# Patient Record
Sex: Male | Born: 1945
Health system: Southern US, Community
[De-identification: ages and names within clinical notes are randomized; demographics above are authoritative.]

## PROBLEM LIST (undated history)

## (undated) DIAGNOSIS — N1831 Chronic kidney disease, stage 3a: Secondary | ICD-10-CM

## (undated) DIAGNOSIS — E119 Type 2 diabetes mellitus without complications: Secondary | ICD-10-CM

## (undated) DIAGNOSIS — T7840XA Allergy, unspecified, initial encounter: Secondary | ICD-10-CM

## (undated) DIAGNOSIS — L719 Rosacea, unspecified: Secondary | ICD-10-CM

## (undated) DIAGNOSIS — K635 Polyp of colon: Secondary | ICD-10-CM

## (undated) DIAGNOSIS — Z85828 Personal history of other malignant neoplasm of skin: Secondary | ICD-10-CM

## (undated) DIAGNOSIS — I509 Heart failure, unspecified: Secondary | ICD-10-CM

## (undated) DIAGNOSIS — E785 Hyperlipidemia, unspecified: Secondary | ICD-10-CM

## (undated) DIAGNOSIS — E669 Obesity, unspecified: Secondary | ICD-10-CM

## (undated) DIAGNOSIS — Z8589 Personal history of malignant neoplasm of other organs and systems: Secondary | ICD-10-CM

## (undated) DIAGNOSIS — R000 Tachycardia, unspecified: Secondary | ICD-10-CM

## (undated) DIAGNOSIS — I10 Essential (primary) hypertension: Secondary | ICD-10-CM

## (undated) DIAGNOSIS — K219 Gastro-esophageal reflux disease without esophagitis: Secondary | ICD-10-CM

## (undated) DIAGNOSIS — M1990 Unspecified osteoarthritis, unspecified site: Secondary | ICD-10-CM

## (undated) HISTORY — DX: Tachycardia, unspecified: R00.0

## (undated) HISTORY — PX: KNEE ARTHROSCOPY: SUR90

## (undated) HISTORY — DX: Type 2 diabetes mellitus without complications: E11.9

## (undated) HISTORY — DX: Personal history of malignant neoplasm of other organs and systems: Z85.89

## (undated) HISTORY — DX: Rosacea, unspecified: L71.9

## (undated) HISTORY — DX: Polyp of colon: K63.5

## (undated) HISTORY — DX: Essential (primary) hypertension: I10

## (undated) HISTORY — DX: Hyperlipidemia, unspecified: E78.5

## (undated) HISTORY — PX: TONSILLECTOMY: SUR1361

## (undated) HISTORY — PX: PILONIDAL CYST EXCISION: SHX744

## (undated) HISTORY — PX: TOTAL KNEE ARTHROPLASTY: SHX125

## (undated) HISTORY — DX: Personal history of other malignant neoplasm of skin: Z85.828

## (undated) HISTORY — PX: REPLACEMENT TOTAL KNEE: SUR1224

## (undated) HISTORY — DX: Allergy, unspecified, initial encounter: T78.40XA

## (undated) HISTORY — PX: OTHER SURGICAL HISTORY: SHX169

## (undated) HISTORY — DX: Gastro-esophageal reflux disease without esophagitis: K21.9

## (undated) HISTORY — PX: SHOULDER SURGERY: SHX246

---

## 1999-06-23 ENCOUNTER — Inpatient Hospital Stay (HOSPITAL_COMMUNITY): Admission: EM | Admit: 1999-06-23 | Discharge: 1999-06-25 | Payer: Self-pay | Admitting: *Deleted

## 2001-06-19 ENCOUNTER — Encounter: Payer: Self-pay | Admitting: Internal Medicine

## 2001-06-19 ENCOUNTER — Ambulatory Visit (HOSPITAL_COMMUNITY): Admission: RE | Admit: 2001-06-19 | Discharge: 2001-06-19 | Payer: Self-pay | Admitting: Internal Medicine

## 2001-07-05 ENCOUNTER — Ambulatory Visit (HOSPITAL_BASED_OUTPATIENT_CLINIC_OR_DEPARTMENT_OTHER): Admission: RE | Admit: 2001-07-05 | Discharge: 2001-07-05 | Payer: Self-pay | Admitting: Orthopedic Surgery

## 2003-03-15 ENCOUNTER — Encounter: Admission: RE | Admit: 2003-03-15 | Discharge: 2003-03-15 | Payer: Self-pay | Admitting: Orthopedic Surgery

## 2004-02-10 ENCOUNTER — Ambulatory Visit: Payer: Self-pay | Admitting: Internal Medicine

## 2004-10-18 ENCOUNTER — Ambulatory Visit: Payer: Self-pay | Admitting: Internal Medicine

## 2004-10-25 ENCOUNTER — Ambulatory Visit: Payer: Self-pay | Admitting: Internal Medicine

## 2005-02-10 ENCOUNTER — Ambulatory Visit: Payer: Self-pay | Admitting: Internal Medicine

## 2005-03-17 ENCOUNTER — Ambulatory Visit: Payer: Self-pay | Admitting: Internal Medicine

## 2006-01-02 ENCOUNTER — Ambulatory Visit: Payer: Self-pay | Admitting: Internal Medicine

## 2006-03-12 ENCOUNTER — Ambulatory Visit: Payer: Self-pay | Admitting: Internal Medicine

## 2006-09-18 ENCOUNTER — Telehealth: Payer: Self-pay | Admitting: Internal Medicine

## 2007-02-05 DIAGNOSIS — E8881 Metabolic syndrome: Secondary | ICD-10-CM

## 2007-02-05 DIAGNOSIS — M109 Gout, unspecified: Secondary | ICD-10-CM

## 2007-02-05 DIAGNOSIS — Z9089 Acquired absence of other organs: Secondary | ICD-10-CM | POA: Insufficient documentation

## 2007-02-05 DIAGNOSIS — Z981 Arthrodesis status: Secondary | ICD-10-CM

## 2007-02-05 DIAGNOSIS — S99929A Unspecified injury of unspecified foot, initial encounter: Secondary | ICD-10-CM

## 2007-02-05 DIAGNOSIS — R079 Chest pain, unspecified: Secondary | ICD-10-CM

## 2007-02-05 DIAGNOSIS — K219 Gastro-esophageal reflux disease without esophagitis: Secondary | ICD-10-CM

## 2007-02-05 DIAGNOSIS — S99919A Unspecified injury of unspecified ankle, initial encounter: Secondary | ICD-10-CM

## 2007-02-05 DIAGNOSIS — S8990XA Unspecified injury of unspecified lower leg, initial encounter: Secondary | ICD-10-CM | POA: Insufficient documentation

## 2007-02-05 DIAGNOSIS — E860 Dehydration: Secondary | ICD-10-CM | POA: Insufficient documentation

## 2007-02-13 ENCOUNTER — Telehealth (INDEPENDENT_AMBULATORY_CARE_PROVIDER_SITE_OTHER): Payer: Self-pay | Admitting: *Deleted

## 2007-03-01 ENCOUNTER — Ambulatory Visit: Payer: Self-pay | Admitting: Internal Medicine

## 2007-03-01 DIAGNOSIS — M7660 Achilles tendinitis, unspecified leg: Secondary | ICD-10-CM

## 2007-03-05 ENCOUNTER — Telehealth (INDEPENDENT_AMBULATORY_CARE_PROVIDER_SITE_OTHER): Payer: Self-pay | Admitting: *Deleted

## 2007-03-05 ENCOUNTER — Encounter (INDEPENDENT_AMBULATORY_CARE_PROVIDER_SITE_OTHER): Payer: Self-pay | Admitting: *Deleted

## 2007-07-12 ENCOUNTER — Telehealth: Payer: Self-pay | Admitting: Internal Medicine

## 2007-07-12 ENCOUNTER — Encounter: Payer: Self-pay | Admitting: Internal Medicine

## 2007-07-12 ENCOUNTER — Ambulatory Visit: Payer: Self-pay | Admitting: Internal Medicine

## 2007-07-26 ENCOUNTER — Encounter: Payer: Self-pay | Admitting: Internal Medicine

## 2007-07-26 ENCOUNTER — Ambulatory Visit: Payer: Self-pay | Admitting: Internal Medicine

## 2007-07-26 LAB — HM COLONOSCOPY: HM Colonoscopy: NORMAL

## 2007-07-29 ENCOUNTER — Encounter: Payer: Self-pay | Admitting: Internal Medicine

## 2007-08-08 ENCOUNTER — Encounter: Payer: Self-pay | Admitting: Internal Medicine

## 2007-09-02 ENCOUNTER — Telehealth (INDEPENDENT_AMBULATORY_CARE_PROVIDER_SITE_OTHER): Payer: Self-pay | Admitting: *Deleted

## 2007-10-02 ENCOUNTER — Ambulatory Visit: Payer: Self-pay | Admitting: Internal Medicine

## 2007-10-02 DIAGNOSIS — T300 Burn of unspecified body region, unspecified degree: Secondary | ICD-10-CM | POA: Insufficient documentation

## 2007-10-02 DIAGNOSIS — M545 Low back pain: Secondary | ICD-10-CM | POA: Insufficient documentation

## 2007-10-08 ENCOUNTER — Encounter: Payer: Self-pay | Admitting: Internal Medicine

## 2007-11-05 ENCOUNTER — Ambulatory Visit: Payer: Self-pay | Admitting: Internal Medicine

## 2007-11-05 DIAGNOSIS — Z85828 Personal history of other malignant neoplasm of skin: Secondary | ICD-10-CM

## 2007-11-05 DIAGNOSIS — E785 Hyperlipidemia, unspecified: Secondary | ICD-10-CM | POA: Insufficient documentation

## 2007-11-05 DIAGNOSIS — R740 Nonspecific elevation of levels of transaminase and lactic acid dehydrogenase [LDH]: Secondary | ICD-10-CM

## 2007-11-12 ENCOUNTER — Encounter (INDEPENDENT_AMBULATORY_CARE_PROVIDER_SITE_OTHER): Payer: Self-pay | Admitting: *Deleted

## 2007-11-13 ENCOUNTER — Encounter: Payer: Self-pay | Admitting: Internal Medicine

## 2007-12-13 ENCOUNTER — Telehealth (INDEPENDENT_AMBULATORY_CARE_PROVIDER_SITE_OTHER): Payer: Self-pay | Admitting: *Deleted

## 2007-12-16 ENCOUNTER — Telehealth: Payer: Self-pay | Admitting: Internal Medicine

## 2008-01-13 ENCOUNTER — Encounter: Payer: Self-pay | Admitting: Internal Medicine

## 2008-01-14 ENCOUNTER — Encounter: Payer: Self-pay | Admitting: Internal Medicine

## 2008-02-29 ENCOUNTER — Encounter: Payer: Self-pay | Admitting: Internal Medicine

## 2008-03-11 ENCOUNTER — Telehealth (INDEPENDENT_AMBULATORY_CARE_PROVIDER_SITE_OTHER): Payer: Self-pay | Admitting: *Deleted

## 2008-03-16 ENCOUNTER — Encounter: Payer: Self-pay | Admitting: Internal Medicine

## 2008-04-17 ENCOUNTER — Encounter: Payer: Self-pay | Admitting: Internal Medicine

## 2008-04-20 ENCOUNTER — Encounter (INDEPENDENT_AMBULATORY_CARE_PROVIDER_SITE_OTHER): Payer: Self-pay | Admitting: *Deleted

## 2008-04-20 ENCOUNTER — Telehealth: Payer: Self-pay | Admitting: Internal Medicine

## 2008-04-23 ENCOUNTER — Encounter: Admission: RE | Admit: 2008-04-23 | Discharge: 2008-04-23 | Payer: Self-pay | Admitting: Internal Medicine

## 2008-04-23 ENCOUNTER — Telehealth (INDEPENDENT_AMBULATORY_CARE_PROVIDER_SITE_OTHER): Payer: Self-pay | Admitting: *Deleted

## 2008-04-23 ENCOUNTER — Encounter (INDEPENDENT_AMBULATORY_CARE_PROVIDER_SITE_OTHER): Payer: Self-pay | Admitting: *Deleted

## 2008-05-01 ENCOUNTER — Ambulatory Visit: Payer: Self-pay | Admitting: Internal Medicine

## 2008-05-01 ENCOUNTER — Inpatient Hospital Stay (HOSPITAL_COMMUNITY): Admission: RE | Admit: 2008-05-01 | Discharge: 2008-05-06 | Payer: Self-pay | Admitting: Orthopedic Surgery

## 2008-05-03 ENCOUNTER — Encounter: Payer: Self-pay | Admitting: Internal Medicine

## 2008-05-03 ENCOUNTER — Encounter: Payer: Self-pay | Admitting: Endocrinology

## 2008-05-03 DIAGNOSIS — E871 Hypo-osmolality and hyponatremia: Secondary | ICD-10-CM

## 2008-05-03 DIAGNOSIS — N259 Disorder resulting from impaired renal tubular function, unspecified: Secondary | ICD-10-CM | POA: Insufficient documentation

## 2008-05-05 ENCOUNTER — Encounter (INDEPENDENT_AMBULATORY_CARE_PROVIDER_SITE_OTHER): Payer: Self-pay | Admitting: Orthopedic Surgery

## 2008-05-05 ENCOUNTER — Encounter: Payer: Self-pay | Admitting: Internal Medicine

## 2008-05-12 ENCOUNTER — Ambulatory Visit (HOSPITAL_COMMUNITY): Admission: RE | Admit: 2008-05-12 | Discharge: 2008-05-12 | Payer: Self-pay | Admitting: Orthopedic Surgery

## 2008-05-12 ENCOUNTER — Encounter (INDEPENDENT_AMBULATORY_CARE_PROVIDER_SITE_OTHER): Payer: Self-pay | Admitting: Orthopedic Surgery

## 2008-05-12 ENCOUNTER — Ambulatory Visit: Payer: Self-pay | Admitting: Surgery

## 2008-05-22 ENCOUNTER — Telehealth (INDEPENDENT_AMBULATORY_CARE_PROVIDER_SITE_OTHER): Payer: Self-pay | Admitting: *Deleted

## 2008-05-25 ENCOUNTER — Emergency Department (HOSPITAL_COMMUNITY): Admission: EM | Admit: 2008-05-25 | Discharge: 2008-05-25 | Payer: Self-pay | Admitting: Family Medicine

## 2008-05-25 ENCOUNTER — Ambulatory Visit: Payer: Self-pay | Admitting: Internal Medicine

## 2008-05-25 DIAGNOSIS — D649 Anemia, unspecified: Secondary | ICD-10-CM | POA: Insufficient documentation

## 2008-05-25 DIAGNOSIS — L509 Urticaria, unspecified: Secondary | ICD-10-CM

## 2008-05-26 ENCOUNTER — Emergency Department (HOSPITAL_COMMUNITY): Admission: EM | Admit: 2008-05-26 | Discharge: 2008-05-26 | Payer: Self-pay | Admitting: Emergency Medicine

## 2008-05-26 ENCOUNTER — Telehealth (INDEPENDENT_AMBULATORY_CARE_PROVIDER_SITE_OTHER): Payer: Self-pay | Admitting: *Deleted

## 2008-05-26 ENCOUNTER — Telehealth: Payer: Self-pay | Admitting: Family Medicine

## 2008-05-27 ENCOUNTER — Ambulatory Visit: Payer: Self-pay | Admitting: Internal Medicine

## 2008-05-28 ENCOUNTER — Telehealth (INDEPENDENT_AMBULATORY_CARE_PROVIDER_SITE_OTHER): Payer: Self-pay | Admitting: *Deleted

## 2008-06-06 ENCOUNTER — Telehealth: Payer: Self-pay | Admitting: Family Medicine

## 2008-06-08 ENCOUNTER — Ambulatory Visit: Payer: Self-pay | Admitting: Internal Medicine

## 2008-06-08 DIAGNOSIS — R000 Tachycardia, unspecified: Secondary | ICD-10-CM

## 2008-06-08 DIAGNOSIS — T783XXA Angioneurotic edema, initial encounter: Secondary | ICD-10-CM | POA: Insufficient documentation

## 2008-06-09 ENCOUNTER — Encounter: Payer: Self-pay | Admitting: Internal Medicine

## 2008-06-09 ENCOUNTER — Observation Stay (HOSPITAL_COMMUNITY): Admission: EM | Admit: 2008-06-09 | Discharge: 2008-06-10 | Payer: Self-pay | Admitting: *Deleted

## 2008-06-09 ENCOUNTER — Ambulatory Visit: Payer: Self-pay | Admitting: Internal Medicine

## 2008-06-10 ENCOUNTER — Encounter: Payer: Self-pay | Admitting: Cardiology

## 2008-06-11 ENCOUNTER — Ambulatory Visit: Payer: Self-pay | Admitting: Internal Medicine

## 2008-06-11 LAB — CONVERTED CEMR LAB
Rhuematoid fact SerPl-aCnc: <20 intl units/mL (ref 0.0–20.0)
Uric Acid, Serum: 6 mg/dL (ref 4.0–7.8)

## 2008-06-12 ENCOUNTER — Encounter (INDEPENDENT_AMBULATORY_CARE_PROVIDER_SITE_OTHER): Payer: Self-pay | Admitting: *Deleted

## 2008-06-14 LAB — CONVERTED CEMR LAB
Anti Nuclear Antibody(ANA): NEGATIVE
Compl, Total (CH50): >60 (ref 31–66)

## 2008-06-15 ENCOUNTER — Encounter (INDEPENDENT_AMBULATORY_CARE_PROVIDER_SITE_OTHER): Payer: Self-pay | Admitting: *Deleted

## 2008-06-24 ENCOUNTER — Ambulatory Visit: Payer: Self-pay | Admitting: Internal Medicine

## 2008-06-26 ENCOUNTER — Encounter (INDEPENDENT_AMBULATORY_CARE_PROVIDER_SITE_OTHER): Payer: Self-pay | Admitting: *Deleted

## 2008-06-26 LAB — CONVERTED CEMR LAB
BUN: 11 mg/dL (ref 6–23)
Basophils Absolute: 0 10*3/uL (ref 0.0–0.1)
Eosinophils Absolute: 0.3 10*3/uL (ref 0.0–0.7)
HCT: 37.8 % — ABNORMAL LOW (ref 39.0–52.0)
Lymphocytes Relative: 47 % — ABNORMAL HIGH (ref 12.0–46.0)
Lymphs Abs: 2.6 10*3/uL (ref 0.7–4.0)
Monocytes Relative: 11.7 % (ref 3.0–12.0)
Platelets: 229 10*3/uL (ref 150.0–400.0)
RDW: 13.8 % (ref 11.5–14.6)

## 2008-06-29 ENCOUNTER — Telehealth (INDEPENDENT_AMBULATORY_CARE_PROVIDER_SITE_OTHER): Payer: Self-pay | Admitting: *Deleted

## 2008-06-30 ENCOUNTER — Encounter: Payer: Self-pay | Admitting: Internal Medicine

## 2008-07-02 ENCOUNTER — Ambulatory Visit: Payer: Self-pay | Admitting: Internal Medicine

## 2008-07-02 DIAGNOSIS — I10 Essential (primary) hypertension: Secondary | ICD-10-CM | POA: Insufficient documentation

## 2008-07-03 ENCOUNTER — Telehealth: Payer: Self-pay | Admitting: Internal Medicine

## 2008-07-03 ENCOUNTER — Telehealth (INDEPENDENT_AMBULATORY_CARE_PROVIDER_SITE_OTHER): Payer: Self-pay | Admitting: *Deleted

## 2008-07-06 ENCOUNTER — Encounter: Payer: Self-pay | Admitting: Internal Medicine

## 2008-07-06 ENCOUNTER — Encounter: Payer: Self-pay | Admitting: Cardiology

## 2008-07-08 ENCOUNTER — Encounter: Payer: Self-pay | Admitting: Internal Medicine

## 2008-07-14 ENCOUNTER — Telehealth (INDEPENDENT_AMBULATORY_CARE_PROVIDER_SITE_OTHER): Payer: Self-pay | Admitting: Radiology

## 2008-07-15 ENCOUNTER — Ambulatory Visit: Payer: Self-pay

## 2008-07-16 ENCOUNTER — Encounter: Payer: Self-pay | Admitting: Internal Medicine

## 2008-07-17 ENCOUNTER — Ambulatory Visit: Payer: Self-pay | Admitting: Internal Medicine

## 2008-07-17 DIAGNOSIS — K7689 Other specified diseases of liver: Secondary | ICD-10-CM

## 2008-07-17 DIAGNOSIS — R1310 Dysphagia, unspecified: Secondary | ICD-10-CM

## 2008-07-17 DIAGNOSIS — K222 Esophageal obstruction: Secondary | ICD-10-CM | POA: Insufficient documentation

## 2008-07-20 ENCOUNTER — Ambulatory Visit: Payer: Self-pay | Admitting: Internal Medicine

## 2008-07-21 ENCOUNTER — Encounter: Payer: Self-pay | Admitting: Internal Medicine

## 2008-08-05 ENCOUNTER — Telehealth: Payer: Self-pay | Admitting: Internal Medicine

## 2008-08-10 ENCOUNTER — Ambulatory Visit: Payer: Self-pay | Admitting: Internal Medicine

## 2008-08-13 ENCOUNTER — Encounter (INDEPENDENT_AMBULATORY_CARE_PROVIDER_SITE_OTHER): Payer: Self-pay | Admitting: *Deleted

## 2008-08-13 LAB — CONVERTED CEMR LAB
Basophils Absolute: 0.1 10*3/uL (ref 0.0–0.1)
Eosinophils Absolute: 0.1 10*3/uL (ref 0.0–0.7)
Eosinophils Relative: 2.3 % (ref 0.0–5.0)
MCV: 90.8 fL (ref 78.0–100.0)
Monocytes Absolute: 0.8 10*3/uL (ref 0.1–1.0)
Neutrophils Relative %: 32.4 % — ABNORMAL LOW (ref 43.0–77.0)
Platelets: 212 10*3/uL (ref 150.0–400.0)
RDW: 15.4 % — ABNORMAL HIGH (ref 11.5–14.6)
WBC: 5.7 10*3/uL (ref 4.5–10.5)

## 2008-09-02 ENCOUNTER — Telehealth: Payer: Self-pay | Admitting: Internal Medicine

## 2008-09-03 ENCOUNTER — Encounter: Payer: Self-pay | Admitting: Internal Medicine

## 2008-09-10 ENCOUNTER — Encounter: Payer: Self-pay | Admitting: Internal Medicine

## 2008-11-30 ENCOUNTER — Telehealth: Payer: Self-pay | Admitting: Internal Medicine

## 2009-01-19 ENCOUNTER — Encounter: Payer: Self-pay | Admitting: Internal Medicine

## 2009-05-10 ENCOUNTER — Encounter: Payer: Self-pay | Admitting: Internal Medicine

## 2009-05-17 ENCOUNTER — Telehealth (INDEPENDENT_AMBULATORY_CARE_PROVIDER_SITE_OTHER): Payer: Self-pay | Admitting: *Deleted

## 2009-05-17 ENCOUNTER — Ambulatory Visit: Payer: Self-pay | Admitting: Internal Medicine

## 2009-07-15 ENCOUNTER — Encounter: Payer: Self-pay | Admitting: Internal Medicine

## 2009-07-20 ENCOUNTER — Encounter: Payer: Self-pay | Admitting: Internal Medicine

## 2009-08-05 ENCOUNTER — Ambulatory Visit: Payer: Self-pay | Admitting: Internal Medicine

## 2009-08-05 DIAGNOSIS — R404 Transient alteration of awareness: Secondary | ICD-10-CM

## 2009-08-09 ENCOUNTER — Telehealth (INDEPENDENT_AMBULATORY_CARE_PROVIDER_SITE_OTHER): Payer: Self-pay | Admitting: *Deleted

## 2009-08-09 LAB — CONVERTED CEMR LAB: Hgb A1c MFr Bld: 6.6 % — ABNORMAL HIGH (ref 4.6–6.5)

## 2009-12-04 ENCOUNTER — Emergency Department (HOSPITAL_BASED_OUTPATIENT_CLINIC_OR_DEPARTMENT_OTHER): Admission: EM | Admit: 2009-12-04 | Discharge: 2009-12-05 | Payer: Self-pay | Admitting: Emergency Medicine

## 2009-12-07 ENCOUNTER — Ambulatory Visit: Payer: Self-pay | Admitting: Internal Medicine

## 2009-12-07 ENCOUNTER — Encounter: Payer: Self-pay | Admitting: Internal Medicine

## 2009-12-08 ENCOUNTER — Encounter: Payer: Self-pay | Admitting: Internal Medicine

## 2009-12-09 ENCOUNTER — Telehealth: Payer: Self-pay | Admitting: Internal Medicine

## 2009-12-16 ENCOUNTER — Encounter: Payer: Self-pay | Admitting: Internal Medicine

## 2010-03-06 ENCOUNTER — Encounter: Payer: Self-pay | Admitting: Internal Medicine

## 2010-03-13 LAB — CONVERTED CEMR LAB
ALT: 97 units/L — ABNORMAL HIGH (ref 0–53)
Alkaline Phosphatase: 83 units/L (ref 39–117)
Bilirubin, Direct: 0.2 mg/dL (ref 0.0–0.3)
Total Protein: 7.4 g/dL (ref 6.0–8.3)

## 2010-03-15 NOTE — Progress Notes (Signed)
Summary: Lab results  Phone Note Call from Patient   Caller: Patient Summary of Call: Spoke with patient about recent labs: I reviewed your chart; HCTZ should be stopped altogether for 3 reasons: 1) HCTZ high on list to cause Urticaria, 2) HCTZ can raise uric acid & acute gout risk & 3) it may also  raise sugar. Check uric acid level with followup labs  & see me 2-3 days later. Monitor BOP ooff HCTZ; goal = AVERAGE < 135/85. Hopp (uric acid; 274.9,995.20,790.6)   Patient aware copy of labs to be mailed, patient said his rheumatologist was satisified with his recent Uric Acid level and did not think he needed to f/u on it, so he is going to go with with they said about that. Patient didnt feel it was necessary to recheck Uric Acid level at this time. patient ok'd all other instruction and request rx for metformin be sent to [facility] (done).Shonna Chock  August 09, 2009 12:23 PM

## 2010-03-15 NOTE — Assessment & Plan Note (Signed)
Summary: cpx/fasting//kn   Vital Signs:  Patient profile:   65 year old male Weight:      302.8 pounds BMI:     44.88 Temp:     98.8 degrees F oral Pulse rate:   84 / minute Resp:     15 per minute BP sitting:   130 / 80  (left arm) Cuff size:   large  Vitals Entered By: Shonna Chock CMA (December 07, 2009 9:53 AM) CC: CPX with fasting labs , Heartburn Comments Patient refused flu vaccine [facility] to [facility]   Primary Care Provider:  Marga Melnick  CC:  CPX with fasting labs  and Heartburn.  History of Present Illness: Here for Medicare AWV: 1.Risk factors based on Past M, S, F history:ERD; hyperuricemia/ gout; HTN; Metabolic Syndrome( chart updated) 2.Physical Activities: no exercise since shoulder surgery 3.Depression/mood: no issues 4.Hearing: whisper heard @ 6 ft 5.ADL's: no limitations 6.Fall Risk: staggers occasionally due to R TKR  7.Home Safety: smoke alarms in place; no significant safety issues 8.Height, weight, &visual acuity:wall chart read @ 6 ft with lenses 9.Counseling:POA & Living Will in place  10.Labs ordered based on risk factors:see Orders  11.Referral Coordination: none  requested 12. Care Plan: see Instructions 13.Cognitive Assessment: Oriented X 3; memory & recall  excellent  ; "WORLD" spelled backwards;mood & affect normal. ERD      This is a 65 year old man who  has  PMH of ERD .  The patient reports weight gain of 20#, but denies acid reflux, sour taste in mouth, epigastric pain, chest pain, and trouble swallowing.  The patient reports the following alarm features of dyspepsia: dysphagia( if I don't drink water with meals).  The patient denies the following alarm features: melena, hematemesis, and vomiting.  Symptoms are worse with spicy foods.  Prior evaluation has included EGD with dilation in 2009.  The patient has found the following treatments to be effective: a PPI.  Hypertension Follow-Up      The  patient also presents for Hypertension follow-up.  The patient reports urinary frequency ( with UTI treated @ UC 10/22), but denies lightheadedness, headaches, and fatigue.  Associated symptoms include dyspnea( due to deconditioning), leg edema, and pedal edema.  The patient denies the following associated symptoms: palpitations and syncope.  BP same range as today usually.  Preventive Screening-Counseling & Management  Alcohol-Tobacco     Alcohol drinks/day: 0     Smoking Status: quit > 6 months     Packs/Day: up to 3 ppd     Year Quit: 1975  Caffeine-Diet-Exercise     Caffeine use/day: 2 coffee, 1 tea, rare cola     Diet Comments: none now  Hep-HIV-STD-Contraception     Dental Visit-last 6 months no     Sun Exposure-Excessive: no  Safety-Violence-Falls     Seat Belt Use: yes     Firearms in the Home: firearms in the home     Firearm Counseling: not indicated; uses recommended firearm safety measures      Blood Transfusions:  no.        Travel History:  [place]  2009.    Allergies: 1)  ! Codeine 2)  ! Wellbutrin 3)  ! Zoloft 4)  ! Lexapro (Escitalopram Oxalate) 5)  ! * Metoprolol Succinate  Past History:  Past Medical History: GERD Gout 2003 in [place] ,seen in ER hospitalization for  chest pain,acute 2000, negative  cath hospitalization  for  dehydration 1964 Skin cancer,PMH  of, Basal Cell  & Squamous Cell Hyperlipidemia @ [facility] in  2009 Tachycardia March 2010  Uritcaria post TKR April  2010  Past Surgical History: Tonsillectomy Pilonidal cystectomy  Dysmetabolic syndrome( "pre Diabetes") Total knee replacement & arthroscopy L; 3 arthroscopies R knee Colon polypectomy 2003, 2009 Endo2003,2009 : esophagus  dilated Total knee replacement (04/22/2008)-right  Family History: Father: d 62 RHD Mother: lung cancer d 59 Siblings: bro: d in 28s, CAD;PGM: CAD; M aunt : lung cancer  Social History: Married Alcohol use-no Retired Actor x 5 Smoking  Status:  quit > 6 months Packs/Day:  up to 3 ppd Caffeine use/day:  2 coffee, 1 tea, rare cola Dental Care w/in 6 mos.:  no Sun Exposure-Excessive:  no Seat Belt Use:  yes Blood Transfusions:  no  Review of Systems       The patient complains of prolonged cough and angioedema.  The patient denies anorexia, fever, hemoptysis, suspicious skin lesions, unusual weight change, abnormal bleeding, and enlarged lymph nodes.         NP cough several weeks, worse  in am , resolves through the day. Hematuria with UTI 10/22. Doxepin as needed for intermittent angioedema,Rxed by Dermatologist.  Physical Exam  General:  in no acute distress; alert,appropriate and cooperative throughout examination Head:  Normocephalic and atraumatic without obvious abnormalities. No apparent alopecia or balding. Eyes:  No corneal or conjunctival inflammation noted. Perrla. Funduscopic exam benign, without hemorrhages, exudates or papilledema. Ears:  External ear exam shows no significant lesions or deformities.  Otoscopic examination reveals clear canals, tympanic membranes are intact bilaterally without bulging, retraction, inflammation or discharge. Hearing is grossly normal bilaterally. Nose:  External nasal examination shows no deformity or inflammation. Nasal mucosa are pink and moist without lesions or exudates. Mouth:  Oral mucosa and oropharynx without lesions or exudates.  Teeth in good repair. Oropharyngeal crowding (neg workup for Sleep Apnea) Neck:  No deformities, masses, or tenderness noted. Lungs:  Normal respiratory effort, chest expands symmetrically. Lungs are clear to auscultation, no crackles or wheezes. Heart:  Normal rate and regular rhythm. S1 and S2 normal without gallop, murmur, click, rub or other extra sounds. Abdomen:  Bowel sounds positive,abdomen soft and non-tender without masses, organomegaly or hernias noted. Dullness to percussion RUQ Genitalia:  @ UC 10/22 Prostate:  @ UC by PA Msk:   No deformity or scoliosis noted of thoracic or lumbar spine.   Pulses:  R and L carotid,radial,dorsalis pedis and posterior tibial pulses are full and equal bilaterally Extremities:  No clubbing, cyanosis. Crepitus L knee > R.Trace edma Neurologic:  alert & oriented X3 and DTRs symmetrical but 0+ @ knees Skin:  Mild Rosacea over chin Cervical Nodes:  No lymphadenopathy noted Axillary Nodes:  No palpable lymphadenopathy Psych:  memory intact for recent and remote, normally interactive, and good eye contact.     Impression & Recommendations:  Problem # 1:  PREVENTIVE HEALTH CARE (ICD-V70.0)  Orders: Welcome to [facility], Physical [phone])  Problem # 2:  GERD (ICD-530.81)  His updated medication list for this problem includes:    Omeprazole 20 Mg Cpdr (Omeprazole) .Marland Kitchen... Take 1 p.o. b.i.d.  Problem # 3:  COUGH (ICD-786.2)  secondary to #2  Orders: T-2 View CXR (71020TC)  Problem # 4:  UTI (ICD-599.0) C&S pending  Problem # 5:  DYSPHAGIA UNSPECIFIED (ICD-787.20) due to #2; risks discussed  Problem # 6:  ESSENTIAL HYPERTENSION, BENIGN (ICD-401.1)  controlled His updated medication list for  this problem includes:    Doxazosin Mesylate 4 Mg Tabs (Doxazosin mesylate) .Marland Kitchen... 1 by mouth qd    Metoprolol Tartrate 25 Mg Tabs (Metoprolol tartrate) .Marland Kitchen... 1/2 by mouth once daily  Orders: EKG w/ Interpretation (93000)  Problem # 7:  DYSMETABOLIC SYNDROME X (ICD-277.7)  Complete Medication List: 1)  Doxazosin Mesylate 4 Mg Tabs (Doxazosin mesylate) .Marland Kitchen.. 1 by mouth qd 2)  Viagra 100 Mg Tabs (Sildenafil citrate) .... 1/2 -1 once daily as needed 3)  Cyclobenzaprine Hcl 5 Mg Tabs (Cyclobenzaprine hcl) .... Take 1 tablet by mouth at bedtime as needed 4)  Metoprolol Tartrate 25 Mg Tabs (Metoprolol tartrate) .... 1/2 by mouth once daily 5)  Multivitamins Tabs (Multiple vitamin) .Marland Kitchen.. 1 by mouth once daily 6)  Calcium 600 1500 Mg Tabs (Calcium carbonate) .Marland Kitchen.. 1 by mouth once daily 7)  Fish  Oil 1000 Mg Caps (Omega-3 fatty acids) .Marland Kitchen.. 1 by mouth once daily 8)  Stool Softner  .... As needed 9)  Zyrtec Allergy 10 Mg Tabs (Cetirizine hcl) .Marland Kitchen.. 1 tablet once daily 10)  Allopurinol 300 Mg Tabs (Allopurinol) .Marland Kitchen.. 1 by mouth once daily 11)  Omeprazole 20 Mg Cpdr (Omeprazole) .... Take 1 p.o. b.i.d. 12)  Doxepin Hcl 10 Mg Caps (Doxepin hcl) .Marland Kitchen.. 1 by mouth once daily 13)  Mobic 7.5 Mg Tabs (Meloxicam) .Marland Kitchen.. 1 by mouth once daily 14)  Hydrocodone-? Dose  15)  Metformin Hcl 500 Mg Xr24h-tab (Metformin hcl) .Marland Kitchen.. 1 once daily with largest meal  Patient Instructions: 1)  The following labs are recommended:uric acid; 2)  BMP; 3)  Hepatic Panel ; 4)  Lipid Panel; 5)  TSH ; 6)  CBC w/ Diff; 7)  PSA; 8)  HbgA1C. 9)  Avoid foods high in acid (tomatoes, citrus juices, spicy foods). Avoid eating within two hours of lying down or before exercising. Do not over eat; try smaller more frequent meals. Elevate head of bed twelve inches when sleeping. Report dysphagia if recurrent.   Orders Added: 1)  Welcome to Medicare, Physical [G0402] 2)  Est. Patient Level III [14782] 3)  EKG w/ Interpretation [93000] 4)  T-2 View CXR [71020TC]

## 2010-03-15 NOTE — Assessment & Plan Note (Signed)
Summary: sleeping too much/cbs   Vital Signs:  Patient profile:   65 year old male Weight:      308 pounds Temp:     98.2 degrees F oral Pulse rate:   76 / minute Resp:     16 per minute BP sitting:   124 / 78  (left arm) Cuff size:   large  Vitals Entered By: Shonna Chock (August 05, 2009 2:02 PM) CC: Sleep concerns Comments REVIEWED MED LIST, PATIENT AGREED DOSE AND INSTRUCTION CORRECT    Primary Care Provider:  Marga Melnick  CC:  Sleep concerns.  History of Present Illness: His wife feels he excessively sleepy  in am since TKR in 04/2008. He takes hydrocodone & muscle relaxant qhs; Zyrtec  & Doxepin 10 mg in am. Sleep Apnea evaluation last year: minimal findings, no need for CPAP. No sleep disorder. Non fasting glucose was 131 @ Dr Fatima Sanger office.  Allergies: 1)  ! Codeine 2)  ! Wellbutrin 3)  ! Zoloft 4)  ! Lexapro (Escitalopram Oxalate) 5)  ! * Metoprolol Succinate  Review of Systems General:  Complains of fatigue. Eyes:  Denies blurring, double vision, and vision loss-both eyes. ENT:  Denies difficulty swallowing and hoarseness. CV:  Denies palpitations. Resp:  Denies excessive snoring and morning headaches. GI:  Denies constipation and diarrhea. Derm:  Denies changes in nail beds, dryness, and hair loss. Neuro:  Denies numbness and tingling. Endo:  Denies cold intolerance, excessive hunger, excessive thirst, excessive urination, and heat intolerance.  Physical Exam  General:  alert,appropriate and cooperative throughout examination Head:  Normocephalic and atraumatic without obvious abnormalities. No apparent alopecia  Eyes:  No corneal or conjunctival inflammation noted.No lid lag Mouth:  Oral mucosa and oropharynx without lesions or exudates. Visable oropharynx volume  lower limits of normal Heart:  Normal rate and regular rhythm. S1 and S2 normal without gallop, murmur, click, rub.S4 Neurologic:  DTRs symmetrical  but 0 + @ knees. No tremor  Skin:   Intact without suspicious lesions or rashes Psych:  memory intact for recent and remote and normally interactive.     Impression & Recommendations:  Problem # 1:  SLEEPINESS (ICD-780.09) neg Sleep Apnea W/U  Problem # 2:  HYPERGLYCEMIA (ICD-790.29)  Complete Medication List: 1)  Doxazosin Mesylate 4 Mg Tabs (Doxazosin mesylate) .Marland Kitchen.. 1 by mouth qd 2)  Viagra 100 Mg Tabs (Sildenafil citrate) .... 1/2 -1 once daily as needed 3)  Cyclobenzaprine Hcl 5 Mg Tabs (Cyclobenzaprine hcl) .... Take 1 tablet by mouth at bedtime as needed 4)  Metoprolol Tartrate 25 Mg Tabs (Metoprolol tartrate) .... 1/2 by mouth once daily 5)  Multivitamins Tabs (Multiple vitamin) .Marland Kitchen.. 1 by mouth once daily 6)  Calcium 600 1500 Mg Tabs (Calcium carbonate) .Marland Kitchen.. 1 by mouth once daily 7)  Fish Oil 1000 Mg Caps (Omega-3 fatty acids) .Marland Kitchen.. 1 by mouth once daily 8)  Stool Softner  .... As needed 9)  Zyrtec Allergy 10 Mg Tabs (Cetirizine hcl) .Marland Kitchen.. 1 tablet once daily 10)  Allopurinol 300 Mg Tabs (Allopurinol) .Marland Kitchen.. 1 by mouth once daily 11)  Hydrochlorothiazide 25 Mg Tabs (Hydrochlorothiazide) .... Take one tablet by mouth daily. 12)  Omeprazole 20 Mg Cpdr (Omeprazole) .... Take 1 p.o. b.i.d. 13)  Doxepin Hcl 10 Mg Caps (Doxepin hcl) .Marland Kitchen.. 1 by mouth once daily 14)  Mobic 7.5 Mg Tabs (Meloxicam) .Marland Kitchen.. 1 by mouth once daily 15)  Hydrocodone-? Dose   Patient Instructions: 1)  Stop muscle relaxant at bedtime . Change  Zyrtec to at bedtime   Appended Document: sleeping too much/cbs

## 2010-03-15 NOTE — Miscellaneous (Signed)
Summary: Orders Update   Clinical Lists Changes  Orders: Added new Service order of Venipuncture (36415) - Signed Added new Test order of TLB-A1C / Hgb A1C (Glycohemoglobin) (83036-A1C) - Signed 

## 2010-03-15 NOTE — Progress Notes (Signed)
Summary: DX concerns   Phone Note Call from Patient Call back at Home Phone [phone]   Caller: Spouse: Aurea Graff Summary of Call: [facility] [place] and the doctor there had a question about printout given to patient. ? Renal disease, patient not aware that he had kidney disease.  Dr.Lakayla Barrington please advise./Chrae Marlynn Perking CMA  December 09, 2009 10:12 AM   Follow-up for Phone Call        I recrded that he told me he had recently been found to have UTI . He needs to get C&S from [facility] facility where treated.They found blood in urine by history Follow-up by: Marga Melnick MD,  December 09, 2009 11:28 AM  Additional Follow-up for Phone Call Additional follow up Details #1::        Patient would like to know what the term ERD is and why it is in his chart.  Patient said the history of blood in his urine is an old dx and not present x a long time. Additional Follow-up by: Shonna Chock CMA,  December 09, 2009 11:41 AM    Additional Follow-up for Phone Call Additional follow up Details #2::    see ROS in CPX ; I recorded what he told me about seeing PA 10/22. He should verify the final results from the UC as to whether UTI present or not Follow-up by: Marga Melnick MD,  December 09, 2009 12:33 PM  Additional Follow-up for Phone Call Additional follow up Details #3:: Details for Additional Follow-up Action Taken: Pt is aware.  Additional Follow-up by: Army Fossa CMA,  December 09, 2009 2:23 PM

## 2010-03-15 NOTE — Letter (Signed)
Summary: [facility]  [facility]   Imported By: Lanelle Bal 05/18/2009 09:45:17  _____________________________________________________________________  External Attachment:    Type:   Image     Comment:   External Document

## 2010-03-15 NOTE — Assessment & Plan Note (Signed)
Summary: cellutiltis  elbow/alr pt aware working in /alr   Vital Signs:  Patient profile:   65 year old male Weight:      298 pounds BMI:     44.17 Temp:     98.3 degrees F oral Pulse rate:   80 / minute Resp:     17 per minute BP sitting:   130 / 84  (left arm) Cuff size:   large  Vitals Entered By: Shonna Chock (May 17, 2009 3:09 PM) CC: Cellulitis-Right Elbow Comments REVIEWED MED LIST, PATIENT AGREED DOSE AND INSTRUCTION CORRECT    Primary Care Provider:  Marga Melnick  CC:  Cellulitis-Right Elbow.  History of Present Illness: Pain & swelling RUE  from shouldr to elbow , both of which which improved with" steroid pack" completed 05/10/2009.Gout was diagnosed by Dr Fatima Sanger PA . Swelling has persisted @ elbow ; cellulitis diagnosed @ [facility] 04/03. Films normal. Keflex Rxed ; redness spreading but decreased temp in  RUE.  Allergies: 1)  ! Codeine 2)  ! Wellbutrin 3)  ! Zoloft 4)  ! Lexapro (Escitalopram Oxalate) 5)  ! * Metoprolol Succinate  Review of Systems General:  Denies chills, fever, and sweats. CV:  Denies chest pain or discomfort. Resp:  Denies shortness of breath. MS:  Complains of joint pain, joint redness, and joint swelling.  Physical Exam  General:  in no acute distress; appropriate and cooperative throughout examination; appears mildly uncomfortable-appearing.   Extremities:  Mild erythema & edema  @  R olecranon area with some blanching to direct pressure;tender to palpation; w/o increased temp  Skin:  Faint erythema R elbow/ forearm  essentially within ink outline from UC ;w/o increased temperature. Blanching over edema @ R olecranon process.  Cervical Nodes:  No lymphadenopathy noted Axillary Nodes:  No palpable lymphadenopathy; no epitrochlear LA    Impression & Recommendations:  Problem # 1:  CELLULITIS (ICD-682.9) Clinically improved;R/O underlying  gout  His updated medication list for this problem includes:    Keflex 500 Mg Caps  (Cephalexin) .Marland KitchenMarland KitchenMarland KitchenMarland Kitchen 4 x once daily x 9 days  Complete Medication List: 1)  Doxazosin Mesylate 4 Mg Tabs (Doxazosin mesylate) .Marland Kitchen.. 1 by mouth qd 2)  Viagra 100 Mg Tabs (Sildenafil citrate) .... 1/2 -1 once daily as needed 3)  Cyclobenzaprine Hcl 5 Mg Tabs (Cyclobenzaprine hcl) .... Take 1 tablet by mouth at bedtime as needed 4)  Metoprolol Tartrate 25 Mg Tabs (Metoprolol tartrate) .... Take one tablet two times a day 5)  Multivitamins Tabs (Multiple vitamin) .Marland Kitchen.. 1 by mouth once daily 6)  Calcium 600 1500 Mg Tabs (Calcium carbonate) .Marland Kitchen.. 1 by mouth once daily 7)  Fish Oil 1000 Mg Caps (Omega-3 fatty acids) .Marland Kitchen.. 1 by mouth once daily 8)  Stool Softner  .... As needed 9)  Oxycodone-acetaminophen 5-325 Mg Tabs (Oxycodone-acetaminophen) .Marland Kitchen.. 1-2 by mouth every four hours as needed 10)  Zyrtec Allergy 10 Mg Tabs (Cetirizine hcl) .Marland Kitchen.. 1 tablet once daily 11)  Allopurinol 300 Mg Tabs (Allopurinol) .Marland Kitchen.. 1 by mouth once daily 12)  Hydrochlorothiazide 25 Mg Tabs (Hydrochlorothiazide) .... Take one tablet by mouth daily. 13)  Omeprazole 20 Mg Cpdr (Omeprazole) .... Take 1 p.o. b.i.d. 14)  Doxepin Hcl 10 Mg Caps (Doxepin hcl) .Marland Kitchen.. 1 by mouth two times a day 15)  Keflex 500 Mg Caps (Cephalexin) .... 4 x once daily x 9 days  Patient Instructions: 1)  Monitor temp ; keep a diary of recordings.Elevate R arm as much as possible &  use warm , moist compresses three times a day as needed . Report Warning Signs  of fever , pain, increased swelling. Aspiration of joint if these appear.Complete full course of Keflex.

## 2010-03-15 NOTE — Letter (Signed)
Summary: [facility]  [facility]   Imported By: Lanelle Bal 07/30/2009 11:13:37  _____________________________________________________________________  External Attachment:    Type:   Image     Comment:   External Document

## 2010-03-15 NOTE — Progress Notes (Signed)
Summary: PHONE-CELLITUS  Phone Note Call from Patient Call back at Home Phone [phone]   Caller: Spouse Summary of Call: PATIENT WIFE STATES PATIENT HAS CELLIUTIS AND NEEDS TO BE SEEN TODAY. PATIENT WIFE SAID SHE SPOKE WITH DR. Kathyrn Sheriff AND SHE STATED HE NEEDS TO BE SEEN TODAY. PATIENT [facility] AND THEY SAID HE NEEDS TO BE SEEN. Initial call taken by: Barb Merino,  May 17, 2009 8:57 AM  Follow-up for Phone Call        Spoke with pt wife, pt was seen by Dr Corliss Skains over the weekend. C/o pain elbow  which got very red , warm to the touch.  Dx with cellulitis given Keflex 500mg , and recommended to f/u with pcp. Follow-up by: Kandice Hams,  May 17, 2009 9:38 AM

## 2010-03-18 NOTE — Letter (Signed)
Summary: [facility]  [facility]   Imported By: Lanelle Bal 01/04/2010 09:13:47  _____________________________________________________________________  External Attachment:    Type:   Image     Comment:   External Document

## 2010-04-01 ENCOUNTER — Telehealth (INDEPENDENT_AMBULATORY_CARE_PROVIDER_SITE_OTHER): Payer: Self-pay | Admitting: *Deleted

## 2010-04-06 NOTE — Progress Notes (Signed)
Summary: Metformin rx  Phone Note Other Incoming   Summary of Call: [facility] Metofrmin [facility] to them so that the pt can have his rx filled through them. Please fax to [phone]. Initial call taken by: Army Fossa CMA,  April 01, 2010 1:48 PM  Follow-up for Phone Call        Last labs check was 07/2009, patient was to recheck labs in 8-10 weeks (never rechecked)   RX faxed to [phone]. Follow-up by: Shonna Chock CMA,  April 01, 2010 4:11 PM    New/Updated Medications: METFORMIN HCL 500 MG XR24H-TAB (METFORMIN HCL) 1 once daily WITH largest meal **LABS DUE** Prescriptions: METFORMIN HCL 500 MG XR24H-TAB (METFORMIN HCL) 1 once daily WITH largest meal **LABS DUE**  #60 x 0   Entered by:   Shonna Chock CMA   Authorized by:   Marga Melnick MD   Signed by:   Shonna Chock CMA on 04/01/2010   Method used:   Print then Give to Patient   RxID:   [id number]

## 2010-04-26 ENCOUNTER — Encounter: Payer: Self-pay | Admitting: Internal Medicine

## 2010-04-26 ENCOUNTER — Ambulatory Visit (INDEPENDENT_AMBULATORY_CARE_PROVIDER_SITE_OTHER): Payer: Medicare Other | Admitting: Internal Medicine

## 2010-04-26 ENCOUNTER — Other Ambulatory Visit: Payer: Self-pay | Admitting: Internal Medicine

## 2010-04-26 DIAGNOSIS — R609 Edema, unspecified: Secondary | ICD-10-CM

## 2010-04-26 DIAGNOSIS — R7309 Other abnormal glucose: Secondary | ICD-10-CM

## 2010-04-26 DIAGNOSIS — R5381 Other malaise: Secondary | ICD-10-CM

## 2010-04-26 DIAGNOSIS — R5383 Other fatigue: Secondary | ICD-10-CM

## 2010-04-26 DIAGNOSIS — R0609 Other forms of dyspnea: Secondary | ICD-10-CM

## 2010-04-26 DIAGNOSIS — R0989 Other specified symptoms and signs involving the circulatory and respiratory systems: Secondary | ICD-10-CM | POA: Insufficient documentation

## 2010-04-26 LAB — T4, FREE: Free T4: 0.92 ng/dL (ref 0.60–1.60)

## 2010-04-26 LAB — TSH: TSH: 4.43 u[IU]/mL (ref 0.35–5.50)

## 2010-04-27 LAB — URINALYSIS, ROUTINE W REFLEX MICROSCOPIC
Bilirubin Urine: NEGATIVE
Glucose, UA: NEGATIVE mg/dL
Ketones, ur: 15 mg/dL — AB
Specific Gravity, Urine: 1.021 (ref 1.005–1.030)
pH: 6 (ref 5.0–8.0)

## 2010-04-27 LAB — URINE MICROSCOPIC-ADD ON

## 2010-04-27 LAB — URINE CULTURE: Culture  Setup Time: 201110231158

## 2010-05-03 NOTE — Assessment & Plan Note (Signed)
Summary: talk about blood sugar rising///sph   Vital Signs:  Patient profile:   65 year old male Weight:      302 pounds BMI:     44.76 Pulse rate:   80 / minute Resp:     16 per minute BP sitting:   142 / 82  (left arm) Cuff size:   large  Vitals Entered By: Shonna Chock CMA (April 26, 2010 9:25 AM) CC: 1.) Discuss Labs: patient states he had 2 meals the day he had labs (NOT FASTING)  2.) Discuss Metoprolol-? instruction  3.) Fatigue, Type 2 diabetes mellitus follow-up   Primary Care Provider:  Marga Melnick  CC:  1.) Discuss Labs: patient states he had 2 meals the day he had labs (NOT FASTING)  2.) Discuss Metoprolol-? instruction  3.) Fatigue and Type 2 diabetes mellitus follow-up.  History of Present Illness:    Onset of fatigue was 3 months ago ; ? due to Metformin. He  reports persistent fatigue and fatigue even without physical activity, but denies polyuria.  The patient also reports dyspnea.  The patient denies fever, night sweats, weight loss, exertional chest pain, cough, and hemoptysis.  Other symptoms include leg swelling(support hose worn), orthopnea, PND rarely ( no PMH of asthma) , daytime sleepiness, and skin changes ( chronically dry).  The patient denies the following symptoms: melena, adenopathy, and severe snoring( Sleep Apnea study  was neg 2010). He has  poor sleep due to joint pain.  The patient denies feeling depressed and altered appetite.  Labs 03/08:normal CBC & dif; AST 41(<37); glucose 226. No TSH performed. .    Glucose  was non fasting glucose ( @ Dr Fatima Sanger office) He  reports polyuria, but denies polydipsia ( but dry mouth), blurred vision, and numbness of extremities.  Other symptoms include occasional orthostatic symptoms.  The patient denies the following symptoms: neuropathic pain, vomiting, poor wound healing, vision loss, and foot ulcer.  Since the last visit the patient reports good dietary compliance, not exercising regularly due to arthritis, and  not monitoring blood glucose.    Current Medications (verified): 1)  Doxazosin Mesylate 4 Mg Tabs (Doxazosin Mesylate) .Marland Kitchen.. 1 By Mouth Qd 2)  Viagra 100 Mg Tabs (Sildenafil Citrate) .... 1/2 -1 Once Daily As Needed 3)  Cyclobenzaprine Hcl 5 Mg Tabs (Cyclobenzaprine Hcl) .... Take 1 Tablet By Mouth At Bedtime As Needed 4)  Metoprolol Tartrate 25 Mg Tabs (Metoprolol Tartrate) .... 1/2 By Mouth Two Times A Day 5)  Multivitamins  Tabs (Multiple Vitamin) .Marland Kitchen.. 1 By Mouth Once Daily 6)  Calcium 600 1500 Mg Tabs (Calcium Carbonate) .Marland Kitchen.. 1 By Mouth Once Daily 7)  Fish Oil 1000 Mg Caps (Omega-3 Fatty Acids) .Marland Kitchen.. 1 By Mouth Once Daily 8)  Zyrtec Allergy 10 Mg Tabs (Cetirizine Hcl) .Marland Kitchen.. 1 Tablet Once Daily 9)  Allopurinol 300 Mg Tabs (Allopurinol) .Marland Kitchen.. 1 By Mouth Two Times A Day 10)  Omeprazole 20 Mg Cpdr (Omeprazole) .Marland Kitchen.. 1-2 By Mouth Once Daily 11)  Mobic 7.5 Mg Tabs (Meloxicam) .Marland Kitchen.. 1 By Mouth Once Daily 12)  Hydrocodone-? Dose 13)  Metformin Hcl 500 Mg Xr24h-Tab (Metformin Hcl) .Marland Kitchen.. 1 Once Daily With Largest Meal **labs Due** 14)  Spirulina  Powd (Spirulina) .Marland Kitchen.. 1 Teaspoon Daily  Allergies: 1)  ! Codeine 2)  ! Wellbutrin 3)  ! Zoloft 4)  ! Lexapro (Escitalopram Oxalate) 5)  ! * Metoprolol Succinate  Physical Exam  General:  in no acute distress; alert,appropriate and cooperative throughout examination; weight  excess Eyes:  No corneal or conjunctival inflammation noted. EOMI. Perrla.No lid lag Neck:  No deformities, masses, or tenderness noted. Lungs:  Normal respiratory effort, chest expands symmetrically. Lungs are clear to auscultation, no crackles or wheezes. Heart:  Normal rate and regular rhythm. S1 and S2 normal without gallop, murmur, click, rub . Distant heart sounds Abdomen:  Bowel sounds positive,abdomen soft and non-tender without masses, organomegaly or hernias noted. Protuberant ; dullness RUQ Pulses:  R and L carotid,radial,dorsalis pedis and posterior tibial pulses are full  and equal bilaterally Extremities:  No clubbing, cyanosis, edema(wearing support hose)  Neurologic:  alert & oriented X3 and DTRs symmetrical but 0-1/2+. No tremor Skin:  Intact without suspicious lesions or rashes Cervical Nodes:  No lymphadenopathy noted Axillary Nodes:  No palpable lymphadenopathy Psych:  memory intact for recent and remote, normally interactive, and good eye contact.     Impression & Recommendations:  Problem # 1:  FATIGUE (ICD-780.79)  Orders: Venipuncture (62694) TLB-TSH (Thyroid Stimulating Hormone) (84443-TSH) TLB-T4 (Thyrox), Free [phone]) Specimen Handling (00938)  Problem # 2:  HYPERGLYCEMIA (ICD-790.29)  His updated medication list for this problem includes:    Metformin Hcl 500 Mg Xr24h-tab (Metformin hcl) .Marland Kitchen... 1 once daily with largest meal  Orders: Venipuncture (18299) TLB-A1C / Hgb A1C (Glycohemoglobin) (83036-A1C) Specimen Handling (37169)  Problem # 3:  PAROXYSMAL NOCTURNAL DYSPNEA (ICD-786.09)  His updated medication list for this problem includes:    Metoprolol Tartrate 25 Mg Tabs (Metoprolol tartrate) .Marland Kitchen... 1/2 by mouth two times a day  Orders: Venipuncture (67893) TLB-BNP (B-Natriuretic Peptide) (83880-BNPR)  Problem # 4:  EDEMA (ICD-782.3)  Orders: Venipuncture (81017) TLB-BNP (B-Natriuretic Peptide) (83880-BNPR) Specimen Handling (51025)  Complete Medication List: 1)  Doxazosin Mesylate 4 Mg Tabs (Doxazosin mesylate) .Marland Kitchen.. 1 by mouth qd 2)  Viagra 100 Mg Tabs (Sildenafil citrate) .... 1/2 -1 once daily as needed 3)  Cyclobenzaprine Hcl 5 Mg Tabs (Cyclobenzaprine hcl) .... Take 1 tablet by mouth at bedtime as needed 4)  Metoprolol Tartrate 25 Mg Tabs (Metoprolol tartrate) .... 1/2 by mouth two times a day 5)  Multivitamins Tabs (Multiple vitamin) .Marland Kitchen.. 1 by mouth once daily 6)  Calcium 600 1500 Mg Tabs (Calcium carbonate) .Marland Kitchen.. 1 by mouth once daily 7)  Fish Oil 1000 Mg Caps (Omega-3 fatty acids) .Marland Kitchen.. 1 by mouth once  daily 8)  Zyrtec Allergy 10 Mg Tabs (Cetirizine hcl) .Marland Kitchen.. 1 tablet once daily 9)  Allopurinol 300 Mg Tabs (Allopurinol) .Marland Kitchen.. 1 by mouth two times a day 10)  Omeprazole 20 Mg Cpdr (Omeprazole) .Marland Kitchen.. 1-2 by mouth once daily 11)  Mobic 7.5 Mg Tabs (Meloxicam) .Marland Kitchen.. 1 by mouth once daily 12)  Hydrocodone-? Dose  13)  Metformin Hcl 500 Mg Xr24h-tab (Metformin hcl) .Marland Kitchen.. 1 once daily with largest meal **labs due** 14)  Spirulina Powd (Spirulina) .Marland Kitchen.. 1 teaspoon daily  Patient Instructions: 1)  If A1c is in Diabetic range; glucose monitoring  will be necessary. 2)  Limit your Sodium (Salt) to less than 4 grams a day (slightly less than 1 teaspoon) to prevent fluid retention, swelling, or worsening or symptoms. Echocardiogram will be ordered if BNP is elevated as we discussed.   Orders Added: 1)  Est. Patient Level IV [85277] 2)  Venipuncture [36415] 3)  TLB-TSH (Thyroid Stimulating Hormone) [84443-TSH] 4)  TLB-A1C / Hgb A1C (Glycohemoglobin) [83036-A1C] 5)  TLB-BNP (B-Natriuretic Peptide) [83880-BNPR] 6)  TLB-T4 (Thyrox), Free [82423-NT6R] 7)  Specimen Handling [99000]

## 2010-05-04 ENCOUNTER — Encounter: Payer: Self-pay | Admitting: Internal Medicine

## 2010-05-04 ENCOUNTER — Ambulatory Visit (INDEPENDENT_AMBULATORY_CARE_PROVIDER_SITE_OTHER): Payer: Medicare Other | Admitting: *Deleted

## 2010-05-04 MED ORDER — GLUCOSE BLOOD VI STRP: ORAL_STRIP | Status: DC

## 2010-05-04 MED ORDER — ONETOUCH DELICA LANCETS MISC: Status: DC

## 2010-05-05 ENCOUNTER — Encounter: Payer: Self-pay | Admitting: Internal Medicine

## 2010-05-25 LAB — CBC
HCT: 39.1 % (ref 39.0–52.0)
Hemoglobin: 13 g/dL (ref 13.0–17.0)
MCHC: 33.2 g/dL (ref 30.0–36.0)
MCV: 94.4 fL (ref 78.0–100.0)
RDW: 14.6 % (ref 11.5–15.5)

## 2010-05-25 LAB — DIFFERENTIAL
Eosinophils Relative: 2 % (ref 0–5)
Lymphocytes Relative: 34 % (ref 12–46)
Lymphs Abs: 3.6 10*3/uL (ref 0.7–4.0)
Monocytes Absolute: 1.2 10*3/uL — ABNORMAL HIGH (ref 0.1–1.0)
Monocytes Relative: 12 % (ref 3–12)

## 2010-05-25 LAB — POCT I-STAT, CHEM 8
Calcium, Ion: 1.1 mmol/L — ABNORMAL LOW (ref 1.12–1.32)
Glucose, Bld: 132 mg/dL — ABNORMAL HIGH (ref 70–99)
HCT: 40 % (ref 39.0–52.0)
Hemoglobin: 13.6 g/dL (ref 13.0–17.0)
Potassium: 4.2 mEq/L (ref 3.5–5.1)

## 2010-05-25 LAB — PROTIME-INR: Prothrombin Time: 15.1 seconds (ref 11.6–15.2)

## 2010-05-25 LAB — CARDIAC PANEL(CRET KIN+CKTOT+MB+TROPI)
CK, MB: 0.5 ng/mL (ref 0.3–4.0)
Relative Index: INVALID (ref 0.0–2.5)
Total CK: 63 U/L (ref 7–232)
Total CK: 67 U/L (ref 7–232)

## 2010-05-25 LAB — URINALYSIS, ROUTINE W REFLEX MICROSCOPIC
Bilirubin Urine: NEGATIVE
Nitrite: NEGATIVE
Specific Gravity, Urine: 1.017 (ref 1.005–1.030)
Urobilinogen, UA: 1 mg/dL (ref 0.0–1.0)

## 2010-05-25 LAB — BASIC METABOLIC PANEL
BUN: 10 mg/dL (ref 6–23)
Creatinine, Ser: 1.06 mg/dL (ref 0.4–1.5)
GFR calc non Af Amer: >60 mL/min (ref >60)
Glucose, Bld: 131 mg/dL — ABNORMAL HIGH (ref 70–99)

## 2010-05-25 LAB — POCT CARDIAC MARKERS: CKMB, poc: <1 ng/mL — ABNORMAL LOW (ref 1.0–8.0)

## 2010-05-25 LAB — CK TOTAL AND CKMB (NOT AT ARMC): CK, MB: 0.4 ng/mL (ref 0.3–4.0)

## 2010-05-25 LAB — TROPONIN I: Troponin I: <0.01 ng/mL (ref 0.00–0.06)

## 2010-05-25 LAB — D-DIMER, QUANTITATIVE: D-Dimer, Quant: 1.34 ug/mL-FEU — ABNORMAL HIGH (ref 0.00–0.48)

## 2010-05-26 ENCOUNTER — Telehealth: Payer: Self-pay | Admitting: Internal Medicine

## 2010-05-26 LAB — DIFFERENTIAL
Basophils Absolute: 0 10*3/uL (ref 0.0–0.1)
Basophils Absolute: 0 10*3/uL (ref 0.0–0.1)
Basophils Relative: 1 % (ref 0–1)
Eosinophils Absolute: 0.1 10*3/uL (ref 0.0–0.7)
Eosinophils Absolute: 0.2 10*3/uL (ref 0.0–0.7)
Eosinophils Relative: 2 % (ref 0–5)
Lymphocytes Relative: 31 % (ref 12–46)
Lymphs Abs: 1.8 10*3/uL (ref 0.7–4.0)
Monocytes Absolute: 0.9 10*3/uL (ref 0.1–1.0)
Neutrophils Relative %: 47 % (ref 43–77)

## 2010-05-26 LAB — BASIC METABOLIC PANEL
BUN: 11 mg/dL (ref 6–23)
BUN: 11 mg/dL (ref 6–23)
CO2: 22 mEq/L (ref 19–32)
CO2: 24 mEq/L (ref 19–32)
Calcium: 8 mg/dL — ABNORMAL LOW (ref 8.4–10.5)
Calcium: 8 mg/dL — ABNORMAL LOW (ref 8.4–10.5)
Chloride: 106 mEq/L (ref 96–112)
Chloride: 107 mEq/L (ref 96–112)
Creatinine, Ser: 1.02 mg/dL (ref 0.4–1.5)
Creatinine, Ser: 1.05 mg/dL (ref 0.4–1.5)
Creatinine, Ser: 1.42 mg/dL (ref 0.4–1.5)
Creatinine, Ser: 1.73 mg/dL — ABNORMAL HIGH (ref 0.4–1.5)
Creatinine, Ser: 2.73 mg/dL — ABNORMAL HIGH (ref 0.4–1.5)
GFR calc Af Amer: 28 mL/min — ABNORMAL LOW (ref >60)
GFR calc Af Amer: >60 mL/min (ref >60)
GFR calc Af Amer: >60 mL/min (ref >60)
GFR calc non Af Amer: 24 mL/min — ABNORMAL LOW (ref >60)
GFR calc non Af Amer: >60 mL/min (ref >60)
GFR calc non Af Amer: >60 mL/min (ref >60)
Glucose, Bld: 120 mg/dL — ABNORMAL HIGH (ref 70–99)
Glucose, Bld: 127 mg/dL — ABNORMAL HIGH (ref 70–99)
Potassium: 3.4 mEq/L — ABNORMAL LOW (ref 3.5–5.1)
Potassium: 4.4 mEq/L (ref 3.5–5.1)
Sodium: 129 mEq/L — ABNORMAL LOW (ref 135–145)
Sodium: 135 mEq/L (ref 135–145)

## 2010-05-26 LAB — CBC
HCT: 44.2 % (ref 39.0–52.0)
Hemoglobin: 10.8 g/dL — ABNORMAL LOW (ref 13.0–17.0)
Hemoglobin: 12.1 g/dL — ABNORMAL LOW (ref 13.0–17.0)
MCHC: 35.1 g/dL (ref 30.0–36.0)
MCV: 94.2 fL (ref 78.0–100.0)
Platelets: 155 10*3/uL (ref 150–400)
Platelets: 196 10*3/uL (ref 150–400)
RBC: 3 MIL/uL — ABNORMAL LOW (ref 4.22–5.81)
RBC: 3 MIL/uL — ABNORMAL LOW (ref 4.22–5.81)
RBC: 4.69 MIL/uL (ref 4.22–5.81)
RDW: 14.7 % (ref 11.5–15.5)
RDW: 15 % (ref 11.5–15.5)
RDW: 15.1 % (ref 11.5–15.5)
RDW: 15.2 % (ref 11.5–15.5)
WBC: 5.7 10*3/uL (ref 4.0–10.5)
WBC: 6.2 10*3/uL (ref 4.0–10.5)
WBC: 6.6 10*3/uL (ref 4.0–10.5)
WBC: 5.2 10*3/uL (ref 4.0–10.5)

## 2010-05-26 LAB — CARDIAC PANEL(CRET KIN+CKTOT+MB+TROPI)
CK, MB: 3.4 ng/mL (ref 0.3–4.0)
CK, MB: 6.7 ng/mL — ABNORMAL HIGH (ref 0.3–4.0)
Relative Index: 0.4 (ref 0.0–2.5)
Total CK: 1088 U/L — ABNORMAL HIGH (ref 7–232)
Total CK: 851 U/L — ABNORMAL HIGH (ref 7–232)
Troponin I: <0.01 ng/mL (ref 0.00–0.06)
Troponin I: <0.01 ng/mL (ref 0.00–0.06)

## 2010-05-26 LAB — URINALYSIS, ROUTINE W REFLEX MICROSCOPIC
Bilirubin Urine: NEGATIVE
Glucose, UA: NEGATIVE mg/dL
Hgb urine dipstick: NEGATIVE
Ketones, ur: NEGATIVE mg/dL
Nitrite: NEGATIVE
Specific Gravity, Urine: 1.022 (ref 1.005–1.030)
pH: 5.5 (ref 5.0–8.0)

## 2010-05-26 LAB — COMPREHENSIVE METABOLIC PANEL
AST: 63 U/L — ABNORMAL HIGH (ref 0–37)
Alkaline Phosphatase: 102 U/L (ref 39–117)
CO2: 25 mEq/L (ref 19–32)
Chloride: 104 mEq/L (ref 96–112)
Creatinine, Ser: 1.13 mg/dL (ref 0.4–1.5)
GFR calc Af Amer: >60 mL/min (ref >60)
GFR calc non Af Amer: >60 mL/min (ref >60)
Potassium: 4.4 mEq/L (ref 3.5–5.1)
Total Bilirubin: 0.8 mg/dL (ref 0.3–1.2)

## 2010-05-26 LAB — URINE MICROSCOPIC-ADD ON

## 2010-05-26 LAB — HEPATIC FUNCTION PANEL
ALT: 77 U/L — ABNORMAL HIGH (ref 0–53)
AST: 66 U/L — ABNORMAL HIGH (ref 0–37)
Albumin: 3.6 g/dL (ref 3.5–5.2)
Alkaline Phosphatase: 116 U/L (ref 39–117)
Total Bilirubin: 0.8 mg/dL (ref 0.3–1.2)

## 2010-05-26 LAB — URINE CULTURE
Colony Count: NO GROWTH
Culture: NO GROWTH

## 2010-05-26 LAB — APTT: aPTT: 31 seconds (ref 24–37)

## 2010-05-26 LAB — BRAIN NATRIURETIC PEPTIDE: Pro B Natriuretic peptide (BNP): 89 pg/mL (ref 0.0–100.0)

## 2010-05-26 MED ORDER — ALLOPURINOL 300 MG PO TABS: 300.0000 mg | ORAL_TABLET | Freq: Every day | ORAL | Status: DC

## 2010-05-26 MED ORDER — METFORMIN HCL 500 MG PO TABS: 500.0000 mg | ORAL_TABLET | Freq: Every day | ORAL | Status: DC

## 2010-05-26 MED ORDER — METOPROLOL TARTRATE 25 MG PO TABS: 25.0000 mg | ORAL_TABLET | ORAL | Status: DC

## 2010-05-26 MED ORDER — DOXAZOSIN MESYLATE 4 MG PO TABS: 4.0000 mg | ORAL_TABLET | Freq: Every day | ORAL | Status: DC

## 2010-05-26 MED ORDER — CETIRIZINE HCL 10 MG PO TABS: 10.0000 mg | ORAL_TABLET | Freq: Every day | ORAL | Status: DC

## 2010-05-26 MED ORDER — MELOXICAM 7.5 MG PO TABS: 7.5000 mg | ORAL_TABLET | Freq: Every day | ORAL | Status: DC

## 2010-05-26 MED ORDER — CYCLOBENZAPRINE HCL 5 MG PO TABS: 5.0000 mg | ORAL_TABLET | Freq: Every day | ORAL | Status: DC | PRN

## 2010-05-26 NOTE — Telephone Encounter (Signed)
Patient was getting his prescriptions through the [place], but discovered he could get them through [facility] for a cheaper cost----needs the following 90 day prescriptions: 1) Metoprolol    2)  Doxazosin     3) Metformin   4)  Cyclobenzaprine    5)  Cetirizine    6)  Allopurinol    7)  Meloxicam   8)  Blood pressure pill??---he was not sure of we mentioned this one yet  Patient was driving in his car while we went over his list from EMR

## 2010-06-28 NOTE — Discharge Summary (Signed)
NAME:  Terry Lyons, Terry Lyons NO.:  [id number]   MEDICAL RECORD NO.:  [id number]          PATIENT TYPE:  INP   LOCATION:  6704                         FACILITY:  [facility]   PHYSICIAN:  Dyke Brackett, M.D.    DATE OF BIRTH:  [birth year]   DATE OF ADMISSION:  05/01/2008  DATE OF DISCHARGE:                               DISCHARGE SUMMARY   ADMITTING DIAGNOSIS:  Right knee osteoarthritis.   DISCHARGE FINAL DIAGNOSIS:  Status post right total knee replacement for  right knee osteoarthritis, physician Dr. Madelon Lips, physician assistant  Kateri Plummer, P.A.   PROCEDURAL NOTE:  The patient presented to the hospital on May 01, 2008, with right knee osteoarthritis.  Taken to the OR for a right total  knee replacement with estimated blood loss about 200 mL.  Transferred to  the PACU in stable condition.  Satisfied postoperative x-ray right total  knee replacement.   HOSPITAL COURSE:  Terry Lyons is a 65 year old male who came to the  hospital on May 01, 2008, right knee osteoarthritis, procedure right  total knee replacement performed, came to the PACU in stable condition  and then transferred up to the 5000 floor in stable condition on May 01, 2008.  The patient's first postoperative day vital signs are stable,  he is afebrile, slightly tachycardic around 110 and his hemoglobin was  11.6, his creatinine was 1.73, he was seen by Dr. Madelon Lips and planned to  observe and continue him up with physical therapy.  Postoperative day #2  was on May 03, 2008.  The patient once again seen by Dr. Madelon Lips.  He  was concerned about potential early ileus, apparently ordered an x-ray  and then his creatinine jumped up to 2.73, so I ordered a medicine  consult.  Hemoglobin was at 10.8 and internal medicine helped addressed  the elevated creatinine, I believe gave a fluid bolus to help out with  that.  Postoperative day #3 was May 04, 2008.  The patient had a bowel  movement so ileus improved,  hemoglobin was at 10.0, the vital signs  stable, afebrile but he was tachycardic in the 120s.  Evaluated by  internal medicine, determined concerns about the tachycardia and  apparently ordered a D-dimer which I believe was positive, as he was  postoperative, and then decided to do a VQ scan which came out negative  for PE.  Still concerned about tachycardic so they consulted cardiology  to determine the patient's best interest to get a 2-D echocardiogram,  which is currently pending at this point, but they did state in their  note that if 2-D echocardiogram is negative patient could go to skilled  nursing facility or home today, which is May 05, 2008.  Cardiology did  also comment that they do not think the patient is dehydrated at this  point.  His creatinines returned to normal and they did mention that  they think it would be best interest for him to go on Lopressor to help  with tachycardia as long as his blood pressure tolerates it.  May 05, 2008, postoperative day #4 for right total knee replacement.  Creatinine  returned to normal, hemoglobin at 9.9.  Vital signs are stable, he is  afebrile.  He has tachycardia 118 to 120s.  Pending 2-D echocardiogram.  If that is negative, will discharge to skilled nursing facility today.  Patient having no calf pain on exam.  Dorsiflexion, plantar flexion  within normal limits.  Neurovascularly intact.  Incision is benign with  no signs of infection, erythema, cellulitis.   ASSESSMENT/PLAN:  Terry Lyons is a 65 year old male status post right total  knee replacement.  Plan for discharge to skilled nursing facility today  on May 05, 2008.  Negative 2-D echocardiogram and approved by  cardiology.  At skilled nursing facility, he would be 50% weightbearing  in the right lower extremity.  Diet would be a regular diet.  To be  discharged in stable condition.  Plan for 14 days postoperatively for  staple removal, follow up in our office in 10 to  14 days for recheck.  Call for appointment at [phone], and ask for appointment with Dr.  Madelon Lips.  Continue at skilled nursing facility CPM 6 to 8 hours per day  0 to 90 degrees, increase 2 to 3 degrees per day until it is to 90  degrees.  Physical therapy will be 50% weightbearing with daily therapy  recommended.   DISCHARGE MEDICATIONS:  1. Omeprazole 20 mg daily.  2. Lovenox 40 mg daily for a total of 14 days postoperatively.  3. Percocet 5/325 one to two tablets p.o. q.4-6 h p.r.n. pain.  4. Flexeril 10 mg 2 times daily.  5. Benicar 40 mg every morning.  6. Doxazosin 4 mg every morning.  7. Allopurinol 300 mg every morning.  8. Colchicine 0.6 mg 2 times daily.  9. Loratadine 10 mg every morning.  10.Multivitamin every morning.  11.Calcium plus Vitamin D twice daily.  12.Fish Oil 1000 mg every morning.  13.Saw Palmetto taken 1 tablet twice daily.  14.Aspirin 81 mg to be taken every morning.  15.Daily fiber tablet supplement, he is taking 2 tablets per day.  16.It appears the cardiologist is asking to try at discharge Lopressor      2.5 mg p.o. q.6 hours if his blood pressure tolerates it, per      cardiology note.      Sharol Given, PA      Dyke Brackett, M.D.  Electronically Signed    JBS/MEDQ  D:  05/05/2008  T:  05/05/2008  Job:  [id number]

## 2010-06-28 NOTE — Consult Note (Signed)
NAME:  Terry Lyons, Terry Lyons NO.:  [id number]   MEDICAL RECORD NO.:  [id number]          PATIENT TYPE:  INP   LOCATION:  6704                         FACILITY:  [facility]   PHYSICIAN:  Pricilla Riffle, MD, FACCDATE OF BIRTH:  1945/09/03   DATE OF CONSULTATION:  DATE OF DISCHARGE:                                 CONSULTATION   PRIMARY CARDIOLOGIST:  Arturo Morton. Riley Kill, MD, [facility]   PRIMARY CARE PHYSICIAN:  Dr. Alwyn Ren.   REQUESTING PHYSICIAN:  Sean A. Everardo All, MD   REASON FOR CONSULTATION:  Positive cardiac enzymes, abnormal EKG, and  tachycardia.   HISTORY OF PRESENT ILLNESS:  This 65 year old Caucasian male with  history of nonobstructive CAD per cardiac catheterization in 2001 with  50-70% mild LAD, systolic compression with myocardial bridging with also  history of osteoarthritis, who was admitted for right total knee  replacement on May 01, 2008.  Postoperatively, the patient was found  to be hyponatremic, had elevated creatinine of 2.3 and a transient  ileus.  The patient currently is tachycardia with an elevated CK and a  CK-MB but negative troponins.  We are asked for cardiac evaluation and  recommendations.   The wife has noticed that the patient's breathing status has been  declining along with energy level over the last several months.  She  also states he has been sleeping a lot, but the patient himself denies  any chest pain.  He has had some exertional dyspnea, but he states he  felt like this was because he was out of shape and overweight.  Prior to  admission, the patient's primary care physician did preoperative labs  and the patient was found to have elevated SGOT of 66 and an SGPT of 77.  The patient had subsequent ultrasound of his abdomen which showed a  fatty liver.   The patient is currently sitting up in a chair and is without complaint  of chest pain.  His wife is at bedside and he remains tachycardiac.   REVIEW OF SYSTEMS:  Positive for dyspnea  on exertion, lack of energy,  inactivity, arthralgia, pain in the right knee postoperatively.  All  other systems reviewed and found to be negative.   PAST MEDICAL HISTORY:  1. GERD.  2. Gout.  3. Osteoarthritis.  4. Nonobstructive CAD status post cardiac catheterization in May 2001.      a.     A 50-70% improved mid LAD, improved with nitroglycerin,       systolic compression with myocardial bridging.  5. Hyperlipidemia.  6. Fatty liver.  7. DVT to the left leg status post arthroscopy in 1975 with transient      Coumadin therapy.  8. Esophageal dilatation.  9. Obesity.   PAST SURGICAL HISTORY:  Right knee arthroscopy, left knee arthroscopy,  and subsequent total right knee on May 01, 2008.   Socially, he lives in [place] with his wife.  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  He is married with 2 children.  He was a 50-pack-year smoker  but stopped in 1975.  Negative for EtOH.  The patient has been very  inactive.   FAMILY HISTORY:  Mother deceased from lung cancer.  Father deceased from  rheumatic heart disease.  He has one brother with an MI at age 74 with  subsequent CABG.   CURRENT MEDICATIONS:  1. Low-molecular-weight heparin 30 mg subcu q.12 hours.  2. Colace 100 mg b.i.d.  3. Benicar 40 mg daily.  4. Cardura 4 mg daily.  5. Allopurinol 300 mg daily.  6. Claritin 10 mg daily.  7. Multivitamin daily.  8. Omega-3 daily.  9. Aspirin 81 mg daily.  10.FiberCon daily.  11.Colchicine 0.6 mg daily.   Allergies to CODEINE, WELLBUTRIN, ZOLOFT, and LEXAPRO.   CURRENT LABORATORY DATA:  Sodium 135, potassium 4.4, chloride 106, CO2  22, BUN 22, creatinine 1.42, glucose 133.  Hemoglobin 10.0, hematocrit  28.6, white blood cells 6.6, platelets 155.  D-dimer 0.98.  BNP 89,  troponin 0.01 x3.  CK 1088, 1031, and 851, respectively.  MB 6.7, 4.7,  and 3.4, respectively.  EKG revealing sinus tachycardia, ventricular  rate of 120 beats per minute with inferior T-wave depression in lead  III  with flattening in II and aVF.  Chest x-ray dated May 03, 2008,  reveals low inspiratory phase film bibasilar segmental atelectasis.   PHYSICAL EXAMINATION:  VITAL SIGNS:  Blood pressure 103/68, pulse 118,  respirations 18, temperature 99.2, O2 sat 91% on room air.  GENERAL:  He is sleepy, but responds appropriately.  HEENT:  Head is normocephalic and atraumatic.  Eyes, PERRLA.  Mucous  membranes in mouth are pink and moist.  NECK:  Supple.  There is no carotid bruit mild JVD is noted.  The neck  is obese.  CARDIOVASCULAR:  Tachycardiac.  Regular rate and rhythm without murmurs,  rubs, or gallops.  Pulses are equal bilaterally.  LUNGS:  Clear to auscultation.  However, it does hurt to take a deep  breath in his diaphragmatic area.  ABDOMEN:  Obese, nontender with 2+ bowel sounds.  There is no  distention, no rebound or guarding.  EXTREMITIES:  The right knee is in a dressing with 1+ edema noted and  nonpitting edema noted on the left.  NEUROLOGIC:  He is sleepy, but cranial nerves II through XII are grossly  intact.   IMPRESSION:  1. Abnormal EKG with inferior changes with elevated CK-MB and CK.  2. Tachycardia.  3. History of nonobstructive coronary artery disease per cath in 2001      secondary to systolic compression with myocardial bridging.  4. Status post right total knee.  5. Hypotension.   PLAN:  This is a 65 year old obese Caucasian male admitted for total  right knee replacement with postoperative period complicated by ileus,  acute renal insufficiency, elevated CK-MB and CK with inferior changes  on an EKG along with hypotension.  The patient has been seen and  examined by myself and Dr. Dietrich Pates.  The patient remains tachycardiac  and mildly hypotensive.  We will hold Benicar for now, continue IV  fluids, agree with rule out PE studies.  Echocardiogram can be  considered at a later date once the patient's heart rate stabilizes for  LV function, elevated  troponins, and elevated CK and MB related to  surgery.   We will follow watching heart rate and patient's response to holding of  Benicar.  If the patient is not symptomatic, we will continue the  patient's other medications without changes and make further  recommendations throughout hospital course based on the patient's  response.      Bettey Mare. Lyman Bishop, NP      Pricilla Riffle, MD, [facility]  Electronically Signed    KML/MEDQ  D:  05/04/2008  T:  05/05/2008  Job:  [id number]   cc:   Dr. Alwyn Ren.

## 2010-06-28 NOTE — Op Note (Signed)
NAME:  AVRY, ROEDL NO.:  [id number]   MEDICAL RECORD NO.:  [id number]          PATIENT TYPE:  INP   LOCATION:  5001                         FACILITY:  [facility]   PHYSICIAN:  Dyke Brackett, M.D.    DATE OF BIRTH:  07/15/1945   DATE OF PROCEDURE:  05/01/2008  DATE OF DISCHARGE:                               OPERATIVE REPORT   PREOPERATIVE DIAGNOSIS:  Severe osteoarthritis with superimposed gout,  right knee.   POSTOPERATIVE DIAGNOSIS:  Severe osteoarthritis with superimposed gout,  right knee.   OPERATION:  Right total knee replacement (cemented LCS, large femur,  patella 10-mm bearing with size 5 tibia).   SURGEON:  Dyke Brackett, MD   ASSISTANT:  Sharol Given, PA   TOURNIQUET TIME:  1 hour 30 minutes.   DESCRIPTION OF PROCEDURE:  Sterile prep and drape, exsanguination of  leg, inflation of tourniquet 400.  Knee was approached with straight  skin incision.  Medial parapatellar approach to the knee made.  Large  amount of osteophytes removed from the femur.  Tibia was cut about 2.5  mm below the most diseased medial compartment.  After the tibia cut, the  anterior and posterior femoral cut was made, 10-mm flexion gap measured,  followed by 4-degree distal valgus cut.  Finishing guide was placed  after removal of excess portions of the menisci and release of the PCL.  The finishing guide was placed.  Posterior osteophytes were removed from  the femur, particularly on the medial side.  The tibial guide was used  to cut the keel hole for the prosthesis with a size 5 tibia, followed by  trial tibia and femur with full extension noted.  Femur was cut distally  on 2 occasions to get rid of the flexion contracture noted  preoperatively.  The patella was cut leaving about 14 mm of native  patella for the 3 peg patella trial.  Trial showed full extension.  No  tendency to varus valgus and good flexion with good equal balance  tensioning on both sides of the  collateral ligaments.  Trial components  were removed.  Bony surfaces were irrigated.  The final prosthesis was  inserted with the trial bearing tibia followed by femur with 2 batches  of cement impregnated with 1.2 g tobramycin and 1 g of vancomycin.  Cement was allowed to harden.  Trial bearing was removed.  Excess cement  then was removed from the back of the knee.  Tourniquet was released.  No excessive bleeding was noted.  Final bearing was inserted, 10-mm  bearing.  The closure was effected on the capsule after placement of  deep Hemovac drain with interrupted Ethibond, 0 and 2-0 Vicryl, and skin  clips.  Taken to recovery room in stable condition.  Compressive  dressing applied.       Dyke Brackett, M.D.  Electronically Signed     WDC/MEDQ  D:  05/01/2008  T:  05/02/2008  Job:  [id number]

## 2010-06-28 NOTE — Discharge Summary (Signed)
NAME:  ALAMIN, MCCUISTON NO.:  [id number]   MEDICAL RECORD NO.:  [id number]          PATIENT TYPE:  OBV   LOCATION:  2034                         FACILITY:  [facility]   PHYSICIAN:  Rosalyn Gess. Norins, MD  DATE OF BIRTH:  09-Feb-1946   DATE OF ADMISSION:  06/09/2008  DATE OF DISCHARGE:  06/10/2008                               DISCHARGE SUMMARY   ADMITTING DIAGNOSES:  1. Chest pain, rule out myocardial infarction.  2. Elevated D-dimer.   DISCHARGE DIAGNOSES:  1. Myocardial infarction ruled out with negative cardiac enzymes.  2. Pulmonary embolism ruled out by CT angiogram.   HISTORY OF PRESENT ILLNESS:  Mr. Skelton is a 65 year old white, married,  retired male, grandfather of 5, who was awakened at 0300 hours on the  day of admission with substernal chest pain with radiation to the right,  but not down his arm and no radiation to the jaw.  He did feel short of  breath.  He reports becoming diaphoretic while being transported by EMS.  In the ED, he was given nitroglycerin, which did relieve his chest pain  after 15-20 minutes.  At the time of exam, he was pain free.  Please see  the EMR H and P for full details of his admission note plus past medical  history, family history, social history, and examination.   HOSPITAL COURSE:  1. The patient was admitted to telemetry unit.  He remained in sinus      rhythm.  Cardiac enzymes were negative x4 with CKs of 60, 63, 67,      73.  Troponin I of less than 0.01 and 0.01.  EKG repeated on the      morning of discharge was normal with no abnormalities or signs of      ischemic change.  The patient was painfree at the time of discharge      dictation.  He was informed that his cardiac enzymes were negative.      The patient does have an appointment on Jul 01, 2008, to see      Cardiology because of tachycardia.  The patient will follow up with      Dr. Alwyn Ren with an appointment scheduled for the June 11, 2008, at      9 a.m. to  decide whether he needs a dobutamine stress test prior to      being seen by Cardiology.  2. Elevated D-dimer.  The patient did have CT angio, which was      negative for PE.  D-dimer is most likely elevated secondary to knee      surgery.   PHYSICAL EXAMINATION AT DISCHARGE:  VITAL SIGNS:  Temperature was 98,  blood pressure 112/61, heart rate 67, respirations 20.  GENERAL:  This is a pleasant, overweight gentleman sitting in chair,  eating breakfast in no acute distress.  HEENT:  Notable for having urticaria behind his ear, on his lips.  He  reports in his mouth but this was not examined.  CHEST:  Clear with no rales, wheezes, or rhonchi.  CARDIOVASCULAR:  2+ radial pulses.  Precordium was quiet.  He had a  regular rate and rhythm.  No further examination conducted.   FINAL LABORATORY DATA:  Cardiac enzymes as noted.  The patient's other  laboratories include CBC at admission was normal.  Urinalysis at  admission was normal.  Basic metabolic panel at admission significant  for a blood sugar of 131, otherwise unremarkable.   DISPOSITION:  The patient is discharged to home.  He is to keep his  appointment with Cardiology as scheduled, and he will continue his home  medications that include allopurinol, aspirin, Benicar, colchicine,  cyclobenzaprine, doxazosin, metoprolol tartrate 50 mg b.i.d., Percocet  as needed for pain.  He is advised to take over-the-counter loratadine  10 mg b.i.d. and generic Pepcid 20 mg b.i.d. for control of his  urticaria.   The patient's condition at the time of discharge dictation is stable and  improved.      Rosalyn Gess Norins, MD  Electronically Signed     MEN/MEDQ  D:  06/10/2008  T:  06/10/2008  Job:  [id number]   cc:   Titus Dubin. Alwyn Ren, MD,FACP,FCCP

## 2010-07-01 NOTE — H&P (Signed)
[facility]  Patient:    ROC, Terry Lyons                           MRN: [id number] Adm. Date:  [id number] Attending:  Dolores Patty                         History and Physical  HISTORY OF PRESENT ILLNESS:  Mr. Konner Saiz is a 65 year old white male admitted with severe chest pain, mimicking angina.  He awoke early this morning with severe substernal chest pain, 5-6 on a 10 scale with radiation to the left upper extremity and left back.  He denied nausea, vomiting or diaphoresis initially.  Subsequently here in the emergency room he has had some mild nausea.  He had a similar episode four days ago associated with dizziness.  He took one 325 aspirin this morning prior to coming to the emergency room.  Significantly, while in bed at approximately 9 p.m. last night he ate ice cream and cookies.  PAST MEDICAL HISTORY:  His past medical history includes knee surgery pilonidal cyst and tonsillectomy.  MEDICATIONS:  He is on Cardura 2 mg daily, multivitamin supplement, Cozaar 50 mg daily and 81 mg every other day.  ALLERGIES:  CODEINE causes nausea or exacerbates his balance dysfunction.  HABITS:  He quit smoking in 1975 but smoked four packs per day.  He states that he was "saved" at that time by prayerful intervention.  He stopped smoking acutely without weaning or withdrawal symptoms.  He does not drink.  FAMILY HISTORY:  Strongly positive for heart disease.  Paternal grandfather had a myocardial infarction in his 68s.  Brother had a heart attack at 53 and died following bypass surgery.  His father died at 60 with rheumatic heart disease.  Mother had cancer of the lung.  Aunt had lung cancer.  There is no diabetes in the family.  REVIEW OF SYSTEMS:  Negative with the exception of the items above and for some foot pain.  He has intermittent pain along the lateral aspect of the right foot for which he takes Silexin and Naprosyn as needed.  It  has precluded his regular exercise program.  Formally he was exercising 25 minutes several times a week on a cycle and using Cybex.  He has no gastroenterologic symptoms of nausea, vomiting, dysphagia, melena, or rectal bleeding.  He had been set up for a flexible sigmoidoscopy on several occasions but has cancelled those each time due to other commitments.  He has had a 30-pound weight loss which has been an intentional diet.  He denies any pulmonary symptoms such as cough, sputum, shortness of breath, or pleuritic pain.  He has had no genitourinary symptoms.  He is in no acute distress at this time but appears deconditioned.  Pulse was variable from 70-95, blood pressure 165/114, respiratory rate 20.  Arterial level of narrowing is fine with fundal examination.  Otolaryngologic examination is unremarkable.  Thyroid is small.  He has no lymphadenopathy about the head, neck or axilla.  No carotid bruits were noted.  He has an S4.  Abdomen is protuberant without significant tenderness.  He has some minor tenderness at the right inferior thoracic aspect.  He has no organomegaly on palpation of the abdomen.  No femoral bruits were appreciated.  Chest is clear to auscultation.  He does have decreased pedal pulses.  Skin appears normal with no ischemic  change.  Homans sign is negative. There are no visible changes at the area of the tenderness of the right foot. He has crepitance of the knees and there is fusiform change.  His ECG reveals an inverted T wave in III and small T wave in aVF but is considered normal.  The troponin is less than 0.3.  Comprehensive metabolic panel was normal.  CPK was 217 with normals up to 195 but the MB fraction was normal.  CBC and differential is normal as well.  He is admitted with chest pain for additional cardiac enzymes and probable Persantine nuclear test.  He may be limited by the foot pain in performing a routine stress test.  He will  be placed on proton pump agents as the most likely explanation here is hiatal hernia related to his nocturnal eating habits and his body habitus. Amylase, lipase, H. pylori will also be collected, although he is essentially asymptomatic.  If symptoms persists despite a negative chest x-ray, a spiral CT will be performed, although pulmonary femoral embolism risk appears to be low at this time. DD:  06/23/99 TD:  06/24/99 Job: [id number]

## 2010-07-01 NOTE — Discharge Summary (Signed)
[facility]  Patient:    Terry Lyons, Terry Lyons                           MRN: [id number] Adm. Date:  [id number] Disc. Date: [id number] Attending:  Dolores Patty Dictator:   Tereso Newcomer, P.A. CC:         Arturo Morton. Riley Kill, M.D. [facility]             Titus Dubin. Alwyn Ren, M.D. [facility]                           Discharge Summary  DATE OF BIRTH:  [birth year]  REASON FOR ADMISSION:  Chest pain consistent with unstable angina.  PROCEDURES PERFORMED:  Coronary angiography revealing left main free of significant disease. LAD (left anterior descending) with evidence of myocardial bridging beyond the origin of the large bifurcating diagonal branch. In this area, there is a narrowing of 50-70% in the mid vessel. Small ramus intermedius free of disease. Circumflex free of disease. Right coronary artery free of disease. Left ventriculography revealing preserved global systolic function.  DISCHARGE DIAGNOSES: 1. Left anterior descending myocardial bridging. 2. Hypertension. 3. Obesity. 4. History of knee surgery. 5. History of pilonidal cyst. 6. History of tonsillectomy, adenoidectomy.  ADMISSION HISTORY:  This 64 year old white male was originally admitted by Dr. Caryl Never service for severe chest pain mimicking angina on Jun 23, 1999. Please refer to Dr. Caryl Never H&P for complete details. Apparently the patient awoke early that morning with severe substernal chest pain that radiated to his left upper extremity and left back. He denied nausea, vomiting and diaphoresis. However, in the emergency room he did have some mild nausea.  HOSPITAL COURSE:  The patient was admitted to ruled out for myocardial infarction by enzymes. Total CK 1) 217, 2) 196, 3) 186. CK-MB 1) 0.5, 2) less than 0.3, 3) less than 0.3. Troponin I less than 0.03 x 2.  On Jun 23, 1999, Dr. Graciela Husbands saw the patient for service and then consultation. It was felt the patient should have cardiac catheterization to  evaluate his chest pain. The patient was continued on Lovenox, nitroglycerin. The patient went for a cardiac catheterization on Jun 24, 1999. The results are noted above. The patient did not have any immediate complications. On the morning of Jun 25, 1999, his groin was felt to be stable. The cardiac catheterization was reviewed with Dr. Juanda Chance. It was not clear whether or not the LAD was the source of the patients chest pain. It was decided the patient would need follow-up functional testing with the addition of nitrates pending the results of the cardiolite testing. The patient was to be continued on protonix as well as aspirin. From a cardiac standpoint, it was felt the patient was stable enough for discharge to home on Jun 25, 1999.  DISCHARGE MEDICATIONS: 1. Nitroglycerin p.r.n. chest pain. 2. Protonix 40 mg q.d. 3. Aspirin 325 mg p.o. q.d.  FOLLOW-UP:  The patient is setup with stress cardiolite 1 week after discharge. The office is to call him with an appointment.  DISCHARGE DIET AND ACTIVITY:  As tolerated. DD:  08/29/99 TD:  08/30/99 Job: [id number]

## 2010-07-01 NOTE — Op Note (Signed)
[place]  Patient:    Terry Lyons, PINE Visit Number: [id number] MRN: [id number]          Service Type: DSU Location: [facility] Attending Physician:  Georgena Spurling Dictated by:   Georgena Spurling, M.D. Proc. Date: 07/05/01 Admit Date:  07/05/2001 Discharge Date: 07/05/2001                             Operative Report  PREOPERATIVE DIAGNOSES: 1. Right knee medial and lateral meniscus tear. 2. Osteoarthritis.  POSTOPERATIVE DIAGNOSES: 1. Right knee medial and lateral meniscus tear. 2. Osteoarthritis.  OPERATION: 1. Right knee partial medial and lateral meniscectomy. 2. Medial, lateral, and patellofemoral compartment chondroplasty.  SURGEON:  Georgena Spurling, M.D.  ASSISTANT:  Jamelle Rushing, P.A.  ANESTHESIA:  General.  INDICATIONS FOR PROCEDURE:  The patient is a 65 year old with osteoarthritis of the right knee.  Informed consent was obtained.  DESCRIPTION OF PROCEDURE:  The patient was placed supine and administered general anesthesia.  The right lower extremity was prepped and draped in the usual sterile fashion.    Inferolateral and inferomedial portals were placed with #1 blade and cannula.  Diagnostic arthroscopy revealed grade III chondromalacia of the patella, grade III to IV in the trochlea, grade IV over approximately a 3 x 2 cm area of the trochlea, grade III on the majority of the medial femoral condyle, grade IV on the weightbearing surface, grade IV on the posterior aspect of the medial tibial plateau, grade IV on a small area of the lateral tibial plateau, a very large complex tear of the medial meniscus, and small radial tears of the lateral meniscus.  With straight basket forceps, I performed an aggressive partial medial and lateral meniscectomy and used Grierson shaver to remove the debris.  We further debrided back until encountered stable tissue.  I then performed a very aggressive chondroplasty on flap tears on the articular  cartilage of the medial femoral condyle, medial tibial plateau, and lateral tibial plateau.  I then went to extension, performed chondroplasty of the patella and the trochlea.  I then lavaged the knee, made one further tour around all three compartments to make sure that there were no loose bodies in the knee and everything was debrided back adequately.  I then lavaged the knee once again and removed the knee of fluid and instrumentation, closed with interrupted 4-0 nylon sutures, dressed with Xeroform dressing sponges, sterile Webril, and Ace wrap. The patient tolerated the procedure well.   Complications: None. Drains: 1 Hemovac.  EBL: Minimal Dictated by:   Georgena Spurling, M.D. Attending Physician:  Georgena Spurling DD:  07/05/01 TD:  07/08/01 Job: [id number]

## 2010-07-01 NOTE — Cardiovascular Report (Signed)
[place]  Patient:    Terry Lyons, Terry Lyons                           MRN: [id number] Proc. Date: 06/24/99 Adm. Date:  [id number] Disc. Date: [id number] Attending:  Dolores Patty CC:         Arturo Morton. Riley Kill, M.D. [facility]             CV Laboratory             Titus Dubin. Alwyn Ren, M.D. [facility]             Nathen May, M.D., [facility]                        Cardiac Catheterization  INDICATIONS:  The patient is a 65 year old white male who has presented with recurrent chest pain.  He has had chest pain for quite sometime.  The current study was done to access coronary anatomy because of recurrent chest pain.  PROCEDURES: 1. Left heart catheterization. 2. Selective coronary angiography. 3. Selective left ventriculography. 4. Proximal root aortography.  DESCRIPTION OF PROCEDURE:  The procedure was performed from the right femoral artery using 6 French catheters.  He tolerated the procedure without complication.  There were no difficulties.  Intracoronary nitroglycerin was given because of the appearance of myocardial bridging.  After the completion of the procedure, I did review the films with Dr. Juanda Chance and we agreed that medical therapy was warranted.  HEMODYNAMICS:  The central aortic pressure was 145/84. LV pressure 136/19. There was no gradient on pullback across the aortic valve.  ANGIOGRAPHIC DATA:  The left main coronary artery is free of significant disease.  It is large and without significant obstruction.  The left anterior descending artery courses to the apex where it wraps the apical tip and supplies the distal inferior wall.  There is very mild proximal calcification but no significant obstruction.  Beyond the origin of the large bifurcating diagonal branch is an area of narrowing of about 50-70% in the mid vessel.  Notably, there is evidence of systolic compression of this vessel. There is also systolic compression.  There does not  appear to be critical narrowing and this improves somewhat after nitroglycerin.  The distal LAD is without significant focal narrowing.  The diagonal itself is a large caliber bifurcating vessel without critical disease.  There is a small to moderate ramus intermedius that is free of disease.  The circumflex provides two marginal branches, a smaller posterolateral branch and then a moderate sized posterolateral branch.  All of these are free of critical disease.  The right coronary artery is a large caliber vessel providing a posterior descending, a posterolateral branch, both of which appear to be free of critical disease.  LEFT VENTRICULOGRAPHY:  Ventriculography in the RAO projection reveals preserved global systolic function.  No segmental abnormalities contraction are identified.  The aortic root demonstrates no obvious evidence of dissection.  The aortic root is modest in size being about twice the size of the pigtail catheter.  I have reviewed the films with Dr. Juanda Chance.  There does appear to be some systolic compression suggesting a myocardial bridge.  We would add nitrates to his regimen.  We will discontinue his Lovenox.  I have discussed the case with Dr. Graciela Husbands and Dr. Alwyn Ren. DD:  06/24/99 TD:  06/27/99 Job: [id number]

## 2010-08-30 ENCOUNTER — Encounter: Payer: Self-pay | Admitting: Internal Medicine

## 2010-09-07 ENCOUNTER — Encounter: Payer: Self-pay | Admitting: Internal Medicine

## 2010-10-18 ENCOUNTER — Ambulatory Visit (AMBULATORY_SURGERY_CENTER): Payer: Medicare Other

## 2010-10-18 VITALS — Ht 69.0 in | Wt 292.7 lb

## 2010-10-18 DIAGNOSIS — Z86010 Personal history of colonic polyps: Secondary | ICD-10-CM

## 2010-10-18 MED ORDER — NA SULFATE-K SULFATE-MG SULF 17.5-3.13-1.6 GM/177ML PO SOLN: 1.0000 | Freq: Once | ORAL | Status: DC

## 2010-10-28 ENCOUNTER — Other Ambulatory Visit: Payer: Medicare Other | Admitting: Internal Medicine

## 2010-11-04 ENCOUNTER — Encounter: Payer: Self-pay | Admitting: Internal Medicine

## 2010-11-04 ENCOUNTER — Ambulatory Visit (AMBULATORY_SURGERY_CENTER): Payer: Medicare Other | Admitting: Internal Medicine

## 2010-11-04 VITALS — BP 118/75 | HR 82 | Temp 97.7°F | Resp 13 | Ht 69.0 in | Wt 292.0 lb

## 2010-11-04 DIAGNOSIS — D126 Benign neoplasm of colon, unspecified: Secondary | ICD-10-CM

## 2010-11-04 DIAGNOSIS — Z860100 Personal history of colon polyps, unspecified: Secondary | ICD-10-CM

## 2010-11-04 DIAGNOSIS — Z1211 Encounter for screening for malignant neoplasm of colon: Secondary | ICD-10-CM

## 2010-11-04 MED ORDER — SODIUM CHLORIDE 0.9 % IV SOLN: 500.0000 mL | INTRAVENOUS | Status: DC

## 2010-11-04 NOTE — Progress Notes (Signed)
Pt encouraged to deep breath to increase oxygen level.. O2 Increase to 90-93%.

## 2010-11-07 ENCOUNTER — Telehealth: Payer: Self-pay

## 2010-11-07 NOTE — Telephone Encounter (Signed)

## 2010-12-01 ENCOUNTER — Other Ambulatory Visit: Payer: Self-pay | Admitting: Internal Medicine

## 2010-12-02 MED ORDER — GLUCOSE BLOOD VI STRP: ORAL_STRIP | Status: DC

## 2010-12-02 NOTE — Telephone Encounter (Signed)
Spoke with patient, patient stated he would like rx for the onetouch, patient was given a different machine (Precision Xtra) by the [place] and was only provided with 50 strips for the year. Patient stated he would like to continue to use the Onetouch machine, patient requested #100 test strips at a time

## 2010-12-12 ENCOUNTER — Telehealth: Payer: Self-pay | Admitting: Internal Medicine

## 2010-12-12 NOTE — Telephone Encounter (Signed)
Message copied by Mikey Bussing on Mon Dec 12, 2010  1:10 PM ------      Message from: Pecola Lawless      Created: Sun Dec 11, 2010  3:03 PM       Please  schedule fasting Labs : BUN, creat, ALT,AST,Lipids, A1c, urine microalbumin (250.02). Last A1c was 3/13. Appt 2-3 days after labs

## 2010-12-20 ENCOUNTER — Other Ambulatory Visit: Payer: Self-pay | Admitting: Internal Medicine

## 2010-12-21 ENCOUNTER — Other Ambulatory Visit (INDEPENDENT_AMBULATORY_CARE_PROVIDER_SITE_OTHER): Payer: Medicare Other

## 2010-12-21 DIAGNOSIS — IMO0001 Reserved for inherently not codable concepts without codable children: Secondary | ICD-10-CM

## 2010-12-21 LAB — CREATININE, SERUM: Creatinine, Ser: 1 mg/dL (ref 0.4–1.5)

## 2010-12-21 LAB — LIPID PANEL
Total CHOL/HDL Ratio: 4
VLDL: 50.4 mg/dL — ABNORMAL HIGH (ref 0.0–40.0)

## 2010-12-21 LAB — HEMOGLOBIN A1C: Hgb A1c MFr Bld: 6.8 % — ABNORMAL HIGH (ref 4.6–6.5)

## 2010-12-21 LAB — MICROALBUMIN / CREATININE URINE RATIO
Creatinine,U: 98.7 mg/dL
Microalb, Ur: 0.6 mg/dL (ref 0.0–1.9)

## 2010-12-21 LAB — BUN: BUN: 26 mg/dL — ABNORMAL HIGH (ref 6–23)

## 2010-12-21 NOTE — Progress Notes (Signed)
12  

## 2010-12-23 ENCOUNTER — Encounter: Payer: Self-pay | Admitting: Internal Medicine

## 2010-12-26 ENCOUNTER — Encounter: Payer: Self-pay | Admitting: Internal Medicine

## 2010-12-26 ENCOUNTER — Ambulatory Visit (INDEPENDENT_AMBULATORY_CARE_PROVIDER_SITE_OTHER): Payer: Medicare Other | Admitting: Internal Medicine

## 2010-12-26 DIAGNOSIS — K7689 Other specified diseases of liver: Secondary | ICD-10-CM

## 2010-12-26 DIAGNOSIS — E119 Type 2 diabetes mellitus without complications: Secondary | ICD-10-CM | POA: Insufficient documentation

## 2010-12-26 DIAGNOSIS — I10 Essential (primary) hypertension: Secondary | ICD-10-CM

## 2010-12-26 DIAGNOSIS — E785 Hyperlipidemia, unspecified: Secondary | ICD-10-CM

## 2010-12-26 MED ORDER — DOXAZOSIN MESYLATE 4 MG PO TABS: 4.0000 mg | ORAL_TABLET | Freq: Every day | ORAL | Status: DC

## 2010-12-26 MED ORDER — GLUCOSE BLOOD VI STRP: ORAL_STRIP | Status: DC

## 2010-12-26 MED ORDER — METOPROLOL TARTRATE 25 MG PO TABS: 25.0000 mg | ORAL_TABLET | ORAL | Status: DC

## 2010-12-26 MED ORDER — METFORMIN HCL 500 MG PO TABS: 500.0000 mg | ORAL_TABLET | Freq: Every day | ORAL | Status: DC

## 2010-12-26 NOTE — Progress Notes (Signed)
  Subjective:    Patient ID: Terry Lyons, male    DOB: [birth year], 65 y.o.   MRN: [id number]  HPI HYPERTENSION: Disease Monitoring: Blood pressure range-130-140s/ 80s; "I pay attention to lower number"  Chest pain, palpitations- no       Dyspnea- occasional SOB lying back Medications: Compliance- yes  Lightheadedness,Syncope- no    Edema- not with support hose  DIABETES: A1c 6.8; microalb 0.6 Disease Monitoring: Blood Sugar ranges-FBS mid 90s - 159  Polyuria/phagia/dipsia- no       Visual problems- no; last exam 2 mos ago; no retinopathy Medications: Compliance- yes  Hypoglycemic symptoms- no  He believes that the [place] has marked his file as diabetes  related to [employer].  HYPERLIPIDEMIA: Disease Monitoring: See symptoms for Hypertension Medications: Compliance- no statin ; LDL 105; HDL 45.3     ROS:Abd pain, bowel changes- on stool softeners   Muscle aches- occasional        Review of Systems     Objective:   Physical Exam Gen.:  well-nourished, appropriate and alert, weight excess centrally Eyes: No lid/conjunctival changes, extraocular motion intact, fundi benign Neck: Normal range of motion, normal thyroid Respiratory: No increased work of breathing or abnormal breath sounds Cardiac : regular rhythm, no extra heart sounds, gallop, murmur. Slurring of S 4 Abdomen: No organomegaly ,masses, bruits or aortic enlargement Lymph: No lymphadenopathy of the neck or axilla Skin: No rashes, lesions, ulcers or ischemic changes Muscle skeletal: no nail changes; pes planus Vasc:All pulses intact but pedal pulses decreased, no bruits present Neuro: decreased knee deep tendon reflexes, alert & oriented,light touch  sensation over feet normal Psych: judgment and insight, mood and affect normal          Assessment & Plan:  #1 hypertension, adequate control. Goals discussed  #2 diabetes; control is adequate. Significant nutritional components present. This was  discussed  #3 dyslipidemia; LDL slightly above goal. With nutritional interventions and #2; LDL should be a goal.  Plan: See orders and recommendations

## 2010-12-26 NOTE — Patient Instructions (Signed)
Eat a low-fat diet with lots of fruits and vegetables, up to 7-9 servings per day. Avoid obesity; your goal is waist measurement < 40 inches.Consume less than 40 grams of sugar per day from foods & drinks with High Fructose Corn Sugar as #1,2,3 or # 4 on label. Follow the low carb nutrition program in [employer] as closely as possible to prevent Diabetes progression & complications. White carbohydrates (potatoes, rice, bread, and pasta) have a high spike of sugar and a high load of sugar. For example a  baked potato has a cup of sugar and a  french fry  2 teaspoons of sugar. Yams, wild  rice, whole grained bread &  wheat pasta have been much lower spike and load of  sugar. Portions should be the size of a deck of cards or your palm. Blood Pressure Goal  Ideally is an AVERAGE < 135/85. This AVERAGE should be calculated from @ least 5-7 BP readings taken @ different times of day on different days of week. You should not respond to isolated BP readings , but rather the AVERAGE for that week  Diabetes Monitor  The A1c test is used primarily to monitor the glucose control of diabetics over time. The goal of those with diabetes is to keep their blood glucose levels as close to normal as possible. This helps to minimize the complications caused by chronically elevated glucose levels, such as progressive damage to body organs like the kidneys, eyes, cardiovascular system, and nerves. The A1c test gives a picture of the average amount of glucose in the blood over the last few months. It can help a patient and his doctor know if the measures they are taking to control the patient's diabetes are successful or need to be adjusted.      NORMAL VALUES  Non diabetic adults: 5 %-6.1%  Good diabetic control: 6.2-6.4 %  Fair diabetic control: 6.5-7%  Poor diabetic control: greater than 7 % ( except with additional factors such as  advanced age; significant coronary or neurologic disease,etc). Check the A1c   every 4 months as it is   6.5% or higher. Goals for home glucose monitoring are : fasting  or morning glucose goal of  90-150. Two hours after any meal , goal = < 180, preferably < 150. [facility]ric acid @ [facility] or through Rheumatologist

## 2011-02-23 ENCOUNTER — Other Ambulatory Visit: Payer: Self-pay | Admitting: Orthopedic Surgery

## 2011-02-23 DIAGNOSIS — M25512 Pain in left shoulder: Secondary | ICD-10-CM

## 2011-02-26 ENCOUNTER — Ambulatory Visit
Admission: RE | Admit: 2011-02-26 | Discharge: 2011-02-26 | Disposition: A | Discharge status: Home / Self Care | Payer: Medicare Other | Source: Ambulatory Visit | Attending: Orthopedic Surgery | Admitting: Orthopedic Surgery

## 2011-02-26 DIAGNOSIS — M25512 Pain in left shoulder: Secondary | ICD-10-CM

## 2011-05-15 ENCOUNTER — Telehealth: Payer: Self-pay | Admitting: Internal Medicine

## 2011-05-15 ENCOUNTER — Ambulatory Visit (HOSPITAL_COMMUNITY)
Admission: RE | Admit: 2011-05-15 | Discharge: 2011-05-15 | Disposition: A | Discharge status: Home / Self Care | Payer: Medicare Other | Source: Ambulatory Visit | Attending: Family Medicine | Admitting: Family Medicine

## 2011-05-15 ENCOUNTER — Encounter: Payer: Self-pay | Admitting: Family Medicine

## 2011-05-15 ENCOUNTER — Ambulatory Visit (INDEPENDENT_AMBULATORY_CARE_PROVIDER_SITE_OTHER): Payer: Medicare Other | Admitting: Family Medicine

## 2011-05-15 VITALS — BP 120/72 | HR 73 | Temp 98.4°F | Wt 308.2 lb

## 2011-05-15 DIAGNOSIS — R51 Headache: Secondary | ICD-10-CM | POA: Insufficient documentation

## 2011-05-15 DIAGNOSIS — J323 Chronic sphenoidal sinusitis: Secondary | ICD-10-CM | POA: Insufficient documentation

## 2011-05-15 DIAGNOSIS — I999 Unspecified disorder of circulatory system: Secondary | ICD-10-CM | POA: Insufficient documentation

## 2011-05-15 LAB — HEPATIC FUNCTION PANEL
ALT: 43 U/L (ref 0–53)
Total Bilirubin: 0.6 mg/dL (ref 0.3–1.2)

## 2011-05-15 LAB — BASIC METABOLIC PANEL
BUN: 19 mg/dL (ref 6–23)
CO2: 29 mEq/L (ref 19–32)
Chloride: 100 mEq/L (ref 96–112)
Creatinine, Ser: 1.1 mg/dL (ref 0.4–1.5)
Glucose, Bld: 124 mg/dL — ABNORMAL HIGH (ref 70–99)
Potassium: 3.7 mEq/L (ref 3.5–5.1)

## 2011-05-15 LAB — CBC WITH DIFFERENTIAL/PLATELET
Eosinophils Absolute: 0.2 10*3/uL (ref 0.0–0.7)
Eosinophils Relative: 2.6 % (ref 0.0–5.0)
HCT: 41.9 % (ref 39.0–52.0)
Lymphs Abs: 2.5 10*3/uL (ref 0.7–4.0)
MCHC: 33.5 g/dL (ref 30.0–36.0)
MCV: 93.9 fl (ref 78.0–100.0)
Monocytes Absolute: 0.7 10*3/uL (ref 0.1–1.0)
Platelets: 201 10*3/uL (ref 150.0–400.0)
RDW: 15.6 % — ABNORMAL HIGH (ref 11.5–14.6)
WBC: 5.9 10*3/uL (ref 4.5–10.5)

## 2011-05-15 LAB — SEDIMENTATION RATE: Sed Rate: 15 mm/hr (ref 0–22)

## 2011-05-15 MED ORDER — PREDNISONE 10 MG PO TABS: ORAL_TABLET | ORAL | Status: DC

## 2011-05-15 NOTE — Telephone Encounter (Signed)
+   chronic sinusitis Take pred as follows----- 3 po qd for 3 days then 2 po qd for 3 days then 1 po qd for 3 days---then stop ceftin 500 mg 1 po bid for 10 days

## 2011-05-15 NOTE — Telephone Encounter (Signed)
Please advise      KP 

## 2011-05-15 NOTE — Progress Notes (Signed)
  Subjective:    Terry Lyons is a 66 y.o. male who presents for evaluation of headache. Symptoms began about 5 months ago. Generally, the headaches last about 30 min and occur several times per week. The headaches do not seem to be related to any time of day or year. The headaches are usually moderate and sharp and are located in temples, right.  The patient rates his most severe headaches a 6 on a scale from 1 to 10. Recently, the headaches have been increasing in both severity and frequency. Work attendance or other daily activities are not affected by the headaches. Precipitating factors include: none which have been determined. The headaches are usually not preceded by an aura. Associated neurologic symptoms: none. The patient denies decreased physical activity, depression, dizziness, loss of balance, muscle weakness, numbness of extremities, speech difficulties, vision problems, vomiting in the early morning and worsening school/work performance. Home treatment has included acetaminophen, hydrocodone, darkening the room, resting and sleeping with little improvement. Other history includes: migraine headaches diagnosed in the past--- when he smoked.  He has since stopped--> 30 years ago.   . Family history includes no known family members with significant headaches.  The following portions of the patient's history were reviewed and updated as appropriate: allergies, current medications, past family history, past medical history, past social history, past surgical history and problem list.  Review of Systems Pertinent items are noted in HPI.    Objective:    BP 120/72  Pulse 73  Temp(Src) 98.4 F (36.9 C) (Oral)  Wt 308 lb 3.2 oz (139.799 kg)  SpO2 94% General appearance: alert, cooperative, appears stated age and no distress Eyes: conjunctivae/corneas clear. PERRL, EOM's intact. Fundi benign. Throat: lips, mucosa, and tongue normal; teeth and gums normal Neck: no adenopathy, supple,  symmetrical, trachea midline and thyroid not enlarged, symmetric, no tenderness/mass/nodules Lungs: clear to auscultation bilaterally Heart: regular rate and rhythm, S1, S2 normal, no murmur, click, rub or gallop Neurologic: Alert and oriented X 3, normal strength and tone. Normal symmetric reflexes. Normal coordination and gait    Assessment:  suspect  Temporal arteritis    Plan:    Lie in darkened room and apply cold packs as needed for pain. Episodic therapy: oral narcotic analgesics and - due to low frequency of pain. Side effect profile discussed in detail. Asked to keep headache diary. Patient reassured that neurodiagnostic workup not indicated from benign H&P.  pred taper Check labs

## 2011-05-15 NOTE — Telephone Encounter (Signed)
Patient is leaving to go out of town, he will call tomorrow when he gets to [place] with a pharmacy name and number.     KP

## 2011-05-15 NOTE — Telephone Encounter (Signed)
April with [facility] called & stated the directions state 60mg  qd for 4-wks, but qty to dispense is 30 and that is only enough barley get him thru 5-days worth. Please review & either call April at [phone] or send new prescription

## 2011-05-15 NOTE — Patient Instructions (Signed)
Temporal Arteritis Arteries are blood vessels that carry blood from the heart to all parts of the body. Temporal arteritis is a swelling (inflammation) of certain large arteries. This usually affects arteries in the head and neck area, including arteries in the area on the side of the head, between the ears and eyes (temples). The condition can be very painful. It also can cause serious problems, even blindness. Early diagnosis and treatment is very important. CAUSES  Temporal arteritis results from the body reacting to injury or infection (inflammation). This may occur when the body's immune system (which fights germs and disease) makes a mistake. It attacks its own arteries. No one knows why this happens. However, certain things (risk factors) make it more likely that a person will develop temporal arteritis. They include:  Age. Most people with temporal arteritis are older than 50. The average age is 95.   Sex. Three times more women than men develop the condition.   Race and ethnic background. Caucasians are more likely to have temporal arteritis than other races. So are people whose families came from [place] or [place]   Having polymyalgia rheumatica (PMR). This condition causes stiffness and pain in the joints of the neck, shoulders and hips. About 15% of people with PMR also have temporal arteritis.  SYMPTOMS  Not everyone with temporal arteritis has the same symptoms. Some people have just one symptom. Others may have several. The most common symptom is a new headache, often in the temple region. Symptoms may show up in other parts of the body too.   Symptoms affecting the head may include:   Temporal arteries that feel hard or swollen. It may hurt when the temples are touched.   Pain when combing your hair, or when laying your head on a pillow.   Pain in the jaw when chewing.   Pain in the throat or tongue.   Visual problems, including sudden  loss of vision in one eye, or seeing double.   Symptoms in other parts of the body may include:   Fever.   Fatigue.   A dry cough.   Pain in the hips and shoulders.   Pain in the arms during exercise.   Depression.   Weight loss.  DIAGNOSIS  Symptoms of temporal arteritis are similar to symptoms for other conditions. This can make it hard to tell if you have the condition. To be sure, your caregiver will ask about your symptoms and do a physical exam. Certain tests may be necessary, such as:   An exam of your temples. Often, the temporal arteries will be swollen and hard. This can be felt.   A complete blood count. This test shows how many red blood cells are in your blood. Most people with temporal arteritis do not have enough red blood cells (anemic).   Erythrocyte sedimentation (also called sed rate test). It measures inflammation in the body. Almost everyone with temporal arteritis has a high sed rate.   C reactive protein test. This also shows if there is inflammation.   Biopsy a temporal artery. This means the caregiver will take out a small piece of an artery. Then, it is checked under a microscope for inflammation. More than one biopsy may be needed. That is because inflammation can be in one part of an artery and not in others. Your caregiver may need to check more than one spot.  TREATMENT  Starting treatment right away is very important. Often, you will need  to see a specialist in immunologic diseases (rheumatologist). Goals of treatment include protecting your eyesight. Once vision is gone, it might not come back. The normal treatment is medication. It usually works well and quickly. Most people start getting better in a few days. Medication options include:  Corticosteroids. These are powerful drugs that fight inflammation. These drugs are most often used to treat temporal arteritis.   Usually, a high dose is taken at first. After symptoms improve, a smaller dose is  used. The goal is to take the smallest dose possible and still control your symptoms. That is because using corticosteroids for a long time can cause problems. They can make muscles and bones weak. They can cause blood pressure to go up, and cause diabetes. Also, people often gain weight when they take corticosteroids. Corticosteroids may need to be taken for one or two years.   Several newer drugs are being tested to treat temporal arteritis. Researchers are testing to see if new drugs will work as well as corticosteroids, but cause fewer problems than them. Testing of these drugs is not yet complete.   Some specialists recommend low dose aspirin to prevent blood clots.  HOME CARE INSTRUCTIONS   Take any corticosteroids that your caregiver prescribes. Follow the directions carefully.   Take any vitamins or supplements that your caregiver suggests. This may include vitamin D and calcium. They help keep your bones from becoming weak.   Keep all appointments for checkups. Your caregiver will watch for any problems from the medication. Checkups may include:   Periodic blood tests.   Bone density testing. This checks how strong or weak your bones are.   Blood pressure checks. If your blood pressure rises, you may need to take a drug to control it while you are taking corticosteroids.   Blood sugar checks. This is to be sure you are not developing diabetes. If you have diabetes, corticosteroid medications may make it worse and require increased treatment.   Exercise. First, talk with your caregiver about what would be OK for you to do. Aerobic exercise (which increases your heart rate) is usually suggested. It does not have to require a lot of energy. Walking is aerobic exercise. This type of exercise is good because it helps prevent bone loss. It also helps control your blood pressure.   Follow a healthy diet. The goal is to prevent bone damage and diabetes. Include good sources of protein in your  diet. Also, include fruits, vegetables and whole grains. Your caregiver can refer you to an expert on healthy eating (dietitian) for more advice.  SEEK MEDICAL CARE IF:   The symptoms that lead to your diagnosis return.   You develop worsening fever, fatigue, headache, weight loss, or pain in your jaw.   You develop signs of infection. Infections can be worse if you are on corticosteroid medication.  SEEK IMMEDIATE MEDICAL CARE IF:   Your eyesight changes.   Pain does not go away, even after taking pain medicine.   You feel pain in your chest.   Breathing is difficult.   One side of your face or body suddenly becomes weak or numb.   You develop a fever of more than 102 F (38.9 C).  Document Released: 11/27/2008 Document Revised: 01/19/2011 Document Reviewed: 11/27/2008 [facility] Patient Information 2012 [place], [place].

## 2011-05-15 NOTE — Telephone Encounter (Signed)
[facility] Triage Call Report Triage Record Num: [id number] Operator: Aundra Millet Patient Name: Terry Lyons Call Date & Time: 05/15/2011 8:39:46AM Patient Phone: [phone] PCP: Marga Melnick Patient Gender: Male PCP Fax : [phone] Patient DOB: August 25, 1945 Practice Name: [facility] Reason for Call: Caller: Joan/Spouse; PCP: Marga Melnick; CB#: [phone]; ; ; Call regarding Pain in Right Temple; Right temporal throbbbing , stabbing pain onset last night, 05/14/2011 that was painful to the touch. Took ASA and Hydrocodone along with application of cold pak , and was able to sleep through the night. This morning has had reoccurence of right temporal pain 4/10, and is better with applying heat and ASA again. RN reached See in 4 hrs DISP for new tenderness over temporal area per Headache protocol - Appt sched for 1130 with Dr Laury Axon this morning. Protocol(s) Used: Headache Recommended Outcome per Protocol: See Provider within 4 hours Reason for Outcome: New tenderness over temporal area Care Advice: ~ Another adult should drive. ~ Do not give the patient anything to eat or drink. ~ Do not take aspirin for headache until discussing with your provider. Call EMS 911 if any of the following develop: sudden changes in vision including blindness or double vision; new confusion or disorientation; any difficulty speaking, difficulty walking or using an arm or leg, severe numbness on one side of the face, body or any extremities. ~ ~ IMMEDIATE ACTION

## 2011-05-15 NOTE — Telephone Encounter (Signed)
Please call pt at mobile # [phone].

## 2011-05-15 NOTE — Telephone Encounter (Signed)
Seen today by Dr Laury Axon

## 2011-05-16 ENCOUNTER — Telehealth: Payer: Self-pay | Admitting: Internal Medicine

## 2011-05-16 MED ORDER — CEFUROXIME AXETIL 500 MG PO TABS: 500.0000 mg | ORAL_TABLET | Freq: Two times a day (BID) | ORAL | Status: AC

## 2011-05-16 NOTE — Telephone Encounter (Signed)
Patients wife Terry Lyons called & stated her husband spoke to someone in the office late yesterday regarding another antibiotic he should take. She does not know what that should be and would like to get that called in as soon as possible. Also she needs clarification on how he is to take the PredniSONE (Tab) Both are out of town in [place]., please call on cell [phone]

## 2011-05-16 NOTE — Telephone Encounter (Signed)
This was addressed in previous phone----see phone note--prednisone instructions and abx ordered---we just needed pharmacy

## 2011-05-16 NOTE — Telephone Encounter (Signed)
Discussed with wife and she voiced understanding we discussed the directions to the prednisone and the Abx and she voiced understanding.     KP

## 2011-05-16 NOTE — Telephone Encounter (Signed)
Discussed with patient and his wife.      KP

## 2011-05-16 NOTE — Telephone Encounter (Signed)
Please call Aurea Graff at  [phone] ext [phone], has questions regarding medications given New pharmacy for new antibiotic  [facility] in [place], [place] ph# [phone]

## 2011-05-16 NOTE — Telephone Encounter (Signed)
See previous phone note. Patient seen Dr.Lowne

## 2011-07-11 ENCOUNTER — Ambulatory Visit (INDEPENDENT_AMBULATORY_CARE_PROVIDER_SITE_OTHER): Payer: Medicare Other | Admitting: Family Medicine

## 2011-07-11 ENCOUNTER — Encounter: Payer: Self-pay | Admitting: Family Medicine

## 2011-07-11 VITALS — BP 118/70 | HR 78 | Temp 98.9°F | Wt 299.6 lb

## 2011-07-11 DIAGNOSIS — R5383 Other fatigue: Secondary | ICD-10-CM

## 2011-07-11 DIAGNOSIS — R5381 Other malaise: Secondary | ICD-10-CM

## 2011-07-11 DIAGNOSIS — Z125 Encounter for screening for malignant neoplasm of prostate: Secondary | ICD-10-CM

## 2011-07-11 DIAGNOSIS — J329 Chronic sinusitis, unspecified: Secondary | ICD-10-CM

## 2011-07-11 LAB — POCT URINALYSIS DIPSTICK
Blood, UA: NEGATIVE
Ketones, UA: NEGATIVE
Protein, UA: NEGATIVE
Spec Grav, UA: 1.015
Urobilinogen, UA: 0.2
pH, UA: 6

## 2011-07-11 MED ORDER — MOXIFLOXACIN HCL 400 MG PO TABS: 400.0000 mg | ORAL_TABLET | Freq: Every day | ORAL | Status: DC

## 2011-07-11 NOTE — Patient Instructions (Signed)

## 2011-07-11 NOTE — Progress Notes (Signed)
  Subjective:     Terry Lyons is a 66 y.o. male who presents for evaluation of sinus pain. Symptoms include: congestion, facial pain, headaches, nasal congestion and sinus pressure. Onset of symptoms was 6 weeks ago. Symptoms have been unchanged since that time. Past history is significant for no history of pneumonia or bronchitis. Patient is a non-smoker. Pt is also complains about feeling tired-- his words "cruddy"----his brother in law has low T and he would like to be tested.  The following portions of the patient's history were reviewed and updated as appropriate: allergies, current medications, past family history, past medical history, past social history, past surgical history and problem list.  Review of Systems Pertinent items are noted in HPI.   Objective:    BP 118/70  Pulse 78  Temp(Src) 98.9 F (37.2 C) (Oral)  Wt 299 lb 9.6 oz (135.898 kg)  SpO2 95% General appearance: alert, cooperative, appears stated age and no distress Ears: normal TM's and external ear canals both ears Nose: green discharge, moderate congestion, sinus tenderness bilateral Throat: abnormal findings: mild oropharyngeal erythema Neck: mild anterior cervical adenopathy and thyroid not enlarged, symmetric, no tenderness/mass/nodules Lungs: clear to auscultation bilaterally    Assessment:    Acute bacterial sinusitis.   fatigue--- check testosterone level Plan:    Nasal steroids per medication orders. Antihistamines per medication orders. avelox per medication orders.

## 2011-07-12 LAB — TESTOSTERONE, FREE, TOTAL, SHBG
Testosterone-% Free: 1.7 % (ref 1.6–2.9)
Testosterone: 154.47 ng/dL — ABNORMAL LOW (ref 300–890)

## 2011-07-14 DIAGNOSIS — R7989 Other specified abnormal findings of blood chemistry: Secondary | ICD-10-CM

## 2011-08-24 ENCOUNTER — Other Ambulatory Visit: Payer: Self-pay | Admitting: *Deleted

## 2011-08-24 MED ORDER — ALLOPURINOL 300 MG PO TABS: 300.0000 mg | ORAL_TABLET | Freq: Every day | ORAL | Status: DC

## 2011-08-24 MED ORDER — METOPROLOL TARTRATE 25 MG PO TABS: 25.0000 mg | ORAL_TABLET | ORAL | Status: DC

## 2011-08-24 MED ORDER — METFORMIN HCL 500 MG PO TABS: 500.0000 mg | ORAL_TABLET | Freq: Every day | ORAL | Status: DC

## 2011-08-24 MED ORDER — DOXAZOSIN MESYLATE 4 MG PO TABS: 4.0000 mg | ORAL_TABLET | Freq: Every day | ORAL | Status: DC

## 2011-08-24 MED ORDER — CETIRIZINE HCL 10 MG PO TABS: 10.0000 mg | ORAL_TABLET | Freq: Every day | ORAL | Status: DC

## 2011-08-24 MED ORDER — CYCLOBENZAPRINE HCL 5 MG PO TABS: 5.0000 mg | ORAL_TABLET | Freq: Every day | ORAL | Status: DC | PRN

## 2011-08-24 MED ORDER — MELOXICAM 7.5 MG PO TABS: 7.5000 mg | ORAL_TABLET | Freq: Every day | ORAL | Status: DC

## 2011-08-24 NOTE — Telephone Encounter (Signed)
Refill done.  

## 2011-08-25 ENCOUNTER — Telehealth: Payer: Self-pay | Admitting: Internal Medicine

## 2011-08-25 MED ORDER — ALLOPURINOL 300 MG PO TABS: ORAL_TABLET | ORAL | Status: DC

## 2011-08-25 NOTE — Telephone Encounter (Signed)
Pt was in pharmacy at time of call but has since left. Pharmacy received Rx E-scribe and would like to know correct sig on how Pt is to take med. Per sig in chart Pt to take Take 1 tablet (300 mg total) by mouth daily. Take 1 1/2 tablet daily. Per tech pharmacist is not comfortable with given Pt med without clarification from pharmacy..Please advise

## 2011-08-25 NOTE — Telephone Encounter (Signed)
Verbally advise pharmacy.

## 2011-08-25 NOTE — Telephone Encounter (Signed)
Please call regarding medication clarification for the following Allopurinol (Tab) ZYLOPRIM 300 MG -sent 08-24-11  pharmacist name April ph# [phone]

## 2011-08-25 NOTE — Telephone Encounter (Signed)
Old rx says 1 1/2

## 2011-08-26 NOTE — Telephone Encounter (Signed)
This was Rxed by MD treating his gout; renal function has been normal.

## 2012-03-05 ENCOUNTER — Encounter: Payer: Self-pay | Admitting: Lab

## 2012-03-06 ENCOUNTER — Encounter: Payer: Self-pay | Admitting: Internal Medicine

## 2012-03-06 ENCOUNTER — Ambulatory Visit (INDEPENDENT_AMBULATORY_CARE_PROVIDER_SITE_OTHER): Payer: Medicare Other | Admitting: Internal Medicine

## 2012-03-06 VITALS — BP 135/90 | HR 82 | Temp 98.2°F | Resp 14 | Ht 69.0 in | Wt 297.0 lb

## 2012-03-06 DIAGNOSIS — Z87898 Personal history of other specified conditions: Secondary | ICD-10-CM | POA: Insufficient documentation

## 2012-03-06 DIAGNOSIS — E349 Endocrine disorder, unspecified: Secondary | ICD-10-CM | POA: Insufficient documentation

## 2012-03-06 DIAGNOSIS — I10 Essential (primary) hypertension: Secondary | ICD-10-CM

## 2012-03-06 DIAGNOSIS — Z0000 Encounter for general adult medical examination without abnormal findings: Secondary | ICD-10-CM

## 2012-03-06 DIAGNOSIS — E291 Testicular hypofunction: Secondary | ICD-10-CM

## 2012-03-06 DIAGNOSIS — R5383 Other fatigue: Secondary | ICD-10-CM

## 2012-03-06 DIAGNOSIS — R5381 Other malaise: Secondary | ICD-10-CM

## 2012-03-06 DIAGNOSIS — E119 Type 2 diabetes mellitus without complications: Secondary | ICD-10-CM

## 2012-03-06 LAB — TSH: TSH: 1.12 u[IU]/mL (ref 0.35–5.50)

## 2012-03-06 MED ORDER — GLUCOSE BLOOD VI STRP: ORAL_STRIP | Status: DC

## 2012-03-06 NOTE — Patient Instructions (Addendum)
Pharmaceutical companies are aggressively marketing testosterone replacement to men to treat "low T". Clinical symptoms of low testosterone are profound weakness and significantly decreased libido. If  erectile dysfunction  Is not improved with agents such as Viagra, Levitra, or Cialis ; adding testosterone does NOT correct the erectile dysfunction. The major causes of erectile dysfunction are  hypertension, cholesterol & diabetes which are uncontrolled and smoking. A small number of studies sponsored by the pharmaceutical industry show some short term benefits in selected groups of men with low testosterone but there are no long-term safety studies. A study in the [place] Journal of Medicine( 2010; 161:096) revealed increased risk of heart attack, stroke, or death in men receiving testosterone replacement if they have associated cardiovascular risk factors. Cardiovascular factors include smoking, hypertension, diabetes, and elevated cholesterol. We simply do not know the risk of long term hormonal therapy in men. Of concern is that hormonal ( estrogen ) replacement in women  was associated with increased cardiovascular risk if cardiovascular risks were present as docummentred in the long term  Women's Health Initiative study.Estrogen replacement ,which had been the "standard" of care, has become the exception with very strict treatment criteria. The screening Testosterone  test may be affected by diet & other medications & supplements.The confirmatory test is a fasting  Free Testosterone which is unaffected by these factors. As preventive healthcare; the most important intervention is to address reversible risk factors  described above. If erectile dysfunction is the real issue ; stopping smoking and control of blood pressure,glucose & cholesterol is the solution , not testosterone replacement.  If free testosterone levels are truly low and symptoms of muscle weakness and libido are present;  supplementation could be considered. Erectile agents do not address those symptoms. Critical would be to actively  address the risk factors  rather than simply taking testosterone replacement with its associated potential risks.  I would be happy to discuss these issues in person @ an office appointment or I could refer you to an Endocrinologist or Urologist for a second opinion  if desired . My concern is your long term health & prevention of serious complications of unaddressed risk factors. Hopp   The most common cause of elevated triglycerides is the ingestion of sugar from high fructose corn syrup sources added to processed foods & drinks.  Eat a low-fat diet with lots of fruits and vegetables, up to 7-9 servings per day. Consume less than 40 (preferably ZERO) grams of sugar per day from foods & drinks with High Fructose Corn Syrup (HFCS) sugar as #1,2,3 or # 4 on label.Whole Foods, [employer] & [employer] do not carry products with HFCS. Follow a  low carb nutrition program such as [place] or The New Sugar Busters  to prevent Diabetes progression . White carbohydrates (potatoes, rice, bread, and pasta) have a high spike of sugar and a high load of sugar. For example a  baked potato has a cup of sugar and a  french fry  2 teaspoons of sugar. Yams, wild  rice, whole grained bread &  wheat pasta have been much lower spike and load of  sugar. Portions should be the size of a deck of cards or your palm.   If you activate My Chart; the results can be released to you as soon as they populate from the lab. If you choose not to use this program; the labs have to be reviewed, copied & mailed   causing a delay in getting the  results to you.

## 2012-03-06 NOTE — Progress Notes (Signed)
Subjective:    Patient ID: Terry Lyons, male    DOB: Dec 12, 1945, 67 y.o.   MRN: [id number]  HPI Medicare Wellness Visit:  Psychosocial & medical history were reviewed as required by Medicare (abuse,antisocial behavioral risks,firearm risk).  Social history: caffeine: 2 cups coffee & 3 cups tea /day   , alcohol: no  ,  tobacco use: quit 1975   Exercise :  No due to knees No home & personal  safety / fall risk Activities of daily living: no limitations  Seatbelt  and smoke alarm employed. Power of Attorney/Living Will status : in place Ophthalmology exam current Hearing evaluation not current Orientation :oriented X 3  Memory & recall :good Math testing:good Mood & affect : normal . Depression / anxiety: denied Travel history : 2008 [place]   Immunization status :Shingles / PNA/ tetanus current.No flu Transfusion history:  none  Preventive health surveillance ( colonoscopy as per protocol/ [facility]): current  Dental care:  Every 12 mos. Chart reviewed &  Updated. Active issues reviewed & addressed.       Review of Systems HYPERTENSION: Disease Monitoring: Blood pressure range-130s/low 80s Exertional chest pain, palpitations- no       Dyspnea- no Medications: Compliance- yes but Doxazosin out 7 days  Lightheadedness,Syncope- no   Edema- from knees down  FASTING HYPERGLYCEMIA, PMH of:  Polyuria/phagia/dipsia- no       Visual problems- no  HYPERLIPIDEMIA: Disease Monitoring: See symptoms for Hypertension Medications: Compliance-no Statin to date Abd pain, bowel changes- no   Muscle aches- no   Labs [facility] at the [facility]22[facility] were reviewed. A1c was 6.6%; uric acid 6; creatinine 1.1; HDL 45; TG 258; LDL 102.    Objective:   Physical Exam Gen.: Adequately nourished but central weight excess. Alert, appropriate and cooperative throughout exam.  Head: Normocephalic without obvious abnormalities  Eyes: No corneal or conjunctival inflammation noted. Extraocular motion intact.  Vision grossly normal. Ears: External  ear exam reveals no significant lesions or deformities. Canals clear .TMs normal. Hearing is grossly normal bilaterally. Nose: External nasal exam reveals no deformity or inflammation. Nasal mucosa are pink and moist. No lesions or exudates noted.   Mouth: Oral mucosa and oropharynx reveal no lesions or exudates. Teeth in good repair. Neck: No deformities, masses, or tenderness noted. Range of motion & Thyroid normal. Lungs: Normal respiratory effort; chest expands symmetrically. Lungs are clear to auscultation without rales, wheezes, or increased work of breathing. Heart: Normal rate and rhythm. Normal S1 and S2. No gallop, click, or rub. No murmur. Heart sounds slightly decreased Abdomen: Bowel sounds normal; abdomen soft and nontender. No masses, organomegaly or hernias noted. Protuberant Genitalia: PSA & colonoscopy in 2013                                    Musculoskeletal/extremities: No deformity or scoliosis noted of  the thoracic or lumbar spine. No clubbing, cyanosis,  or significant extremity  deformity noted. Range of motion decreased @ knees. Crepitus L >R.Tone & strength normal. Mild toe nail changes. Able to lie down & sit up w/o help.  Vascular: Carotid, radial artery, dorsalis pedis and  posterior tibial pulses are full and equal. No bruits present. Neurologic: Alert and oriented x3. Deep tendon reflexes symmetrical but 0+ @ knees.  Skin: Intact without suspicious lesions or rashes. Lymph: No cervical, axillary lymphadenopathy present. Psych: Mood and affect are normal. Normally interactive  Assessment & Plan:  #1 Medicare Wellness Exam; criteria met ; data entered #2 Problem List reviewed ; Assessment/ Recommendations made Plan: see Orders

## 2012-03-27 ENCOUNTER — Encounter: Payer: Self-pay | Admitting: Internal Medicine

## 2012-05-16 ENCOUNTER — Other Ambulatory Visit: Payer: Self-pay | Admitting: Internal Medicine

## 2012-05-16 NOTE — Telephone Encounter (Signed)
Bun/Creat/Uric Acid- DX Gout/995.20

## 2012-05-27 ENCOUNTER — Telehealth: Payer: Self-pay | Admitting: Internal Medicine

## 2012-05-27 MED ORDER — ALLOPURINOL 300 MG PO TABS: ORAL_TABLET | ORAL | Status: DC

## 2012-05-27 MED ORDER — METFORMIN HCL 500 MG PO TABS: 500.0000 mg | ORAL_TABLET | Freq: Every day | ORAL | Status: DC

## 2012-05-27 MED ORDER — METOPROLOL TARTRATE 25 MG PO TABS: 25.0000 mg | ORAL_TABLET | ORAL | Status: DC

## 2012-05-27 MED ORDER — MELOXICAM 7.5 MG PO TABS: 7.5000 mg | ORAL_TABLET | Freq: Every day | ORAL | Status: DC

## 2012-05-27 NOTE — Telephone Encounter (Signed)
Patients wife called in requesting a 90 day supply on these medications to be sent to [facility] pharmacy  Allopurinol  meloxicam  Chlorthalidone  Metformin  metoprolol

## 2012-05-27 NOTE — Telephone Encounter (Signed)
Left message on VM for patient's wife, please call to clarify who rx'ed Chlorthalidone? Dose? Instruction?, I will then get it approved by Dr.Hopper to send in rx for patient. All other medications refilled as requested at [facility]

## 2012-05-27 NOTE — Telephone Encounter (Signed)
Spoke with patients wife, Chlorthalidone 25mg  1 by mouth daily was rx'ed by the [place]. Patient would like to know if Dr.Hopper will RX, Hopp please advise

## 2012-05-27 NOTE — Telephone Encounter (Signed)
I recommend only a 30 day prescription of chlorthalidone unless he has current kidney function (BMET) from the [place]. If he has a normal BMET; we could order 90 days.

## 2012-05-27 NOTE — Telephone Encounter (Signed)
All medications filled EXCEPT Chlorthalidone(not on medication list), I will f/u with patient's wife to clarify who started patient on medication. Patient is also due for A1c this month, last checked 12/2011 (6.6)-per Dr.Hopper if greater than 6.5 needs to be rechecked every 4 months, if less that 6.5 recheck every 6 months .

## 2012-05-28 NOTE — Telephone Encounter (Signed)
PT wife will speak with Pt to see if he needs med now if not will hold off until labs done. Pt wife to call back and let us know if Rx needs to be sent.

## 2012-05-29 ENCOUNTER — Other Ambulatory Visit (INDEPENDENT_AMBULATORY_CARE_PROVIDER_SITE_OTHER): Payer: Medicare Other

## 2012-05-29 DIAGNOSIS — E119 Type 2 diabetes mellitus without complications: Secondary | ICD-10-CM

## 2012-05-29 DIAGNOSIS — T887XXA Unspecified adverse effect of drug or medicament, initial encounter: Secondary | ICD-10-CM

## 2012-05-29 LAB — BASIC METABOLIC PANEL
CO2: 29 mEq/L (ref 19–32)
Chloride: 98 mEq/L (ref 96–112)
Creatinine, Ser: 1.2 mg/dL (ref 0.4–1.5)
Sodium: 137 mEq/L (ref 135–145)

## 2012-05-29 LAB — HEMOGLOBIN A1C: Hgb A1c MFr Bld: 6.6 % — ABNORMAL HIGH (ref 4.6–6.5)

## 2012-05-29 LAB — BUN: BUN: 16 mg/dL (ref 6–23)

## 2012-05-29 LAB — CREATININE, SERUM: Creatinine, Ser: 1.2 mg/dL (ref 0.4–1.5)

## 2012-06-03 ENCOUNTER — Telehealth: Payer: Self-pay | Admitting: *Deleted

## 2012-06-03 MED ORDER — CHLORTHALIDONE 25 MG PO TABS: 25.0000 mg | ORAL_TABLET | Freq: Every day | ORAL | Status: DC

## 2012-06-03 NOTE — Telephone Encounter (Signed)
Rx sent 

## 2012-06-03 NOTE — Telephone Encounter (Signed)
Message copied by Verdene Rio on Mon Jun 03, 2012  9:43 AM ------      Message from: Pecola Lawless      Created: Fri May 31, 2012  2:17 PM       Refill Chlorthalidone  #90; see labs  ------

## 2012-09-04 ENCOUNTER — Encounter: Payer: Self-pay | Admitting: Internal Medicine

## 2012-09-04 ENCOUNTER — Ambulatory Visit (INDEPENDENT_AMBULATORY_CARE_PROVIDER_SITE_OTHER): Payer: Medicare Other | Admitting: Internal Medicine

## 2012-09-04 ENCOUNTER — Ambulatory Visit (INDEPENDENT_AMBULATORY_CARE_PROVIDER_SITE_OTHER)
Admission: RE | Admit: 2012-09-04 | Discharge: 2012-09-04 | Disposition: A | Discharge status: Home / Self Care | Payer: Medicare Other | Source: Ambulatory Visit | Attending: Internal Medicine | Admitting: Internal Medicine

## 2012-09-04 VITALS — BP 138/80 | HR 61 | Wt 307.2 lb

## 2012-09-04 DIAGNOSIS — M5412 Radiculopathy, cervical region: Secondary | ICD-10-CM

## 2012-09-04 DIAGNOSIS — R635 Abnormal weight gain: Secondary | ICD-10-CM

## 2012-09-04 DIAGNOSIS — E119 Type 2 diabetes mellitus without complications: Secondary | ICD-10-CM

## 2012-09-04 MED ORDER — CETIRIZINE HCL 10 MG PO TABS: 10.0000 mg | ORAL_TABLET | Freq: Every day | ORAL | Status: DC

## 2012-09-04 NOTE — Patient Instructions (Addendum)
Order for x-rays entered into  the computer; these will be performed at [address] [facility] across from [facility]. No appointment is necessary.  If you activate the  My Chart system; lab & Xray results will be released directly  to you as soon as I review & address these through the computer. If you choose not to sign up for My Chart within 36 hours of labs being drawn; results will be reviewed & interpretation added before being copied & mailed, causing a delay in getting the results to you.If you do not receive that report within 7-10 days ,please call. Additionally you can use this system to gain direct  access to your records  if  out of town or @ an office of a  physician who is not in  the My Chart network.  This improves continuity of care & places you in control of your medical record.

## 2012-09-04 NOTE — Progress Notes (Signed)
Subjective:    Patient ID: Terry Lyons, male    DOB: 01-23-1946, 67 y.o.   MRN: [id number]  HPI  He has had tingling for at least one year in the left upper extremity from the left shoulder to the elbow level or even into the left thumb, index finger and second digit. There was no specific trigger or injury although he did have an intra-articular steroid injection in the left shoulder prior to onset of symptoms.  This is positional and intermittent. Arm positions such as hugging his wife can make it worse  Over the last week he's also had numbness in the retro-auricular area on the right as high as the top of the ear and radiating to as far as the right shoulder and anteriorly to the  lip  He noticed numbness in the lips Monday night 7/21 for 3 -4 min.  All the symptoms are intermittent.  The retroauricular numbness does improve with position change of head & neck  PMH of angioedema; no specific etiology. On Metformin for DM. PMH of esophageal dilation X 3. No history of cervical spine injury or surgery.  He believes that he had thyroid function test and A1c done last month at the [place]. He states his A1c was 6.5%.    Review of Systems   He denies any constitutional symptoms such as fever, chills, sweats,or  weight loss. He has gained 12 # over 2-3 weeks  He has no blurred vision, double vision, loss of vision  He has had chronic tinnitus; his hearing was recently evaluated the [place].  The symptoms are not related to any neurologic or Cardiologic prodrome. Specifically he notes no change in heart rhythm or rate.  He has no hoarseness ; occasional difficulty swallowing dry bread.  He denies any significant muscle weakness in extremities  He also has no significant tremor or gait dysfunction.  He's noted no enlarged lymph nodes or abnormal bruising/bleeding.       Objective:   Physical Exam Gen.:  well-nourished in appearance.Weight excess. Alert, appropriate and cooperative  throughout exam.  Head: Normocephalic without obvious abnormalities  Eyes: No corneal or conjunctival inflammation noted.FOV WNL. Extraocular motion intact. Vision grossly normal with lenses Ears: External  ear exam reveals no significant lesions or deformities. Canals clear .TMs normal. Hearing is grossly normal bilaterally. Tuning fork normal Nose: External nasal exam reveals no deformity or inflammation. Nasal mucosa are pink and moist. No lesions or exudates noted.   Mouth: Oral mucosa and oropharynx reveal no lesions or exudates. Teeth in good repair. Neck: No deformities, masses, or tenderness noted. Range of motion decreased. Thyroid normal. Lungs: Normal respiratory effort; chest expands symmetrically. Lungs are clear to auscultation without rales, wheezes, or increased work of breathing. Heart: Normal rate and rhythm. Normal S1 and S2. No gallop, click, or rub. No murmur.                                   Musculoskeletal/extremities: No deformity or scoliosis noted of  the thoracic or lumbar spine.  No clubbing, cyanosis, edema, or significant extremity  deformity noted. Range of motion normal .Tone & strength  Normal. Joints normal. Nail health good. Vascular: Carotid, radial artery, dorsalis pedis and  posterior tibial pulses are full and equal. No bruits present. Neurologic: Alert and oriented x3. Deep tendon reflexes symmetrical but 0+ @ knees. Light touch normal over face bilaterally. Sensation normal to  light touch over the forearms and hands Gait normal  including heel & toe walking .        Skin: Intact without suspicious lesions or rashes. Lymph: No cervical, axillary lymphadenopathy present. Psych: Mood and affect are normal. Normally interactive                                                                                        Assessment & Plan:  #1 tingling and C6-7 distribution on the left  #2 C 2-5 radicular symptoms on the right  #3 weight gain,  accelerated  #4 diabetes  Plan: See orders recommendations

## 2012-12-18 ENCOUNTER — Encounter: Payer: Self-pay | Admitting: Internal Medicine

## 2012-12-18 ENCOUNTER — Ambulatory Visit: Payer: Medicare Other

## 2012-12-18 ENCOUNTER — Ambulatory Visit (INDEPENDENT_AMBULATORY_CARE_PROVIDER_SITE_OTHER): Payer: Medicare Other | Admitting: Internal Medicine

## 2012-12-18 VITALS — BP 154/83 | HR 74 | Temp 98.5°F

## 2012-12-18 DIAGNOSIS — J329 Chronic sinusitis, unspecified: Secondary | ICD-10-CM

## 2012-12-18 MED ORDER — AZELASTINE HCL 0.1 % NA SOLN: 2.0000 | Freq: Every day | NASAL | Status: DC

## 2012-12-18 MED ORDER — AMOXICILLIN 500 MG PO CAPS: 1000.0000 mg | ORAL_CAPSULE | Freq: Two times a day (BID) | ORAL | Status: DC

## 2012-12-18 NOTE — Progress Notes (Signed)
  Subjective:    Patient ID: Terry Lyons, male    DOB: [birth year], 67 y.o.   MRN: [id number]  HPI Acute visit 4 months history of nocturnal postnasal dripping, he wakes up in the morning and has mucus accumulated  In the throat which he coughs up, green in color.. For the last 3 days, symptoms are somehow increased, has bilateral temporal headaches and more cough than usual. \ Past Medical History  Diagnosis Date  . GERD (gastroesophageal reflux disease)   . Gout   . History of basal cell carcinoma     W-S Derm  . History of squamous cell carcinoma   . Hyperlipidemia   . Tachycardia   . Hypertension   . Allergy   . Rosacea   . Diabetes mellitus    Past Surgical History  Procedure Laterality Date  . Tonsillectomy    . Pilonidal cyst excision    . Knee arthroscopy      R  . Colonoscopy with polypectomy      X3  . Esophagus dilated      X3  . Shoulder surgery      right shoulder  . Total knee arthroplasty      RIGHT  . Knee arthroscopy      bilaterally     Review of Systems Mild sore throat for last week, no ear discomfort, mild chest congestion and wheezing. No myalgias, no vomiting or diarrhea. From time to time has nausea which is not a new issue. Also one week history of itchy eyes ; no nasal itching    Objective:   Physical Exam  BP 154/83  Pulse 74  Temp(Src) 98.5 F (36.9 C)  SpO2 96% General -- alert, well-developed, NAD.  HEENT-- Not pale. TMs normal, throat symmetric, no redness or discharge. Face symmetric, sinuses not tender to palpation. Nose slt  congested.  Lungs -- normal respiratory effort, no intercostal retractions, no accessory muscle use, and normal breath sounds.  Heart-- normal rate, regular rhythm, no murmur.  Extremities-- no pretibial edema bilaterally  Neurologic--  alert & oriented X3. Speech normal, gait normal, strength normal in all extremities.   Psych-- Cognition and judgment appear intact. Cooperative with normal attention span  and concentration. No anxious appearing , no depressed appearing.     Assessment & Plan:

## 2012-12-18 NOTE — Patient Instructions (Signed)
Rest, fluids , tylenol nasocort AQ OTC 2 sprays on each side of the nose every morning astelin 2 sprays on each side of the nose every night For cough, take Mucinex DM twice a day as needed  Take the antibiotic as prescribed  (Amoxicillin) Call if no better in few days Call anytime if the symptoms are severe

## 2012-12-19 ENCOUNTER — Other Ambulatory Visit: Payer: Self-pay

## 2012-12-19 ENCOUNTER — Encounter: Payer: Self-pay | Admitting: Internal Medicine

## 2012-12-19 DIAGNOSIS — J329 Chronic sinusitis, unspecified: Secondary | ICD-10-CM | POA: Insufficient documentation

## 2012-12-19 NOTE — Assessment & Plan Note (Signed)
Chronic sinusitis, 4 months history of postnasal dripping, CT of the head last year showed findings consistent with chronic sinusitis. He also seems to have an acute infection for the last few days. Plan: Nasacort, Astelin for treatment of chronic sinusitis, he is already taking Zyrtec. Amoxicillin for the acute infection. See instructions.

## 2012-12-25 ENCOUNTER — Ambulatory Visit (INDEPENDENT_AMBULATORY_CARE_PROVIDER_SITE_OTHER): Payer: Medicare Other

## 2012-12-25 DIAGNOSIS — Z23 Encounter for immunization: Secondary | ICD-10-CM

## 2013-02-03 ENCOUNTER — Ambulatory Visit: Payer: Self-pay | Admitting: Podiatry

## 2013-02-14 ENCOUNTER — Encounter: Payer: Self-pay | Admitting: Podiatrist

## 2013-02-14 ENCOUNTER — Telehealth: Payer: Self-pay | Admitting: *Deleted

## 2013-02-14 ENCOUNTER — Ambulatory Visit (INDEPENDENT_AMBULATORY_CARE_PROVIDER_SITE_OTHER): Payer: Medicare Other | Admitting: Podiatrist

## 2013-02-14 VITALS — BP 159/84 | HR 71 | Resp 16

## 2013-02-14 DIAGNOSIS — L02619 Cutaneous abscess of unspecified foot: Secondary | ICD-10-CM

## 2013-02-14 DIAGNOSIS — L03039 Cellulitis of unspecified toe: Secondary | ICD-10-CM

## 2013-02-14 MED ORDER — CEPHALEXIN 500 MG PO CAPS: 500.0000 mg | ORAL_CAPSULE | Freq: Three times a day (TID) | ORAL | Status: DC

## 2013-02-14 NOTE — Patient Instructions (Signed)
Soak in epsom salt water soaks for 3-4 days.

## 2013-02-14 NOTE — Progress Notes (Signed)
MRN: [id number] Name: Terry Lyons  Sex: male Age: 68 y.o. DOB: [birth year]  Provider: Trudie Buckler P  Allergies: Bupropion hcl; Codeine; Escitalopram oxalate; Sertraline hcl; and Sulfur   Chief Complaint  Patient presents with  . Ingrown Toenail    1st toe left - lateral corner "I have had this for 3 days. Its getting worse. I've been soaking it and it s not any better"     HPI: Patient is 68 y.o. male who presents today for an ingrown toenail on the left hallux nail lateral nail border. The patient states he normally sees Dr. Paulla Dolly for routine nail trimming of the bilateral great toenails and he failed to come in to be seen when he should have. He states the toenail became ingrown and it became red and swollen over the last 3 days. He's been soaking it with no relief in symptoms.  Past Medical History  Diagnosis Date  . GERD (gastroesophageal reflux disease)   . Gout   . History of basal cell carcinoma     W-S Derm  . History of squamous cell carcinoma   . Hyperlipidemia   . Tachycardia   . Hypertension   . Allergy   . Rosacea   . Diabetes mellitus        Medication List       This list is accurate as of: 02/14/13  5:56 PM.  Always use your most recent med list.               allopurinol 300 MG tablet  Commonly known as:  ZYLOPRIM  1 1/2 by mouth daily as directed LABS OVERDUE     amoxicillin 500 MG capsule  Commonly known as:  AMOXIL  Take 2 capsules (1,000 mg total) by mouth 2 (two) times daily.     azelastine 137 MCG/SPRAY nasal spray  Commonly known as:  ASTELIN  Place 2 sprays into both nostrils at bedtime.     CALCIUM 600 1500 MG Tabs  Generic drug:  Calcium Carbonate  Take by mouth daily.     cephALEXin 500 MG capsule  Commonly known as:  KEFLEX  Take 1 capsule (500 mg total) by mouth 3 (three) times daily.     cetirizine 10 MG tablet  Commonly known as:  ZYRTEC  Take 1 tablet (10 mg total) by mouth daily.     chlorthalidone 25 MG tablet   Commonly known as:  HYGROTON  Take 1 tablet (25 mg total) by mouth daily.     doxazosin 4 MG tablet  Commonly known as:  CARDURA  Take 1 tablet (4 mg total) by mouth at bedtime.     Fish Oil 1000 MG Caps  Take by mouth daily.     glucose blood test strip  Commonly known as:  ONE TOUCH ULTRA TEST  Check blood sugar  Once daily as directed DX: 277.7     meloxicam 7.5 MG tablet  Commonly known as:  MOBIC  Take 1 tablet (7.5 mg total) by mouth daily.     metFORMIN 500 MG tablet  Commonly known as:  GLUCOPHAGE  Take 1 tablet (500 mg total) by mouth daily with breakfast. 1 by mouth daily with breakfast, LABS DUE     metoprolol tartrate 25 MG tablet  Commonly known as:  LOPRESSOR  Take 1 tablet (25 mg total) by mouth as directed. 1/2 tablet two times a day     MULTIVITAMIN PO  Take by mouth daily.  omeprazole 20 MG capsule  Commonly known as:  PRILOSEC  Take 20 mg by mouth daily.     ONETOUCH DELICA LANCETS Misc  Use as directed     VIAGRA 100 MG tablet  Generic drug:  sildenafil  Take 100 mg by mouth daily as needed. 1/2-1 tablet daily         Past Surgical History  Procedure Laterality Date  . Tonsillectomy    . Pilonidal cyst excision    . Knee arthroscopy      R  . Colonoscopy with polypectomy      X3  . Esophagus dilated      X3  . Shoulder surgery      right shoulder  . Total knee arthroplasty      RIGHT  . Knee arthroscopy      bilaterally       Review of Systems  DATA OBTAINED: from patient intake form GENERAL: Feels well no fevers, no fatigue, no changes in appetite SKIN: No itching, no rashes, no open lesions, no wounds EYES: No eye pain,no redness, no discharge EARS: No earache,no ringing of ears, no recent change in hearing NOSE: No congestion, no drainage, no bleeding  MOUTH/THROAT: No mouth pain, No sore throat, No difficulty chewing or swallowing  RESPIRATORY: No cough, no wheezing, no SOB CARDIAC: No chest pain,no heart  palpitations,no new onset lower extremity edema  GI: No abdominal pain, No Nausea, no vomiting, no diarrhea, no heartburn or no reflux  GU: No dysuria, no increased frequency or urgency MUSCULOSKELETAL: No unrelieved bone/joint pain,  NEUROLOGIC: Awake, alert, appropriate to situation, No change in mental status. PSYCHIATRIC: No overt anxiety or sadness.No behavior issue.  AMBULATION:  Ambulates unassisted  Filed Vitals:   02/14/13 1311  BP: 159/84  Pulse: 71  Resp: 16    Physical Exam  GENERAL APPEARANCE: Alert, conversant. Appropriately groomed. No acute distress.  VASCULAR: Pedal pulses palpable at 1+ out of 4 DP and PT bilateral.  Capillary refill time is immediate to all digits,  Proximal to distal cooling it warm to warm.  NEUROLOGIC: sensation is decreased epicritically and protectively to 5.07 monofilament at 3/5 sites bilateral. Pain at the distal lateral tip of the left hallux nail is noted.  MUSCULOSKELETAL: acceptable muscle strength, tone and stability bilateral.  DERMATOLOGIC: skin color, texture, and turger are within normal limits. Left hallux has a redness and swelling about the distal lateral tip of the hallux nail. It appears the lateral nail border is ingrown into the skin and with a debridement and I&D pus is present at the distal tip of the left hallux nail in the lateral corner.  Assessment  Paronychia left hallux lateral nail border   Plan Anesthetized the toe with a lidocaine and Marcaine plain mixture without complication. A sterile prep was applied and the distal lateral portion of the hallux nail was excised without complication. Pus was noted to be present in the distal portion of the toe where the nail bed hit against it. It was cleansed out well with hydrogen peroxide and iodine solution. Antibiotic ointment and a dry sterile compressive dressing was applied and the patient was instructed on aftercare including Epsom salts soaks. He'll be seen back in 4-6  weeks for routine care and debridement of nails to prevent this from recurring in the future. I also wrote a prescription for an oral antibiotic to use as well.     Jenai Scaletta P, DPM   Left great toenail ingrown lateral tip

## 2013-02-14 NOTE — Telephone Encounter (Signed)
Pt wife called stating that pt's toe was extremely sore, red and swollen. States that he does have an appt with Dr Paulla Dolly on Monday (02-18-12) but didn't think he could wait until then. Called pt and offered him and appt today with Dr Valentina Lucks at 1:15. Pt agreed and appt was put in by [facility].

## 2013-02-17 ENCOUNTER — Ambulatory Visit: Payer: Self-pay | Admitting: Podiatry

## 2013-03-27 ENCOUNTER — Encounter: Payer: Self-pay | Admitting: Podiatrist

## 2013-03-27 ENCOUNTER — Ambulatory Visit (INDEPENDENT_AMBULATORY_CARE_PROVIDER_SITE_OTHER): Payer: Medicare Other | Admitting: Podiatrist

## 2013-03-27 VITALS — BP 121/68 | HR 68 | Resp 12

## 2013-03-27 DIAGNOSIS — M79609 Pain in unspecified limb: Secondary | ICD-10-CM

## 2013-03-27 DIAGNOSIS — L03039 Cellulitis of unspecified toe: Secondary | ICD-10-CM

## 2013-03-27 DIAGNOSIS — B351 Tinea unguium: Secondary | ICD-10-CM

## 2013-03-27 NOTE — Progress Notes (Signed)
''   TOENAILS TRIM.''  HPI:  Patient presents today for follow up of foot and nail care. Denies any new complaints today.  Objective:  Patients chart is reviewed.  Vascular status reveals pedal pulses noted at 2 out of 4 dp and pt bilateral .  Neurological sensation is Normal to [employer] monofilament bilateral.  Patients nails are thickened, discolored, distrophic, friable and brittle with yellow-brown discoloration. Patient subjectively relates they are painful with shoes and with ambulation of bilateral feet.  Assessment:  Symptomatic onychomycosis  Plan:  Discussed treatment options and alternatives.  The symptomatic toenails were debrided through manual an mechanical means without complication.  Return appointment recommended at routine intervals of 3 months    Trudie Buckler, DPM

## 2013-04-02 ENCOUNTER — Encounter: Payer: Self-pay | Admitting: Internal Medicine

## 2013-05-01 ENCOUNTER — Ambulatory Visit: Payer: Medicare Other | Admitting: Internal Medicine

## 2013-05-10 ENCOUNTER — Emergency Department (HOSPITAL_COMMUNITY)
Admission: EM | Admit: 2013-05-10 | Discharge: 2013-05-10 | Disposition: A | Discharge status: Home / Self Care | Payer: Medicare Other | Attending: Emergency Medicine | Admitting: Emergency Medicine

## 2013-05-10 ENCOUNTER — Encounter (HOSPITAL_COMMUNITY): Payer: Self-pay | Admitting: Emergency Medicine

## 2013-05-10 ENCOUNTER — Emergency Department (HOSPITAL_COMMUNITY): Payer: Medicare Other

## 2013-05-10 DIAGNOSIS — Z872 Personal history of diseases of the skin and subcutaneous tissue: Secondary | ICD-10-CM | POA: Insufficient documentation

## 2013-05-10 DIAGNOSIS — K219 Gastro-esophageal reflux disease without esophagitis: Secondary | ICD-10-CM | POA: Insufficient documentation

## 2013-05-10 DIAGNOSIS — Z7982 Long term (current) use of aspirin: Secondary | ICD-10-CM | POA: Insufficient documentation

## 2013-05-10 DIAGNOSIS — R51 Headache: Secondary | ICD-10-CM | POA: Insufficient documentation

## 2013-05-10 DIAGNOSIS — R519 Headache, unspecified: Secondary | ICD-10-CM

## 2013-05-10 DIAGNOSIS — Z79899 Other long term (current) drug therapy: Secondary | ICD-10-CM | POA: Insufficient documentation

## 2013-05-10 DIAGNOSIS — I10 Essential (primary) hypertension: Secondary | ICD-10-CM | POA: Insufficient documentation

## 2013-05-10 DIAGNOSIS — M109 Gout, unspecified: Secondary | ICD-10-CM | POA: Insufficient documentation

## 2013-05-10 DIAGNOSIS — Z85828 Personal history of other malignant neoplasm of skin: Secondary | ICD-10-CM | POA: Insufficient documentation

## 2013-05-10 DIAGNOSIS — IMO0002 Reserved for concepts with insufficient information to code with codable children: Secondary | ICD-10-CM | POA: Insufficient documentation

## 2013-05-10 DIAGNOSIS — E119 Type 2 diabetes mellitus without complications: Secondary | ICD-10-CM | POA: Insufficient documentation

## 2013-05-10 DIAGNOSIS — Z87891 Personal history of nicotine dependence: Secondary | ICD-10-CM | POA: Insufficient documentation

## 2013-05-10 LAB — COMPREHENSIVE METABOLIC PANEL
ALK PHOS: 75 U/L (ref 39–117)
ALT: 29 U/L (ref 0–53)
AST: 26 U/L (ref 0–37)
Albumin: 3.4 g/dL — ABNORMAL LOW (ref 3.5–5.2)
BILIRUBIN TOTAL: 0.3 mg/dL (ref 0.3–1.2)
BUN: 15 mg/dL (ref 6–23)
CHLORIDE: 93 meq/L — AB (ref 96–112)
CO2: 27 meq/L (ref 19–32)
CREATININE: 0.85 mg/dL (ref 0.50–1.35)
Calcium: 9.6 mg/dL (ref 8.4–10.5)
GFR calc Af Amer: >90 mL/min (ref >90)
GFR, EST NON AFRICAN AMERICAN: 86 mL/min — AB (ref >90)
Glucose, Bld: 158 mg/dL — ABNORMAL HIGH (ref 70–99)
POTASSIUM: 3.4 meq/L — AB (ref 3.7–5.3)
Sodium: 136 mEq/L — ABNORMAL LOW (ref 137–147)
Total Protein: 7.5 g/dL (ref 6.0–8.3)

## 2013-05-10 LAB — CBC WITH DIFFERENTIAL/PLATELET
Basophils Absolute: 0 10*3/uL (ref 0.0–0.1)
Basophils Relative: 0 % (ref 0–1)
Eosinophils Absolute: 0.1 10*3/uL (ref 0.0–0.7)
Eosinophils Relative: 1 % (ref 0–5)
HCT: 43.8 % (ref 39.0–52.0)
Hemoglobin: 15.6 g/dL (ref 13.0–17.0)
LYMPHS ABS: 2.5 10*3/uL (ref 0.7–4.0)
LYMPHS PCT: 27 % (ref 12–46)
MCH: 32.6 pg (ref 26.0–34.0)
MCHC: 35.6 g/dL (ref 30.0–36.0)
MCV: 91.6 fL (ref 78.0–100.0)
MONOS PCT: 10 % (ref 3–12)
Monocytes Absolute: 0.9 10*3/uL (ref 0.1–1.0)
NEUTROS ABS: 5.6 10*3/uL (ref 1.7–7.7)
NEUTROS PCT: 62 % (ref 43–77)
PLATELETS: 234 10*3/uL (ref 150–400)
RBC: 4.78 MIL/uL (ref 4.22–5.81)
RDW: 14.1 % (ref 11.5–15.5)
WBC: 9 10*3/uL (ref 4.0–10.5)

## 2013-05-10 LAB — SEDIMENTATION RATE: SED RATE: 19 mm/h — AB (ref 0–16)

## 2013-05-10 MED ORDER — METOCLOPRAMIDE HCL 5 MG/ML IJ SOLN
10.0000 mg | Freq: Once | INTRAMUSCULAR | Status: AC
  Administered 2013-05-10: 10 mg via INTRAVENOUS
  Filled 2013-05-10: qty 2

## 2013-05-10 MED ORDER — SODIUM CHLORIDE 0.9 % IV BOLUS (SEPSIS)
1000.0000 mL | Freq: Once | INTRAVENOUS | Status: AC
  Administered 2013-05-10: 1000 mL via INTRAVENOUS

## 2013-05-10 MED ORDER — DEXAMETHASONE SODIUM PHOSPHATE 10 MG/ML IJ SOLN
10.0000 mg | Freq: Once | INTRAMUSCULAR | Status: AC
  Administered 2013-05-10: 10 mg via INTRAVENOUS
  Filled 2013-05-10: qty 1

## 2013-05-10 MED ORDER — DIPHENHYDRAMINE HCL 50 MG/ML IJ SOLN
25.0000 mg | Freq: Once | INTRAMUSCULAR | Status: AC
  Administered 2013-05-10: 25 mg via INTRAVENOUS
  Filled 2013-05-10: qty 1

## 2013-05-10 MED ORDER — KETOROLAC TROMETHAMINE 30 MG/ML IJ SOLN
30.0000 mg | Freq: Once | INTRAMUSCULAR | Status: AC
  Administered 2013-05-10: 30 mg via INTRAVENOUS
  Filled 2013-05-10: qty 1

## 2013-05-10 MED ORDER — TRIAMCINOLONE ACETONIDE 55 MCG/ACT NA AERO: 2.0000 | INHALATION_SPRAY | Freq: Every day | NASAL | Status: DC

## 2013-05-10 MED ORDER — HYDROCODONE-ACETAMINOPHEN 5-325 MG PO TABS: 1.0000 | ORAL_TABLET | Freq: Four times a day (QID) | ORAL | Status: DC | PRN

## 2013-05-10 NOTE — ED Provider Notes (Signed)
CSN: [id number]     Arrival date & time 05/10/13  1010 History   First MD Initiated Contact with Patient 05/10/13 1107     Chief Complaint  Patient presents with  . Headache     (Consider location/radiation/quality/duration/timing/severity/associated sxs/prior Treatment) HPI  Patient presents with concerns over headache for one week. Patient has had headaches in the past, typically with similar features, though rarely as prolonged. Approximately one week ago he gradually developed headaches, but has been present since that time. Headache is left-sided, retro-orbital, temporal, sore, severe, minimally improved with Tylenol or hydrocodone.  There is no associated visual loss, visual pain, neck pain, fever, chills, vomiting. Notably, the patient is currently being treated for sinusitis, recently started new antibiotic regimen.  Patient spoke with his ENT physician today and was referred here for evaluation.     Past Medical History  Diagnosis Date  . GERD (gastroesophageal reflux disease)   . Gout   . History of basal cell carcinoma     W-S Derm  . History of squamous cell carcinoma   . Hyperlipidemia   . Tachycardia   . Hypertension   . Allergy   . Rosacea   . Diabetes mellitus    Past Surgical History  Procedure Laterality Date  . Tonsillectomy    . Pilonidal cyst excision    . Knee arthroscopy      R  . Colonoscopy with polypectomy      X3  . Esophagus dilated      X3  . Shoulder surgery      right shoulder  . Total knee arthroplasty      RIGHT  . Knee arthroscopy      bilaterally   Family History  Problem Relation Age of Onset  . Lung cancer Mother     smoker  . Heart attack Brother 35    CBAG  . Lung cancer Maternal Aunt     smoker  . Heart attack Paternal Grandmother 59  . Heart disease Father     Rheumatic Heart Disease  . Heart attack Paternal Grandfather 27  . Diabetes Neg Hx   . Stroke Neg Hx    History  Substance Use Topics  . Smoking  status: Former Smoker    Types: Cigarettes    Quit date: 10/17/1973  . Smokeless tobacco: Never Used  . Alcohol Use: No    Review of Systems  Constitutional:       Per HPI, otherwise negative  HENT:       Chronic neck pain  Eyes: Negative for pain and visual disturbance.  Respiratory:       Per HPI, otherwise negative  Cardiovascular:       Per HPI, otherwise negative  Gastrointestinal: Negative for vomiting.  Endocrine:       Negative aside from HPI  Genitourinary:       Neg aside from HPI   Musculoskeletal:       Per HPI, otherwise negative  Skin: Negative.   Allergic/Immunologic: Positive for environmental allergies.  Neurological: Negative for syncope.      Allergies  Bupropion hcl; Codeine; Escitalopram oxalate; Sertraline hcl; and Sulfur  Home Medications   Current Outpatient Rx  Name  Route  Sig  Dispense  Refill  . allopurinol (ZYLOPRIM) 300 MG tablet   Oral   Take 150-300 mg by mouth 2 (two) times daily. 1 tablet (300 mg) in the morning and 0.5 tablets (150 mg) at night         .  aspirin EC 81 MG tablet   Oral   Take 81 mg by mouth daily.         . Calcium Carbonate (CALCIUM 600) 1500 MG TABS   Oral   Take 1,500 mg by mouth daily.          . cefUROXime (CEFTIN) 500 MG tablet   Oral   Take 500 mg by mouth 2 (two) times daily with a meal.         . cetirizine (ZYRTEC) 10 MG tablet   Oral   Take 1 tablet (10 mg total) by mouth daily.   90 tablet   3   . chlorthalidone (HYGROTON) 25 MG tablet   Oral   Take 1 tablet (25 mg total) by mouth daily.   90 tablet   0   . Cholecalciferol (VITAMIN D PO)   Oral   Take 1 tablet by mouth daily.         Marland Kitchen doxazosin (CARDURA) 4 MG tablet   Oral   Take 1 tablet (4 mg total) by mouth at bedtime.   90 tablet   1   . HYDROcodone-acetaminophen (NORCO/VICODIN) 5-325 MG per tablet   Oral   Take 1-2 tablets by mouth every 6 (six) hours as needed (headache).         . lactobacillus acidophilus  & bulgar (LACTINEX) chewable tablet   Oral   Chew 1 tablet by mouth 2 (two) times daily with a meal.         . meloxicam (MOBIC) 7.5 MG tablet   Oral   Take 1 tablet (7.5 mg total) by mouth daily.   90 tablet   2   . metFORMIN (GLUCOPHAGE) 500 MG tablet   Oral   Take 1 tablet (500 mg total) by mouth daily with breakfast. 1 by mouth daily with breakfast, LABS DUE   30 tablet   0     Due for labs.   . metoprolol tartrate (LOPRESSOR) 25 MG tablet   Oral   Take 12.5 mg by mouth 2 (two) times daily.         . Multiple Vitamin (MULTIVITAMIN PO)   Oral   Take by mouth daily.           . mupirocin cream (BACTROBAN) 2 %   Topical   Apply 1 application topically daily.         . Omega-3 Fatty Acids (FISH OIL) 1000 MG CAPS   Oral   Take by mouth daily.           Marland Kitchen omeprazole (PRILOSEC) 20 MG capsule   Oral   Take 20 mg by mouth daily.           . pseudoephedrine-acetaminophen (TYLENOL SINUS) 30-500 MG TABS   Oral   Take 2 tablets by mouth every 4 (four) hours as needed (headache).         . pseudoephedrine-guaifenesin (MUCINEX D) 60-600 MG per tablet   Oral   Take 1 tablet by mouth every 12 (twelve) hours.         . triamcinolone (NASACORT AQ) 55 MCG/ACT AERO nasal inhaler   Nasal   Place 2 sprays into the nose daily.   1 Inhaler   0    BP 133/88  Pulse 85  Temp(Src) 98 F (36.7 C) (Oral)  Resp 17  Ht 5\' 9"  (1.753 m)  Wt 307 lb 3.2 oz (139.345 kg)  BMI 45.34 kg/m2  SpO2 93% Physical Exam  Nursing note and vitals reviewed. Constitutional: He is oriented to person, place, and time. He appears well-developed. No distress.  HENT:  Head: Normocephalic and atraumatic.  Left Ear: Tympanic membrane normal.  Eyes: Conjunctivae and EOM are normal.  Cardiovascular: Normal rate and regular rhythm.   Pulmonary/Chest: Effort normal. No stridor. No respiratory distress.  Abdominal: He exhibits no distension.  Musculoskeletal: He exhibits no edema.   Neurological: He is alert and oriented to person, place, and time. He displays no atrophy and no tremor. No cranial nerve deficit or sensory deficit. He exhibits normal muscle tone. He displays no seizure activity. Coordination normal.  Speech is clear, no facial asymmetry the patient answers all questions appropriately.  Skin: Skin is warm and dry.  Psychiatric: He has a normal mood and affect.    ED Course  Procedures (including critical care time) Labs Review Labs Reviewed  COMPREHENSIVE METABOLIC PANEL - Abnormal; Notable for the following:    Sodium 136 (*)    Potassium 3.4 (*)    Chloride 93 (*)    Glucose, Bld 158 (*)    Albumin 3.4 (*)    GFR calc non Af Amer 86 (*)    All other components within normal limits  SEDIMENTATION RATE - Abnormal; Notable for the following:    Sed Rate 19 (*)    All other components within normal limits  CBC WITH DIFFERENTIAL   Imaging Review Ct Head Wo Contrast  05/10/2013   CLINICAL DATA:  68 year old male with headache x1 week. Pain behind the left eye with photosensitivity. Temporal pain on palpation. Initial encounter.  EXAM: CT HEAD WITHOUT CONTRAST  TECHNIQUE: Contiguous axial images were obtained from the base of the skull through the vertex without intravenous contrast.  COMPARISON:  Head CT without contrast 05/15/2011.  FINDINGS: Chronic, increased paranasal sinus mucosal thickening. Sphenoid and posterior ethmoid changes predominate. Most other Visualized paranasal sinuses and mastoids are clear. Stable visualized osseous structures. Stable orbit and scalp soft tissues.  Calcified atherosclerosis at the skull base. Stable cerebral volume. No ventriculomegaly. No midline shift, mass effect, or evidence of intracranial mass lesion. No suspicious intracranial vascular hyperdensity. Mild degree of vertebrobasilar dolichoectasia. No evidence of cortically based acute infarction identified. Stable gray-white matter differentiation throughout the  brain. No acute intracranial hemorrhage identified.  IMPRESSION: 1. Stable largely unremarkable for age non contrast CT appearance the brain. 2. Interval increased chronic paranasal sinus inflammatory changes.   Electronically Signed   By: Lars Pinks M.D.   On: 05/10/2013 13:08    2:12 PM HA has resolved entirely. The patient, his wife and his daughter and I discussed all results at length.  Specifically we discussed the CT results. Absent fever, distress, and with resolution of headaches, there is low suspicion for osteomyelitis or invasive infection. Patient states that he has no immunocompromised state. With his CT suggesting inflammatory changes she was started on inhaled steroids, and his antibiotics. Patient already has ENT followup scheduled in 4 days. In addition, patient will followup with primary care, and neurology as needed.   MDM   Final diagnoses:  Headache    Patient presents with concerns of ongoing headache.  On exam he is awake, alert, afebrile and hemodynamically stable, neurologically intact.  With the patient's history of headaches, there's lower suspicion for new intracranial pathology, the patient's family requests CT scan. CT scan demonstrates inflammatory changes in the sinuses, but no mass. Patient's headache resolved entirely here. With inflammatory changes, patient will receive steroid to complement  current antibiotic therapy, though patient requires close outpatient monitoring as well. Patient has this in place, was discharged in stable condition to follow up with primary care and ENT. Return precautions understood.    Carmin Muskrat, MD 05/10/13 [phone]

## 2013-05-10 NOTE — ED Notes (Signed)
Dr.Lockwood at bedside  

## 2013-05-10 NOTE — ED Notes (Signed)
Pt presents with headache x 1 week, took Tylenol Sinus yesterday and Hycodone x2 days ago, currently taking 1 antibiotic and Mucinex daily.  Pt states the headache persist behind the Left eye, photosensitivity, unsure of visual changes.  Pt states he does chronic neck pain x 1 year.  On palpation pt has left temporal pain.  Pt has a visible cyst in the left ear, pt had a flare up when the headache became most intense.  Cyst is intact, no drainage.

## 2013-05-10 NOTE — ED Notes (Signed)
Pt has had a headache for 1 week. He went to ENT on Wednesday and was started on ceftin and mucinex which he has been taking with no relief. He also tried tylenol sinus and hydrocodone with no relief. States headache persists

## 2013-05-10 NOTE — Discharge Instructions (Signed)
As discussed, your evaluation today has been largely reassuring.  But, it is important that you monitor your condition carefully, and do not hesitate to return to the ED if you develop new, or concerning changes in your condition.   Otherwise, please follow-up with your physician for appropriate ongoing care.   Please take all medication as directed.

## 2013-05-14 ENCOUNTER — Telehealth: Payer: Self-pay | Admitting: Internal Medicine

## 2013-05-14 NOTE — Telephone Encounter (Signed)
Pt went to the ER Sat.  For headaches.  He has an appt Monday.  He saw an ENT today.  Could he be worked in sooner than Monday.

## 2013-05-14 NOTE — Telephone Encounter (Signed)
I am sorry but schedule is overloaded due to holiday Fri

## 2013-05-15 ENCOUNTER — Ambulatory Visit: Payer: Medicare Other | Admitting: Internal Medicine

## 2013-05-19 ENCOUNTER — Ambulatory Visit: Payer: Medicare Other | Admitting: Internal Medicine

## 2013-05-21 ENCOUNTER — Encounter: Payer: Self-pay | Admitting: Internal Medicine

## 2013-05-21 ENCOUNTER — Ambulatory Visit (INDEPENDENT_AMBULATORY_CARE_PROVIDER_SITE_OTHER): Payer: Medicare Other | Admitting: Internal Medicine

## 2013-05-21 ENCOUNTER — Other Ambulatory Visit (INDEPENDENT_AMBULATORY_CARE_PROVIDER_SITE_OTHER): Payer: Medicare Other

## 2013-05-21 VITALS — BP 128/78 | HR 85 | Temp 98.6°F | Resp 14 | Wt 304.6 lb

## 2013-05-21 DIAGNOSIS — E876 Hypokalemia: Secondary | ICD-10-CM

## 2013-05-21 DIAGNOSIS — R946 Abnormal results of thyroid function studies: Secondary | ICD-10-CM

## 2013-05-21 DIAGNOSIS — E119 Type 2 diabetes mellitus without complications: Secondary | ICD-10-CM

## 2013-05-21 DIAGNOSIS — I10 Essential (primary) hypertension: Secondary | ICD-10-CM

## 2013-05-21 DIAGNOSIS — J323 Chronic sphenoidal sinusitis: Secondary | ICD-10-CM | POA: Insufficient documentation

## 2013-05-21 LAB — BASIC METABOLIC PANEL
BUN: 15 mg/dL (ref 6–23)
CHLORIDE: 97 meq/L (ref 96–112)
CO2: 27 meq/L (ref 19–32)
Calcium: 9.1 mg/dL (ref 8.4–10.5)
Creatinine, Ser: 1 mg/dL (ref 0.4–1.5)
GFR: 82.28 mL/min (ref >60.00)
GLUCOSE: 166 mg/dL — AB (ref 70–99)
POTASSIUM: 3.2 meq/L — AB (ref 3.5–5.1)
SODIUM: 136 meq/L (ref 135–145)

## 2013-05-21 LAB — TSH: TSH: 2.12 u[IU]/mL (ref 0.35–5.50)

## 2013-05-21 LAB — T4, FREE: FREE T4: 1.03 ng/dL (ref 0.60–1.60)

## 2013-05-21 LAB — HEMOGLOBIN A1C: Hgb A1c MFr Bld: 7.1 % — ABNORMAL HIGH (ref 4.6–6.5)

## 2013-05-21 LAB — T3, FREE: T3 FREE: 3.2 pg/mL (ref 2.3–4.2)

## 2013-05-21 NOTE — Progress Notes (Signed)
Pre visit review using our clinic review tool, if applicable. No additional management support is needed unless otherwise documented below in the visit note. 

## 2013-05-21 NOTE — Patient Instructions (Signed)

## 2013-05-21 NOTE — Progress Notes (Signed)
Subjective:    Patient ID: Terry Lyons, male    DOB: 07-29-1945, 68 y.o.   MRN: [id number]  HPI He was seen in emergency room 05/10/13 with left retro-orbital and left retroauricular headaches. Headache is described as dull to sharp but constant. It used Tylenol Sinus with some decrease in intensity. There was no history of head trauma or migraines. The headache was associated with sensitivity to light. He was producing green nasal secretions and had chills on one occasion. CT suggested chronic  sphenoid sinusitis. Potassium was 3.4; GFR was mildly reduced at 86. Nonfasting glucose was 158. CBC and differential was normal. Possible temporal arteritis was considered but his sedimentation rate was 19. He was given a migraine cocktail and discharged on cefuroxime & Nasacort AQ. As of 4/5 the headache has resolved Dr. Ernesto Rutherford plans endoscopic surgery to treat the chronic sphenoid sinusitis. Significant history includes dysrhythmias following R TKR.  He was evaluated for sleep apnea previously but this was negative by history   Review of Systems  He specifically denied blurred vision, double vision, loss of vision. Headache was not associated with excess tearing of the eyes. There was no phonophobia. He also denied tinnitus or sudden hearing loss associated with the headache There was no associated balance issues or vertigo. He also had no weakness, numbness, or tingling in extremities. There was no rash or other lesions in the areas of pain. There was no change in temperature or color of the skin in the area discomfort.  His fasting blood sugars range from 98-159. His last A1c was 6.6% in April 2014. He states that his thyroid function tests were normal recently at the [place]. He does not have specific values.     Objective:   Physical Exam General appearance:morbidly obese; no acute distress or increased work of breathing is present.  No  lymphadenopathy about the head, neck, or axilla noted.    Eyes: No conjunctival inflammation or lid edema is present. There is no scleral icterus.  Ears:  External ear exam shows no significant lesions or deformities.  Otoscopic examination reveals clear canals, tympanic membranes are intact bilaterally without bulging, retraction, inflammation or discharge.  Nose:  External nasal examination shows no deformity or inflammation. Nasal mucosa are pink and moist without lesions or exudates. No septal dislocation or deviation.No obstruction to airflow.   Oral exam: Dental hygiene is good; lips and gums are healthy appearing.There is no oropharyngeal erythema or exudate noted.   Neck:  No deformities, thyromegaly, masses, or tenderness noted.   Supple with full range of motion without pain.   Heart:  Normal rate and regular rhythm. S1 and S2 normal without gallop, murmur, click, rub or other extra sounds.   Lungs:Chest clear to auscultation; no wheezes, rhonchi,rales ,or rubs present.No increased work of breathing.    Extremities:  No cyanosis, edema, or clubbing  noted    Skin: Warm & dry w/o jaundice or tenting.                      Assessment & Plan:  #1 chronic sphenoid sinusitis; his headaches have resolved as of 4/5. He has had similar findings on CAT scans in 2013 with no episodes of such headache in that two-year period.His history suggests a neurovascular headache based on its location and presentation. He has bilateral sphenoid sinus disease but no symptoms on the right. #2 hypokalemia #3 DM  #4 history of abnormal thyroid function test  #5 history of  dysrhythmia following total knee replacement  #6 morbid obesity with increased risk perioperatively.  Plan: See orders

## 2013-05-22 ENCOUNTER — Other Ambulatory Visit: Payer: Self-pay

## 2013-05-22 ENCOUNTER — Other Ambulatory Visit: Payer: Self-pay | Admitting: Internal Medicine

## 2013-05-22 DIAGNOSIS — E1165 Type 2 diabetes mellitus with hyperglycemia: Principal | ICD-10-CM

## 2013-05-22 DIAGNOSIS — IMO0001 Reserved for inherently not codable concepts without codable children: Secondary | ICD-10-CM

## 2013-05-22 DIAGNOSIS — E876 Hypokalemia: Secondary | ICD-10-CM

## 2013-05-22 MED ORDER — SPIRONOLACTONE 25 MG PO TABS: 25.0000 mg | ORAL_TABLET | Freq: Every day | ORAL | Status: DC

## 2013-05-27 ENCOUNTER — Other Ambulatory Visit: Payer: Self-pay | Admitting: Otolaryngology

## 2013-05-27 HISTORY — PX: OTHER SURGICAL HISTORY: SHX169

## 2013-06-01 ENCOUNTER — Encounter: Payer: Self-pay | Admitting: Internal Medicine

## 2013-06-05 ENCOUNTER — Ambulatory Visit (INDEPENDENT_AMBULATORY_CARE_PROVIDER_SITE_OTHER): Payer: Medicare Other | Admitting: Podiatrist

## 2013-06-05 VITALS — BP 134/88 | HR 77 | Resp 18 | Ht 69.0 in | Wt 305.0 lb

## 2013-06-05 DIAGNOSIS — M79609 Pain in unspecified limb: Secondary | ICD-10-CM

## 2013-06-05 DIAGNOSIS — B351 Tinea unguium: Secondary | ICD-10-CM

## 2013-06-05 NOTE — Progress Notes (Signed)
HPI: Patient presents today for follow up of diabetic foot and nail care. Past medical history, meds, and allergies reviewed. Patient states blood sugar is under good  control.   Objective:   Objective:  Patients chart is reviewed.  Vascular status reveals pedal pulses noted at  2 out of 4 dp and pt bilateral .  Neurological sensation is Normal to [employer] monofilament bilateral at 5/5 sites bilateral.  Dermatological exam reveals  absence of pre ulcerative/ hyperkeratotic lesions.   Toenails are elongated, incurvated, discolored, dystrophic with ingrown deformity present.    Assessment: Diabetes  Ingrown nail deformity,  Plan: Discussed treatment options and alternatives. Debrided nails without complication. Debrided hyperkeratotic lesions without complication.  Return appointment recommended at routine intervals of 3 months.   Trudie Buckler, DPM

## 2013-06-08 ENCOUNTER — Encounter: Payer: Self-pay | Admitting: Internal Medicine

## 2013-08-05 ENCOUNTER — Encounter: Payer: Self-pay | Admitting: Internal Medicine

## 2013-08-12 ENCOUNTER — Telehealth: Payer: Self-pay | Admitting: *Deleted

## 2013-08-12 NOTE — Telephone Encounter (Signed)
Patient is due a lipid panel.  Spoke with patient and he goes to the [place] for his lab work.  I asked the patient to come by the office for a lipid panel but patient declined.

## 2013-08-15 ENCOUNTER — Encounter: Payer: Self-pay | Admitting: Internal Medicine

## 2013-08-25 ENCOUNTER — Telehealth: Payer: Self-pay | Admitting: *Deleted

## 2013-08-25 NOTE — Telephone Encounter (Signed)
We spoke to the on-call doctor this weekend.  My husband has an infected ingrown toenail.  The doctor told us to schedule an appointment for today.  I think we're going to wait until Wednesday to see Dr. Valentina Lucks because I think she's going to have to cut that toe open to get that nail out.  Is there anything we need to be doing in the meantime until he comes in?  He put him on an antibiotic, Cephelexin.  I informed her to continue to do the soaks.  She stated they have been doing Epsom salt soaks since Friday.  I told her he can do Epsom Salt or Betadine.  She asked if the Cephelexin will get the infection out.  I told her yes it should.  She stated they were worried because he's Diabetic.  I advised her to have him continue soaks and dressings until his visit on Wednesday.  She stated he will.

## 2013-08-27 ENCOUNTER — Encounter: Payer: Self-pay | Admitting: Podiatrist

## 2013-08-27 ENCOUNTER — Ambulatory Visit (INDEPENDENT_AMBULATORY_CARE_PROVIDER_SITE_OTHER): Payer: Medicare Other | Admitting: Podiatrist

## 2013-08-27 VITALS — BP 159/78 | HR 74 | Resp 12

## 2013-08-27 DIAGNOSIS — L03039 Cellulitis of unspecified toe: Secondary | ICD-10-CM

## 2013-08-27 NOTE — Patient Instructions (Signed)
Soak Instructions    THE DAY AFTER THE PROCEDURE  Place 1/4 cup of epsom salts in a quart of warm tap water.  Submerge your foot or feet with outer bandage intact for the initial soak; this will allow the bandage to become moist and wet for easy lift off.  Once you remove your bandage, continue to soak in the solution for 20 minutes.  This soak should be done twice a day.  Next, remove your foot or feet from solution, blot dry the affected area and cover.  You may use a band aid large enough to cover the area or use gauze and tape.  Apply other medications to the area as directed by the doctor such as polysporin neosporin.  IF YOUR SKIN BECOMES IRRITATED WHILE USING THESE INSTRUCTIONS, IT IS OKAY TO SWITCH TO  WHITE VINEGAR AND WATER. Or you may use antibacterial soap and water to keep the toe clean    

## 2013-08-27 NOTE — Progress Notes (Signed)
Left  Hallux nail i&d  Chief Complaint  Patient presents with  . Ingrown Toenail    ''LT FOOT GREAT TOENAIL IS GETTING A LITTLE BETTER BUT STILL SORE.''     HPI: Patient is 68 y.o. male who presents today for an infected ingrown toenail left first lateral border.  This toenail always stays incurvated and has become ingrown within the last 2 weeks.  He has been on antibiotics as well from his primary care physician.     Physical Exam Neurovascular status unchanged.  Left hallux  Nail is red, tender, and grown into the lateral side of the toenail.    Assessment: ingrown paronychia left halux nail.  Plan: because this nail always grow into the corner i recommended  A permanent procedure to remove the lateral side of the nail.  and patient agreed.  Left hallux was prepped with alcohol and a 1 to 1 mix of 0.5% marcaine plain and 2% lidocaine plain was administered in a digital block fashion.  The toe was then prepped with betadine solution and exsanguinated.  The offending nail border was then excised and matrix tissue exposed.  Phenol was then applied to the matrix tissue followed by an alcohol wash.  Antibiotic ointment and a dry sterile dressing was applied.  The patient was dispensed instructions for aftercare.  He will finish out his antibiotics and call if the toe gives him any further trouble.

## 2013-09-04 ENCOUNTER — Ambulatory Visit: Payer: Medicare Other | Admitting: Podiatrist

## 2013-09-19 ENCOUNTER — Encounter: Payer: Self-pay | Admitting: Internal Medicine

## 2013-10-06 ENCOUNTER — Other Ambulatory Visit: Payer: Self-pay | Admitting: Internal Medicine

## 2013-10-06 DIAGNOSIS — N644 Mastodynia: Secondary | ICD-10-CM

## 2013-10-08 ENCOUNTER — Ambulatory Visit
Admission: RE | Admit: 2013-10-08 | Discharge: 2013-10-08 | Disposition: A | Discharge status: Home / Self Care | Payer: Medicare Other | Source: Ambulatory Visit | Attending: Internal Medicine | Admitting: Internal Medicine

## 2013-10-08 DIAGNOSIS — N644 Mastodynia: Secondary | ICD-10-CM

## 2013-10-09 ENCOUNTER — Ambulatory Visit (AMBULATORY_SURGERY_CENTER): Payer: Self-pay

## 2013-10-09 VITALS — Ht 69.0 in | Wt 311.0 lb

## 2013-10-09 DIAGNOSIS — Z860100 Personal history of colon polyps, unspecified: Secondary | ICD-10-CM

## 2013-10-09 MED ORDER — MOVIPREP 100 G PO SOLR: 1.0000 | Freq: Once | ORAL | Status: DC

## 2013-10-09 NOTE — Progress Notes (Signed)
No allergies to eggs or soy No diet/weight loss meds No home oxygen No past problems with anesthesia  Has email  Emmi instructions given for colonoscopy 

## 2013-10-23 ENCOUNTER — Encounter: Payer: Self-pay | Admitting: Internal Medicine

## 2013-10-23 ENCOUNTER — Ambulatory Visit (AMBULATORY_SURGERY_CENTER): Payer: Medicare Other | Admitting: Internal Medicine

## 2013-10-23 VITALS — BP 132/78 | HR 72 | Temp 96.3°F | Resp 19 | Ht 69.0 in | Wt 311.0 lb

## 2013-10-23 DIAGNOSIS — D126 Benign neoplasm of colon, unspecified: Secondary | ICD-10-CM

## 2013-10-23 DIAGNOSIS — Z86010 Personal history of colonic polyps: Secondary | ICD-10-CM

## 2013-10-23 LAB — GLUCOSE, CAPILLARY
GLUCOSE-CAPILLARY: 103 mg/dL — AB (ref 70–99)
Glucose-Capillary: 110 mg/dL — ABNORMAL HIGH (ref 70–99)

## 2013-10-23 MED ORDER — SODIUM CHLORIDE 0.9 % IV SOLN: 500.0000 mL | INTRAVENOUS | Status: DC

## 2013-10-23 NOTE — Patient Instructions (Signed)
Colon polyp x 1 removed today, see handout given on polyps. Repeat colonoscopy in 5 years. Resume current medications. Call us with any questions or concerns. Thank you!!  YOU HAD AN ENDOSCOPIC PROCEDURE TODAY AT [facility]: Refer to the procedure report that was given to you for any specific questions about what was found during the examination.  If the procedure report does not answer your questions, please call your gastroenterologist to clarify.  If you requested that your care partner not be given the details of your procedure findings, then the procedure report has been included in a sealed envelope for you to review at your convenience later.  YOU SHOULD EXPECT: Some feelings of bloating in the abdomen. Passage of more gas than usual.  Walking can help get rid of the air that was put into your GI tract during the procedure and reduce the bloating. If you had a lower endoscopy (such as a colonoscopy or flexible sigmoidoscopy) you may notice spotting of blood in your stool or on the toilet paper. If you underwent a bowel prep for your procedure, then you may not have a normal bowel movement for a few days.  DIET: Your first meal following the procedure should be a light meal and then it is ok to progress to your normal diet.  A half-sandwich or bowl of soup is an example of a good first meal.  Heavy or fried foods are harder to digest and may make you feel nauseous or bloated.  Likewise meals heavy in dairy and vegetables can cause extra gas to form and this can also increase the bloating.  Drink plenty of fluids but you should avoid alcoholic beverages for 24 hours.  ACTIVITY: Your care partner should take you home directly after the procedure.  You should plan to take it easy, moving slowly for the rest of the day.  You can resume normal activity the day after the procedure however you should NOT DRIVE or use heavy machinery for 24 hours (because of the sedation medicines used  during the test).    SYMPTOMS TO REPORT IMMEDIATELY: A gastroenterologist can be reached at any hour.  During normal business hours, 8:30 AM to 5:00 PM Monday through Friday, call [phone].  After hours and on weekends, please call the GI answering service at [phone] who will take a message and have the physician on call contact you.   Following lower endoscopy (colonoscopy or flexible sigmoidoscopy):  Excessive amounts of blood in the stool  Significant tenderness or worsening of abdominal pains  Swelling of the abdomen that is new, acute  Fever of 100F or higher  Following upper endoscopy (EGD)  Vomiting of blood or coffee ground material  New chest pain or pain under the shoulder blades  Painful or persistently difficult swallowing  New shortness of breath  Fever of 100F or higher  Black, tarry-looking stools  FOLLOW UP: If any biopsies were taken you will be contacted by phone or by letter within the next 1-3 weeks.  Call your gastroenterologist if you have not heard about the biopsies in 3 weeks.  Our staff will call the home number listed on your records the next business day following your procedure to check on you and address any questions or concerns that you may have at that time regarding the information given to you following your procedure. This is a courtesy call and so if there is no answer at the home number and we have not  heard from you through the emergency physician on call, we will assume that you have returned to your regular daily activities without incident.  SIGNATURES/CONFIDENTIALITY: You and/or your care partner have signed paperwork which will be entered into your electronic medical record.  These signatures attest to the fact that that the information above on your After Visit Summary has been reviewed and is understood.  Full responsibility of the confidentiality of this discharge information lies with you and/or your care-partner.

## 2013-10-23 NOTE — Progress Notes (Signed)
Patient awakening,vss,report to rn 

## 2013-10-23 NOTE — Progress Notes (Signed)
Called to room to assist during endoscopic procedure.  Patient ID and intended procedure confirmed with present staff. Received instructions for my participation in the procedure from the performing physician.  

## 2013-10-23 NOTE — Op Note (Signed)
[place]  [address]. [place], [id number]   COLONOSCOPY PROCEDURE REPORT  PATIENT: Terry Lyons, Gerstner  MR#: [id number] BIRTHDATE: [birth year] , 55  yrs. old GENDER: Male ENDOSCOPIST: Eustace Quail, MD REFERRED [id number] Program Recall PROCEDURE DATE:  10/23/2013 PROCEDURE:   Colonoscopy with snare polypectomy x1 First Screening Colonoscopy - Avg.  risk and is 50 yrs.  old or older - No.  Prior Negative Screening - Now for repeat screening. N/A  History of Adenoma - Now for follow-up colonoscopy & has been > or = to 3 yrs.  Yes hx of adenoma.  Has been 3 or more years since last colonoscopy.  Polyps Removed Today? Yes. ASA CLASS:   Class II INDICATIONS:Patient's personal history of adenomatous colon polyps. Index exam 2009 with multiple adenomas; followup 2012 with 10 mm adenoma. MEDICATIONS: MAC sedation, administered by CRNA and propofol (Diprivan) 300mg  IV  DESCRIPTION OF PROCEDURE:   After the risks benefits and alternatives of the procedure were thoroughly explained, informed consent was obtained.  A digital rectal exam revealed no abnormalities of the rectum.   The LB [id number]  endoscope was introduced through the anus and advanced to the cecum, which was identified by both the appendix and ileocecal valve. No adverse events experienced.   The quality of the prep was excellent, using MoviPrep  The instrument was then slowly withdrawn as the colon was fully examined.  COLON FINDINGS: A diminutive polyp was found in the ascending colon. A polypectomy was performed with a cold snare.  The resection was complete and the polyp tissue was completely retrieved.   The colon mucosa was otherwise normal.  Retroflexed views revealed internal hemorrhoids. The time to cecum=2 minutes 26 seconds.  Withdrawal time=10 minutes 34 seconds.  The scope was withdrawn and the procedure completed. COMPLICATIONS: There were no complications.  ENDOSCOPIC  IMPRESSION: 1.   Diminutive polyp was found in the ascending colon; polypectomy was performed with a cold snare 2.   The colon mucosa was otherwise normal  RECOMMENDATIONS: 1. Follow up colonoscopy in 5 years   eSigned:  Eustace Quail, MD 10/23/2013 9:16 AM   cc: Crist Infante, MD and The Patient

## 2013-10-24 ENCOUNTER — Telehealth: Payer: Self-pay | Admitting: *Deleted

## 2013-10-24 NOTE — Telephone Encounter (Signed)
  Follow up Call-  Call back number 10/23/2013  Post procedure Call Back phone  # [phone]  Permission to leave phone message Yes     Patient questions:  Do you have a fever, pain , or abdominal swelling? No. Pain Score  0 *  Have you tolerated food without any problems? Yes.    Have you been able to return to your normal activities? Yes.    Do you have any questions about your discharge instructions: Diet   No. Medications  No. Follow up visit  No.  Do you have questions or concerns about your Care? No.  Actions: * If pain score is 4 or above: No action needed, pain <4.

## 2013-10-28 ENCOUNTER — Encounter: Payer: Self-pay | Admitting: Internal Medicine

## 2013-11-06 ENCOUNTER — Ambulatory Visit (INDEPENDENT_AMBULATORY_CARE_PROVIDER_SITE_OTHER): Payer: Medicare Other | Admitting: Podiatrist

## 2013-11-06 DIAGNOSIS — M79609 Pain in unspecified limb: Secondary | ICD-10-CM

## 2013-11-06 DIAGNOSIS — M79676 Pain in unspecified toe(s): Principal | ICD-10-CM

## 2013-11-06 DIAGNOSIS — B351 Tinea unguium: Secondary | ICD-10-CM

## 2013-11-06 NOTE — Patient Instructions (Signed)

## 2013-11-10 NOTE — Progress Notes (Signed)
HPI:  Patient presents today for follow up of foot and nail care. Denies any new complaints today.  Objective:  Patients chart is reviewed.  Vascular status reveals pedal pulses noted at 2 out of 4 dp and pt bilateral .  Neurological sensation is intact to Semmes Weinstein monofilament bilateral.  Patients nails are thickened, discolored, distrophic, friable and brittle with yellow-brown discoloration. Patient subjectively relates they are painful with shoes and with ambulation of bilateral feet.  Assessment:  Symptomatic onychomycosis  Plan:  Discussed treatment options and alternatives.  The symptomatic toenails were debrided through manual an mechanical means without complication.  Return appointment recommended at routine intervals of 3 months    Terek Bee, DPM  

## 2013-11-28 ENCOUNTER — Other Ambulatory Visit: Payer: Self-pay

## 2014-01-01 ENCOUNTER — Encounter: Payer: Self-pay | Admitting: Cardiology

## 2014-01-01 ENCOUNTER — Ambulatory Visit (INDEPENDENT_AMBULATORY_CARE_PROVIDER_SITE_OTHER): Payer: Medicare Other | Admitting: Cardiology

## 2014-01-01 VITALS — BP 124/72 | HR 79 | Ht 68.75 in | Wt 316.8 lb

## 2014-01-01 DIAGNOSIS — E8881 Metabolic syndrome: Secondary | ICD-10-CM

## 2014-01-01 DIAGNOSIS — E785 Hyperlipidemia, unspecified: Secondary | ICD-10-CM

## 2014-01-01 DIAGNOSIS — R0609 Other forms of dyspnea: Secondary | ICD-10-CM

## 2014-01-01 DIAGNOSIS — Z8249 Family history of ischemic heart disease and other diseases of the circulatory system: Secondary | ICD-10-CM

## 2014-01-01 DIAGNOSIS — I10 Essential (primary) hypertension: Secondary | ICD-10-CM

## 2014-01-01 NOTE — Progress Notes (Signed)
Patient ID: Terry Lyons, male   DOB: 04-May-1945, 68 y.o.   MRN: [id number]     Patient Name: Terry Lyons Date of Encounter: 01/01/2014  Primary Care Provider:  [name], MD Primary Cardiologist: Dorothy Spark   Problem List   Past Medical History  Diagnosis Date  . GERD (gastroesophageal reflux disease)   . Gout   . History of basal cell carcinoma     W-S Derm  . History of squamous cell carcinoma   . Hyperlipidemia   . Tachycardia   . Hypertension   . Allergy   . Rosacea   . Diabetes mellitus    Past Surgical History  Procedure Laterality Date  . Tonsillectomy    . Pilonidal cyst excision    . Knee arthroscopy      R  . Colonoscopy with polypectomy      X3  . Esophagus dilated      X3  . Shoulder surgery      right shoulder  . Total knee arthroplasty      RIGHT  . Knee arthroscopy      bilaterally  . Total ethmoidectomy & sphenoidectomiy Bilateral 05/27/13    also reduction of turbinates    Allergies  Allergies  Allergen Reactions  . Bupropion Hcl     REACTION: insomnia  . Codeine     REACTION: nausea  . Escitalopram Oxalate     REACTION: insomnia  . Sertraline Hcl     REACTION: insomnia  . Sulfur Other (See Comments)    HPI  68 year old male with metabolic syndrome that includes hyper triglyceridemia, obesity, type II non-insulin-dependent diabetes, physical inactivity who is coming with concern of worsening dyspnea on exertion. He states that he completely decrease his physical activity and right now he is walking minimally he attributes it that it started with knee replacement however, walking to shower would make him short of breath. He denies any chest pain, palpitations or syncope. He has chronic lower extremity edema that is nonpitting and has been there for 3-4 years. On he had a nuclear stress test in the past that was negative. It has been over 7 years doesn't remember exactly when. He also had an echocardiogram in 2010 that was  nonconclusive with possible inferior wall hypokinesis. The patient has a very significant family history of coronary artery disease. His brother died of myocardial infarction 99, 2 of his grandmothers both died in their 13s of myocardial infarction. His father died of complications of rheumatic heart disease at age of 24. He knows his high triglycerides and was prescribed Lipitor but is reluctant because he is worrying about side effects with worsening diabetes.  Home Medications  Prior to Admission medications   Medication Sig Start Date End Date Taking? Authorizing Provider  allopurinol (ZYLOPRIM) 300 MG tablet Take 150-300 mg by mouth 2 (two) times daily. 1 tablet (300 mg) in the morning and 0.5 tablets (150 mg) at night   Yes Historical Provider, MD  aspirin EC 81 MG tablet Take 81 mg by mouth daily.   Yes Historical Provider, MD  Calcium Carbonate (CALCIUM 600) 1500 MG TABS Take 1,500 mg by mouth daily.    Yes Historical Provider, MD  Cholecalciferol (VITAMIN D PO) Take 1 tablet by mouth daily.   Yes Historical Provider, MD  Coenzyme Q10 (CO Q 10 PO) Take 1 capsule by mouth daily.    Yes Historical Provider, MD  diphenhydramine-acetaminophen (TYLENOL PM) 25-500 MG TABS Take 1 tablet by  mouth at bedtime as needed (sleep).    Yes Historical Provider, MD  doxazosin (CARDURA) 4 MG tablet Take 1 tablet (4 mg total) by mouth at bedtime. 08/24/11  Yes Hendricks Limes, MD  meloxicam (MOBIC) 7.5 MG tablet Take 1 tablet (7.5 mg total) by mouth daily. 05/27/12  Yes Hendricks Limes, MD  metFORMIN (GLUCOPHAGE) 500 MG tablet Take 1 tablet (500 mg total) by mouth daily with breakfast. 1 by mouth daily with breakfast, LABS DUE Patient taking differently: Take 500 mg by mouth daily with breakfast. LABS DUE 05/27/12  Yes Hendricks Limes, MD  metoprolol tartrate (LOPRESSOR) 25 MG tablet Take 12.5 mg by mouth 2 (two) times daily.   Yes Historical Provider, MD  Multiple Vitamin (MULTIVITAMIN PO) Take 1 capsule by  mouth daily.    Yes Historical Provider, MD  mupirocin cream (BACTROBAN) 2 % Apply 1 application topically daily.   Yes Historical Provider, MD  Omega-3 Fatty Acids (FISH OIL) 1000 MG CAPS Take 1 capsule by mouth daily.    Yes Historical Provider, MD  omeprazole (PRILOSEC) 20 MG capsule Take 20-40 mg by mouth daily.    Yes Historical Provider, MD  pseudoephedrine-acetaminophen (TYLENOL SINUS) 30-500 MG TABS Take 2 tablets by mouth every 4 (four) hours as needed (headache).   Yes Historical Provider, MD  pseudoephedrine-guaifenesin (MUCINEX D) 60-600 MG per tablet Take 1 tablet by mouth daily.    Yes Historical Provider, MD  spironolactone (ALDACTONE) 25 MG tablet Take 1 tablet (25 mg total) by mouth daily. 05/22/13  Yes Hendricks Limes, MD    Family History  Family History  Problem Relation Age of Onset  . Lung cancer Mother     smoker  . Heart attack Brother 31    CBAG  . Lung cancer Maternal Aunt     smoker  . Heart attack Paternal Grandmother 47  . Heart disease Father     Rheumatic Heart Disease  . Heart attack Paternal Grandfather 2  . Diabetes Neg Hx   . Stroke Neg Hx   . Colon cancer Neg Hx   . Pancreatic cancer Neg Hx   . Rectal cancer Neg Hx   . Stomach cancer Neg Hx     Social History  History   Social History  . Marital Status: Married    Spouse Name: N/A    Number of Children: N/A  . Years of Education: N/A   Occupational History  . Not on file.   Social History Main Topics  . Smoking status: Former Smoker    Types: Cigarettes    Quit date: 10/17/1973  . Smokeless tobacco: Never Used  . Alcohol Use: No  . Drug Use: No  . Sexual Activity: Not on file   Other Topics Concern  . Not on file   Social History Narrative     Review of Systems, as per HPI, otherwise negative General:  No chills, fever, night sweats or weight changes.  Cardiovascular:  No chest pain, dyspnea on exertion, edema, orthopnea, palpitations, paroxysmal nocturnal  dyspnea. Dermatological: No rash, lesions/masses Respiratory: No cough, dyspnea Urologic: No hematuria, dysuria Abdominal:   No nausea, vomiting, diarrhea, bright red blood per rectum, melena, or hematemesis Neurologic:  No visual changes, wkns, changes in mental status. All other systems reviewed and are otherwise negative except as noted above.  Physical Exam  Blood pressure 124/72, pulse 79, height 5' 8.75" (1.746 m), weight 316 lb 12.8 oz (143.7 kg).  General: Pleasant, NAD Psych: Normal  affect. Neuro: Alert and oriented X 3. Moves all extremities spontaneously. HEENT: Normal  Neck: Supple without bruits or JVD. Lungs:  Resp regular and unlabored, CTA. Heart: RRR no s3, s4, or murmurs. Abdomen: Soft, non-tender, non-distended, BS + x 4.  Extremities: No clubbing, cyanosis, mild non-pitting edema. DP/PT/Radials 2+ and equal bilaterally.  Labs:  No results for input(s): CKTOTAL, CKMB, TROPONINI in the last 72 hours. Lab Results  Component Value Date   WBC 9.0 05/10/2013   HGB 15.6 05/10/2013   HCT 43.8 05/10/2013   MCV 91.6 05/10/2013   PLT 234 05/10/2013    Lab Results  Component Value Date   DDIMER * 06/09/2008    1.34        AT THE INHOUSE ESTABLISHED CUTOFF VALUE OF 0.48 ug/mL FEU, THIS ASSAY HAS BEEN DOCUMENTED IN THE LITERATURE TO HAVE A SENSITIVITY AND NEGATIVE PREDICTIVE VALUE OF AT LEAST 98 TO 99%.  THE TEST RESULT SHOULD BE CORRELATED WITH AN ASSESSMENT OF THE CLINICAL PROBABILITY OF DVT / VTE.   Invalid input(s): POCBNP    Component Value Date/Time   NA 136 05/21/2013 1216   K 3.2* 05/21/2013 1216   CL 97 05/21/2013 1216   CO2 27 05/21/2013 1216   GLUCOSE 166* 05/21/2013 1216   BUN 15 05/21/2013 1216   CREATININE 1.0 05/21/2013 1216   CALCIUM 9.1 05/21/2013 1216   PROT 7.5 05/10/2013 1135   ALBUMIN 3.4* 05/10/2013 1135   AST 26 05/10/2013 1135   ALT 29 05/10/2013 1135   ALKPHOS 75 05/10/2013 1135   BILITOT 0.3 05/10/2013 1135   GFRNONAA 86*  05/10/2013 1135   GFRAA >90 05/10/2013 1135   Lab Results  Component Value Date   CHOL 188 12/21/2010   HDL 45.30 12/21/2010   TRIG 252.0* 12/21/2010    Accessory Clinical Findings  Echocardiogram - 2010  SUMMARY - EF difficult to assess due to poor images. Possible inferobasal    hypokinesis Left ventricular wall thickness was mildly    increased. - The aortic valve was mildly calcified. - Posterior annular calcification  ECG - NSR, normal ECG    Assessment & Plan  42-year-old male  1. Dyspnea on exertion - the patient has all the risk factor including that B-D's, hypertension, untreated hyperlipidemia, obesity, physical inactivity, significant family history of premature coronary artery disease, prior history of smoking and he has also been exposed to agent orange. We will schedule Ubaldo Glassing can nuclear stress test. The patient will be able to walk.  2. Hypertension - controlled  3. Hyperlipidemia - patient is strongly advised to start taking Lipitor, he will consider it.  Follow-up after stress test.    Dorothy Spark, MD, [facility] 01/01/2014, 8:17 AM

## 2014-01-01 NOTE — Patient Instructions (Signed)
Your physician recommends that you continue on your current medications as directed. Please refer to the Current Medication list given to you today.   Your physician has requested that you have a lexiscan myoview. For further information please visit [email or web address]. Please follow instruction sheet, as given.    Your physician recommends that you schedule a follow-up appointment in: [place] TEST

## 2014-01-02 ENCOUNTER — Encounter: Payer: Self-pay | Admitting: Internal Medicine

## 2014-01-06 ENCOUNTER — Ambulatory Visit (HOSPITAL_COMMUNITY): Payer: Medicare Other | Attending: Internal Medicine | Admitting: Radiology

## 2014-01-06 DIAGNOSIS — R079 Chest pain, unspecified: Secondary | ICD-10-CM | POA: Diagnosis present

## 2014-01-06 DIAGNOSIS — Z8249 Family history of ischemic heart disease and other diseases of the circulatory system: Secondary | ICD-10-CM

## 2014-01-06 DIAGNOSIS — I10 Essential (primary) hypertension: Secondary | ICD-10-CM | POA: Diagnosis not present

## 2014-01-06 DIAGNOSIS — I2510 Atherosclerotic heart disease of native coronary artery without angina pectoris: Secondary | ICD-10-CM | POA: Insufficient documentation

## 2014-01-06 DIAGNOSIS — R0609 Other forms of dyspnea: Secondary | ICD-10-CM | POA: Insufficient documentation

## 2014-01-06 DIAGNOSIS — E785 Hyperlipidemia, unspecified: Secondary | ICD-10-CM

## 2014-01-06 DIAGNOSIS — E119 Type 2 diabetes mellitus without complications: Secondary | ICD-10-CM | POA: Insufficient documentation

## 2014-01-06 MED ORDER — TECHNETIUM TC 99M SESTAMIBI GENERIC - CARDIOLITE
33.0000 | Freq: Once | INTRAVENOUS | Status: AC | PRN
  Administered 2014-01-06: 33 via INTRAVENOUS

## 2014-01-06 MED ORDER — REGADENOSON 0.4 MG/5ML IV SOLN
0.4000 mg | Freq: Once | INTRAVENOUS | Status: AC
  Administered 2014-01-06: 0.4 mg via INTRAVENOUS

## 2014-01-06 NOTE — Progress Notes (Signed)
[facility] [place], [place] [address] [phone]    Cardiology Nuclear Med Study  Terry Lyons is a 68 y.o. male     MRN : [id number]     DOB: [birth year]  Procedure Date: 01/06/2014  Nuclear Med Background Indication for Stress Test:  Evaluation for Ischemia and Follow up CAD History:  CAD, '10 MPI: NL  Cardiac Risk Factors: Hypertension and NIDDM  Symptoms:  Chest Pain and DOE   Nuclear Pre-Procedure Caffeine/Decaff Intake:  7:30pm NPO After: 7:30pm   Lungs:  clear O2 Sat: 95% on room air. IV 0.9% NS with Angio Cath:  22g  IV Site: R Hand  IV Started by:  Matilde Haymaker, RN  Chest Size (in):  52 Cup Size: n/a  Height: 5\' 9"  (1.753 m)  Weight:  310 lb (140.615 kg)  BMI:  Body mass index is 45.76 kg/(m^2). Tech Comments:  No Lopressor x 13 hrs    Nuclear Med Study 1 or 2 day study: 2 day  Stress Test Type:  Lexiscan  Reading MD: n/a  Order Authorizing Provider:  Filiberto Pinks  Resting Radionuclide: Technetium 71m Sestamibi  Resting Radionuclide Dose: 33.0 mCi  On      01-07-14  Stress Radionuclide:  Technetium 59m Sestamibi  Stress Radionuclide Dose: 33.0 mCi   On        01-06-14          Stress Protocol Rest HR: 78 Stress HR: 78  Rest BP: 139/79 Stress BP: 144/72  Exercise Time (min): n/a METS: n/a   Predicted Max HR: 150 bpm % Max HR: 62 bpm Rate Pressure Product: 13392   Dose of Adenosine (mg):  n/a Dose of Lexiscan: 0.4 mg  Dose of Atropine (mg): n/a Dose of Dobutamine: n/a mcg/kg/min (at max HR)  Stress Test Technologist: Perrin Maltese, EMT-P  Nuclear Technologist:  Earl Many, CNMT     Rest Procedure:  Myocardial perfusion imaging was performed at rest 45 minutes following the intravenous administration of Technetium 56m Sestamibi. Rest ECG: NSR - Normal EKG  Stress Procedure:  The patient received IV Lexiscan 0.4 mg over 15-seconds.  Technetium 1m Sestamibi injected at 30-seconds. This patient  had sob and felt weird with the Lexiscan Quantitative spect images were obtained after a 45 minute delay. Stress ECG: No significant change from baseline ECG  QPS Raw Data Images:  Normal; no motion artifact; normal heart/lung ratio. Stress Images:  Small, mild apical lateral and apical perfusion defect. Rest Images:  Small, mild apical lateral and apical perfusion defect. Subtraction (SDS):  Small fixed apical lateral and apical perfusion defect. Transient Ischemic Dilatation (Normal <1.22):  0.96 Lung/Heart Ratio (Normal <0.45):  0.34  Quantitative Gated Spect Images QGS EDV:  92 ml QGS ESV:  39 ml  Impression Exercise Capacity:  Lexiscan with no exercise. BP Response:  Normal blood pressure response. Clinical Symptoms:  Dyspnea.  ECG Impression:  No significant ST segment change suggestive of ischemia. Comparison with Prior Nuclear Study: No images to compare  Overall Impression:  Low risk stress nuclear study with a fixed, small, mild apical lateral and apical perfusion defect.  Possible attenuation but cannot rule out small infarction.  No ischemia. .  LV Ejection Fraction: 58%.  LV Wall Motion:  NL LV Function; NL Wall Motion   [name] 01/07/2014

## 2014-01-07 ENCOUNTER — Ambulatory Visit (HOSPITAL_COMMUNITY): Payer: Medicare Other | Attending: Cardiology

## 2014-01-07 MED ORDER — TECHNETIUM TC 99M SESTAMIBI GENERIC - CARDIOLITE
30.0000 | Freq: Once | INTRAVENOUS | Status: AC | PRN
  Administered 2014-01-07: 30 via INTRAVENOUS

## 2014-01-09 ENCOUNTER — Telehealth: Payer: Self-pay

## 2014-01-09 NOTE — Telephone Encounter (Signed)
-----   Message from Dorothy Spark, MD sent at 01/09/2014  1:40 PM EST ----- Normal stress test

## 2014-01-09 NOTE — Telephone Encounter (Signed)
Left message that stress test was normal per Dr. Meda Coffee. Informed in message to call office if he had any questions.  Ewell Poe RN

## 2014-01-14 ENCOUNTER — Encounter: Payer: Self-pay | Admitting: Podiatrist

## 2014-01-14 ENCOUNTER — Ambulatory Visit (INDEPENDENT_AMBULATORY_CARE_PROVIDER_SITE_OTHER): Payer: Medicare Other | Admitting: Podiatrist

## 2014-01-14 DIAGNOSIS — B351 Tinea unguium: Secondary | ICD-10-CM

## 2014-01-14 DIAGNOSIS — M79676 Pain in unspecified toe(s): Secondary | ICD-10-CM

## 2014-01-14 NOTE — Patient Instructions (Signed)
Diabetes and Foot Care Diabetes may cause you to have problems because of poor blood supply (circulation) to your feet and legs. This may cause the skin on your feet to become thinner, break easier, and heal more slowly. Your skin may become dry, and the skin may peel and crack. You may also have nerve damage in your legs and feet causing decreased feeling in them. You may not notice minor injuries to your feet that could lead to infections or more serious problems. Taking care of your feet is one of the most important things you can do for yourself.  HOME CARE INSTRUCTIONS  Wear shoes at all times, even in the house. Do not go barefoot. Bare feet are easily injured.  Check your feet daily for blisters, cuts, and redness. If you cannot see the bottom of your feet, use a mirror or ask someone for help.  Wash your feet with warm water (do not use hot water) and mild soap. Then pat your feet and the areas between your toes until they are completely dry. Do not soak your feet as this can dry your skin.  Apply a moisturizing lotion or petroleum jelly (that does not contain alcohol and is unscented) to the skin on your feet and to dry, brittle toenails. Do not apply lotion between your toes.  Trim your toenails straight across. Do not dig under them or around the cuticle. File the edges of your nails with an emery board or nail file.  Do not cut corns or calluses or try to remove them with medicine.  Wear clean socks or stockings every day. Make sure they are not too tight. Do not wear knee-high stockings since they may decrease blood flow to your legs.  Wear shoes that fit properly and have enough cushioning. To break in new shoes, wear them for just a few hours a day. This prevents you from injuring your feet. Always look in your shoes before you put them on to be sure there are no objects inside.  Do not cross your legs. This may decrease the blood flow to your feet.  If you find a minor scrape,  cut, or break in the skin on your feet, keep it and the skin around it clean and dry. These areas may be cleansed with mild soap and water. Do not cleanse the area with peroxide, alcohol, or iodine.  When you remove an adhesive bandage, be sure not to damage the skin around it.  If you have a wound, look at it several times a day to make sure it is healing.  Do not use heating pads or hot water bottles. They may burn your skin. If you have lost feeling in your feet or legs, you may not know it is happening until it is too late.  Make sure your health care provider performs a complete foot exam at least annually or more often if you have foot problems. Report any cuts, sores, or bruises to your health care provider immediately. SEEK MEDICAL CARE IF:   You have an injury that is not healing.  You have cuts or breaks in the skin.  You have an ingrown nail.  You notice redness on your legs or feet.  You feel burning or tingling in your legs or feet.  You have pain or cramps in your legs and feet.  Your legs or feet are numb.  Your feet always feel cold. SEEK IMMEDIATE MEDICAL CARE IF:   There is increasing redness,   swelling, or pain in or around a wound.  There is a red line that goes up your leg.  Pus is coming from a wound.  You develop a fever or as directed by your health care provider.  You notice a bad smell coming from an ulcer or wound. Document Released: 01/28/2000 Document Revised: 10/02/2012 Document Reviewed: 07/09/2012 ExitCare Patient Information 2015 [employer]. This information is not intended to replace advice given to you by your health care provider. Make sure you discuss any questions you have with your health care provider.  

## 2014-01-18 NOTE — Progress Notes (Signed)
HPI:  Patient presents today for follow up of foot and nail care. Denies any new complaints today.  Objective:  Patients chart is reviewed.  Vascular status reveals pedal pulses noted at 2 out of 4 dp and pt bilateral .  Neurological sensation is intact to Semmes Weinstein monofilament bilateral.  Patients nails are thickened, discolored, distrophic, friable and brittle with yellow-brown discoloration. Patient subjectively relates they are painful with shoes and with ambulation of bilateral feet.  Assessment:  Symptomatic onychomycosis  Plan:  Discussed treatment options and alternatives.  The symptomatic toenails were debrided through manual an mechanical means without complication.  Return appointment recommended at routine intervals of 3 months    Cordaro Mukai, DPM  

## 2014-01-25 ENCOUNTER — Emergency Department (HOSPITAL_COMMUNITY)
Admission: EM | Admit: 2014-01-25 | Discharge: 2014-01-25 | Disposition: A | Discharge status: Home / Self Care | Payer: Medicare Other | Attending: Emergency Medicine | Admitting: Emergency Medicine

## 2014-01-25 ENCOUNTER — Encounter (HOSPITAL_COMMUNITY): Payer: Self-pay | Admitting: Emergency Medicine

## 2014-01-25 DIAGNOSIS — M109 Gout, unspecified: Secondary | ICD-10-CM | POA: Diagnosis not present

## 2014-01-25 DIAGNOSIS — Z85828 Personal history of other malignant neoplasm of skin: Secondary | ICD-10-CM | POA: Insufficient documentation

## 2014-01-25 DIAGNOSIS — K219 Gastro-esophageal reflux disease without esophagitis: Secondary | ICD-10-CM | POA: Diagnosis not present

## 2014-01-25 DIAGNOSIS — E119 Type 2 diabetes mellitus without complications: Secondary | ICD-10-CM | POA: Diagnosis not present

## 2014-01-25 DIAGNOSIS — I10 Essential (primary) hypertension: Secondary | ICD-10-CM | POA: Insufficient documentation

## 2014-01-25 DIAGNOSIS — R109 Unspecified abdominal pain: Secondary | ICD-10-CM | POA: Diagnosis present

## 2014-01-25 DIAGNOSIS — Z79899 Other long term (current) drug therapy: Secondary | ICD-10-CM | POA: Diagnosis not present

## 2014-01-25 DIAGNOSIS — E669 Obesity, unspecified: Secondary | ICD-10-CM | POA: Insufficient documentation

## 2014-01-25 DIAGNOSIS — R000 Tachycardia, unspecified: Secondary | ICD-10-CM | POA: Diagnosis not present

## 2014-01-25 DIAGNOSIS — N390 Urinary tract infection, site not specified: Secondary | ICD-10-CM | POA: Insufficient documentation

## 2014-01-25 DIAGNOSIS — Z791 Long term (current) use of non-steroidal anti-inflammatories (NSAID): Secondary | ICD-10-CM | POA: Insufficient documentation

## 2014-01-25 DIAGNOSIS — R319 Hematuria, unspecified: Secondary | ICD-10-CM | POA: Insufficient documentation

## 2014-01-25 DIAGNOSIS — Z7982 Long term (current) use of aspirin: Secondary | ICD-10-CM | POA: Insufficient documentation

## 2014-01-25 DIAGNOSIS — Z87891 Personal history of nicotine dependence: Secondary | ICD-10-CM | POA: Insufficient documentation

## 2014-01-25 HISTORY — DX: Obesity, unspecified: E66.9

## 2014-01-25 HISTORY — DX: Unspecified osteoarthritis, unspecified site: M19.90

## 2014-01-25 LAB — COMPREHENSIVE METABOLIC PANEL
ALT: 37 U/L (ref 0–53)
AST: 25 U/L (ref 0–37)
Albumin: 3.7 g/dL (ref 3.5–5.2)
Alkaline Phosphatase: 99 U/L (ref 39–117)
Anion gap: 12 (ref 5–15)
BILIRUBIN TOTAL: 0.3 mg/dL (ref 0.3–1.2)
BUN: 16 mg/dL (ref 6–23)
CHLORIDE: 101 meq/L (ref 96–112)
CO2: 26 meq/L (ref 19–32)
CREATININE: 1.06 mg/dL (ref 0.50–1.35)
Calcium: 9.5 mg/dL (ref 8.4–10.5)
GFR calc Af Amer: 80 mL/min — ABNORMAL LOW (ref >90)
GFR, EST NON AFRICAN AMERICAN: 69 mL/min — AB (ref >90)
Glucose, Bld: 137 mg/dL — ABNORMAL HIGH (ref 70–99)
Potassium: 5.1 mEq/L (ref 3.7–5.3)
Sodium: 139 mEq/L (ref 137–147)
Total Protein: 7.1 g/dL (ref 6.0–8.3)

## 2014-01-25 LAB — URINALYSIS, ROUTINE W REFLEX MICROSCOPIC
Bilirubin Urine: NEGATIVE
GLUCOSE, UA: NEGATIVE mg/dL
Ketones, ur: NEGATIVE mg/dL
Leukocytes, UA: NEGATIVE
Nitrite: NEGATIVE
PH: 5 (ref 5.0–8.0)
Protein, ur: NEGATIVE mg/dL
Specific Gravity, Urine: 1.021 (ref 1.005–1.030)
Urobilinogen, UA: 0.2 mg/dL (ref 0.0–1.0)

## 2014-01-25 LAB — CBC WITH DIFFERENTIAL/PLATELET
Basophils Absolute: 0.1 10*3/uL (ref 0.0–0.1)
Basophils Relative: 1 % (ref 0–1)
Eosinophils Absolute: 0.2 10*3/uL (ref 0.0–0.7)
Eosinophils Relative: 3 % (ref 0–5)
HEMATOCRIT: 42.9 % (ref 39.0–52.0)
HEMOGLOBIN: 14.2 g/dL (ref 13.0–17.0)
LYMPHS ABS: 2.3 10*3/uL (ref 0.7–4.0)
LYMPHS PCT: 38 % (ref 12–46)
MCH: 31.1 pg (ref 26.0–34.0)
MCHC: 33.1 g/dL (ref 30.0–36.0)
MCV: 93.9 fL (ref 78.0–100.0)
MONO ABS: 0.7 10*3/uL (ref 0.1–1.0)
MONOS PCT: 11 % (ref 3–12)
NEUTROS ABS: 2.9 10*3/uL (ref 1.7–7.7)
Neutrophils Relative %: 47 % (ref 43–77)
Platelets: 192 10*3/uL (ref 150–400)
RBC: 4.57 MIL/uL (ref 4.22–5.81)
RDW: 14.3 % (ref 11.5–15.5)
WBC: 6.1 10*3/uL (ref 4.0–10.5)

## 2014-01-25 LAB — URINE MICROSCOPIC-ADD ON

## 2014-01-25 MED ORDER — TRAMADOL HCL 50 MG PO TABS
50.0000 mg | ORAL_TABLET | Freq: Four times a day (QID) | ORAL | Status: DC | PRN
Start: 2014-01-25 — End: 2015-03-06

## 2014-01-25 MED ORDER — CEPHALEXIN 500 MG PO CAPS: 500.0000 mg | ORAL_CAPSULE | Freq: Four times a day (QID) | ORAL | Status: DC

## 2014-01-25 NOTE — ED Notes (Signed)
Pt. woke up this morning with with right flank pain " burning "  and urinary frequency , denies fever or chills.

## 2014-01-25 NOTE — Discharge Instructions (Signed)
There is blood in the urine (microscopic) - otherwise it looks normal. Please take the antibiotics, and see your primary doctor for further evaluation of the blood in the urine - as if it persists, they will have to look for the source. As mentioned, this could be stone or infection - if it is a stone, pain might get worse. If the pain is unbearable, you start having fevers, chills, nausea- return to the ER.   Hematuria Hematuria is blood in your urine. It can be caused by a bladder infection, kidney infection, prostate infection, kidney stone, or cancer of your urinary tract. Infections can usually be treated with medicine, and a kidney stone usually will pass through your urine. If neither of these is the cause of your hematuria, further workup to find out the reason may be needed. It is very important that you tell your health care provider about any blood you see in your urine, even if the blood stops without treatment or happens without causing pain. Blood in your urine that happens and then stops and then happens again can be a symptom of a very serious condition. Also, pain is not a symptom in the initial stages of many urinary cancers. HOME CARE INSTRUCTIONS   Drink lots of fluid, 3-4 quarts a day. If you have been diagnosed with an infection, cranberry juice is especially recommended, in addition to large amounts of water.  Avoid caffeine, tea, and carbonated beverages because they tend to irritate the bladder.  Avoid alcohol because it may irritate the prostate.  Take all medicines as directed by your health care provider.  If you were prescribed an antibiotic medicine, finish it all even if you start to feel better.  If you have been diagnosed with a kidney stone, follow your health care provider's instructions regarding straining your urine to catch the stone.  Empty your bladder often. Avoid holding urine for long periods of time.  After a bowel movement, women should cleanse  front to back. Use each tissue only once.  Empty your bladder before and after sexual intercourse if you are a male. SEEK MEDICAL CARE IF:  You develop back pain.  You have a fever.  You have a feeling of sickness in your stomach (nausea) or vomiting.  Your symptoms are not better in 3 days. Return sooner if you are getting worse. SEEK IMMEDIATE MEDICAL CARE IF:   You develop severe vomiting and are unable to keep the medicine down.  You develop severe back or abdominal pain despite taking your medicines.  You begin passing a large amount of blood or clots in your urine.  You feel extremely weak or faint, or you pass out. MAKE SURE YOU:   Understand these instructions.  Will watch your condition.  Will get help right away if you are not doing well or get worse. Document Released: 01/30/2005 Document Revised: 06/16/2013 Document Reviewed: 09/30/2012 [facility] Patient Information 2015 [place], [place]. This information is not intended to replace advice given to you by your health care provider. Make sure you discuss any questions you have with your health care provider.  Urinary Tract Infection Urinary tract infections (UTIs) can develop anywhere along your urinary tract. Your urinary tract is your body's drainage system for removing wastes and extra water. Your urinary tract includes two kidneys, two ureters, a bladder, and a urethra. Your kidneys are a pair of bean-shaped organs. Each kidney is about the size of your fist. They are located below your ribs, one on  each side of your spine. CAUSES Infections are caused by microbes, which are microscopic organisms, including fungi, viruses, and bacteria. These organisms are so small that they can only be seen through a microscope. Bacteria are the microbes that most commonly cause UTIs. SYMPTOMS  Symptoms of UTIs may vary by age and gender of the patient and by the location of the infection. Symptoms in young women typically include a  frequent and intense urge to urinate and a painful, burning feeling in the bladder or urethra during urination. Older women and men are more likely to be tired, shaky, and weak and have muscle aches and abdominal pain. A fever may mean the infection is in your kidneys. Other symptoms of a kidney infection include pain in your back or sides below the ribs, nausea, and vomiting. DIAGNOSIS To diagnose a UTI, your caregiver will ask you about your symptoms. Your caregiver also will ask to provide a urine sample. The urine sample will be tested for bacteria and white blood cells. White blood cells are made by your body to help fight infection. TREATMENT  Typically, UTIs can be treated with medication. Because most UTIs are caused by a bacterial infection, they usually can be treated with the use of antibiotics. The choice of antibiotic and length of treatment depend on your symptoms and the type of bacteria causing your infection. HOME CARE INSTRUCTIONS  If you were prescribed antibiotics, take them exactly as your caregiver instructs you. Finish the medication even if you feel better after you have only taken some of the medication.  Drink enough water and fluids to keep your urine clear or pale yellow.  Avoid caffeine, tea, and carbonated beverages. They tend to irritate your bladder.  Empty your bladder often. Avoid holding urine for long periods of time.  Empty your bladder before and after sexual intercourse.  After a bowel movement, women should cleanse from front to back. Use each tissue only once. SEEK MEDICAL CARE IF:   You have back pain.  You develop a fever.  Your symptoms do not begin to resolve within 3 days. SEEK IMMEDIATE MEDICAL CARE IF:   You have severe back pain or lower abdominal pain.  You develop chills.  You have nausea or vomiting.  You have continued burning or discomfort with urination. MAKE SURE YOU:   Understand these instructions.  Will watch your  condition.  Will get help right away if you are not doing well or get worse. Document Released: 11/09/2004 Document Revised: 08/01/2011 Document Reviewed: 03/10/2011 [facility] Patient Information 2015 [place], [place]. This information is not intended to replace advice given to you by your health care provider. Make sure you discuss any questions you have with your health care provider.

## 2014-01-25 NOTE — ED Notes (Signed)
MD at bedside. 

## 2014-01-25 NOTE — ED Provider Notes (Signed)
CSN: [id number]     Arrival date & time 01/25/14  0623 History   First MD Initiated Contact with Patient 01/25/14 0901     Chief Complaint  Patient presents with  . Flank Pain  . Urinary Frequency     (Consider location/radiation/quality/duration/timing/severity/associated sxs/prior Treatment) HPI Comments: PT comes in with cc of urinary complains and flank pain, Rt. Pt has hx of BPH and DM, no hx of renal stones. Symptoms started at 2:30 am, with dysuria, and he has had polyuria as well. No hematuria. PT started having pain in his right back at 2. Pt has had a UTI, about 2 years ago. No n/v/f/c. No anterior abd pain.  Patient is a 68 y.o. male presenting with flank pain and frequency. The history is provided by the patient.  Flank Pain Pertinent negatives include no chest pain, no abdominal pain and no shortness of breath.  Urinary Frequency Pertinent negatives include no chest pain, no abdominal pain and no shortness of breath.    Past Medical History  Diagnosis Date  . GERD (gastroesophageal reflux disease)   . Gout   . History of basal cell carcinoma     W-S Derm  . History of squamous cell carcinoma   . Hyperlipidemia   . Tachycardia   . Hypertension   . Allergy   . Rosacea   . Diabetes mellitus   . Arthritis   . Obesity    Past Surgical History  Procedure Laterality Date  . Tonsillectomy    . Pilonidal cyst excision    . Knee arthroscopy      R  . Colonoscopy with polypectomy      X3  . Esophagus dilated      X3  . Shoulder surgery      right shoulder  . Total knee arthroplasty      RIGHT  . Knee arthroscopy      bilaterally  . Total ethmoidectomy & sphenoidectomiy Bilateral 05/27/13    also reduction of turbinates   Family History  Problem Relation Age of Onset  . Lung cancer Mother     smoker  . Heart attack Brother 8    CBAG  . Lung cancer Maternal Aunt     smoker  . Heart attack Paternal Grandmother 50  . Heart disease Father     Rheumatic  Heart Disease  . Heart attack Paternal Grandfather 31  . Diabetes Neg Hx   . Stroke Neg Hx   . Colon cancer Neg Hx   . Pancreatic cancer Neg Hx   . Rectal cancer Neg Hx   . Stomach cancer Neg Hx    History  Substance Use Topics  . Smoking status: Former Smoker    Types: Cigarettes    Quit date: 10/17/1973  . Smokeless tobacco: Never Used  . Alcohol Use: No    Review of Systems  Constitutional: Negative for activity change and appetite change.  Respiratory: Negative for cough and shortness of breath.   Cardiovascular: Negative for chest pain.  Gastrointestinal: Negative for abdominal pain.  Genitourinary: Positive for frequency and flank pain. Negative for dysuria.      Allergies  Bupropion hcl; Codeine; Escitalopram oxalate; Sertraline hcl; Sulfa antibiotics; and Sulfur  Home Medications   Prior to Admission medications   Medication Sig Start Date End Date Taking? Authorizing Provider  acetaminophen (TYLENOL) 325 MG tablet Take by mouth every 6 (six) hours as needed for mild pain or moderate pain.   Yes Historical Provider, MD  allopurinol (ZYLOPRIM) 300 MG tablet Take 150-300 mg by mouth 2 (two) times daily. 1 tablet (300 mg) in the morning and 0.5 tablets (150 mg) at night   Yes Historical Provider, MD  aspirin EC 81 MG tablet Take 81 mg by mouth daily.   Yes Historical Provider, MD  Calcium Carbonate (CALCIUM 600) 1500 MG TABS Take 1,500 mg by mouth daily.    Yes Historical Provider, MD  cetirizine (ZYRTEC) 10 MG tablet Take 10 mg by mouth daily.   Yes Historical Provider, MD  Cholecalciferol (VITAMIN D PO) Take 1 tablet by mouth daily.   Yes Historical Provider, MD  Coenzyme Q10 (CO Q 10 PO) Take 1 capsule by mouth daily.    Yes Historical Provider, MD  diphenhydramine-acetaminophen (TYLENOL PM) 25-500 MG TABS Take 1 tablet by mouth at bedtime as needed (sleep).    Yes Historical Provider, MD  doxazosin (CARDURA) 4 MG tablet Take 1 tablet (4 mg total) by mouth at  bedtime. 08/24/11  Yes Hendricks Limes, MD  meloxicam (MOBIC) 7.5 MG tablet Take 1 tablet (7.5 mg total) by mouth daily. 05/27/12  Yes Hendricks Limes, MD  metFORMIN (GLUCOPHAGE) 500 MG tablet Take 1 tablet (500 mg total) by mouth daily with breakfast. 1 by mouth daily with breakfast, LABS DUE Patient taking differently: Take 500 mg by mouth daily with breakfast. LABS DUE 05/27/12  Yes Hendricks Limes, MD  metoprolol tartrate (LOPRESSOR) 25 MG tablet Take 12.5 mg by mouth 2 (two) times daily.   Yes Historical Provider, MD  Multiple Vitamin (MULTIVITAMIN PO) Take 1 capsule by mouth daily.    Yes Historical Provider, MD  mupirocin cream (BACTROBAN) 2 % Apply 1 application topically daily as needed (skin irritation).    Yes Historical Provider, MD  Omega-3 Fatty Acids (FISH OIL) 1000 MG CAPS Take 1 capsule by mouth daily.    Yes Historical Provider, MD  omeprazole (PRILOSEC) 20 MG capsule Take 20 mg by mouth daily.    Yes Historical Provider, MD  pseudoephedrine-acetaminophen (TYLENOL SINUS) 30-500 MG TABS Take 2 tablets by mouth every 4 (four) hours as needed (headache).   Yes Historical Provider, MD  pseudoephedrine-guaifenesin (MUCINEX D) 60-600 MG per tablet Take 1 tablet by mouth daily.    Yes Historical Provider, MD  spironolactone (ALDACTONE) 25 MG tablet Take 1 tablet (25 mg total) by mouth daily. 05/22/13  Yes Hendricks Limes, MD  cephALEXin (KEFLEX) 500 MG capsule Take 1 capsule (500 mg total) by mouth 4 (four) times daily. 01/25/14   Varney Biles, MD  traMADol (ULTRAM) 50 MG tablet Take 1 tablet (50 mg total) by mouth every 6 (six) hours as needed. 01/25/14   Darran Gabay, MD   BP 104/46 mmHg  Pulse 79  Temp(Src) 98.2 F (36.8 C) (Oral)  Resp 14  Ht 5\' 9"  (1.753 m)  Wt 310 lb (140.615 kg)  BMI 45.76 kg/m2  SpO2 96% Physical Exam  Constitutional: He is oriented to person, place, and time. He appears well-developed.  HENT:  Head: Normocephalic and atraumatic.  Eyes: Conjunctivae  and EOM are normal. Pupils are equal, round, and reactive to light.  Neck: Normal range of motion. Neck supple.  Cardiovascular: Normal rate, regular rhythm and normal heart sounds.   Pulmonary/Chest: Effort normal and breath sounds normal. No respiratory distress. He has no wheezes.  Abdominal: Soft. Bowel sounds are normal. He exhibits no distension. There is no tenderness. There is no rebound and no guarding.  Neurological: He is alert  and oriented to person, place, and time.  Skin: Skin is warm.  Nursing note and vitals reviewed.   ED Course  Procedures (including critical care time) Labs Review Labs Reviewed  URINALYSIS, ROUTINE W REFLEX MICROSCOPIC - Abnormal; Notable for the following:    Hgb urine dipstick LARGE (*)    All other components within normal limits  COMPREHENSIVE METABOLIC PANEL - Abnormal; Notable for the following:    Glucose, Bld 137 (*)    GFR calc non Af Amer 69 (*)    GFR calc Af Amer 80 (*)    All other components within normal limits  URINE CULTURE  CBC WITH DIFFERENTIAL  URINE MICROSCOPIC-ADD ON    Imaging Review No results found.   EKG Interpretation None      MDM   Final diagnoses:  UTI (lower urinary tract infection)  Hematuria   Pt comes in with cc of dysuria, polyuria. His UA looks clear, except microscopic blood. Urine is cultured. Clinically, pt has uti, and since he is diabetic, we will treat as such. Renal stone is possible, as he has had flank pain as well (currently pain free). Pyelo possible as well. Return precautions for pyelo and stones discussed. Will d.c.    Varney Biles, MD 01/25/14 1007

## 2014-01-27 ENCOUNTER — Encounter: Payer: Self-pay | Admitting: Cardiology

## 2014-01-27 ENCOUNTER — Ambulatory Visit (INDEPENDENT_AMBULATORY_CARE_PROVIDER_SITE_OTHER): Payer: Medicare Other | Admitting: Cardiology

## 2014-01-27 VITALS — BP 126/76 | HR 73 | Ht 69.0 in | Wt 315.0 lb

## 2014-01-27 DIAGNOSIS — R0609 Other forms of dyspnea: Secondary | ICD-10-CM

## 2014-01-27 DIAGNOSIS — E785 Hyperlipidemia, unspecified: Secondary | ICD-10-CM

## 2014-01-27 DIAGNOSIS — E8881 Metabolic syndrome: Secondary | ICD-10-CM

## 2014-01-27 DIAGNOSIS — I10 Essential (primary) hypertension: Secondary | ICD-10-CM

## 2014-01-27 DIAGNOSIS — R0600 Dyspnea, unspecified: Secondary | ICD-10-CM

## 2014-01-27 NOTE — Patient Instructions (Signed)
Your physician recommends that you continue on your current medications as directed. Please refer to the Current Medication list given to you today.  Your physician wants you to follow-up in: 1 year with Dr. Nelson. You will receive a reminder letter in the mail two months in advance. If you don't receive a letter, please call our office to schedule the follow-up appointment.  

## 2014-01-27 NOTE — Progress Notes (Signed)
Patient ID: Terry Lyons, male   DOB: 06/24/1945, 68 y.o.   MRN: [id number]     Patient Name: Terry Lyons Date of Encounter: 01/27/2014  Primary Care Provider:  [name], MD Primary Cardiologist: Dorothy Spark   Problem List   Past Medical History  Diagnosis Date  . GERD (gastroesophageal reflux disease)   . Gout   . History of basal cell carcinoma     W-S Derm  . History of squamous cell carcinoma   . Hyperlipidemia   . Tachycardia   . Hypertension   . Allergy   . Rosacea   . Diabetes mellitus   . Arthritis   . Obesity    Past Surgical History  Procedure Laterality Date  . Tonsillectomy    . Pilonidal cyst excision    . Knee arthroscopy      R  . Colonoscopy with polypectomy      X3  . Esophagus dilated      X3  . Shoulder surgery      right shoulder  . Total knee arthroplasty      RIGHT  . Knee arthroscopy      bilaterally  . Total ethmoidectomy & sphenoidectomiy Bilateral 05/27/13    also reduction of turbinates    Allergies  Allergies  Allergen Reactions  . Bupropion Hcl     REACTION: insomnia  . Codeine     REACTION: nausea  . Escitalopram Oxalate     REACTION: insomnia  . Sertraline Hcl     REACTION: insomnia  . Sulfa Antibiotics     Pt can not remember  . Sulfur Other (See Comments)    HPI  68 year old male with metabolic syndrome that includes hyper triglyceridemia, obesity, type II non-insulin-dependent diabetes, physical inactivity who is coming with concern of worsening dyspnea on exertion. He states that he completely decrease his physical activity and right now he is walking minimally he attributes it that it started with knee replacement however, walking to shower would make him short of breath. He denies any chest pain, palpitations or syncope. He has chronic lower extremity edema that is nonpitting and has been there for 3-4 years. On he had a nuclear stress test in the past that was negative. It has been over 7 years doesn't  remember exactly when. He also had an echocardiogram in 2010 that was nonconclusive with possible inferior wall hypokinesis. The patient has a very significant family history of coronary artery disease. His brother died of myocardial infarction 39, 2 of his grandmothers both died in their 36s of myocardial infarction. His father died of complications of rheumatic heart disease at age of 59. He knows his high triglycerides and was prescribed Lipitor but is reluctant because he is worrying about side effects with worsening diabetes.  Home Medications  Prior to Admission medications   Medication Sig Start Date End Date Taking? Authorizing Provider  allopurinol (ZYLOPRIM) 300 MG tablet Take 150-300 mg by mouth 2 (two) times daily. 1 tablet (300 mg) in the morning and 0.5 tablets (150 mg) at night   Yes Historical Provider, MD  aspirin EC 81 MG tablet Take 81 mg by mouth daily.   Yes Historical Provider, MD  Calcium Carbonate (CALCIUM 600) 1500 MG TABS Take 1,500 mg by mouth daily.    Yes Historical Provider, MD  Cholecalciferol (VITAMIN D PO) Take 1 tablet by mouth daily.   Yes Historical Provider, MD  Coenzyme Q10 (CO Q 10 PO) Take 1 capsule by  mouth daily.    Yes Historical Provider, MD  diphenhydramine-acetaminophen (TYLENOL PM) 25-500 MG TABS Take 1 tablet by mouth at bedtime as needed (sleep).    Yes Historical Provider, MD  doxazosin (CARDURA) 4 MG tablet Take 1 tablet (4 mg total) by mouth at bedtime. 08/24/11  Yes Hendricks Limes, MD  meloxicam (MOBIC) 7.5 MG tablet Take 1 tablet (7.5 mg total) by mouth daily. 05/27/12  Yes Hendricks Limes, MD  metFORMIN (GLUCOPHAGE) 500 MG tablet Take 1 tablet (500 mg total) by mouth daily with breakfast. 1 by mouth daily with breakfast, LABS DUE Patient taking differently: Take 500 mg by mouth daily with breakfast. LABS DUE 05/27/12  Yes Hendricks Limes, MD  metoprolol tartrate (LOPRESSOR) 25 MG tablet Take 12.5 mg by mouth 2 (two) times daily.   Yes Historical  Provider, MD  Multiple Vitamin (MULTIVITAMIN PO) Take 1 capsule by mouth daily.    Yes Historical Provider, MD  mupirocin cream (BACTROBAN) 2 % Apply 1 application topically daily.   Yes Historical Provider, MD  Omega-3 Fatty Acids (FISH OIL) 1000 MG CAPS Take 1 capsule by mouth daily.    Yes Historical Provider, MD  omeprazole (PRILOSEC) 20 MG capsule Take 20-40 mg by mouth daily.    Yes Historical Provider, MD  pseudoephedrine-acetaminophen (TYLENOL SINUS) 30-500 MG TABS Take 2 tablets by mouth every 4 (four) hours as needed (headache).   Yes Historical Provider, MD  pseudoephedrine-guaifenesin (MUCINEX D) 60-600 MG per tablet Take 1 tablet by mouth daily.    Yes Historical Provider, MD  spironolactone (ALDACTONE) 25 MG tablet Take 1 tablet (25 mg total) by mouth daily. 05/22/13  Yes Hendricks Limes, MD    Family History  Family History  Problem Relation Age of Onset  . Lung cancer Mother     smoker  . Heart attack Brother 89    CBAG  . Lung cancer Maternal Aunt     smoker  . Heart attack Paternal Grandmother 2  . Heart disease Father     Rheumatic Heart Disease  . Heart attack Paternal Grandfather 12  . Diabetes Neg Hx   . Stroke Neg Hx   . Colon cancer Neg Hx   . Pancreatic cancer Neg Hx   . Rectal cancer Neg Hx   . Stomach cancer Neg Hx     Social History  History   Social History  . Marital Status: Married    Spouse Name: N/A    Number of Children: N/A  . Years of Education: N/A   Occupational History  . Not on file.   Social History Main Topics  . Smoking status: Former Smoker    Types: Cigarettes    Quit date: 10/17/1973  . Smokeless tobacco: Never Used  . Alcohol Use: No  . Drug Use: No  . Sexual Activity: Not on file   Other Topics Concern  . Not on file   Social History Narrative     Review of Systems, as per HPI, otherwise negative General:  No chills, fever, night sweats or weight changes.  Cardiovascular:  No chest pain, dyspnea on exertion,  edema, orthopnea, palpitations, paroxysmal nocturnal dyspnea. Dermatological: No rash, lesions/masses Respiratory: No cough, dyspnea Urologic: No hematuria, dysuria Abdominal:   No nausea, vomiting, diarrhea, bright red blood per rectum, melena, or hematemesis Neurologic:  No visual changes, wkns, changes in mental status. All other systems reviewed and are otherwise negative except as noted above.  Physical Exam  Blood pressure 126/76,  pulse 73, height 5\' 9"  (1.753 m), weight 315 lb (142.883 kg), SpO2 93 %.  General: Pleasant, NAD Psych: Normal affect. Neuro: Alert and oriented X 3. Moves all extremities spontaneously. HEENT: Normal  Neck: Supple without bruits or JVD. Lungs:  Resp regular and unlabored, CTA. Heart: RRR no s3, s4, or murmurs. Abdomen: Soft, non-tender, non-distended, BS + x 4.  Extremities: No clubbing, cyanosis, mild non-pitting edema. DP/PT/Radials 2+ and equal bilaterally.  Labs:  No results for input(s): CKTOTAL, CKMB, TROPONINI in the last 72 hours. Lab Results  Component Value Date   WBC 6.1 01/25/2014   HGB 14.2 01/25/2014   HCT 42.9 01/25/2014   MCV 93.9 01/25/2014   PLT 192 01/25/2014    Lab Results  Component Value Date   DDIMER * 06/09/2008    1.34        AT THE INHOUSE ESTABLISHED CUTOFF VALUE OF 0.48 ug/mL FEU, THIS ASSAY HAS BEEN DOCUMENTED IN THE LITERATURE TO HAVE A SENSITIVITY AND NEGATIVE PREDICTIVE VALUE OF AT LEAST 98 TO 99%.  THE TEST RESULT SHOULD BE CORRELATED WITH AN ASSESSMENT OF THE CLINICAL PROBABILITY OF DVT / VTE.   Invalid input(s): POCBNP    Component Value Date/Time   NA 139 01/25/2014 0858   K 5.1 01/25/2014 0858   CL 101 01/25/2014 0858   CO2 26 01/25/2014 0858   GLUCOSE 137* 01/25/2014 0858   BUN 16 01/25/2014 0858   CREATININE 1.06 01/25/2014 0858   CALCIUM 9.5 01/25/2014 0858   PROT 7.1 01/25/2014 0858   ALBUMIN 3.7 01/25/2014 0858   AST 25 01/25/2014 0858   ALT 37 01/25/2014 0858   ALKPHOS 99  01/25/2014 0858   BILITOT 0.3 01/25/2014 0858   GFRNONAA 69* 01/25/2014 0858   GFRAA 80* 01/25/2014 0858   Lab Results  Component Value Date   CHOL 188 12/21/2010   HDL 45.30 12/21/2010   TRIG 252.0* 12/21/2010    Accessory Clinical Findings  Echocardiogram - 2010  SUMMARY - EF difficult to assess due to poor images. Possible inferobasal    hypokinesis Left ventricular wall thickness was mildly    increased. - The aortic valve was mildly calcified. - Posterior annular calcification  ECG - NSR, normal ECG  Lexiscan nuclear stress test 01/09/14 Impression Exercise Capacity: Lexiscan with no exercise. BP Response: Normal blood pressure response. Clinical Symptoms: Dyspnea.  ECG Impression: No significant ST segment change suggestive of ischemia. Comparison with Prior Nuclear Study: No images to compare  Overall Impression: Low risk stress nuclear study with a fixed, small, mild apical lateral and apical perfusion defect. Possible attenuation but cannot rule out small infarction. No ischemia. .  LV Ejection Fraction: 58%. LV Wall Motion: NL LV Function; NL Wall Motion     Assessment & Plan  68 year old male  1. Dyspnea on exertion - the patient has all the risk factor including that obesity, hypertension, untreated hyperlipidemia, obesity, physical inactivity, significant family history of premature coronary artery disease, prior history of smoking and he has also been exposed to agent orange. Lexiscan nuclear stress test was negative for scar or ischemia. We will focus on aggressive risk factor modification predominantly lipids management.  2. Hypertension - controlled  3. Hyperlipidemia - patient is strongly advised to start taking Lipitor, he is strongly opinionated again states. He's advised to start rabies TIAs in addition he will be taking fish oil and wants to try to niacin to his 3 is taking.   4. Metabolic syndrome X, obesity - patient  advised on proper exercise level and intensity he seems to be motivated and wanting to join [employer].  Follow-up in 1 year.   Dorothy Spark, MD, [facility] 01/27/2014, 8:56 AM

## 2014-01-28 LAB — URINE CULTURE
Colony Count: NO GROWTH
Culture: NO GROWTH
Special Requests: NORMAL

## 2014-04-16 ENCOUNTER — Ambulatory Visit: Payer: Medicare Other | Admitting: Podiatrist

## 2014-06-22 ENCOUNTER — Telehealth: Payer: Self-pay | Admitting: Internal Medicine

## 2014-06-22 NOTE — Telephone Encounter (Signed)
Pts wife states he is having difficulty swallowing. States last time this happened Dr. Henrene Pastor did an EGD and stretched his esophagus. Pt was able to take his meds this am. Pt scheduled to see Cecille Rubin Hvozdovic, PA-C tomorrow at 9:15am. Pt aware of appt.

## 2014-06-23 ENCOUNTER — Ambulatory Visit (INDEPENDENT_AMBULATORY_CARE_PROVIDER_SITE_OTHER): Payer: PPO | Admitting: Physician Assistant

## 2014-06-23 ENCOUNTER — Encounter: Payer: Self-pay | Admitting: Physician Assistant

## 2014-06-23 ENCOUNTER — Other Ambulatory Visit (INDEPENDENT_AMBULATORY_CARE_PROVIDER_SITE_OTHER): Payer: PPO

## 2014-06-23 VITALS — BP 110/76 | HR 80 | Ht 69.0 in | Wt 310.0 lb

## 2014-06-23 DIAGNOSIS — K219 Gastro-esophageal reflux disease without esophagitis: Secondary | ICD-10-CM

## 2014-06-23 DIAGNOSIS — R252 Cramp and spasm: Secondary | ICD-10-CM

## 2014-06-23 DIAGNOSIS — R1310 Dysphagia, unspecified: Secondary | ICD-10-CM

## 2014-06-23 LAB — MAGNESIUM: Magnesium: 2 mg/dL (ref 1.5–2.5)

## 2014-06-23 NOTE — Patient Instructions (Addendum)
You have been scheduled for an endoscopy. Please follow written instructions given to you at your visit today. If you use inhalers (even only as needed), please bring them with you on the day of your procedure.  Your physician has requested that you go to the basement for  lab work before leaving today  Please continue taking omeprazole 20mg  1 by moth in the am 30 min prior to breakfast

## 2014-06-24 ENCOUNTER — Encounter: Payer: Self-pay | Admitting: Physician Assistant

## 2014-06-24 ENCOUNTER — Telehealth: Payer: Self-pay

## 2014-06-24 NOTE — Telephone Encounter (Signed)
Spoke with patient's wife and told her Dr. Henrene Pastor was able to schedule patient's EGD with dilation on 06/26/2014 at 4:00pm.  I told her they needed to arrive at 3:00pm, and that patient could drink anything on the clear liquid list until 1:00pm then nothing by mouth.  I told her to have patient hold his Metformin the morning of the procedure.  Patient's wife acknowledged and understood.

## 2014-06-24 NOTE — Telephone Encounter (Signed)
Spoke with Lanelle Bal for approval of 06/26/2014 4:00 slot and gave patient's wife the date and times of the procedure.

## 2014-06-24 NOTE — Progress Notes (Signed)
Patient ID: Terry Lyons, male   DOB: 02-16-1945, 69 y.o.   MRN: [id number]     History of Present Illness: Terry Lyons  Is a 69 year old male known to Dr.. He last had an EGD on 07/20/2008 at which time he was noted to have a normal esophagus, status post [place] dilation with 27 [place]. He has a known history of hypertension, obesity, osteoarthritis, colon polyps, and gastroesophageal reflux disease complicated by peptic stricture. He has a history of gout , history of basal cell carcinoma, history squama cell carcinoma, hyperlipidemia , rosacea, diabetes, and fatty liver. Presents today with complaints of dysphagia of 1-1-1/2 years duration that is been getting progressively worse. He has dysphagia to solids and has recently had multiple episodes where he has had to spit his food out because it won't go down. Last evening he tried eating a piece of pork and states it sat in his esophagus for 3-4 hours before it would pass. He has also been experiencing nausea for several months which is worse on an empty stomach an temporarily alleviated with food. He has not been using his omeprazole on a regular basis. He voices concerns about regular use of a PPI and its possible side effects. He reports that he has been getting some intermittent leg cramps in his calves at night denies claudication type symptoms. Bowel movements have been normal and he has no bright red blood per rectum or melena.   Past Medical History  Diagnosis Date  . GERD (gastroesophageal reflux disease)   . Gout   . History of basal cell carcinoma     W-S Derm  . History of squamous cell carcinoma   . Hyperlipidemia   . Tachycardia   . Hypertension   . Allergy   . Rosacea   . Diabetes mellitus   . Arthritis   . Obesity     Past Surgical History  Procedure Laterality Date  . Tonsillectomy    . Pilonidal cyst excision    . Knee arthroscopy      R  . Colonoscopy with polypectomy      X3  . Esophagus dilated      X3  . Shoulder  surgery      right shoulder  . Total knee arthroplasty      RIGHT  . Knee arthroscopy      bilaterally  . Total ethmoidectomy & sphenoidectomiy Bilateral 05/27/13    also reduction of turbinates   Family History  Problem Relation Age of Onset  . Lung cancer Mother     smoker  . Heart attack Brother 8    CBAG  . Lung cancer Maternal Aunt     smoker  . Heart attack Paternal Grandmother 66  . Heart disease Father     Rheumatic Heart Disease  . Heart attack Paternal Grandfather 27  . Diabetes Neg Hx   . Stroke Neg Hx   . Colon cancer Neg Hx   . Pancreatic cancer Neg Hx   . Rectal cancer Neg Hx   . Stomach cancer Neg Hx    History  Substance Use Topics  . Smoking status: Former Smoker    Types: Cigarettes    Quit date: 10/17/1973  . Smokeless tobacco: Never Used  . Alcohol Use: No   Current Outpatient Prescriptions  Medication Sig Dispense Refill  . acetaminophen (TYLENOL) 325 MG tablet Take by mouth every 6 (six) hours as needed for mild pain or moderate pain.    Marland Kitchen  allopurinol (ZYLOPRIM) 300 MG tablet Take 150-300 mg by mouth 2 (two) times daily. 1 tablet (300 mg) in the morning and 0.5 tablets (150 mg) at night    . aspirin EC 81 MG tablet Take 81 mg by mouth daily.    . Calcium Carbonate (CALCIUM 600) 1500 MG TABS Take 1,500 mg by mouth daily.     . cetirizine (ZYRTEC) 10 MG tablet Take 10 mg by mouth daily.    . Cholecalciferol (VITAMIN D PO) Take 1 tablet by mouth daily.    . Coenzyme Q10 (CO Q 10 PO) Take 1 capsule by mouth daily.     . diphenhydramine-acetaminophen (TYLENOL PM) 25-500 MG TABS Take 1 tablet by mouth at bedtime as needed (sleep).     Marland Kitchen doxazosin (CARDURA) 4 MG tablet Take 1 tablet (4 mg total) by mouth at bedtime. 90 tablet 1  . meloxicam (MOBIC) 7.5 MG tablet Take 1 tablet (7.5 mg total) by mouth daily. 90 tablet 2  . metFORMIN (GLUCOPHAGE) 500 MG tablet Take 1 tablet (500 mg total) by mouth daily with breakfast. 1 by mouth daily with breakfast, LABS  DUE (Patient taking differently: Take 500 mg by mouth daily with breakfast. LABS DUE) 30 tablet 0  . metoprolol tartrate (LOPRESSOR) 25 MG tablet Take 12.5 mg by mouth 2 (two) times daily.    . Multiple Vitamin (MULTIVITAMIN PO) Take 1 capsule by mouth daily.     . mupirocin cream (BACTROBAN) 2 % Apply 1 application topically daily as needed (skin irritation).     . Omega-3 Fatty Acids (FISH OIL) 1000 MG CAPS Take 1 capsule by mouth daily.     Marland Kitchen omeprazole (PRILOSEC) 20 MG capsule Take 20 mg by mouth daily.     . pseudoephedrine-acetaminophen (TYLENOL SINUS) 30-500 MG TABS Take 2 tablets by mouth every 4 (four) hours as needed (headache).    . pseudoephedrine-guaifenesin (MUCINEX D) 60-600 MG per tablet Take 1 tablet by mouth daily.     . Red Yeast Rice Extract (RED YEAST RICE PO) Take 1 capsule by mouth daily.    . Saw Palmetto, Serenoa repens, (SAW PALMETTO PO) Take 1 capsule by mouth daily.    Marland Kitchen spironolactone (ALDACTONE) 25 MG tablet Take 1 tablet (25 mg total) by mouth daily. 30 tablet 3  . traMADol (ULTRAM) 50 MG tablet Take 1 tablet (50 mg total) by mouth every 6 (six) hours as needed. 15 tablet 0   No current facility-administered medications for this visit.   Allergies  Allergen Reactions  . Bupropion Hcl     REACTION: insomnia  . Codeine     REACTION: nausea  . Escitalopram Oxalate     REACTION: insomnia  . Sertraline Hcl     REACTION: insomnia  . Sulfa Antibiotics     Pt can not remember  . Sulfur Other (See Comments)      Review of Systems: Gen: Denies any fever, chills, sweats, anorexia, fatigue, weakness, malaise, weight loss, and sleep disorder CV: Denies chest pain, angina, palpitations, syncope, orthopnea, PND, peripheral edema, and claudication. Resp: Denies dyspnea at rest, dyspnea with exercise, cough, sputum, wheezing, coughing up blood, and pleurisy. GI: Denies vomiting blood, jaundice, and fecal incontinence.    Has dysphagia to solids. Has nausea on an  empty stomach. GU : Denies urinary burning, blood in urine, urinary frequency, urinary hesitancy, nocturnal urination, and urinary incontinence. MS: has joint pain. Has intermittent cramps and at night. Derm: Denies rash, itching, dry skin, hives, moles, warts,  or unhealing ulcers.  Psych: Denies depression, anxiety, memory loss, suicidal ideation, hallucinations, paranoia, and confusion. Heme: Denies bruising, bleeding, and enlarged lymph nodes. Neuro:  Denies any headaches, dizziness, paresthesia   Procedures: Colonoscopy 9/10/15ENDOSCOPIC IMPRESSION: 1. Diminutive polyp was found in the ascending colon; polypectomy was performed with a cold snare 2. The colon mucosa was otherwise normal RECOMMENDATIONS: 1. Follow up colonoscopy in 5 years  EGD 07/20/2008  Normal EGD, status post esophageal dilation with [place].   Physical Exam: General: Pleasant, well developed male in no acute distress Head: Normocephalic and atraumatic Eyes:  sclerae anicteric, conjunctiva pink  Ears: Normal auditory acuity Lungs: Clear throughout to auscultation Heart: Regular rate and rhythm Abdomen: Soft, non distended, non-tender. No masses, no hepatomegaly. Normal bowel sounds Musculoskeletal: Symmetrical with no gross deformities  Extremities: No edema  Neurological: Alert oriented x 4, grossly nonfocal Psychological:  Alert and cooperative. Normal mood and affect  Assessment and Recommendations:  #1. GERD. Patient has been having intermittent nausea with frequent dysphagia , likely due to poorly controlled GERD along with his inconsistent use of proton pump inhibitor. Antireflux regimen has been reviewed. He will be scheduled for an EGD with possible dilation.The risks, benefits, and alternatives to endoscopy with possible biopsy and possible dilation were discussed with the patient and they consent to proceed.   The procedure will be scheduled with Dr. Henrene Pastor.    #2. Intermittent leg cramps.  Patient describes occasional cramping in his calves at night. A magnesium level will be obtained. Normal he will follow up with his PCP for this problem.     Quinlyn Tep, Deloris Ping 06/24/2014,

## 2014-06-24 NOTE — Telephone Encounter (Signed)
-----   Message from [place], [place] sent at 06/24/2014  8:26 AM EDT ----- Regarding: FW: EGD dilation in [place]   ----- Message -----    From: Irene Shipper, MD    Sent: 06/23/2014   5:57 PM      To: Vita Barley Hvozdovic, PA-C Subject: EGD dilation in [place]                            Set Mr. Goethe up for EGD with dilation in the [facility] this Friday, May 13 at 4 PM. He may need approval for that slot as it is technically an overbook. However, I have 3 doubles and we should be able to run on time easily. Thanks

## 2014-06-25 ENCOUNTER — Encounter: Payer: Self-pay | Admitting: Internal Medicine

## 2014-06-25 NOTE — Progress Notes (Signed)
Case discussed with physician assistant. Agree with plans as outlined 

## 2014-06-26 ENCOUNTER — Ambulatory Visit (AMBULATORY_SURGERY_CENTER): Payer: PPO | Admitting: Internal Medicine

## 2014-06-26 ENCOUNTER — Encounter: Payer: Self-pay | Admitting: Internal Medicine

## 2014-06-26 VITALS — BP 116/79 | HR 78 | Temp 97.2°F | Resp 22 | Ht 69.0 in | Wt 310.0 lb

## 2014-06-26 DIAGNOSIS — K219 Gastro-esophageal reflux disease without esophagitis: Secondary | ICD-10-CM | POA: Diagnosis not present

## 2014-06-26 DIAGNOSIS — R1310 Dysphagia, unspecified: Secondary | ICD-10-CM | POA: Diagnosis not present

## 2014-06-26 LAB — GLUCOSE, CAPILLARY
Glucose-Capillary: 143 mg/dL — ABNORMAL HIGH (ref 65–99)
Glucose-Capillary: 115 mg/dL — ABNORMAL HIGH (ref 65–99)

## 2014-06-26 MED ORDER — SODIUM CHLORIDE 0.9 % IV SOLN: 500.0000 mL | INTRAVENOUS | Status: DC

## 2014-06-26 NOTE — Op Note (Signed)
[facility]  [address]. [place], [address]   ENDOSCOPY PROCEDURE REPORT  PATIENT: Tymeir, [place]  MR#: [id number] BIRTHDATE: Mar 24, 1945 , 69  yrs. old GENDER: male ENDOSCOPIST: Eustace Quail, MD REFERRED BY:  Crist Infante, M.D. PROCEDURE DATE:  06/26/2014 PROCEDURE:  EGD, diagnostic and Maloney dilation of esophagus  - 67 French ASA CLASS:     Class II INDICATIONS:  dysphagia and therapeutic procedure. Prior history of dysphagia with unremarkable EGD. Responded to empiric esophageal dilation, 22 [place], in 2010. Now with intermittent solid food dysphagia for one year. MEDICATIONS: Monitored anesthesia care, Propofol 100 mg IV, and Lidocaine 200 mg IV TOPICAL ANESTHETIC: none  DESCRIPTION OF PROCEDURE: After the risks benefits and alternatives of the procedure were thoroughly explained, informed consent was obtained.  The LB [id number] endoscope was introduced through the mouth and advanced to the second portion of the duodenum , Without limitations.  The instrument was slowly withdrawn as the mucosa was fully examined.  EXAM:Esophagus was grossly normal.  No apparent stricture or ring. The stomach was normal.  The duodenum was normal.  Retroflexed views revealed a hiatal hernia.     The scope was then withdrawn from the patient and the procedure completed. THERAPY: 54 French Maloney dilator was passed into the esophagus without resistance. Trace heme upon removal. Tolerated well  COMPLICATIONS: There were no immediate complications.  ENDOSCOPIC IMPRESSION: 1. Grossly normal EGD. Suspect subtle ring not appreciated on endoscopy status post dilation to 54 French 2. GERD  RECOMMENDATIONS: 1.  Clear liquids until 3 PM, then soft foods rest of day.  Resume prior diet tomorrow. 2.  Continue PPI  (reflux medication) daily) 3. GI follow-up as needed  REPEAT EXAM:  eSigned:  Eustace Quail, MD 06/26/2014 1:01 PM    EO:FHQR [place]ini, MD and The  Patient

## 2014-06-26 NOTE — Progress Notes (Signed)
Report to PACU, RN, vss, BBS= Clear.  

## 2014-06-26 NOTE — Patient Instructions (Addendum)
YOU HAD AN ENDOSCOPIC PROCEDURE TODAY AT [facility]:   Refer to the procedure report that was given to you for any specific questions about what was found during the examination.  If the procedure report does not answer your questions, please call your gastroenterologist to clarify.  If you requested that your care partner not be given the details of your procedure findings, then the procedure report has been included in a sealed envelope for you to review at your convenience later.  YOU SHOULD EXPECT: Some feelings of bloating in the abdomen. Passage of more gas than usual.  Walking can help get rid of the air that was put into your GI tract during the procedure and reduce the bloating. If you had a lower endoscopy (such as a colonoscopy or flexible sigmoidoscopy) you may notice spotting of blood in your stool or on the toilet paper. If you underwent a bowel prep for your procedure, you may not have a normal bowel movement for a few days.  Please Note:  You might notice some irritation and congestion in your nose or some drainage.  This is from the oxygen used during your procedure.  There is no need for concern and it should clear up in a day or so.  SYMPTOMS TO REPORT IMMEDIATELY:  Following upper endoscopy (EGD)  Vomiting of blood or coffee ground material  New chest pain or pain under the shoulder blades  Painful or persistently difficult swallowing  New shortness of breath  Fever of 100F or higher  Black, tarry-looking stools  For urgent or emergent issues, a gastroenterologist can be reached at any hour by calling [phone].   DIET: Your first meal following the procedure should be a small meal and then it is ok to progress to your normal diet. Heavy or fried foods are harder to digest and may make you feel nauseous or bloated.  Likewise, meals heavy in dairy and vegetables can increase bloating.  Drink plenty of fluids but you should avoid alcoholic beverages for 24  hours.  ACTIVITY:  You should plan to take it easy for the rest of today and you should NOT DRIVE or use heavy machinery until tomorrow (because of the sedation medicines used during the test).    FOLLOW UP: Our staff will call the number listed on your records the next business day following your procedure to check on you and address any questions or concerns that you may have regarding the information given to you following your procedure. If we do not reach you, we will leave a message.  However, if you are feeling well and you are not experiencing any problems, there is no need to return our call.  We will assume that you have returned to your regular daily activities without incident.  If any biopsies were taken you will be contacted by phone or by letter within the next 1-3 weeks.  Please call us at [phone] if you have not heard about the biopsies in 3 weeks.    SIGNATURES/CONFIDENTIALITY: You and/or your care partner have signed paperwork which will be entered into your electronic medical record.  These signatures attest to the fact that that the information above on your After Visit Summary has been reviewed and is understood.  Full responsibility of the confidentiality of this discharge information lies with you and/or your care-partner.  Post dilation diet given with instructions.  Continue omeprazole/prilosec as ordered.  GERD information given.

## 2014-06-29 ENCOUNTER — Telehealth: Payer: Self-pay | Admitting: *Deleted

## 2014-06-29 NOTE — Telephone Encounter (Signed)
  Follow up Call-  Call back number 06/26/2014 10/23/2013  Post procedure Call Back phone  # [phone]  Permission to leave phone message Yes Yes     Patient questions:  Message left to call if necessary.

## 2014-08-10 ENCOUNTER — Other Ambulatory Visit: Payer: Self-pay

## 2014-10-23 ENCOUNTER — Encounter (HOSPITAL_COMMUNITY): Payer: Self-pay | Admitting: Emergency Medicine

## 2014-10-23 DIAGNOSIS — R109 Unspecified abdominal pain: Secondary | ICD-10-CM | POA: Diagnosis present

## 2014-10-23 DIAGNOSIS — R079 Chest pain, unspecified: Secondary | ICD-10-CM | POA: Diagnosis not present

## 2014-10-23 DIAGNOSIS — M109 Gout, unspecified: Secondary | ICD-10-CM | POA: Insufficient documentation

## 2014-10-23 DIAGNOSIS — M1990 Unspecified osteoarthritis, unspecified site: Secondary | ICD-10-CM | POA: Diagnosis not present

## 2014-10-23 DIAGNOSIS — Z7982 Long term (current) use of aspirin: Secondary | ICD-10-CM | POA: Insufficient documentation

## 2014-10-23 DIAGNOSIS — Z87891 Personal history of nicotine dependence: Secondary | ICD-10-CM | POA: Insufficient documentation

## 2014-10-23 DIAGNOSIS — R1032 Left lower quadrant pain: Secondary | ICD-10-CM | POA: Insufficient documentation

## 2014-10-23 DIAGNOSIS — E785 Hyperlipidemia, unspecified: Secondary | ICD-10-CM | POA: Diagnosis not present

## 2014-10-23 DIAGNOSIS — E119 Type 2 diabetes mellitus without complications: Secondary | ICD-10-CM | POA: Insufficient documentation

## 2014-10-23 DIAGNOSIS — Z791 Long term (current) use of non-steroidal anti-inflammatories (NSAID): Secondary | ICD-10-CM | POA: Diagnosis not present

## 2014-10-23 DIAGNOSIS — R011 Cardiac murmur, unspecified: Secondary | ICD-10-CM | POA: Insufficient documentation

## 2014-10-23 DIAGNOSIS — I10 Essential (primary) hypertension: Secondary | ICD-10-CM | POA: Insufficient documentation

## 2014-10-23 DIAGNOSIS — Z79899 Other long term (current) drug therapy: Secondary | ICD-10-CM | POA: Insufficient documentation

## 2014-10-23 DIAGNOSIS — Z872 Personal history of diseases of the skin and subcutaneous tissue: Secondary | ICD-10-CM | POA: Diagnosis not present

## 2014-10-23 DIAGNOSIS — E669 Obesity, unspecified: Secondary | ICD-10-CM | POA: Diagnosis not present

## 2014-10-23 DIAGNOSIS — K219 Gastro-esophageal reflux disease without esophagitis: Secondary | ICD-10-CM | POA: Insufficient documentation

## 2014-10-23 DIAGNOSIS — Z85828 Personal history of other malignant neoplasm of skin: Secondary | ICD-10-CM | POA: Insufficient documentation

## 2014-10-23 DIAGNOSIS — R0602 Shortness of breath: Secondary | ICD-10-CM | POA: Diagnosis not present

## 2014-10-23 LAB — CBC
HCT: 43.9 % (ref 39.0–52.0)
Hemoglobin: 15.1 g/dL (ref 13.0–17.0)
MCH: 32 pg (ref 26.0–34.0)
MCHC: 34.4 g/dL (ref 30.0–36.0)
MCV: 93 fL (ref 78.0–100.0)
Platelets: 231 10*3/uL (ref 150–400)
RBC: 4.72 MIL/uL (ref 4.22–5.81)
RDW: 14.4 % (ref 11.5–15.5)
WBC: 9.4 10*3/uL (ref 4.0–10.5)

## 2014-10-23 MED ORDER — FENTANYL CITRATE (PF) 100 MCG/2ML IJ SOLN
50.0000 ug | Freq: Once | INTRAMUSCULAR | Status: AC
  Administered 2014-10-23: 50 ug via INTRAVENOUS

## 2014-10-23 MED ORDER — FENTANYL CITRATE (PF) 100 MCG/2ML IJ SOLN
INTRAMUSCULAR | Status: AC
  Administered 2014-10-23: 50 ug via INTRAVENOUS
  Filled 2014-10-23: qty 2

## 2014-10-23 NOTE — ED Notes (Addendum)
Pt states he went to urinate and developed sharp sudden onset of leftt sided flank pain. Denies any n/v/d or fevers at this time. Pt unable to sit down in triage due to pain. Pt states recent UTI but denies any hx of kidney stones.

## 2014-10-24 ENCOUNTER — Emergency Department (HOSPITAL_COMMUNITY)
Admission: EM | Admit: 2014-10-24 | Discharge: 2014-10-24 | Disposition: A | Discharge status: Home / Self Care | Payer: PPO | Attending: Emergency Medicine | Admitting: Emergency Medicine

## 2014-10-24 DIAGNOSIS — R079 Chest pain, unspecified: Secondary | ICD-10-CM

## 2014-10-24 DIAGNOSIS — R109 Unspecified abdominal pain: Secondary | ICD-10-CM

## 2014-10-24 DIAGNOSIS — R1032 Left lower quadrant pain: Secondary | ICD-10-CM

## 2014-10-24 LAB — COMPREHENSIVE METABOLIC PANEL
ALBUMIN: 3.8 g/dL (ref 3.5–5.0)
ALK PHOS: 105 U/L (ref 38–126)
ALT: 53 U/L (ref 17–63)
AST: 60 U/L — AB (ref 15–41)
Anion gap: 11 (ref 5–15)
BILIRUBIN TOTAL: 1.2 mg/dL (ref 0.3–1.2)
BUN: 21 mg/dL — AB (ref 6–20)
CALCIUM: 9.4 mg/dL (ref 8.9–10.3)
CO2: 26 mmol/L (ref 22–32)
Chloride: 100 mmol/L — ABNORMAL LOW (ref 101–111)
Creatinine, Ser: 1.33 mg/dL — ABNORMAL HIGH (ref 0.61–1.24)
GFR calc Af Amer: >60 mL/min (ref >60)
GFR calc non Af Amer: 52 mL/min — ABNORMAL LOW (ref >60)
GLUCOSE: 133 mg/dL — AB (ref 65–99)
POTASSIUM: 4.6 mmol/L (ref 3.5–5.1)
Sodium: 137 mmol/L (ref 135–145)
TOTAL PROTEIN: 6.9 g/dL (ref 6.5–8.1)

## 2014-10-24 LAB — URINALYSIS, ROUTINE W REFLEX MICROSCOPIC
BILIRUBIN URINE: NEGATIVE
Glucose, UA: NEGATIVE mg/dL
Hgb urine dipstick: NEGATIVE
Ketones, ur: 15 mg/dL — AB
Leukocytes, UA: NEGATIVE
NITRITE: NEGATIVE
PH: 5.5 (ref 5.0–8.0)
Protein, ur: NEGATIVE mg/dL
SPECIFIC GRAVITY, URINE: 1.029 (ref 1.005–1.030)
Urobilinogen, UA: 1 mg/dL (ref 0.0–1.0)

## 2014-10-24 LAB — LIPASE, BLOOD: Lipase: 36 U/L (ref 22–51)

## 2014-10-24 LAB — I-STAT TROPONIN, ED: Troponin i, poc: 0 ng/mL (ref 0.00–0.08)

## 2014-10-24 NOTE — Discharge Instructions (Signed)
We saw you in the ER for THE GROIN PAIN, FLANK PAIN AND INTERMITTENT CHEST PAIN. All the results in the ER are normal, labs and imaging. We are not sure what is causing your symptoms. The workup in the ER is not complete, and is limited to screening for life threatening and emergent conditions only, so please see a primary care doctor for further evaluation.  Please return to the ER if your symptoms worsen; you have increased pain, fevers, chills, inability to keep any medications down.  Please return to the ER if you have worsening chest pain, shortness of breath, pain radiating to your jaw, shoulder, or back, sweats or fainting. Otherwise see the Cardiologist or your primary care doctor as requested.   Chest Pain (Nonspecific) It is often hard to give a specific diagnosis for the cause of chest pain. There is always a chance that your pain could be related to something serious, such as a heart attack or a blood clot in the lungs. You need to follow up with your health care provider for further evaluation. CAUSES   Heartburn.  Pneumonia or bronchitis.  Anxiety or stress.  Inflammation around your heart (pericarditis) or lung (pleuritis or pleurisy).  A blood clot in the lung.  A collapsed lung (pneumothorax). It can develop suddenly on its own (spontaneous pneumothorax) or from trauma to the chest.  Shingles infection (herpes zoster virus). The chest wall is composed of bones, muscles, and cartilage. Any of these can be the source of the pain.  The bones can be bruised by injury.  The muscles or cartilage can be strained by coughing or overwork.  The cartilage can be affected by inflammation and become sore (costochondritis). DIAGNOSIS  Lab tests or other studies may be needed to find the cause of your pain. Your health care provider may have you take a test called an ambulatory electrocardiogram (ECG). An ECG records your heartbeat patterns over a 24-hour period. You may also  have other tests, such as:  Transthoracic echocardiogram (TTE). During echocardiography, sound waves are used to evaluate how blood flows through your heart.  Transesophageal echocardiogram (TEE).  Cardiac monitoring. This allows your health care provider to monitor your heart rate and rhythm in real time.  Holter monitor. This is a portable device that records your heartbeat and can help diagnose heart arrhythmias. It allows your health care provider to track your heart activity for several days, if needed.  Stress tests by exercise or by giving medicine that makes the heart beat faster. TREATMENT   Treatment depends on what may be causing your chest pain. Treatment may include:  Acid blockers for heartburn.  Anti-inflammatory medicine.  Pain medicine for inflammatory conditions.  Antibiotics if an infection is present.  You may be advised to change lifestyle habits. This includes stopping smoking and avoiding alcohol, caffeine, and chocolate.  You may be advised to keep your head raised (elevated) when sleeping. This reduces the chance of acid going backward from your stomach into your esophagus. Most of the time, nonspecific chest pain will improve within 2-3 days with rest and mild pain medicine.  HOME CARE INSTRUCTIONS   If antibiotics were prescribed, take them as directed. Finish them even if you start to feel better.  For the next few days, avoid physical activities that bring on chest pain. Continue physical activities as directed.  Do not use any tobacco products, including cigarettes, chewing tobacco, or electronic cigarettes.  Avoid drinking alcohol.  Only take medicine as directed  by your health care provider.  Follow your health care provider's suggestions for further testing if your chest pain does not go away.  Keep any follow-up appointments you made. If you do not go to an appointment, you could develop lasting (chronic) problems with pain. If there is any  problem keeping an appointment, call to reschedule. SEEK MEDICAL CARE IF:   Your chest pain does not go away, even after treatment.  You have a rash with blisters on your chest.  You have a fever. SEEK IMMEDIATE MEDICAL CARE IF:   You have increased chest pain or pain that spreads to your arm, neck, jaw, back, or abdomen.  You have shortness of breath.  You have an increasing cough, or you cough up blood.  You have severe back or abdominal pain.  You feel nauseous or vomit.  You have severe weakness.  You faint.  You have chills. This is an emergency. Do not wait to see if the pain will go away. Get medical help at once. Call your local emergency services (911 in [place] Do not drive yourself to the hospital. MAKE SURE YOU:   Understand these instructions.  Will watch your condition.  Will get help right away if you are not doing well or get worse. Document Released: 11/09/2004 Document Revised: 02/04/2013 Document Reviewed: 09/05/2007 [facility] Patient Information 2015 [place], [place]. This information is not intended to replace advice given to you by your health care provider. Make sure you discuss any questions you have with your health care provider.  Flank Pain Flank pain refers to pain that is located on the side of the body between the upper abdomen and the back. The pain may occur over a short period of time (acute) or may be long-term or reoccurring (chronic). It may be mild or severe. Flank pain can be caused by many things. CAUSES  Some of the more common causes of flank pain include:  Muscle strains.   Muscle spasms.   A disease of your spine (vertebral disk disease).   A lung infection (pneumonia).   Fluid around your lungs (pulmonary edema).   A kidney infection.   Kidney stones.   A very painful skin rash caused by the chickenpox virus (shingles).   Gallbladder disease.  [facility] care will depend on the cause of your  pain. In general,  Rest as directed by your caregiver.  Drink enough fluids to keep your urine clear or pale yellow.  Only take over-the-counter or prescription medicines as directed by your caregiver. Some medicines may help relieve the pain.  Tell your caregiver about any changes in your pain.  Follow up with your caregiver as directed. SEEK IMMEDIATE MEDICAL CARE IF:   Your pain is not controlled with medicine.   You have new or worsening symptoms.  Your pain increases.   You have abdominal pain.   You have shortness of breath.   You have persistent nausea or vomiting.   You have swelling in your abdomen.   You feel faint or pass out.   You have blood in your urine.  You have a fever or persistent symptoms for more than 2-3 days.  You have a fever and your symptoms suddenly get worse. MAKE SURE YOU:   Understand these instructions.  Will watch your condition.  Will get help right away if you are not doing well or get worse. Document Released: 03/23/2005 Document Revised: 10/25/2011 Document Reviewed: 09/14/2011 [facility] Patient Information 2015 [place], [place].  This information is not intended to replace advice given to you by your health care provider. Make sure you discuss any questions you have with your health care provider.  Abdominal Pain Many things can cause abdominal pain. Usually, abdominal pain is not caused by a disease and will improve without treatment. It can often be observed and treated at home. Your health care provider will do a physical exam and possibly order blood tests and X-rays to help determine the seriousness of your pain. However, in many cases, more time must pass before a clear cause of the pain can be found. Before that point, your health care provider may not know if you need more testing or further treatment. HOME CARE INSTRUCTIONS  Monitor your abdominal pain for any changes. The following actions may help to alleviate any  discomfort you are experiencing:  Only take over-the-counter or prescription medicines as directed by your health care provider.  Do not take laxatives unless directed to do so by your health care provider.  Try a clear liquid diet (broth, tea, or water) as directed by your health care provider. Slowly move to a bland diet as tolerated. SEEK MEDICAL CARE IF:  You have unexplained abdominal pain.  You have abdominal pain associated with nausea or diarrhea.  You have pain when you urinate or have a bowel movement.  You experience abdominal pain that wakes you in the night.  You have abdominal pain that is worsened or improved by eating food.  You have abdominal pain that is worsened with eating fatty foods.  You have a fever. SEEK IMMEDIATE MEDICAL CARE IF:   Your pain does not go away within 2 hours.  You keep throwing up (vomiting).  Your pain is felt only in portions of the abdomen, such as the right side or the left lower portion of the abdomen.  You pass bloody or black tarry stools. MAKE SURE YOU:  Understand these instructions.   Will watch your condition.   Will get help right away if you are not doing well or get worse.  Document Released: 11/09/2004 Document Revised: 02/04/2013 Document Reviewed: 10/09/2012 [facility] Patient Information 2015 [place], [place]. This information is not intended to replace advice given to you by your health care provider. Make sure you discuss any questions you have with your health care provider.

## 2014-10-24 NOTE — ED Provider Notes (Signed)
CSN: [id number]     Arrival date & time 10/23/14  2250 History  This chart was scribed for Varney Biles, MD by Randa Evens, ED Scribe. This patient was seen in room A11C/A11C and the patient's care was started at 12:55 AM.    Chief Complaint  Patient presents with  . Flank Pain  . Dysuria   The history is provided by the patient. No language interpreter was used.   HPI Comments: Terry Lyons is a 69 y.o. male who presents to the Emergency Department complaining of new burning discomfort around his mid scrotum onset 2 hours PTA. Pt states he first noticed the pain after urinating tonight at 9PM. Pt reports associated resolved left side flank pain and back pain. Pt states that the pain resolved after receiving fentanyl in the ED. Pt denies nausea, fever or chills.   Pt has a Hx of UTI. No tobacco use since 1975.   Pt does reports intermittent sharp left sided CP for 3 weeks. Pt states that the pain returns every 4-5 days. He states that the pain last for about 10 minutes each time. Pt states that the pain resolves after taking aspirin. Pt reports worsening SOB with exertion. Normal stress test 1 year prior.     Past Medical History  Diagnosis Date  . GERD (gastroesophageal reflux disease)   . Gout   . History of basal cell carcinoma     W-S Derm  . History of squamous cell carcinoma   . Hyperlipidemia   . Tachycardia   . Hypertension   . Allergy   . Rosacea   . Diabetes mellitus   . Arthritis   . Obesity    Past Surgical History  Procedure Laterality Date  . Tonsillectomy    . Pilonidal cyst excision    . Knee arthroscopy      R  . Colonoscopy with polypectomy      X3  . Esophagus dilated      X3  . Shoulder surgery      right shoulder  . Total knee arthroplasty      RIGHT  . Knee arthroscopy      bilaterally  . Total ethmoidectomy & sphenoidectomiy Bilateral 05/27/13    also reduction of turbinates   Family History  Problem Relation Age of Onset  . Lung cancer  Mother     smoker  . Heart attack Brother 9    CBAG  . Lung cancer Maternal Aunt     smoker  . Heart attack Paternal Grandmother 80  . Heart disease Father     Rheumatic Heart Disease  . Heart attack Paternal Grandfather 93  . Diabetes Neg Hx   . Stroke Neg Hx   . Colon cancer Neg Hx   . Pancreatic cancer Neg Hx   . Rectal cancer Neg Hx   . Stomach cancer Neg Hx    Social History  Substance Use Topics  . Smoking status: Former Smoker    Types: Cigarettes    Quit date: 10/17/1973  . Smokeless tobacco: Never Used  . Alcohol Use: No    Review of Systems  Constitutional: Negative for fever and chills.  Respiratory: Positive for shortness of breath.   Cardiovascular: Positive for chest pain.  Gastrointestinal: Negative for nausea.  Genitourinary: Positive for flank pain.       Burning scrotal pain.     ROS 10 Systems reviewed and are negative for acute change except as noted in the HPI.  Allergies  Bupropion hcl; Codeine; Escitalopram oxalate; Sertraline hcl; Sulfa antibiotics; and Sulfur  Home Medications   Prior to Admission medications   Medication Sig Start Date End Date Taking? Authorizing Provider  allopurinol (ZYLOPRIM) 300 MG tablet Take 150-300 mg by mouth 2 (two) times daily. 1 tablet (300 mg) in the morning and 0.5 tablets (150 mg) at night   Yes Historical Provider, MD  aspirin EC 81 MG tablet Take 81 mg by mouth daily.   Yes Historical Provider, MD  Calcium Carbonate (CALCIUM 600) 1500 MG TABS Take 1,500 mg by mouth daily.    Yes Historical Provider, MD  cetirizine (ZYRTEC) 10 MG tablet Take 10 mg by mouth daily.   Yes Historical Provider, MD  Cholecalciferol (VITAMIN D PO) Take 1 tablet by mouth daily.   Yes Historical Provider, MD  Coenzyme Q10 (CO Q 10 PO) Take 1 capsule by mouth daily.    Yes Historical Provider, MD  doxazosin (CARDURA) 4 MG tablet Take 1 tablet (4 mg total) by mouth at bedtime. Patient taking differently: Take 2 mg by mouth at  bedtime.  08/24/11  Yes Hendricks Limes, MD  levothyroxine (SYNTHROID, LEVOTHROID) 50 MCG tablet Take 50 mcg by mouth daily before breakfast.   Yes Historical Provider, MD  meloxicam (MOBIC) 7.5 MG tablet Take 1 tablet (7.5 mg total) by mouth daily. 05/27/12  Yes Hendricks Limes, MD  metFORMIN (GLUCOPHAGE) 500 MG tablet Take 1 tablet (500 mg total) by mouth daily with breakfast. 1 by mouth daily with breakfast, LABS DUE Patient taking differently: Take 500 mg by mouth daily with breakfast. LABS DUE 05/27/12  Yes Hendricks Limes, MD  metoprolol tartrate (LOPRESSOR) 25 MG tablet Take 12.5 mg by mouth 2 (two) times daily.   Yes Historical Provider, MD  Multiple Vitamin (MULTIVITAMIN WITH MINERALS) TABS tablet Take 1 tablet by mouth daily.   Yes Historical Provider, MD  Omega-3 Fatty Acids (FISH OIL) 1000 MG CAPS Take 1 capsule by mouth daily.    Yes Historical Provider, MD  omeprazole (PRILOSEC) 20 MG capsule Take 20 mg by mouth daily.    Yes Historical Provider, MD  pseudoephedrine-guaifenesin (MUCINEX D) 60-600 MG per tablet Take 1 tablet by mouth daily.    Yes Historical Provider, MD  Red Yeast Rice Extract (RED YEAST RICE PO) Take 1 capsule by mouth daily.   Yes Historical Provider, MD  Saw Palmetto, Serenoa repens, (SAW PALMETTO PO) Take 1 capsule by mouth daily.   Yes Historical Provider, MD  spironolactone (ALDACTONE) 25 MG tablet Take 1 tablet (25 mg total) by mouth daily. 05/22/13  Yes Hendricks Limes, MD  traMADol (ULTRAM) 50 MG tablet Take 1 tablet (50 mg total) by mouth every 6 (six) hours as needed. Patient not taking: Reported on 10/24/2014 01/25/14   Varney Biles, MD   BP 139/70 mmHg  Pulse 78  Temp(Src) 97.7 F (36.5 C) (Oral)  Resp 17  SpO2 93%   Physical Exam  Constitutional: He appears well-developed and well-nourished.  HENT:  Head: Normocephalic and atraumatic.  Eyes: Conjunctivae are normal. Right eye exhibits no discharge. Left eye exhibits no discharge.   Cardiovascular:  Murmur heard.  Systolic murmur is present  Pulmonary/Chest: Effort normal. No respiratory distress.  Anterior lung exam clear.   Abdominal: Soft. There is no tenderness.  Genitourinary:  Bilateral testes free moving, no ingunial hernias.   Musculoskeletal:  No flank tenderness. No pitting edema.   Neurological: He is alert. Coordination normal.  Skin: Skin  is warm and dry. No rash noted. He is not diaphoretic. No erythema.  Psychiatric: He has a normal mood and affect.  Nursing note and vitals reviewed.   ED Course  Procedures (including critical care time) DIAGNOSTIC STUDIES: Oxygen Saturation is 94% on RA, adequate by my interpretation.    COORDINATION OF CARE: 1:58 AM-Discussed treatment plan with pt at bedside and pt agreed to plan.     Labs Review Labs Reviewed  COMPREHENSIVE METABOLIC PANEL - Abnormal; Notable for the following:    Chloride 100 (*)    Glucose, Bld 133 (*)    BUN 21 (*)    Creatinine, Ser 1.33 (*)    AST 60 (*)    GFR calc non Af Amer 52 (*)    All other components within normal limits  URINALYSIS, ROUTINE W REFLEX MICROSCOPIC (NOT AT [facility]) - Abnormal; Notable for the following:    APPearance CLOUDY (*)    Ketones, ur 15 (*)    All other components within normal limits  LIPASE, BLOOD  CBC  I-STAT TROPOININ, ED    Imaging Review No results found.    EKG Interpretation   Date/Time:  Saturday October 24 2014 96:75:91 EDT Ventricular Rate:  82 PR Interval:  185 QRS Duration: 96 QT Interval:  387 QTC Calculation: 452 R Axis:   71 Text Interpretation:  Sinus rhythm Low voltage, precordial leads No  significant change since last tracing Confirmed by Tayvion Lauder, MD, Thelma Comp  ([id number]) on 10/24/2014 2:36:40 AM      MDM   Final diagnoses:  Flank pain, acute  Groin pain, left  Intermittent chest pain     I personally performed the services described in this documentation, which was scribed in my presence. The recorded  information has been reviewed and is accurate.  Pt comes in with testicular pain and L flank pain. Possible hx of renal stones in the past. His pain resolved after he got fentanyl in the ER and he has been pain free more > 1 hour. His exam reveals no focal tenderness, no hernia, and the testes are free moving. UA is clean. No indication for imaging.  Pt also has intermittent chest pain. Has hx of stress last year, and was normal. Does have cardiac risk factors. He is supposed to go back for f/u with Cards. Screening ekg is normal. Trop is neg. Discussed strict return precautions and advised close f/u with Cards.     Varney Biles, MD 10/24/14 [phone]

## 2014-10-24 NOTE — ED Notes (Signed)
Discharge instructions reviewed with patient/spouse. Understanding verbalized. Patient declined wheelchair at time of discharge. Denies pain. No distress noted.

## 2015-01-28 ENCOUNTER — Encounter: Payer: Self-pay | Admitting: Cardiology

## 2015-01-28 ENCOUNTER — Ambulatory Visit (INDEPENDENT_AMBULATORY_CARE_PROVIDER_SITE_OTHER): Payer: PPO | Admitting: Cardiology

## 2015-01-28 VITALS — BP 128/80 | HR 73 | Ht 69.0 in | Wt 309.0 lb

## 2015-01-28 DIAGNOSIS — E785 Hyperlipidemia, unspecified: Secondary | ICD-10-CM | POA: Diagnosis not present

## 2015-01-28 DIAGNOSIS — E8881 Metabolic syndrome: Secondary | ICD-10-CM | POA: Diagnosis not present

## 2015-01-28 DIAGNOSIS — I10 Essential (primary) hypertension: Secondary | ICD-10-CM

## 2015-01-28 DIAGNOSIS — R0609 Other forms of dyspnea: Secondary | ICD-10-CM | POA: Diagnosis not present

## 2015-01-28 DIAGNOSIS — R0600 Dyspnea, unspecified: Secondary | ICD-10-CM

## 2015-01-28 MED ORDER — FUROSEMIDE 40 MG PO TABS: 40.0000 mg | ORAL_TABLET | Freq: Every day | ORAL | Status: DC | PRN

## 2015-01-28 NOTE — Patient Instructions (Addendum)
Your physician has recommended you make the following change in your medication:   DR NELSON HAS PRESCRIBED FOR YOU TO TAKE LASIX 40 MG ONCE DAILY ONLY AS NEEDED FOR LOWER EXTREMITY EDEMA     Your physician wants you to follow-up in: [place] [facility] [place] will receive a reminder letter in the mail two months in advance. If you don't receive a letter, please call our office to schedule the follow-up appointment.

## 2015-01-28 NOTE — Progress Notes (Signed)
Patient ID: MEHKI CEFARATTI, male   DOB: 26-Feb-1945, 69 y.o.   MRN: [id number]     Patient Name: Terry Lyons Date of Encounter: 01/28/2015  Primary Care Provider:  [name], MD Primary Cardiologist: Dorothy Spark  Problem List   Past Medical History  Diagnosis Date  . GERD (gastroesophageal reflux disease)   . Gout   . History of basal cell carcinoma     W-S Derm  . History of squamous cell carcinoma   . Hyperlipidemia   . Tachycardia   . Hypertension   . Allergy   . Rosacea   . Diabetes mellitus   . Arthritis   . Obesity    Past Surgical History  Procedure Laterality Date  . Tonsillectomy    . Pilonidal cyst excision    . Knee arthroscopy      R  . Colonoscopy with polypectomy      X3  . Esophagus dilated      X3  . Shoulder surgery      right shoulder  . Total knee arthroplasty      RIGHT  . Knee arthroscopy      bilaterally  . Total ethmoidectomy & sphenoidectomiy Bilateral 05/27/13    also reduction of turbinates    Allergies  Allergies  Allergen Reactions  . Bupropion Hcl     REACTION: insomnia  . Codeine     REACTION: nausea  . Escitalopram Oxalate     REACTION: insomnia  . Sertraline Hcl     REACTION: insomnia  . Sulfa Antibiotics     Pt can not remember  . Sulfur Other (See Comments)   Chief complain: DOE  HPI  69 year old male with metabolic syndrome that includes hyper triglyceridemia, obesity, type II non-insulin-dependent diabetes, physical inactivity who is coming with concern of worsening dyspnea on exertion. He states that he completely decrease his physical activity and right now he is walking minimally he attributes it that it started with knee replacement however, walking to shower would make him short of breath. He denies any chest pain, palpitations or syncope. He has chronic lower extremity edema that is nonpitting and has been there for 3-4 years. On he had a nuclear stress test in the past that was negative. It has been over  7 years doesn't remember exactly when. He also had an echocardiogram in 2010 that was nonconclusive with possible inferior wall hypokinesis. The patient has a very significant family history of coronary artery disease. His brother died of myocardial infarction 42, 2 of his grandmothers both died in their 8s of myocardial infarction. His father died of complications of rheumatic heart disease at age of 21. He knows his high triglycerides and was prescribed Lipitor but is reluctant because he is worrying about side effects with worsening diabetes.  01/28/15 - 1 year follow up, the patient denies chest pain but has stable DOE with ild to moderate exertion. He is minimally active and doesn't exercise. He is not compliant to low sodium diet and complains of LE edema, that improved with compression stockings and overnight but is chronically present. Denies palpitations, orthopnea, PND.  Home Medications  Prior to Admission medications   Medication Sig Start Date End Date Taking? Authorizing Provider  allopurinol (ZYLOPRIM) 300 MG tablet Take 150-300 mg by mouth 2 (two) times daily. 1 tablet (300 mg) in the morning and 0.5 tablets (150 mg) at night   Yes Historical Provider, MD  aspirin EC 81 MG tablet Take 81 mg  by mouth daily.   Yes Historical Provider, MD  Calcium Carbonate (CALCIUM 600) 1500 MG TABS Take 1,500 mg by mouth daily.    Yes Historical Provider, MD  Cholecalciferol (VITAMIN D PO) Take 1 tablet by mouth daily.   Yes Historical Provider, MD  Coenzyme Q10 (CO Q 10 PO) Take 1 capsule by mouth daily.    Yes Historical Provider, MD  diphenhydramine-acetaminophen (TYLENOL PM) 25-500 MG TABS Take 1 tablet by mouth at bedtime as needed (sleep).    Yes Historical Provider, MD  doxazosin (CARDURA) 4 MG tablet Take 1 tablet (4 mg total) by mouth at bedtime. 08/24/11  Yes Hendricks Limes, MD  meloxicam (MOBIC) 7.5 MG tablet Take 1 tablet (7.5 mg total) by mouth daily. 05/27/12  Yes Hendricks Limes, MD    metFORMIN (GLUCOPHAGE) 500 MG tablet Take 1 tablet (500 mg total) by mouth daily with breakfast. 1 by mouth daily with breakfast, LABS DUE Patient taking differently: Take 500 mg by mouth daily with breakfast. LABS DUE 05/27/12  Yes Hendricks Limes, MD  metoprolol tartrate (LOPRESSOR) 25 MG tablet Take 12.5 mg by mouth 2 (two) times daily.   Yes Historical Provider, MD  Multiple Vitamin (MULTIVITAMIN PO) Take 1 capsule by mouth daily.    Yes Historical Provider, MD  mupirocin cream (BACTROBAN) 2 % Apply 1 application topically daily.   Yes Historical Provider, MD  Omega-3 Fatty Acids (FISH OIL) 1000 MG CAPS Take 1 capsule by mouth daily.    Yes Historical Provider, MD  omeprazole (PRILOSEC) 20 MG capsule Take 20-40 mg by mouth daily.    Yes Historical Provider, MD  pseudoephedrine-acetaminophen (TYLENOL SINUS) 30-500 MG TABS Take 2 tablets by mouth every 4 (four) hours as needed (headache).   Yes Historical Provider, MD  pseudoephedrine-guaifenesin (MUCINEX D) 60-600 MG per tablet Take 1 tablet by mouth daily.    Yes Historical Provider, MD  spironolactone (ALDACTONE) 25 MG tablet Take 1 tablet (25 mg total) by mouth daily. 05/22/13  Yes Hendricks Limes, MD    Family History  Family History  Problem Relation Age of Onset  . Lung cancer Mother     smoker  . Heart attack Brother 44    CBAG  . Lung cancer Maternal Aunt     smoker  . Heart attack Paternal Grandmother 31  . Heart disease Father     Rheumatic Heart Disease  . Heart attack Paternal Grandfather 41  . Diabetes Neg Hx   . Stroke Neg Hx   . Colon cancer Neg Hx   . Pancreatic cancer Neg Hx   . Rectal cancer Neg Hx   . Stomach cancer Neg Hx     Social History  Social History   Social History  . Marital Status: Married    Spouse Name: N/A  . Number of Children: N/A  . Years of Education: N/A   Occupational History  . Not on file.   Social History Main Topics  . Smoking status: Former Smoker    Types: Cigarettes     Quit date: 10/17/1973  . Smokeless tobacco: Never Used  . Alcohol Use: No  . Drug Use: No  . Sexual Activity: Not on file   Other Topics Concern  . Not on file   Social History Narrative     Review of Systems, as per HPI, otherwise negative General:  No chills, fever, night sweats or weight changes.  Cardiovascular:  No chest pain, dyspnea on exertion, edema, orthopnea, palpitations,  paroxysmal nocturnal dyspnea. Dermatological: No rash, lesions/masses Respiratory: No cough, dyspnea Urologic: No hematuria, dysuria Abdominal:   No nausea, vomiting, diarrhea, bright red blood per rectum, melena, or hematemesis Neurologic:  No visual changes, wkns, changes in mental status. All other systems reviewed and are otherwise negative except as noted above.  Physical Exam  Blood pressure 128/80, pulse 73, height 5\' 9"  (1.753 m), weight 309 lb (140.161 kg).  General: Pleasant, NAD Psych: Normal affect. Neuro: Alert and oriented X 3. Moves all extremities spontaneously. HEENT: Normal  Neck: Supple without bruits or JVD. Lungs:  Resp regular and unlabored, CTA. Heart: RRR no s3, s4, or murmurs. Abdomen: Soft, non-tender, non-distended, BS + x 4.  Extremities: No clubbing, cyanosis, mild non-pitting edema. DP/PT/Radials 2+ and equal bilaterally.  Labs:  No results for input(s): CKTOTAL, CKMB, TROPONINI in the last 72 hours. Lab Results  Component Value Date   WBC 9.4 10/23/2014   HGB 15.1 10/23/2014   HCT 43.9 10/23/2014   MCV 93.0 10/23/2014   PLT 231 10/23/2014    Lab Results  Component Value Date   DDIMER * 06/09/2008    1.34        AT THE INHOUSE ESTABLISHED CUTOFF VALUE OF 0.48 ug/mL FEU, THIS ASSAY HAS BEEN DOCUMENTED IN THE LITERATURE TO HAVE A SENSITIVITY AND NEGATIVE PREDICTIVE VALUE OF AT LEAST 98 TO 99%.  THE TEST RESULT SHOULD BE CORRELATED WITH AN ASSESSMENT OF THE CLINICAL PROBABILITY OF DVT / VTE.   Invalid input(s): POCBNP    Component Value Date/Time    NA 137 10/23/2014 2310   K 4.6 10/23/2014 2310   CL 100* 10/23/2014 2310   CO2 26 10/23/2014 2310   GLUCOSE 133* 10/23/2014 2310   BUN 21* 10/23/2014 2310   CREATININE 1.33* 10/23/2014 2310   CALCIUM 9.4 10/23/2014 2310   PROT 6.9 10/23/2014 2310   ALBUMIN 3.8 10/23/2014 2310   AST 60* 10/23/2014 2310   ALT 53 10/23/2014 2310   ALKPHOS 105 10/23/2014 2310   BILITOT 1.2 10/23/2014 2310   GFRNONAA 52* 10/23/2014 2310   GFRAA >60 10/23/2014 2310   Lab Results  Component Value Date   CHOL 188 12/21/2010   HDL 45.30 12/21/2010   TRIG 252.0* 12/21/2010    Accessory Clinical Findings  Echocardiogram - 2010  SUMMARY - EF difficult to assess due to poor images. Possible inferobasal    hypokinesis Left ventricular wall thickness was mildly    increased. - The aortic valve was mildly calcified. - Posterior annular calcification  ECG - NSR, normal ECG  Lexiscan nuclear stress test 01/09/14 Impression Exercise Capacity: Lexiscan with no exercise. BP Response: Normal blood pressure response. Clinical Symptoms: Dyspnea.  ECG Impression: No significant ST segment change suggestive of ischemia. Comparison with Prior Nuclear Study: No images to compare  Overall Impression: Low risk stress nuclear study with a fixed, small, mild apical lateral and apical perfusion defect. Possible attenuation but cannot rule out small infarction. No ischemia. .  LV Ejection Fraction: 58%. LV Wall Motion: NL LV Function; NL Wall Motion     Assessment & Plan  69 year old male  1. Dyspnea on exertion - the patient has all the risk factor including that obesity, hypertension, untreated hyperlipidemia, obesity, physical inactivity, significant family history of premature coronary artery disease, prior history of smoking and he has also been exposed to agent orange. Lexiscan nuclear stress test was negative for scar or ischemia. We will focus on aggressive risk factor  modification predominantly lipids management. He  is deconditioned, definitely needs to start exercising and loosing weight.  No ischemic work up needed right now.  2. Hypertension - controlled  3. Hyperlipidemia - patient is strongly advised to start taking Lipitor, he is strongly opinionated again states. He's advised to start rabies TIAs in addition he will be taking fish oil and wants to try to niacin to his 3 is taking.   4. Metabolic syndrome X, obesity - patient advised on proper exercise level and intensity he seems to be motivated and wanting to join [employer] sneakers.  Follow-up in 1 year.   Dorothy Spark, MD, [facility] 01/28/2015, 11:58 AM

## 2015-03-02 DIAGNOSIS — Z6841 Body Mass Index (BMI) 40.0 and over, adult: Secondary | ICD-10-CM | POA: Diagnosis not present

## 2015-03-02 DIAGNOSIS — R946 Abnormal results of thyroid function studies: Secondary | ICD-10-CM | POA: Diagnosis not present

## 2015-03-02 DIAGNOSIS — I10 Essential (primary) hypertension: Secondary | ICD-10-CM | POA: Diagnosis not present

## 2015-03-02 DIAGNOSIS — E119 Type 2 diabetes mellitus without complications: Secondary | ICD-10-CM | POA: Diagnosis not present

## 2015-03-02 DIAGNOSIS — E038 Other specified hypothyroidism: Secondary | ICD-10-CM | POA: Diagnosis not present

## 2015-03-02 DIAGNOSIS — E784 Other hyperlipidemia: Secondary | ICD-10-CM | POA: Diagnosis not present

## 2015-03-06 ENCOUNTER — Emergency Department (HOSPITAL_COMMUNITY): Payer: Non-veteran care

## 2015-03-06 ENCOUNTER — Encounter (HOSPITAL_COMMUNITY): Payer: Self-pay | Admitting: Emergency Medicine

## 2015-03-06 ENCOUNTER — Inpatient Hospital Stay (HOSPITAL_COMMUNITY)
Admission: EM | Admit: 2015-03-06 | Discharge: 2015-03-13 | DRG: 368 | Disposition: A | Discharge status: Home Health | Payer: Non-veteran care | Attending: Internal Medicine | Admitting: Internal Medicine

## 2015-03-06 DIAGNOSIS — R079 Chest pain, unspecified: Secondary | ICD-10-CM | POA: Diagnosis not present

## 2015-03-06 DIAGNOSIS — D649 Anemia, unspecified: Secondary | ICD-10-CM | POA: Diagnosis not present

## 2015-03-06 DIAGNOSIS — B3781 Candidal esophagitis: Secondary | ICD-10-CM | POA: Diagnosis not present

## 2015-03-06 DIAGNOSIS — Z87891 Personal history of nicotine dependence: Secondary | ICD-10-CM

## 2015-03-06 DIAGNOSIS — R1013 Epigastric pain: Secondary | ICD-10-CM | POA: Diagnosis not present

## 2015-03-06 DIAGNOSIS — J9811 Atelectasis: Secondary | ICD-10-CM | POA: Diagnosis not present

## 2015-03-06 DIAGNOSIS — K2970 Gastritis, unspecified, without bleeding: Secondary | ICD-10-CM | POA: Diagnosis present

## 2015-03-06 DIAGNOSIS — N179 Acute kidney failure, unspecified: Secondary | ICD-10-CM | POA: Diagnosis not present

## 2015-03-06 DIAGNOSIS — E038 Other specified hypothyroidism: Secondary | ICD-10-CM | POA: Diagnosis not present

## 2015-03-06 DIAGNOSIS — Z8249 Family history of ischemic heart disease and other diseases of the circulatory system: Secondary | ICD-10-CM

## 2015-03-06 DIAGNOSIS — K210 Gastro-esophageal reflux disease with esophagitis: Secondary | ICD-10-CM | POA: Diagnosis present

## 2015-03-06 DIAGNOSIS — Z791 Long term (current) use of non-steroidal anti-inflammatories (NSAID): Secondary | ICD-10-CM

## 2015-03-06 DIAGNOSIS — R1011 Right upper quadrant pain: Secondary | ICD-10-CM | POA: Diagnosis not present

## 2015-03-06 DIAGNOSIS — E1165 Type 2 diabetes mellitus with hyperglycemia: Secondary | ICD-10-CM | POA: Diagnosis not present

## 2015-03-06 DIAGNOSIS — Z794 Long term (current) use of insulin: Secondary | ICD-10-CM | POA: Diagnosis not present

## 2015-03-06 DIAGNOSIS — R071 Chest pain on breathing: Secondary | ICD-10-CM | POA: Diagnosis present

## 2015-03-06 DIAGNOSIS — R112 Nausea with vomiting, unspecified: Secondary | ICD-10-CM | POA: Diagnosis not present

## 2015-03-06 DIAGNOSIS — M109 Gout, unspecified: Secondary | ICD-10-CM | POA: Diagnosis not present

## 2015-03-06 DIAGNOSIS — Z7982 Long term (current) use of aspirin: Secondary | ICD-10-CM

## 2015-03-06 DIAGNOSIS — R109 Unspecified abdominal pain: Secondary | ICD-10-CM

## 2015-03-06 DIAGNOSIS — R1110 Vomiting, unspecified: Secondary | ICD-10-CM | POA: Diagnosis not present

## 2015-03-06 DIAGNOSIS — G43A1 Cyclical vomiting, intractable: Secondary | ICD-10-CM | POA: Diagnosis not present

## 2015-03-06 DIAGNOSIS — I5031 Acute diastolic (congestive) heart failure: Secondary | ICD-10-CM | POA: Diagnosis not present

## 2015-03-06 DIAGNOSIS — K5900 Constipation, unspecified: Secondary | ICD-10-CM | POA: Diagnosis not present

## 2015-03-06 DIAGNOSIS — K2980 Duodenitis without bleeding: Secondary | ICD-10-CM | POA: Diagnosis not present

## 2015-03-06 DIAGNOSIS — R000 Tachycardia, unspecified: Secondary | ICD-10-CM | POA: Diagnosis present

## 2015-03-06 DIAGNOSIS — E039 Hypothyroidism, unspecified: Secondary | ICD-10-CM | POA: Diagnosis present

## 2015-03-06 DIAGNOSIS — T508X5A Adverse effect of diagnostic agents, initial encounter: Secondary | ICD-10-CM | POA: Diagnosis not present

## 2015-03-06 DIAGNOSIS — J181 Lobar pneumonia, unspecified organism: Secondary | ICD-10-CM | POA: Diagnosis not present

## 2015-03-06 DIAGNOSIS — E8770 Fluid overload, unspecified: Secondary | ICD-10-CM | POA: Diagnosis present

## 2015-03-06 DIAGNOSIS — K567 Ileus, unspecified: Secondary | ICD-10-CM | POA: Diagnosis not present

## 2015-03-06 DIAGNOSIS — R0902 Hypoxemia: Secondary | ICD-10-CM

## 2015-03-06 DIAGNOSIS — I10 Essential (primary) hypertension: Secondary | ICD-10-CM | POA: Diagnosis present

## 2015-03-06 DIAGNOSIS — E860 Dehydration: Secondary | ICD-10-CM | POA: Diagnosis present

## 2015-03-06 DIAGNOSIS — Z6841 Body Mass Index (BMI) 40.0 and over, adult: Secondary | ICD-10-CM | POA: Diagnosis not present

## 2015-03-06 DIAGNOSIS — IMO0001 Reserved for inherently not codable concepts without codable children: Secondary | ICD-10-CM | POA: Diagnosis present

## 2015-03-06 DIAGNOSIS — R933 Abnormal findings on diagnostic imaging of other parts of digestive tract: Secondary | ICD-10-CM | POA: Diagnosis not present

## 2015-03-06 DIAGNOSIS — R1084 Generalized abdominal pain: Secondary | ICD-10-CM | POA: Diagnosis not present

## 2015-03-06 LAB — CBC
HEMATOCRIT: 42.3 % (ref 39.0–52.0)
HEMOGLOBIN: 14.2 g/dL (ref 13.0–17.0)
MCH: 31.4 pg (ref 26.0–34.0)
MCHC: 33.6 g/dL (ref 30.0–36.0)
MCV: 93.6 fL (ref 78.0–100.0)
Platelets: 218 10*3/uL (ref 150–400)
RBC: 4.52 MIL/uL (ref 4.22–5.81)
RDW: 14.8 % (ref 11.5–15.5)
WBC: 6.7 10*3/uL (ref 4.0–10.5)

## 2015-03-06 LAB — HEPATIC FUNCTION PANEL
ALK PHOS: 83 U/L (ref 38–126)
ALT: 36 U/L (ref 17–63)
AST: 49 U/L — ABNORMAL HIGH (ref 15–41)
Albumin: 3.6 g/dL (ref 3.5–5.0)
BILIRUBIN INDIRECT: 1 mg/dL — AB (ref 0.3–0.9)
BILIRUBIN TOTAL: 1.4 mg/dL — AB (ref 0.3–1.2)
Bilirubin, Direct: 0.4 mg/dL (ref 0.1–0.5)
Total Protein: 6.8 g/dL (ref 6.5–8.1)

## 2015-03-06 LAB — BASIC METABOLIC PANEL
ANION GAP: 11 (ref 5–15)
BUN: 17 mg/dL (ref 6–20)
CO2: 22 mmol/L (ref 22–32)
Calcium: 9.3 mg/dL (ref 8.9–10.3)
Chloride: 102 mmol/L (ref 101–111)
Creatinine, Ser: 1.04 mg/dL (ref 0.61–1.24)
GFR calc Af Amer: >60 mL/min (ref >60)
GFR calc non Af Amer: >60 mL/min (ref >60)
GLUCOSE: 214 mg/dL — AB (ref 65–99)
POTASSIUM: 4.8 mmol/L (ref 3.5–5.1)
Sodium: 135 mmol/L (ref 135–145)

## 2015-03-06 LAB — I-STAT TROPONIN, ED
TROPONIN I, POC: 0 ng/mL (ref 0.00–0.08)
Troponin i, poc: 0 ng/mL (ref 0.00–0.08)

## 2015-03-06 LAB — LIPASE, BLOOD: Lipase: 33 U/L (ref 11–51)

## 2015-03-06 MED ORDER — PANTOPRAZOLE SODIUM 40 MG PO TBEC
40.0000 mg | DELAYED_RELEASE_TABLET | Freq: Every day | ORAL | Status: DC
  Filled 2015-03-06: qty 1

## 2015-03-06 MED ORDER — PROMETHAZINE HCL 25 MG/ML IJ SOLN
12.5000 mg | Freq: Four times a day (QID) | INTRAMUSCULAR | Status: DC | PRN
  Administered 2015-03-06 – 2015-03-07 (×2): 12.5 mg via INTRAVENOUS
  Filled 2015-03-06 (×2): qty 1

## 2015-03-06 MED ORDER — LIDOCAINE VISCOUS 2 % MT SOLN
15.0000 mL | Freq: Once | OROMUCOSAL | Status: AC
  Administered 2015-03-06: 15 mL via OROMUCOSAL
  Filled 2015-03-06: qty 15

## 2015-03-06 MED ORDER — OMEGA-3-ACID ETHYL ESTERS 1 G PO CAPS
1.0000 g | ORAL_CAPSULE | Freq: Every day | ORAL | Status: DC
  Administered 2015-03-07 – 2015-03-13 (×7): 1 g via ORAL
  Filled 2015-03-06 (×7): qty 1

## 2015-03-06 MED ORDER — ALUM & MAG HYDROXIDE-SIMETH 200-200-20 MG/5ML PO SUSP
15.0000 mL | Freq: Once | ORAL | Status: AC
  Administered 2015-03-06: 15 mL via ORAL
  Filled 2015-03-06: qty 30

## 2015-03-06 MED ORDER — IOHEXOL 350 MG/ML SOLN
100.0000 mL | Freq: Once | INTRAVENOUS | Status: AC | PRN
  Administered 2015-03-06: 100 mL via INTRAVENOUS

## 2015-03-06 MED ORDER — SPIRONOLACTONE 25 MG PO TABS
25.0000 mg | ORAL_TABLET | Freq: Every day | ORAL | Status: DC
Start: 2015-03-06 — End: 2015-03-08
  Administered 2015-03-07: 25 mg via ORAL
  Filled 2015-03-06 (×2): qty 1

## 2015-03-06 MED ORDER — ALLOPURINOL 300 MG PO TABS
300.0000 mg | ORAL_TABLET | Freq: Every day | ORAL | Status: DC
  Administered 2015-03-07 – 2015-03-13 (×7): 300 mg via ORAL
  Filled 2015-03-06 (×7): qty 1

## 2015-03-06 MED ORDER — SODIUM CHLORIDE 0.9 % IV SOLN
INTRAVENOUS | Status: DC
  Administered 2015-03-06 – 2015-03-09 (×6): via INTRAVENOUS

## 2015-03-06 MED ORDER — LORATADINE 10 MG PO TABS
10.0000 mg | ORAL_TABLET | Freq: Every day | ORAL | Status: DC
  Administered 2015-03-07 – 2015-03-13 (×7): 10 mg via ORAL
  Filled 2015-03-06 (×8): qty 1

## 2015-03-06 MED ORDER — ALLOPURINOL 150 MG HALF TABLET
150.0000 mg | ORAL_TABLET | Freq: Every day | ORAL | Status: DC
  Administered 2015-03-06 – 2015-03-12 (×7): 150 mg via ORAL
  Filled 2015-03-06 (×8): qty 1

## 2015-03-06 MED ORDER — ONDANSETRON HCL 4 MG/2ML IJ SOLN
4.0000 mg | Freq: Once | INTRAMUSCULAR | Status: AC
  Administered 2015-03-06: 4 mg via INTRAVENOUS
  Filled 2015-03-06: qty 2

## 2015-03-06 MED ORDER — ONDANSETRON HCL 4 MG PO TABS: 4.0000 mg | ORAL_TABLET | Freq: Four times a day (QID) | ORAL | Status: DC | PRN

## 2015-03-06 MED ORDER — CALCIUM CARBONATE 1500 (600 CA) MG PO TABS
1500.0000 mg | ORAL_TABLET | Freq: Every day | ORAL | Status: DC
  Administered 2015-03-07: 1500 mg via ORAL
  Filled 2015-03-06 (×3): qty 1

## 2015-03-06 MED ORDER — MORPHINE SULFATE (PF) 4 MG/ML IV SOLN
4.0000 mg | Freq: Once | INTRAVENOUS | Status: AC
  Administered 2015-03-06: 4 mg via INTRAVENOUS
  Filled 2015-03-06: qty 1

## 2015-03-06 MED ORDER — ONDANSETRON HCL 4 MG/2ML IJ SOLN
4.0000 mg | Freq: Four times a day (QID) | INTRAMUSCULAR | Status: DC | PRN
  Administered 2015-03-06: 4 mg via INTRAVENOUS
  Filled 2015-03-06: qty 2

## 2015-03-06 MED ORDER — ADULT MULTIVITAMIN W/MINERALS CH
1.0000 | ORAL_TABLET | Freq: Every day | ORAL | Status: DC
  Administered 2015-03-07 – 2015-03-13 (×7): 1 via ORAL
  Filled 2015-03-06 (×7): qty 1

## 2015-03-06 MED ORDER — MELOXICAM 7.5 MG PO TABS
7.5000 mg | ORAL_TABLET | Freq: Every day | ORAL | Status: DC
  Filled 2015-03-06 (×2): qty 1

## 2015-03-06 MED ORDER — NITROGLYCERIN 0.4 MG SL SUBL
0.4000 mg | SUBLINGUAL_TABLET | SUBLINGUAL | Status: AC | PRN
  Administered 2015-03-06 (×3): 0.4 mg via SUBLINGUAL
  Filled 2015-03-06: qty 1

## 2015-03-06 MED ORDER — ASPIRIN 81 MG PO CHEW
324.0000 mg | CHEWABLE_TABLET | Freq: Once | ORAL | Status: AC
  Administered 2015-03-06: 324 mg via ORAL
  Filled 2015-03-06: qty 4

## 2015-03-06 MED ORDER — KETOROLAC TROMETHAMINE 30 MG/ML IJ SOLN
15.0000 mg | Freq: Once | INTRAMUSCULAR | Status: AC
Start: 2015-03-06 — End: 2015-03-06
  Administered 2015-03-06: 15 mg via INTRAVENOUS
  Filled 2015-03-06: qty 1

## 2015-03-06 MED ORDER — SODIUM CHLORIDE 0.45 % IV SOLN: INTRAVENOUS | Status: DC

## 2015-03-06 MED ORDER — FUROSEMIDE 40 MG PO TABS: 40.0000 mg | ORAL_TABLET | Freq: Every day | ORAL | Status: DC | PRN

## 2015-03-06 MED ORDER — HYDROMORPHONE HCL 1 MG/ML IJ SOLN
1.0000 mg | INTRAMUSCULAR | Status: DC | PRN
  Administered 2015-03-06 – 2015-03-07 (×3): 1 mg via INTRAVENOUS
  Filled 2015-03-06 (×3): qty 1

## 2015-03-06 MED ORDER — DOXAZOSIN MESYLATE 2 MG PO TABS
2.0000 mg | ORAL_TABLET | Freq: Every day | ORAL | Status: DC
  Administered 2015-03-06 – 2015-03-12 (×7): 2 mg via ORAL
  Filled 2015-03-06 (×8): qty 1

## 2015-03-06 MED ORDER — ALLOPURINOL 300 MG PO TABS: 150.0000 mg | ORAL_TABLET | Freq: Two times a day (BID) | ORAL | Status: DC

## 2015-03-06 MED ORDER — ASPIRIN EC 81 MG PO TBEC
81.0000 mg | DELAYED_RELEASE_TABLET | Freq: Every day | ORAL | Status: DC
  Administered 2015-03-07 – 2015-03-13 (×7): 81 mg via ORAL
  Filled 2015-03-06 (×8): qty 1

## 2015-03-06 MED ORDER — HYDROMORPHONE HCL 1 MG/ML IJ SOLN
1.0000 mg | Freq: Once | INTRAMUSCULAR | Status: AC
  Administered 2015-03-06: 1 mg via INTRAVENOUS
  Filled 2015-03-06: qty 1

## 2015-03-06 MED ORDER — METFORMIN HCL 500 MG PO TABS
500.0000 mg | ORAL_TABLET | Freq: Every day | ORAL | Status: DC
  Filled 2015-03-06: qty 1

## 2015-03-06 MED ORDER — PANTOPRAZOLE SODIUM 40 MG IV SOLR
40.0000 mg | Freq: Once | INTRAVENOUS | Status: AC
  Administered 2015-03-06: 40 mg via INTRAVENOUS
  Filled 2015-03-06: qty 40

## 2015-03-06 MED ORDER — METOPROLOL TARTRATE 25 MG PO TABS: 12.5000 mg | ORAL_TABLET | Freq: Two times a day (BID) | ORAL | Status: DC

## 2015-03-06 MED ORDER — HYDROMORPHONE HCL 1 MG/ML IJ SOLN: 1.0000 mg | Freq: Once | INTRAMUSCULAR | Status: DC

## 2015-03-06 MED ORDER — LEVOTHYROXINE SODIUM 50 MCG PO TABS
50.0000 ug | ORAL_TABLET | Freq: Every day | ORAL | Status: DC
  Administered 2015-03-07 – 2015-03-13 (×7): 50 ug via ORAL
  Filled 2015-03-06 (×7): qty 1

## 2015-03-06 NOTE — ED Provider Notes (Signed)
CSN: [id number]     Arrival date & time 03/06/15  [id number] History   First MD Initiated Contact with Patient 03/06/15 [phone]     Chief Complaint  Patient presents with  . Chest Pain     (Consider location/radiation/quality/duration/timing/severity/associated sxs/prior Treatment) Patient is a 70 y.o. male presenting with chest pain. The history is provided by the patient.  Chest Pain Pain location:  Substernal area and epigastric Pain quality: sharp and shooting   Pain radiates to:  Does not radiate Pain radiates to the back: no   Pain severity:  Severe Onset quality:  Gradual Duration:  5 hours Timing:  Constant Progression:  Worsening Chronicity:  New Relieved by:  Nothing Worsened by:  Deep breathing Ineffective treatments:  None tried Associated symptoms: cough (over past couple weeks but resolved. ) and nausea   Associated symptoms: no abdominal pain, no diaphoresis, no fatigue, no fever, no headache, no palpitations, no shortness of breath and not vomiting   Risk factors: diabetes mellitus, high cholesterol, hypertension, male sex, obesity and prior DVT/PE   Risk factors: no smoking     70 yo M with a chief complaint of chest pain. This was sharp and started when he was walking to the bathroom this morning. Patient denies shortness of breath or diaphoresis. Nausea denies vomiting. No prior history of coronary disease. Patient had a brother who died in his 38s secondary to an MI. Patient has hypertension hyperlipidemia and diabetes. No smoking. Had a DVT 30 years ago postsurgical. Denies any recent surgery recent travel, unilateral edema, prolonged immobilization. Denies recent hospitalization. Had a mild URI that has resolved over the past couple weeks. Patient is unsure of anything makes this better or worse. Describes it as sharp and nonradiating.  Past Medical History  Diagnosis Date  . GERD (gastroesophageal reflux disease)   . Gout   . History of basal cell carcinoma     W-S  Derm  . History of squamous cell carcinoma   . Hyperlipidemia   . Tachycardia   . Hypertension   . Allergy   . Rosacea   . Diabetes mellitus   . Arthritis   . Obesity    Past Surgical History  Procedure Laterality Date  . Tonsillectomy    . Pilonidal cyst excision    . Knee arthroscopy      R  . Colonoscopy with polypectomy      X3  . Esophagus dilated      X3  . Shoulder surgery      right shoulder  . Total knee arthroplasty      RIGHT  . Knee arthroscopy      bilaterally  . Total ethmoidectomy & sphenoidectomiy Bilateral 05/27/13    also reduction of turbinates   Family History  Problem Relation Age of Onset  . Lung cancer Mother     smoker  . Heart attack Brother 64    CBAG  . Lung cancer Maternal Aunt     smoker  . Heart attack Paternal Grandmother 40  . Heart disease Father     Rheumatic Heart Disease  . Heart attack Paternal Grandfather 35  . Diabetes Neg Hx   . Stroke Neg Hx   . Colon cancer Neg Hx   . Pancreatic cancer Neg Hx   . Rectal cancer Neg Hx   . Stomach cancer Neg Hx    Social History  Substance Use Topics  . Smoking status: Former Smoker    Types: Cigarettes  Quit date: 10/17/1973  . Smokeless tobacco: Never Used  . Alcohol Use: No    Review of Systems  Constitutional: Negative for fever, chills, diaphoresis and fatigue.  HENT: Negative for congestion and facial swelling.   Eyes: Negative for discharge and visual disturbance.  Respiratory: Positive for cough (over past couple weeks but resolved. ). Negative for shortness of breath.   Cardiovascular: Negative for chest pain and palpitations.  Gastrointestinal: Positive for nausea. Negative for vomiting, abdominal pain and diarrhea.  Musculoskeletal: Negative for myalgias and arthralgias.  Skin: Negative for color change and rash.  Neurological: Negative for tremors, syncope and headaches.  Psychiatric/Behavioral: Negative for confusion and dysphoric mood.      Allergies   Bupropion hcl; Codeine; Escitalopram oxalate; Sertraline hcl; Sulfa antibiotics; and Sulfur  Home Medications   Prior to Admission medications   Medication Sig Start Date End Date Taking? Authorizing Provider  allopurinol (ZYLOPRIM) 300 MG tablet Take 150-300 mg by mouth 2 (two) times daily. 1 tablet (300 mg) in the morning and 0.5 tablets (150 mg) at night   Yes Historical Provider, MD  doxazosin (CARDURA) 4 MG tablet Take 1 tablet (4 mg total) by mouth at bedtime. Patient taking differently: Take 2 mg by mouth at bedtime.  08/24/11  Yes Hendricks Limes, MD  furosemide (LASIX) 40 MG tablet Take 1 tablet (40 mg total) by mouth daily as needed for edema. 01/28/15  Yes Dorothy Spark, MD  meloxicam (MOBIC) 7.5 MG tablet Take 1 tablet (7.5 mg total) by mouth daily. 05/27/12  Yes Hendricks Limes, MD  metoprolol tartrate (LOPRESSOR) 25 MG tablet Take 12.5 mg by mouth 2 (two) times daily.   Yes Historical Provider, MD  omeprazole (PRILOSEC) 20 MG capsule Take 20 mg by mouth daily.    Yes Historical Provider, MD  aspirin EC 81 MG tablet Take 81 mg by mouth daily.    Historical Provider, MD  Calcium Carbonate (CALCIUM 600) 1500 MG TABS Take 1,500 mg by mouth daily.     Historical Provider, MD  cetirizine (ZYRTEC) 10 MG tablet Take 10 mg by mouth daily.    Historical Provider, MD  Cholecalciferol (VITAMIN D PO) Take 1 tablet by mouth daily.    Historical Provider, MD  Coenzyme Q10 (CO Q 10 PO) Take 1 capsule by mouth daily.     Historical Provider, MD  levothyroxine (SYNTHROID, LEVOTHROID) 50 MCG tablet Take 50 mcg by mouth daily before breakfast.    Historical Provider, MD  metFORMIN (GLUCOPHAGE) 500 MG tablet Take 1 tablet (500 mg total) by mouth daily with breakfast. 1 by mouth daily with breakfast, LABS DUE Patient taking differently: Take 500 mg by mouth daily with breakfast. LABS DUE 05/27/12   Hendricks Limes, MD  Multiple Vitamin (MULTIVITAMIN WITH MINERALS) TABS tablet Take 1 tablet by  mouth daily.    Historical Provider, MD  Omega-3 Fatty Acids (FISH OIL) 1000 MG CAPS Take 1 capsule by mouth daily.     Historical Provider, MD  pseudoephedrine-guaifenesin (MUCINEX D) 60-600 MG per tablet Take 1 tablet by mouth daily.     Historical Provider, MD  Red Yeast Rice Extract (RED YEAST RICE PO) Take 1 capsule by mouth daily.    Historical Provider, MD  Saw Palmetto, Serenoa repens, (SAW PALMETTO PO) Take 1 capsule by mouth daily.    Historical Provider, MD  spironolactone (ALDACTONE) 25 MG tablet Take 1 tablet (25 mg total) by mouth daily. 05/22/13   Hendricks Limes, MD  traMADol (ULTRAM) 50 MG tablet Take 1 tablet (50 mg total) by mouth every 6 (six) hours as needed. 01/25/14   Ankit Nanavati, MD   BP 131/74 mmHg  Pulse 104  Temp(Src) 97.9 F (36.6 C) (Oral)  Resp 16  SpO2 93% Physical Exam  Constitutional: He is oriented to person, place, and time. He appears well-developed and well-nourished. He appears distressed.  HENT:  Head: Normocephalic and atraumatic.  Eyes: EOM are normal. Pupils are equal, round, and reactive to light.  Neck: Normal range of motion. Neck supple. No JVD present.  Cardiovascular: Normal rate, regular rhythm and intact distal pulses.  Exam reveals no gallop and no friction rub.   No murmur heard. Pulmonary/Chest: No respiratory distress. He has no wheezes. He has no rales.  Abdominal: He exhibits no distension. There is no tenderness. There is no rebound and no guarding.  Musculoskeletal: Normal range of motion. He exhibits no edema or tenderness.  Neurological: He is alert and oriented to person, place, and time.  Skin: No rash noted. No pallor.  Psychiatric: He has a normal mood and affect. His behavior is normal.  Nursing note and vitals reviewed.   ED Course  Procedures (including critical care time) Labs Review Labs Reviewed  BASIC METABOLIC PANEL - Abnormal; Notable for the following:    Glucose, Bld 214 (*)    All other components  within normal limits  HEPATIC FUNCTION PANEL - Abnormal; Notable for the following:    AST 49 (*)    Total Bilirubin 1.4 (*)    Indirect Bilirubin 1.0 (*)    All other components within normal limits  CBC  LIPASE, BLOOD  I-STAT TROPOININ, ED  I-STAT TROPOININ, ED    Imaging Review Ct Abdomen Pelvis Wo Contrast  03/06/2015  CLINICAL DATA:  70 year old male with abdominal pain since 0300 hours. Pain also in the center of the chest. Initial encounter. EXAM: CT ABDOMEN AND PELVIS WITHOUT CONTRAST TECHNIQUE: Multidetector CT imaging of the abdomen and pelvis was performed following the standard protocol without IV contrast. COMPARISON:  Chest CTA 1012 hours today. Abdomen ultrasound 1101 hours today. FINDINGS: Stable visualized lung bases from earlier today, mild atelectasis and/or scarring. No pericardial or pleural effusion. Calcified thoracic aortic and coronary artery atherosclerosis. Advanced lumbar spine degeneration. No acute osseous abnormality identified. No pelvic free fluid. Excreted IV contrast in the renal collecting systems, ureters, and urinary bladder. Negative rectum. Redundant but otherwise negative sigmoid colon. Negative left colon, transverse colon, right colon and appendix. Negative terminal ileum. No dilated small bowel. Small volume of oral contrast in the small bowel. Decompressed stomach and duodenum. No abdominal free air or free fluid. Decreased density throughout the liver in keeping with hepatic steatosis. Noncontrast gallbladder, spleen, pancreas, and adrenal glands are within normal limits. Aortoiliac calcified atherosclerosis noted. No lymphadenopathy. No mesenteric stranding identified. IMPRESSION: 1. No acute or inflammatory process identified in the abdomen or pelvis. 2. Hepatic steatosis. 3. Calcified aortic and coronary artery atherosclerosis. 4. Excreted IV contrast in normal appearing renal collecting systems and urinary bladder. Electronically Signed   By: Genevie Ann  M.D.   On: 03/06/2015 13:23   Dg Chest 2 View  03/06/2015  CLINICAL DATA:  Low chest pain EXAM: CHEST - 2 VIEW COMPARISON:  06/09/2008 FINDINGS: The heart size and mediastinal contours are within normal limits. Both lungs are clear. The visualized skeletal structures are unremarkable. IMPRESSION: No active disease. Electronically Signed   By: Inez Catalina M.D.   On: 03/06/2015  09:41   Ct Angio Chest Pe W/cm &/or Wo Cm  03/06/2015  CLINICAL DATA:  70 year old male with central chest pain radiating outward. Additional complaints include indigestion and pain on deep inspiration. EXAM: CT ANGIOGRAPHY CHEST WITH CONTRAST TECHNIQUE: Multidetector CT imaging of the chest was performed using the standard protocol during bolus administration of intravenous contrast. Multiplanar CT image reconstructions and MIPs were obtained to evaluate the vascular anatomy. CONTRAST:  130mL OMNIPAQUE IOHEXOL 350 MG/ML SOLN COMPARISON:  Prior CTA chest 06/09/2008 FINDINGS: Mediastinum: Unremarkable CT appearance of the thyroid gland. No suspicious mediastinal or hilar adenopathy. No soft tissue mediastinal mass. The thoracic esophagus is unremarkable. Heart/Vascular: Relatively poor opacification of the pulmonary arteries beyond the segmental level. There is no evidence of focal filling defect to suggest acute pulmonary embolus. The heart is normal in size. No pericardial effusion. Calcifications present along the coronary arteries, including the left main and left anterior descending. Normal caliber thoracic aorta. Lungs/Pleura: Minimal atelectasis in the lower lobes and along the inferior aspect of the upper lobe along the major fissure. Tiny subpleural nodule within the minor fissure, unchanged dating back to 2010. No focal airspace consolidation, pulmonary edema, pleural effusion or pneumothorax. Bones/Soft Tissues: No acute fracture or aggressive appearing lytic or blastic osseous lesion. Upper Abdomen: Low attenuation of the  hepatic parenchyma with relative sparing around the gallbladder fossa consistent with hepatic steatosis. Otherwise, the visualized upper abdomen is unremarkable. Review of the MIP images confirms the above findings. IMPRESSION: 1. Negative for acute pulmonary embolus, pneumonia or other acute pulmonary process. 2. Mild dependent atelectasis versus scarring which is very similar in appearance compared to 06/09/2008. 3. Atherosclerosis including coronary artery calcifications. 4. Hepatic steatosis. Electronically Signed   By: Jacqulynn Cadet M.D.   On: 03/06/2015 10:39   US Abdomen Limited Ruq  03/06/2015  CLINICAL DATA:  Right upper quadrant pain for several hours EXAM: US ABDOMEN LIMITED - RIGHT UPPER QUADRANT COMPARISON:  None. FINDINGS: Gallbladder: No gallstones or wall thickening visualized. No sonographic Murphy sign noted by sonographer. Common bile duct: Diameter: 4 mm Liver: Increase in echogenicity consistent with fatty infiltration. IMPRESSION: Fatty liver. No other focal abnormality is noted. Electronically Signed   By: Inez Catalina M.D.   On: 03/06/2015 11:33   I have personally reviewed and evaluated these images and lab results as part of my medical decision-making.   EKG Interpretation   Date/Time:  Saturday March 06 2015 07:34:09 EST Ventricular Rate:  56 PR Interval:  170 QRS Duration: 96 QT Interval:  414 QTC Calculation: 399 R Axis:   48 Text Interpretation:  Sinus rhythm Low voltage, precordial leads No  significant change since last tracing Confirmed by Roni Friberg MD, DANIEL  [phone]) on 03/06/2015 7:40:07 AM    EKG Interpretation  Date/Time:  Saturday March 06 2015 08:02:30 EST Ventricular Rate:  61 PR Interval:  174 QRS Duration: 99 QT Interval:  425 QTC Calculation: 428 R Axis:   57 Text Interpretation:  Sinus rhythm Low voltage, precordial leads No significant change since last tracing Confirmed by Braedyn Kauk MD, DANIEL [phone]) on 03/06/2015 8:15:54 AM       EKG  Interpretation  Date/Time:  Saturday March 06 2015 09:33:36 EST Ventricular Rate:  71 PR Interval:  179 QRS Duration: 104 QT Interval:  429 QTC Calculation: 466 R Axis:   46 Text Interpretation:  Sinus arrhythmia Low voltage, precordial leads No significant change since last tracing Confirmed by Jamael Hoffmann MD, DANIEL [id number]) on 03/06/2015 9:37:53 AM  MDM   Final diagnoses:  RUQ abdominal pain    70 yo M with a chief complaint chest pain. Patient with multiple cardiac risk factors as well as advanced age and family history. Heart score is a 5.   Cardiology was consult for evaluation.  Patient reassessed, now states that his pain has moved to the RUQ.  Abdomen reassessed now with TTP to the RUQ.  LFT's added as well as lipase.  Will obtain a right upper quadrant ultrasound.  Right upper quadrant ultrasound is negative. Patient reassessed and having significant pain. Family states that the pain has moved again. It is now slightly lower and radiating directly into the back. Patient has a history of will thought to be kidney stones in the past. Will obtain a noncontrast CT scan of the abdomen and pelvis.  CT scan negative.  Patient continuing to have abdominal pain.   The patients results and plan were reviewed and discussed.   Any x-rays performed were independently reviewed by myself.   Differential diagnosis were considered with the presenting HPI.  Medications  HYDROmorphone (DILAUDID) injection 1 mg (1 mg Intravenous Not Given 03/06/15 1501)  0.45 % sodium chloride infusion (not administered)  ondansetron (ZOFRAN) tablet 4 mg ( Oral See Alternative 03/06/15 1515)    Or  ondansetron (ZOFRAN) injection 4 mg (4 mg Intravenous Given 03/06/15 1515)  aspirin chewable tablet 324 mg (324 mg Oral Given 03/06/15 0759)  nitroGLYCERIN (NITROSTAT) SL tablet 0.4 mg (0.4 mg Sublingual Given 03/06/15 0842)  alum & mag hydroxide-simeth (MAALOX/MYLANTA) 200-200-20 MG/5ML suspension 15 mL  (15 mLs Oral Given 03/06/15 0812)  lidocaine (XYLOCAINE) 2 % viscous mouth solution 15 mL (15 mLs Mouth/Throat Given 03/06/15 0812)  morphine 4 MG/ML injection 4 mg (4 mg Intravenous Given 03/06/15 0857)  iohexol (OMNIPAQUE) 350 MG/ML injection 100 mL (100 mLs Intravenous Contrast Given 03/06/15 1004)  morphine 4 MG/ML injection 4 mg (4 mg Intravenous Given 03/06/15 1022)  morphine 4 MG/ML injection 4 mg (4 mg Intravenous Given 03/06/15 1113)  ondansetron (ZOFRAN) injection 4 mg (4 mg Intravenous Given 03/06/15 1111)  HYDROmorphone (DILAUDID) injection 1 mg (1 mg Intravenous Given 03/06/15 1251)  ondansetron (ZOFRAN) injection 4 mg (4 mg Intravenous Given 03/06/15 1251)  ketorolac (TORADOL) 30 MG/ML injection 15 mg (15 mg Intravenous Given 03/06/15 1354)  pantoprazole (PROTONIX) injection 40 mg (40 mg Intravenous Given 03/06/15 1354)    Filed Vitals:   03/06/15 0938 03/06/15 1027 03/06/15 1245 03/06/15 1450  BP: 121/63 136/81 145/86 131/74  Pulse: 90 96 109 104  Temp:    97.9 F (36.6 C)  TempSrc:    Oral  Resp: 23 24 18 16   SpO2: 94% 95% 95% 93%    Final diagnoses:  RUQ abdominal pain    Admission/ observation were discussed with the admitting physician, patient and/or family and they are comfortable with the plan.    Deno Etienne, DO 03/06/15 1530

## 2015-03-06 NOTE — ED Notes (Signed)
Pt reports return of pain .

## 2015-03-06 NOTE — ED Notes (Signed)
Dr. Tyrone Nine made aware of pt's improvement in pain and movement of pain towards pt's abd area.

## 2015-03-06 NOTE — ED Notes (Signed)
Patient transported to CT 

## 2015-03-06 NOTE — ED Notes (Signed)
Pt still reports pain but it is better since the morphine.  Pt reports pain is beginning to increase.

## 2015-03-06 NOTE — ED Notes (Addendum)
Pt reports CP that began at 0300 this am. Pain is in center of chest and radiates outwards. Accompanied by pain when taking a deep breath and indigestion. Denies SOB, lightheadedness and dizziness. No cardiac hx. Tried OTC indigestion medications with no relief

## 2015-03-06 NOTE — Consult Note (Signed)
Reason for Consult: Chest pain  Requesting Physician: Tyrone Nine  Cardiologist: Meda Coffee  HPI: This is a 70 y.o. male with a past medical history significant for DM, hypertension, hyperlipidemia and a strong family history of premature CAD presents after waking at 3:30 AM with severe epigastric discomfort radiating to the retrosternal area. He describes the pain as sharp. It increases with deep breathing but is not associated with activity. According to his wife he seemed to have some relief standing up and leaning forward. He tried taking omeprazole and antacids without any relief. In the emergency room morphine has provided partial resolution.  His electrocardiogram is normal, without any repolarization abnormalities. Point-of-care troponin level is undetectable. He had a low risk nuclear stress test in November 2015.  He also has a history of previous esophageal dilatation on a couple of occasions, most recently last May. He has a history of exposure to agent orange and smoked in the remote past. He has been advised to take statins, but has refused and takes red yeast rice extract instead. He does use nonsteroidal anti-inflammatory drugs regularly  He has chronic exertional dyspnea and mild chronic lower extremity edema that improves with compression stockings and overnight recumbent position. He does not have a history of stroke or focal neurological events, syncope, palpitations. He denies recent fever, chills, cough, hemoptysis, lower extremity swelling or tenderness. He has not had nausea, vomiting, hematemesis or melena, urinary frequency or urgency, dysuria, hematuria.  PMHx:  Past Medical History  Diagnosis Date  . GERD (gastroesophageal reflux disease)   . Gout   . History of basal cell carcinoma     W-S Derm  . History of squamous cell carcinoma   . Hyperlipidemia   . Tachycardia   . Hypertension   . Allergy   . Rosacea   . Diabetes mellitus   . Arthritis   . Obesity      Past Surgical History  Procedure Laterality Date  . Tonsillectomy    . Pilonidal cyst excision    . Knee arthroscopy      R  . Colonoscopy with polypectomy      X3  . Esophagus dilated      X3  . Shoulder surgery      right shoulder  . Total knee arthroplasty      RIGHT  . Knee arthroscopy      bilaterally  . Total ethmoidectomy & sphenoidectomiy Bilateral 05/27/13    also reduction of turbinates    FAMHx: Family History  Problem Relation Age of Onset  . Lung cancer Mother     smoker  . Heart attack Brother 59    CBAG  . Lung cancer Maternal Aunt     smoker  . Heart attack Paternal Grandmother 67  . Heart disease Father     Rheumatic Heart Disease  . Heart attack Paternal Grandfather 3  . Diabetes Neg Hx   . Stroke Neg Hx   . Colon cancer Neg Hx   . Pancreatic cancer Neg Hx   . Rectal cancer Neg Hx   . Stomach cancer Neg Hx     SOCHx:  reports that he quit smoking about 41 years ago. His smoking use included Cigarettes. He has never used smokeless tobacco. He reports that he does not drink alcohol or use illicit drugs.  ALLERGIES: Allergies  Allergen Reactions  . Bupropion Hcl     REACTION: insomnia  . Codeine     REACTION: nausea  .  Escitalopram Oxalate     REACTION: insomnia  . Sertraline Hcl     REACTION: insomnia  . Sulfa Antibiotics     Pt can not remember  . Sulfur Other (See Comments)    ROS: Pertinent items noted in HPI and remainder of comprehensive ROS otherwise negative.  HOME MEDICATIONS: No current facility-administered medications on file prior to encounter.   Current Outpatient Prescriptions on File Prior to Encounter  Medication Sig Dispense Refill  . allopurinol (ZYLOPRIM) 300 MG tablet Take 150-300 mg by mouth 2 (two) times daily. 1 tablet (300 mg) in the morning and 0.5 tablets (150 mg) at night    . aspirin EC 81 MG tablet Take 81 mg by mouth daily.    . Calcium Carbonate (CALCIUM 600) 1500 MG TABS Take 1,500 mg by mouth  daily.     . cetirizine (ZYRTEC) 10 MG tablet Take 10 mg by mouth daily.    . Cholecalciferol (VITAMIN D PO) Take 1 tablet by mouth daily.    . Coenzyme Q10 (CO Q 10 PO) Take 1 capsule by mouth daily.     Marland Kitchen doxazosin (CARDURA) 4 MG tablet Take 1 tablet (4 mg total) by mouth at bedtime. (Patient taking differently: Take 2 mg by mouth at bedtime. ) 90 tablet 1  . furosemide (LASIX) 40 MG tablet Take 1 tablet (40 mg total) by mouth daily as needed for edema. 90 tablet 3  . levothyroxine (SYNTHROID, LEVOTHROID) 50 MCG tablet Take 50 mcg by mouth daily before breakfast.    . meloxicam (MOBIC) 7.5 MG tablet Take 1 tablet (7.5 mg total) by mouth daily. 90 tablet 2  . metFORMIN (GLUCOPHAGE) 500 MG tablet Take 1 tablet (500 mg total) by mouth daily with breakfast. 1 by mouth daily with breakfast, LABS DUE (Patient taking differently: Take 500 mg by mouth daily with breakfast. LABS DUE) 30 tablet 0  . metoprolol tartrate (LOPRESSOR) 25 MG tablet Take 12.5 mg by mouth 2 (two) times daily.    . Multiple Vitamin (MULTIVITAMIN WITH MINERALS) TABS tablet Take 1 tablet by mouth daily.    . Omega-3 Fatty Acids (FISH OIL) 1000 MG CAPS Take 1 capsule by mouth daily.     Marland Kitchen omeprazole (PRILOSEC) 20 MG capsule Take 20 mg by mouth daily.     . pseudoephedrine-guaifenesin (MUCINEX D) 60-600 MG per tablet Take 1 tablet by mouth daily.     . Red Yeast Rice Extract (RED YEAST RICE PO) Take 1 capsule by mouth daily.    . Saw Palmetto, Serenoa repens, (SAW PALMETTO PO) Take 1 capsule by mouth daily.    Marland Kitchen spironolactone (ALDACTONE) 25 MG tablet Take 1 tablet (25 mg total) by mouth daily. 30 tablet 3  . traMADol (ULTRAM) 50 MG tablet Take 1 tablet (50 mg total) by mouth every 6 (six) hours as needed. 15 tablet 0    VITALS: Blood pressure 146/90, pulse 91, temperature 97.8 F (36.6 C), temperature source Oral, resp. rate 23, SpO2 97 %.  PHYSICAL EXAM:  General: Alert, oriented x3, no distress Head: no evidence of trauma,  PERRL, EOMI, no exophtalmos or lid lag, no myxedema, no xanthelasma; normal ears, nose and oropharynx Neck: Normal jugular venous pulsations and no hepatojugular reflux; brisk carotid pulses without delay and no carotid bruits Chest: clear to auscultation, no signs of consolidation by percussion or palpation, normal fremitus, symmetrical and full respiratory excursions Cardiovascular: normal position and quality of the apical impulse, regular rhythm, normal first heart sound and normal  second heart sound, no rubs or gallops, no murmur Abdomen: He appears to be slightly tender to palpation across the upper abdomen, more prominently in the right upper quadrant as opposed to the epigastrium, no frank peritoneal signs, no masses by palpation, no abnormal pulsatility or arterial bruits, normal bowel sounds, no hepatosplenomegaly Extremities: no clubbing, cyanosis;  no edema; 2+ radial, ulnar and brachial pulses bilaterally; 2+ right femoral, posterior tibial and dorsalis pedis pulses; 2+ left femoral, posterior tibial and dorsalis pedis pulses; no subclavian or femoral bruits Neurological: grossly nonfocal   LABS  CBC  Recent Labs  03/06/15 0810  WBC 6.7  HGB 14.2  HCT 42.3  MCV 93.6  PLT 99991111   Basic Metabolic Panel  Recent Labs  03/06/15 0810  NA 135  K 4.8  CL 102  CO2 22  GLUCOSE 214*  BUN 17  CREATININE 1.04  CALCIUM 9.3     IMAGING: No results found.  ECG: Sinus rhythm with a single PAC, no repolarization abnormalities  TELEMETRY:  sinus rhythm  IMPRESSION: 1. Sharp upper abdominal pain radiating to the chest, with some right upper quadrant tenderness suggests most likely gastrointestinal etiology for his complaint. Consider ordering liver function tests and amylase to screen for pancreatitis. If symptoms do not abate, consider CT abdomen. Note normal white blood cell count and hemoglobin levels 2. Chest pain appears to be mostly radiating from the abdomen. Normal ECG  during severe chest pain is reassuring, as is the normal cardiac troponin level. Would repeat another electrocardiogram and cardiac troponin level 3-4 hours later. He has multiple coronary risk factors, but I think the likelihood that his current symptoms are coronary in etiology is low. If repeat ECG and cardiac enzymes are normal, I don't think he will need additional inpatient cardiac workup, but recommend follow-up with Dr. Meda Coffee and possibly repeating a stress test.    Time Spent Directly with Patient: 30 minutes  Sanda Klein, MD, [facility] [phone] office [phone] pager   03/06/2015, 9:27 AM

## 2015-03-06 NOTE — ED Notes (Signed)
I have just phoned report to Beverlee Nims, Therapist, sports and we will transport to [address] [place] shortly.

## 2015-03-06 NOTE — H&P (Signed)
[facility] History and Physical  Terry Lyons [id number] DOB: 07-26-1945 DOA: 03/06/2015  Referring physician: ER physician: Terry Lyons PCP: Terry Ly, MD  Chief Complaint: abdominal pain   HPI:  70 year old male with past medical history of diabetes, dyslipidemia, hypertension, hypothyroidism has seen cardiology back in December 2016 Dr. Meda Coffee for dyspnea on exertion at which time recommendation was made for risk factor modifications. Patient also has history of GI workup with endoscopy for dysphagia and his most recent dilation was 06/26/2014. He presented to [facility] with reports of epigastric pain radiating to chest area, sharp, 10 out of 10 in intensity, worse with breathing and no significant alleviating factors. Patient reports associated nausea and nonbloody vomiting. He takes daily aspirin, he also takes Prilosec but this has not provided significant symptomatic relief. No reports of fevers or chills. No reports of cough. No diarrhea or constipation.  In ED, patient was hemodynamically stable. Blood work was unremarkable. CT angiogram of the chest did not reveal pulmonary embolism. CT abdomen showed no acute intra-abdominal findings. Abdominal ultrasound showed fatty liver, otherwise without acute findings. Because he was nauseous and had ongoing vomiting as well as intractable pain [facility] asked to admit for further evaluation.  Assessment & Plan    Active Problems:   Intractable abdominal pain / intractable nausea and vomiting - Unclear etiology. Patient was seen by cardiology as initial thought was that his pain is more of a cardiac rather than GI related however cardiology does not believe this is related to heart - No acute findings on 12-lead EKG - No acute findings on abdominal ultrasound or CT abdomen - Lipase was within normal limits - Patient has gotten multiple doses of morphine for pain control. His pain was better controlled with the Dilaudid. - We'll  continue supportive care with IV fluids, analgesia and antiemetics as needed   Hypothyroidism - Continue Synthroid    Controlled diabetes mellitus without complications without long-term insulin use - No recent A1c but the one in 2015 was just about at the goal number, 7.1 - Continue metformin    Essential hypertension - Continue metoprolol and spironolactone    Gout - Continue allopurinol   DVT prophylaxis:  - SCD's bilaterally   Radiological Exams on Admission: Ct Abdomen Pelvis Wo Contrast 03/06/2015  1. No acute or inflammatory process identified in the abdomen or pelvis. 2. Hepatic steatosis. 3. Calcified aortic and coronary artery atherosclerosis. 4. Excreted IV contrast in normal appearing renal collecting systems and urinary bladder. Electronically Signed   By: Terry Lyons M.D.   On: 03/06/2015 13:23   Ct Angio Chest Pe W/cm &/or Wo Cm 03/06/2015  1. Negative for acute pulmonary embolus, pneumonia or other acute pulmonary process. 2. Mild dependent atelectasis versus scarring which is very similar in appearance compared to 06/09/2008. 3. Atherosclerosis including coronary artery calcifications. 4. Hepatic steatosis. Electronically Signed   By: Terry Lyons M.D.   On: 03/06/2015 10:39   US Abdomen Limited Ruq 03/06/2015  Fatty liver. No other focal abnormality is noted. Electronically Signed   By: Terry Lyons M.D.   On: 03/06/2015 11:33    EKG: I have personally reviewed EKG. EKG shows sinus arrhythmia  Code Status: Full Family Communication: Plan of care discussed with the patient  Disposition Plan: Admit for further evaluation, medical floor,observation   Terry Lenz, MD  [facility] Pager [phone]  Time spent in minutes: 75 minutes  Review of Systems:  Constitutional: Negative for fever, chills and malaise/fatigue.  Negative for diaphoresis.  HENT: Negative for hearing loss, ear pain, nosebleeds, congestion, sore throat, neck pain, tinnitus and ear discharge.    Eyes: Negative for blurred vision, double vision, photophobia, pain, discharge and redness.  Respiratory: Negative for cough, hemoptysis, sputum production, shortness of breath, wheezing and stridor.   Cardiovascular: Negative for chest pain, palpitations, orthopnea, claudication and leg swelling.  Gastrointestinal: per HPI  Genitourinary: Negative for dysuria, urgency, frequency, hematuria and flank pain.  Musculoskeletal: Negative for myalgias, back pain, joint pain and falls.  Skin: Negative for itching and rash.  Neurological: Negative for dizziness and weakness. Negative for tingling, tremors, sensory change, speech change, focal weakness, loss of consciousness and headaches.  Endo/Heme/Allergies: Negative for environmental allergies and polydipsia. Does not bruise/bleed easily.  Psychiatric/Behavioral: Negative for suicidal ideas. The patient is not nervous/anxious.      Past Medical History  Diagnosis Date  . GERD (gastroesophageal reflux disease)   . Gout   . History of basal cell carcinoma     W-S Derm  . History of squamous cell carcinoma   . Hyperlipidemia   . Tachycardia   . Hypertension   . Allergy   . Rosacea   . Diabetes mellitus   . Arthritis   . Obesity    Past Surgical History  Procedure Laterality Date  . Tonsillectomy    . Pilonidal cyst excision    . Knee arthroscopy      R  . Colonoscopy with polypectomy      X3  . Esophagus dilated      X3  . Shoulder surgery      right shoulder  . Total knee arthroplasty      RIGHT  . Knee arthroscopy      bilaterally  . Total ethmoidectomy & sphenoidectomiy Bilateral 05/27/13    also reduction of turbinates   Social History:  reports that he quit smoking about 41 years ago. His smoking use included Cigarettes. He has never used smokeless tobacco. He reports that he does not drink alcohol or use illicit drugs.  Allergies  Allergen Reactions  . Bupropion Hcl     REACTION: insomnia  . Codeine     REACTION:  nausea  . Escitalopram Oxalate     REACTION: insomnia  . Sertraline Hcl     REACTION: insomnia  . Sulfa Antibiotics     Pt can not remember  . Sulfur Other (See Comments)    Family History:  Family History  Problem Relation Age of Onset  . Lung cancer Mother     smoker  . Heart attack Brother 79    CBAG  . Lung cancer Maternal Aunt     smoker  . Heart attack Paternal Grandmother 56  . Heart disease Father     Rheumatic Heart Disease  . Heart attack Paternal Grandfather 39  . Diabetes Neg Hx   . Stroke Neg Hx   . Colon cancer Neg Hx   . Pancreatic cancer Neg Hx   . Rectal cancer Neg Hx   . Stomach cancer Neg Hx      Prior to Admission medications   Medication Sig Start Date End Date Taking? Authorizing Provider  allopurinol (ZYLOPRIM) 300 MG tablet Take 150-300 mg by mouth 2 (two) times daily. 1 tablet (300 mg) in the morning and 0.5 tablets (150 mg) at night   Yes Historical Provider, MD  doxazosin (CARDURA) 4 MG tablet Take 1 tablet (4 mg total) by mouth at bedtime. Patient taking differently:  Take 2 mg by mouth at bedtime.  08/24/11  Yes Hendricks Limes, MD  furosemide (LASIX) 40 MG tablet Take 1 tablet (40 mg total) by mouth daily as needed for edema. 01/28/15  Yes Dorothy Spark, MD  meloxicam (MOBIC) 7.5 MG tablet Take 1 tablet (7.5 mg total) by mouth daily. 05/27/12  Yes Hendricks Limes, MD  metoprolol tartrate (LOPRESSOR) 25 MG tablet Take 12.5 mg by mouth 2 (two) times daily.   Yes Historical Provider, MD  omeprazole (PRILOSEC) 20 MG capsule Take 20 mg by mouth daily.    Yes Historical Provider, MD  aspirin EC 81 MG tablet Take 81 mg by mouth daily.    Historical Provider, MD  Calcium Carbonate (CALCIUM 600) 1500 MG TABS Take 1,500 mg by mouth daily.     Historical Provider, MD  cetirizine (ZYRTEC) 10 MG tablet Take 10 mg by mouth daily.    Historical Provider, MD  Cholecalciferol (VITAMIN D PO) Take 1 tablet by mouth daily.    Historical Provider, MD  Coenzyme  Q10 (CO Q 10 PO) Take 1 capsule by mouth daily.     Historical Provider, MD  levothyroxine (SYNTHROID, LEVOTHROID) 50 MCG tablet Take 50 mcg by mouth daily before breakfast.    Historical Provider, MD  metFORMIN (GLUCOPHAGE) 500 MG tablet Take 1 tablet (500 mg total) by mouth daily with breakfast. 1 by mouth daily with breakfast, LABS DUE Patient taking differently: Take 500 mg by mouth daily with breakfast. LABS DUE 05/27/12   Hendricks Limes, MD  Multiple Vitamin (MULTIVITAMIN WITH MINERALS) TABS tablet Take 1 tablet by mouth daily.    Historical Provider, MD  Omega-3 Fatty Acids (FISH OIL) 1000 MG CAPS Take 1 capsule by mouth daily.     Historical Provider, MD  pseudoephedrine-guaifenesin (MUCINEX D) 60-600 MG per tablet Take 1 tablet by mouth daily.     Historical Provider, MD  Red Yeast Rice Extract (RED YEAST RICE PO) Take 1 capsule by mouth daily.    Historical Provider, MD  Saw Palmetto, Serenoa repens, (SAW PALMETTO PO) Take 1 capsule by mouth daily.    Historical Provider, MD  spironolactone (ALDACTONE) 25 MG tablet Take 1 tablet (25 mg total) by mouth daily. 05/22/13   Hendricks Limes, MD  traMADol (ULTRAM) 50 MG tablet Take 1 tablet (50 mg total) by mouth every 6 (six) hours as needed. 01/25/14   Varney Biles, MD   Physical Exam: Filed Vitals:   03/06/15 [id number] 03/06/15 0938 03/06/15 1027 03/06/15 1245  BP: 146/90 121/63 136/81 145/86  Pulse: 91 90 96 109  Temp:      TempSrc:      Resp:  23 24 18   SpO2:  94% 95% 95%    Physical Exam  Constitutional: Appears well-developed and well-nourished. No distress.  HENT: Normocephalic. No tonsillar erythema or exudates Eyes: Conjunctivae are normal. No scleral icterus.  Neck: Normal ROM. Neck supple. No JVD. No tracheal deviation. No thyromegaly.  CVS: RRR, S1/S2 appreciated Pulmonary: Effort and breath sounds normal, no stridor, rhonchi, wheezes, rales.  Abdominal:  tender in mid, upper abdomen without rebound tenderness or guarding,  appreciate bowel sounds  Musculoskeletal: Normal range of motion. No edema and no tenderness.  Lymphadenopathy: No lymphadenopathy noted, cervical, inguinal. Neuro: Alert. Normal reflexes, muscle tone coordination. No focal neurologic deficits. Skin: Skin is warm and dry. No rash noted.  No erythema. No pallor.  Psychiatric: Normal mood and affect. Behavior, judgment, thought content normal.   Labs  on Admission:  Basic Metabolic Panel:  Recent Labs Lab 03/06/15 0810  NA 135  K 4.8  CL 102  CO2 22  GLUCOSE 214*  BUN 17  CREATININE 1.04  CALCIUM 9.3   Liver Function Tests:  Recent Labs Lab 03/06/15 0810  AST 49*  ALT 36  ALKPHOS 83  BILITOT 1.4*  PROT 6.8  ALBUMIN 3.6    Recent Labs Lab 03/06/15 1222  LIPASE 33   No results for input(s): AMMONIA in the last 168 hours. CBC:  Recent Labs Lab 03/06/15 0810  WBC 6.7  HGB 14.2  HCT 42.3  MCV 93.6  PLT 218   Cardiac Enzymes: No results for input(s): CKTOTAL, CKMB, CKMBINDEX, TROPONINI in the last 168 hours. BNP: Invalid input(s): POCBNP CBG: No results for input(s): GLUCAP in the last 168 hours.  If 7PM-7AM, please contact night-coverage [email or web address] Password [email or web address] 03/06/2015, 2:21 PM

## 2015-03-07 ENCOUNTER — Inpatient Hospital Stay (HOSPITAL_COMMUNITY): Payer: Non-veteran care

## 2015-03-07 DIAGNOSIS — IMO0001 Reserved for inherently not codable concepts without codable children: Secondary | ICD-10-CM | POA: Diagnosis present

## 2015-03-07 DIAGNOSIS — I10 Essential (primary) hypertension: Secondary | ICD-10-CM | POA: Diagnosis not present

## 2015-03-07 DIAGNOSIS — R1013 Epigastric pain: Secondary | ICD-10-CM | POA: Diagnosis not present

## 2015-03-07 DIAGNOSIS — N179 Acute kidney failure, unspecified: Secondary | ICD-10-CM | POA: Diagnosis not present

## 2015-03-07 DIAGNOSIS — R1110 Vomiting, unspecified: Secondary | ICD-10-CM | POA: Diagnosis not present

## 2015-03-07 DIAGNOSIS — B3781 Candidal esophagitis: Secondary | ICD-10-CM | POA: Diagnosis not present

## 2015-03-07 DIAGNOSIS — R071 Chest pain on breathing: Secondary | ICD-10-CM | POA: Diagnosis present

## 2015-03-07 DIAGNOSIS — E1165 Type 2 diabetes mellitus with hyperglycemia: Secondary | ICD-10-CM

## 2015-03-07 DIAGNOSIS — I5031 Acute diastolic (congestive) heart failure: Secondary | ICD-10-CM | POA: Diagnosis not present

## 2015-03-07 DIAGNOSIS — R079 Chest pain, unspecified: Secondary | ICD-10-CM | POA: Diagnosis not present

## 2015-03-07 DIAGNOSIS — G43A1 Cyclical vomiting, intractable: Secondary | ICD-10-CM | POA: Diagnosis not present

## 2015-03-07 DIAGNOSIS — J181 Lobar pneumonia, unspecified organism: Secondary | ICD-10-CM | POA: Diagnosis not present

## 2015-03-07 LAB — URINALYSIS, ROUTINE W REFLEX MICROSCOPIC
GLUCOSE, UA: NEGATIVE mg/dL
Glucose, UA: NEGATIVE mg/dL
Hgb urine dipstick: NEGATIVE
KETONES UR: NEGATIVE mg/dL
KETONES UR: NEGATIVE mg/dL
LEUKOCYTES UA: NEGATIVE
NITRITE: NEGATIVE
Nitrite: POSITIVE — AB
PH: 5.5 (ref 5.0–8.0)
PROTEIN: NEGATIVE mg/dL
PROTEIN: 30 mg/dL — AB
Specific Gravity, Urine: >1.046 — ABNORMAL HIGH (ref 1.005–1.030)
Specific Gravity, Urine: 1.04 — ABNORMAL HIGH (ref 1.005–1.030)
pH: 5 (ref 5.0–8.0)

## 2015-03-07 LAB — URINE MICROSCOPIC-ADD ON: RBC / HPF: NONE SEEN RBC/hpf (ref 0–5)

## 2015-03-07 LAB — COMPREHENSIVE METABOLIC PANEL
ALBUMIN: 3.7 g/dL (ref 3.5–5.0)
ALK PHOS: 62 U/L (ref 38–126)
ALT: 37 U/L (ref 17–63)
AST: 39 U/L (ref 15–41)
Anion gap: 12 (ref 5–15)
BILIRUBIN TOTAL: 1.2 mg/dL (ref 0.3–1.2)
BUN: 21 mg/dL — AB (ref 6–20)
CALCIUM: 8.7 mg/dL — AB (ref 8.9–10.3)
CO2: 24 mmol/L (ref 22–32)
CREATININE: 1.07 mg/dL (ref 0.61–1.24)
Chloride: 98 mmol/L — ABNORMAL LOW (ref 101–111)
GFR calc Af Amer: >60 mL/min (ref >60)
GLUCOSE: 150 mg/dL — AB (ref 65–99)
Potassium: 4.6 mmol/L (ref 3.5–5.1)
Sodium: 134 mmol/L — ABNORMAL LOW (ref 135–145)
TOTAL PROTEIN: 7.1 g/dL (ref 6.5–8.1)

## 2015-03-07 LAB — GLUCOSE, CAPILLARY
GLUCOSE-CAPILLARY: 175 mg/dL — AB (ref 65–99)
GLUCOSE-CAPILLARY: 159 mg/dL — AB (ref 65–99)
Glucose-Capillary: 146 mg/dL — ABNORMAL HIGH (ref 65–99)
Glucose-Capillary: 144 mg/dL — ABNORMAL HIGH (ref 65–99)

## 2015-03-07 LAB — CBC
HEMATOCRIT: 43 % (ref 39.0–52.0)
Hemoglobin: 13.9 g/dL (ref 13.0–17.0)
MCH: 31.4 pg (ref 26.0–34.0)
MCHC: 32.3 g/dL (ref 30.0–36.0)
MCV: 97.1 fL (ref 78.0–100.0)
PLATELETS: 200 10*3/uL (ref 150–400)
RBC: 4.43 MIL/uL (ref 4.22–5.81)
RDW: 15.3 % (ref 11.5–15.5)
WBC: 15.4 10*3/uL — AB (ref 4.0–10.5)

## 2015-03-07 MED ORDER — METOPROLOL TARTRATE 1 MG/ML IV SOLN
5.0000 mg | Freq: Once | INTRAVENOUS | Status: AC
  Administered 2015-03-07: 5 mg via INTRAVENOUS
  Filled 2015-03-07: qty 5

## 2015-03-07 MED ORDER — HYDROMORPHONE HCL 2 MG/ML IJ SOLN
2.0000 mg | INTRAMUSCULAR | Status: DC | PRN
  Administered 2015-03-07 (×2): 2 mg via INTRAVENOUS
  Filled 2015-03-07 (×3): qty 1

## 2015-03-07 MED ORDER — INSULIN ASPART 100 UNIT/ML ~~LOC~~ SOLN: 0.0000 [IU] | Freq: Every day | SUBCUTANEOUS | Status: DC

## 2015-03-07 MED ORDER — PANTOPRAZOLE SODIUM 40 MG IV SOLR
40.0000 mg | Freq: Two times a day (BID) | INTRAVENOUS | Status: DC
  Administered 2015-03-07 – 2015-03-13 (×13): 40 mg via INTRAVENOUS
  Filled 2015-03-07 (×13): qty 40

## 2015-03-07 MED ORDER — INSULIN ASPART 100 UNIT/ML ~~LOC~~ SOLN
0.0000 [IU] | Freq: Three times a day (TID) | SUBCUTANEOUS | Status: DC
  Administered 2015-03-07 – 2015-03-08 (×4): 3 [IU] via SUBCUTANEOUS
  Administered 2015-03-08 – 2015-03-09 (×2): 2 [IU] via SUBCUTANEOUS
  Administered 2015-03-09 (×2): 3 [IU] via SUBCUTANEOUS
  Administered 2015-03-10 – 2015-03-11 (×3): 2 [IU] via SUBCUTANEOUS
  Administered 2015-03-11 – 2015-03-12 (×4): 3 [IU] via SUBCUTANEOUS
  Administered 2015-03-12: 5 [IU] via SUBCUTANEOUS
  Administered 2015-03-13: 3 [IU] via SUBCUTANEOUS

## 2015-03-07 MED ORDER — INSULIN ASPART 100 UNIT/ML ~~LOC~~ SOLN
3.0000 [IU] | Freq: Three times a day (TID) | SUBCUTANEOUS | Status: DC
  Administered 2015-03-07 – 2015-03-13 (×9): 3 [IU] via SUBCUTANEOUS

## 2015-03-07 MED ORDER — SODIUM CHLORIDE 0.9 % IV BOLUS (SEPSIS)
500.0000 mL | Freq: Once | INTRAVENOUS | Status: AC
  Administered 2015-03-07: 500 mL via INTRAVENOUS

## 2015-03-07 MED ORDER — OXYCODONE HCL 5 MG PO TABS
5.0000 mg | ORAL_TABLET | Freq: Four times a day (QID) | ORAL | Status: DC | PRN
  Administered 2015-03-07: 10 mg via ORAL
  Filled 2015-03-07: qty 2

## 2015-03-07 MED ORDER — METOPROLOL TARTRATE 25 MG PO TABS
12.5000 mg | ORAL_TABLET | Freq: Two times a day (BID) | ORAL | Status: DC
Start: 2015-03-07 — End: 2015-03-12
  Administered 2015-03-07 – 2015-03-12 (×10): 12.5 mg via ORAL
  Filled 2015-03-07 (×10): qty 1
  Filled 2015-03-07: qty 0.5

## 2015-03-07 MED ORDER — ALUM & MAG HYDROXIDE-SIMETH 200-200-20 MG/5ML PO SUSP
15.0000 mL | ORAL | Status: DC | PRN
  Filled 2015-03-07: qty 30

## 2015-03-07 MED ORDER — ALUM & MAG HYDROXIDE-SIMETH 200-200-20 MG/5 ML NICU TOPICAL: 1.0000 "application " | TOPICAL | Status: DC | PRN

## 2015-03-07 MED ORDER — METOPROLOL TARTRATE 25 MG PO TABS
12.5000 mg | ORAL_TABLET | Freq: Two times a day (BID) | ORAL | Status: DC
  Administered 2015-03-07: 12.5 mg via ORAL
  Filled 2015-03-07: qty 1

## 2015-03-07 MED ORDER — METOPROLOL TARTRATE 1 MG/ML IV SOLN
5.0000 mg | Freq: Four times a day (QID) | INTRAVENOUS | Status: DC | PRN
  Administered 2015-03-07 – 2015-03-11 (×4): 5 mg via INTRAVENOUS
  Filled 2015-03-07 (×5): qty 5

## 2015-03-07 NOTE — Progress Notes (Signed)
Patient sinus tach at 143. Paged MD. Administered prn metoprolol.

## 2015-03-07 NOTE — Progress Notes (Signed)
Patient to transfer to tele per MD order. Report called to receiving RN.

## 2015-03-07 NOTE — Progress Notes (Signed)
Page sent to [facility] provider via [email or web address] in reference to patient elevated pulse that has progressively increased since admission and decreasing oxygen saturation.  Oxygen placed on at 1l via [place].

## 2015-03-07 NOTE — Progress Notes (Signed)
Patient Name: Terry Lyons Date of Encounter: 03/07/2015  Principal Problem:   Intractable nausea and vomiting Active Problems:   Essential hypertension, benign   Severe obesity (BMI >= 40) (HCC)   Abdominal pain   Uncontrolled diabetes mellitus type 2 without complications ([place])   Length of Stay: 1  SUBJECTIVE  Still feels poorly, unable to lie down since this worsens pain. The discomfort is low retrosternal and epigastric, but more intense in the mid epigastrium and towards the right flank. No true dyspnea, but taking shallower breaths due to the discomfort. No diagnostic clues from lipase, LFTs, RUQ [place], chest CTA and abdominal CT. Incidental note of coronary atherosclerotic calcifications, note recent low risk nuclear scan.  CURRENT MEDS . allopurinol  150 mg Oral QHS  . allopurinol  300 mg Oral Daily  . aspirin EC  81 mg Oral Daily  . calcium carbonate  1,500 mg Oral Daily  . doxazosin  2 mg Oral QHS  . insulin aspart  0-15 Units Subcutaneous TID WC  . insulin aspart  0-5 Units Subcutaneous QHS  . insulin aspart  3 Units Subcutaneous TID WC  . levothyroxine  50 mcg Oral QAC breakfast  . loratadine  10 mg Oral Daily  . metoprolol tartrate  12.5 mg Oral BID  . multivitamin with minerals  1 tablet Oral Daily  . omega-3 acid ethyl esters  1 g Oral Daily  . pantoprazole (PROTONIX) IV  40 mg Intravenous Q12H  . spironolactone  25 mg Oral Daily    OBJECTIVE  No intake or output data in the 24 hours ending 03/07/15 0952 Filed Weights   03/06/15 1554 03/07/15 0649  Weight: 310 lb 10.1 oz (140.9 kg) 311 lb 9.6 oz (141.341 kg)    PHYSICAL EXAM Filed Vitals:   03/06/15 1554 03/06/15 2110 03/07/15 0500 03/07/15 0649  BP: 132/69 145/77 154/68 154/68  Pulse: 106 114 142 137  Temp: 97.6 F (36.4 C) 98.8 F (37.1 C) 98.6 F (37 C) 98.6 F (37 C)  TempSrc: Oral Oral Oral Oral  Resp: 18 16 24 20   Height: 5\' 8"  (1.727 m)     Weight: 310 lb 10.1 oz (140.9 kg)   311 lb 9.6 oz  (141.341 kg)  SpO2: 92% 95% 92% 93%   General: Alert, oriented x3, no distress Head: no evidence of trauma, PERRL, EOMI, no exophtalmos or lid lag, no myxedema, no xanthelasma; normal ears, nose and oropharynx Neck: normal jugular venous pulsations and no hepatojugular reflux; brisk carotid pulses without delay and no carotid bruits Chest: clear to auscultation, no signs of consolidation by percussion or palpation, normal fremitus, symmetrical and full respiratory excursions Cardiovascular: normal position and quality of the apical impulse, regular rhythm, normal first and second heart sounds, no rubs or gallops, no murmur Abdomen: no tenderness or distention, no masses by palpation, no abnormal pulsatility or arterial bruits, normal bowel sounds, no hepatosplenomegaly Extremities: no clubbing, cyanosis or edema; 2+ radial, ulnar and brachial pulses bilaterally; 2+ right femoral, posterior tibial and dorsalis pedis pulses; 2+ left femoral, posterior tibial and dorsalis pedis pulses; no subclavian or femoral bruits Neurological: grossly nonfocal  LABS  CBC  Recent Labs  03/06/15 0810 03/07/15 0422  WBC 6.7 15.4*  HGB 14.2 13.9  HCT 42.3 43.0  MCV 93.6 97.1  PLT 218 [id number]   Basic Metabolic Panel  Recent Labs  03/06/15 0810 03/07/15 0422  NA 135 134*  K 4.8 4.6  CL 102 98*  CO2 22 24  GLUCOSE 214* 150*  BUN 17 21*  CREATININE 1.04 1.07  CALCIUM 9.3 8.7*   Liver Function Tests  Recent Labs  03/06/15 0810 03/07/15 0422  AST 49* 39  ALT 36 37  ALKPHOS 83 62  BILITOT 1.4* 1.2  PROT 6.8 7.1  ALBUMIN 3.6 3.7    Recent Labs  03/06/15 1222  LIPASE 59    Radiology Studies Imaging results have been reviewed and Ct Abdomen Pelvis Wo Contrast  03/06/2015  CLINICAL DATA:  70 year old male with abdominal pain since 0300 hours. Pain also in the center of the chest. Initial encounter. EXAM: CT ABDOMEN AND PELVIS WITHOUT CONTRAST TECHNIQUE: Multidetector CT imaging of the  abdomen and pelvis was performed following the standard protocol without IV contrast. COMPARISON:  Chest CTA 1012 hours today. Abdomen ultrasound 1101 hours today. FINDINGS: Stable visualized lung bases from earlier today, mild atelectasis and/or scarring. No pericardial or pleural effusion. Calcified thoracic aortic and coronary artery atherosclerosis. Advanced lumbar spine degeneration. No acute osseous abnormality identified. No pelvic free fluid. Excreted IV contrast in the renal collecting systems, ureters, and urinary bladder. Negative rectum. Redundant but otherwise negative sigmoid colon. Negative left colon, transverse colon, right colon and appendix. Negative terminal ileum. No dilated small bowel. Small volume of oral contrast in the small bowel. Decompressed stomach and duodenum. No abdominal free air or free fluid. Decreased density throughout the liver in keeping with hepatic steatosis. Noncontrast gallbladder, spleen, pancreas, and adrenal glands are within normal limits. Aortoiliac calcified atherosclerosis noted. No lymphadenopathy. No mesenteric stranding identified. IMPRESSION: 1. No acute or inflammatory process identified in the abdomen or pelvis. 2. Hepatic steatosis. 3. Calcified aortic and coronary artery atherosclerosis. 4. Excreted IV contrast in normal appearing renal collecting systems and urinary bladder. Electronically Signed   By: Genevie Ann M.D.   On: 03/06/2015 13:23   Dg Chest 2 View  03/06/2015  CLINICAL DATA:  Low chest pain EXAM: CHEST - 2 VIEW COMPARISON:  06/09/2008 FINDINGS: The heart size and mediastinal contours are within normal limits. Both lungs are clear. The visualized skeletal structures are unremarkable. IMPRESSION: No active disease. Electronically Signed   By: Inez Catalina M.D.   On: 03/06/2015 09:41   Ct Angio Chest Pe W/cm &/or Wo Cm  03/06/2015  CLINICAL DATA:  70 year old male with central chest pain radiating outward. Additional complaints include  indigestion and pain on deep inspiration. EXAM: CT ANGIOGRAPHY CHEST WITH CONTRAST TECHNIQUE: Multidetector CT imaging of the chest was performed using the standard protocol during bolus administration of intravenous contrast. Multiplanar CT image reconstructions and MIPs were obtained to evaluate the vascular anatomy. CONTRAST:  187mL OMNIPAQUE IOHEXOL 350 MG/ML SOLN COMPARISON:  Prior CTA chest 06/09/2008 FINDINGS: Mediastinum: Unremarkable CT appearance of the thyroid gland. No suspicious mediastinal or hilar adenopathy. No soft tissue mediastinal mass. The thoracic esophagus is unremarkable. Heart/Vascular: Relatively poor opacification of the pulmonary arteries beyond the segmental level. There is no evidence of focal filling defect to suggest acute pulmonary embolus. The heart is normal in size. No pericardial effusion. Calcifications present along the coronary arteries, including the left main and left anterior descending. Normal caliber thoracic aorta. Lungs/Pleura: Minimal atelectasis in the lower lobes and along the inferior aspect of the upper lobe along the major fissure. Tiny subpleural nodule within the minor fissure, unchanged dating back to 2010. No focal airspace consolidation, pulmonary edema, pleural effusion or pneumothorax. Bones/Soft Tissues: No acute fracture or aggressive appearing lytic or blastic osseous lesion. Upper Abdomen: Low attenuation of  the hepatic parenchyma with relative sparing around the gallbladder fossa consistent with hepatic steatosis. Otherwise, the visualized upper abdomen is unremarkable. Review of the MIP images confirms the above findings. IMPRESSION: 1. Negative for acute pulmonary embolus, pneumonia or other acute pulmonary process. 2. Mild dependent atelectasis versus scarring which is very similar in appearance compared to 06/09/2008. 3. Atherosclerosis including coronary artery calcifications. 4. Hepatic steatosis. Electronically Signed   By: Jacqulynn Cadet  M.D.   On: 03/06/2015 10:39   US Abdomen Limited Ruq  03/06/2015  CLINICAL DATA:  Right upper quadrant pain for several hours EXAM: US ABDOMEN LIMITED - RIGHT UPPER QUADRANT COMPARISON:  None. FINDINGS: Gallbladder: No gallstones or wall thickening visualized. No sonographic Murphy sign noted by sonographer. Common bile duct: Diameter: 4 mm Liver: Increase in echogenicity consistent with fatty infiltration. IMPRESSION: Fatty liver. No other focal abnormality is noted. Electronically Signed   By: Inez Catalina M.D.   On: 03/06/2015 11:33    TELE NSR/mild sinus tachycardia  ECG Sinus tachycardia, no St-T changes  ASSESSMENT AND PLAN  GERD and esophagitis are currently most likely source of symptoms (previous esophageal dilation, use of ASA and NSAIDs, no PPI Rx). Discomfort is clearly positional (worse lying down, better sitting up) and worsened by deep breathing (although pain is rather low in the epigastrium and right flank to be considered pleuritic). No rub or ECG changes to suggest pericarditis, but a limited echo is reasonable. Symptoms are not consistent with coronary insufficiency. If no evidence of pericarditis on limited echo, would not pursue further cardiac w/u as an inpatient.   Sanda Klein, MD, [facility] [phone] office [phone] pager 03/07/2015 9:52 AM

## 2015-03-07 NOTE — Progress Notes (Signed)
[facility] PROGRESS NOTE    Progress Note   Terry Lyons [id number] DOB: 1945-09-02 DOA: 03/06/2015 PCP: [name], MD   Brief Narrative:   Terry Lyons is an 70 y.o. male   Assessment/Plan:   Intractable nausea and vomiting and epigastric pain: Unlikely cardiac in etiology, cardiology was consulted and recommended gastrointestinal workup. Increase her Protonix to twice a day IV. Her hemoglobin drop from 15 in 2015 213 on the day of admission. She is on meloxicam home guaiac stools 3. He had an EGD on May 2016 and her see the dilation at that time he was not on Protonix at home. He has not been on Protonix at home, will add Mylanta continue clear liquid diet and monitor his hemoglobin. Increased Dilaudid Continue Zofran for nausea. Abdominal ultrasound and CT scan did not show signs of cholecystitis his alkaline phosphatase 63 bilirubin and AST and ALT are within normal range. Lipase was 33. Discussion does not show any inflammation of the colon or small intestine he has remained febrile with no diarrhea, which makes colitis or inflammatory bowel disease unlikely. CT scan of the abdomen and pelvis with IV contrast and is currently on metformin will continue monitor creatinine closely continue IV fluids aggressively as he is becoming intravascularly depleted specific gravity is 1046.  Essential hypertension, benign Continue metoprolol and Aldactone.  Hypothyroidism: Continue Synthroid.   Severe obesity (BMI >= 40) (HCC)  Uncontrolled diabetes mellitus type 2 without complications ([place]) Continue clear liquid diet. DC metformin start sliding scale insulin.    DVT Prophylaxis - Lovenox ordered.  Family Communication: wife Disposition Plan: Home 2-3 days Code Status:     Code Status Orders        Start     Ordered   03/06/15 1549  Full code   Continuous     03/06/15 1548    Code Status History    Date Active Date Inactive Code Status Order ID Comments  User Context   This patient has a current code status but no historical code status.    Advance Directive Documentation        Most Recent Value   Type of Advance Directive  Healthcare Power of Attorney, Living will   Pre-existing out of facility DNR order (yellow form or pink MOST form)     "MOST" Form in Place?          IV Access:    Peripheral IV   Procedures and diagnostic studies:   Ct Abdomen Pelvis Wo Contrast  03/06/2015  CLINICAL DATA:  70 year old male with abdominal pain since 0300 hours. Pain also in the center of the chest. Initial encounter. EXAM: CT ABDOMEN AND PELVIS WITHOUT CONTRAST TECHNIQUE: Multidetector CT imaging of the abdomen and pelvis was performed following the standard protocol without IV contrast. COMPARISON:  Chest CTA 1012 hours today. Abdomen ultrasound 1101 hours today. FINDINGS: Stable visualized lung bases from earlier today, mild atelectasis and/or scarring. No pericardial or pleural effusion. Calcified thoracic aortic and coronary artery atherosclerosis. Advanced lumbar spine degeneration. No acute osseous abnormality identified. No pelvic free fluid. Excreted IV contrast in the renal collecting systems, ureters, and urinary bladder. Negative rectum. Redundant but otherwise negative sigmoid colon. Negative left colon, transverse colon, right colon and appendix. Negative terminal ileum. No dilated small bowel. Small volume of oral contrast in the small bowel. Decompressed stomach and duodenum. No abdominal free air or free fluid. Decreased density throughout the liver in keeping with hepatic steatosis. Noncontrast gallbladder, spleen,  pancreas, and adrenal glands are within normal limits. Aortoiliac calcified atherosclerosis noted. No lymphadenopathy. No mesenteric stranding identified. IMPRESSION: 1. No acute or inflammatory process identified in the abdomen or pelvis. 2. Hepatic steatosis. 3. Calcified aortic and coronary artery atherosclerosis. 4. Excreted  IV contrast in normal appearing renal collecting systems and urinary bladder. Electronically Signed   By: Genevie Ann M.D.   On: 03/06/2015 13:23   Dg Chest 2 View  03/06/2015  CLINICAL DATA:  Low chest pain EXAM: CHEST - 2 VIEW COMPARISON:  06/09/2008 FINDINGS: The heart size and mediastinal contours are within normal limits. Both lungs are clear. The visualized skeletal structures are unremarkable. IMPRESSION: No active disease. Electronically Signed   By: Inez Catalina M.D.   On: 03/06/2015 09:41   Ct Angio Chest Pe W/cm &/or Wo Cm  03/06/2015  CLINICAL DATA:  70 year old male with central chest pain radiating outward. Additional complaints include indigestion and pain on deep inspiration. EXAM: CT ANGIOGRAPHY CHEST WITH CONTRAST TECHNIQUE: Multidetector CT imaging of the chest was performed using the standard protocol during bolus administration of intravenous contrast. Multiplanar CT image reconstructions and MIPs were obtained to evaluate the vascular anatomy. CONTRAST:  151mL OMNIPAQUE IOHEXOL 350 MG/ML SOLN COMPARISON:  Prior CTA chest 06/09/2008 FINDINGS: Mediastinum: Unremarkable CT appearance of the thyroid gland. No suspicious mediastinal or hilar adenopathy. No soft tissue mediastinal mass. The thoracic esophagus is unremarkable. Heart/Vascular: Relatively poor opacification of the pulmonary arteries beyond the segmental level. There is no evidence of focal filling defect to suggest acute pulmonary embolus. The heart is normal in size. No pericardial effusion. Calcifications present along the coronary arteries, including the left main and left anterior descending. Normal caliber thoracic aorta. Lungs/Pleura: Minimal atelectasis in the lower lobes and along the inferior aspect of the upper lobe along the major fissure. Tiny subpleural nodule within the minor fissure, unchanged dating back to 2010. No focal airspace consolidation, pulmonary edema, pleural effusion or pneumothorax. Bones/Soft Tissues: No  acute fracture or aggressive appearing lytic or blastic osseous lesion. Upper Abdomen: Low attenuation of the hepatic parenchyma with relative sparing around the gallbladder fossa consistent with hepatic steatosis. Otherwise, the visualized upper abdomen is unremarkable. Review of the MIP images confirms the above findings. IMPRESSION: 1. Negative for acute pulmonary embolus, pneumonia or other acute pulmonary process. 2. Mild dependent atelectasis versus scarring which is very similar in appearance compared to 06/09/2008. 3. Atherosclerosis including coronary artery calcifications. 4. Hepatic steatosis. Electronically Signed   By: Jacqulynn Cadet M.D.   On: 03/06/2015 10:39   US Abdomen Limited Ruq  03/06/2015  CLINICAL DATA:  Right upper quadrant pain for several hours EXAM: US ABDOMEN LIMITED - RIGHT UPPER QUADRANT COMPARISON:  None. FINDINGS: Gallbladder: No gallstones or wall thickening visualized. No sonographic Murphy sign noted by sonographer. Common bile duct: Diameter: 4 mm Liver: Increase in echogenicity consistent with fatty infiltration. IMPRESSION: Fatty liver. No other focal abnormality is noted. Electronically Signed   By: Inez Catalina M.D.   On: 03/06/2015 11:33     Medical Consultants:    None.  Anti-Infectives:   Anti-infectives    None      Subjective:    Terry Lyons still complaining of abdominal pain  Objective:    Filed Vitals:   03/06/15 1554 03/06/15 2110 03/07/15 0500 03/07/15 0649  BP: 132/69 145/77 154/68 154/68  Pulse: 106 114 142 137  Temp: 97.6 F (36.4 C) 98.8 F (37.1 C) 98.6 F (37 C) 98.6 F (37 C)  TempSrc: Oral Oral Oral Oral  Resp: 18 16 24 20   Height: 5\' 8"  (1.727 m)     Weight: 140.9 kg (310 lb 10.1 oz)   141.341 kg (311 lb 9.6 oz)  SpO2: 92% 95% 92% 93%   No intake or output data in the 24 hours ending 03/07/15 0820 Filed Weights   03/06/15 1554 03/07/15 0649  Weight: 140.9 kg (310 lb 10.1 oz) 141.341 kg (311 lb 9.6 oz)     Exam: Gen:  NAD Cardiovascular:  RRR. Chest and lungs:   CTAB Abdomen:  Abdomen soft, epigastric and right upper quadrant tenderness. Extremities:  No C/E/C   Data Reviewed:    Labs: Basic Metabolic Panel:  Recent Labs Lab 03/06/15 0810 03/07/15 0422  NA 135 134*  K 4.8 4.6  CL 102 98*  CO2 22 24  GLUCOSE 214* 150*  BUN 17 21*  CREATININE 1.04 1.07  CALCIUM 9.3 8.7*   GFR Estimated Creatinine Clearance: 87.4 mL/min (by C-G formula based on Cr of 1.07). Liver Function Tests:  Recent Labs Lab 03/06/15 0810 03/07/15 0422  AST 49* 39  ALT 36 37  ALKPHOS 83 62  BILITOT 1.4* 1.2  PROT 6.8 7.1  ALBUMIN 3.6 3.7    Recent Labs Lab 03/06/15 1222  LIPASE 33   No results for input(s): AMMONIA in the last 168 hours. Coagulation profile No results for input(s): INR, PROTIME in the last 168 hours.  CBC:  Recent Labs Lab 03/06/15 0810 03/07/15 0422  WBC 6.7 15.4*  HGB 14.2 13.9  HCT 42.3 43.0  MCV 93.6 97.1  PLT 218 200   Cardiac Enzymes: No results for input(s): CKTOTAL, CKMB, CKMBINDEX, TROPONINI in the last 168 hours. BNP (last 3 results) No results for input(s): PROBNP in the last 8760 hours. CBG:  Recent Labs Lab 03/07/15 0741  GLUCAP 146*   D-Dimer: No results for input(s): DDIMER in the last 72 hours. Hgb A1c: No results for input(s): HGBA1C in the last 72 hours. Lipid Profile: No results for input(s): CHOL, HDL, LDLCALC, TRIG, CHOLHDL, LDLDIRECT in the last 72 hours. Thyroid function studies: No results for input(s): TSH, T4TOTAL, T3FREE, THYROIDAB in the last 72 hours.  Invalid input(s): FREET3 Anemia work up: No results for input(s): VITAMINB12, FOLATE, FERRITIN, TIBC, IRON, RETICCTPCT in the last 72 hours. Sepsis Labs:  Recent Labs Lab 03/06/15 0810 03/07/15 0422  WBC 6.7 15.4*   Microbiology No results found for this or any previous visit (from the past 240 hour(s)).   Medications:   . allopurinol  150 mg Oral QHS   . allopurinol  300 mg Oral Daily  . aspirin EC  81 mg Oral Daily  . calcium carbonate  1,500 mg Oral Daily  . doxazosin  2 mg Oral QHS  . levothyroxine  50 mcg Oral QAC breakfast  . loratadine  10 mg Oral Daily  . meloxicam  7.5 mg Oral Daily  . metFORMIN  500 mg Oral Q breakfast  . metoprolol tartrate  12.5 mg Oral BID  . multivitamin with minerals  1 tablet Oral Daily  . omega-3 acid ethyl esters  1 g Oral Daily  . pantoprazole  40 mg Oral Q breakfast  . pantoprazole (PROTONIX) IV  40 mg Intravenous Q12H  . spironolactone  25 mg Oral Daily   Continuous Infusions: . sodium chloride 75 mL/hr at 03/07/15 0636    Time spent: 25 min   LOS: 1 day   FELIZ Marguarite Arbour  [facility] Pager  [phone]  *Please refer to [email or web address], password [email or web address] to get updated schedule on who will round on this patient, as hospitalists switch teams weekly. If 7PM-7AM, please contact night-coverage at [email or web address], password [email or web address] for any overnight needs.  03/07/2015, 8:20 AM

## 2015-03-07 NOTE — Progress Notes (Signed)
*  PRELIMINARY RESULTS* Echocardiogram 2D Echocardiogram has been performed.  Terry Lyons 03/07/2015, 12:10 PM

## 2015-03-07 NOTE — Progress Notes (Signed)
Pt called me to his bathroom to view 5 drops of blood on patient underwear.  Patient states that he has had this happen a 3-4 times in his life.  Patient denies and genital pain or trauma.  No s/s of skin opening as source of blood stains to underwear.  Patient bladder scanned after complaint of minimal urination during day with reading of 70mls post void.

## 2015-03-07 NOTE — Progress Notes (Signed)
Page sent to [facility] provider via [email or web address] in reference to concerns stemming from blood stains on underwear.

## 2015-03-08 ENCOUNTER — Inpatient Hospital Stay (HOSPITAL_COMMUNITY): Payer: Non-veteran care

## 2015-03-08 DIAGNOSIS — I10 Essential (primary) hypertension: Secondary | ICD-10-CM | POA: Diagnosis not present

## 2015-03-08 DIAGNOSIS — R1013 Epigastric pain: Secondary | ICD-10-CM | POA: Diagnosis not present

## 2015-03-08 DIAGNOSIS — J9811 Atelectasis: Secondary | ICD-10-CM | POA: Diagnosis not present

## 2015-03-08 DIAGNOSIS — R109 Unspecified abdominal pain: Secondary | ICD-10-CM | POA: Diagnosis not present

## 2015-03-08 DIAGNOSIS — R071 Chest pain on breathing: Secondary | ICD-10-CM | POA: Diagnosis not present

## 2015-03-08 DIAGNOSIS — R0902 Hypoxemia: Secondary | ICD-10-CM | POA: Diagnosis present

## 2015-03-08 DIAGNOSIS — G43A1 Cyclical vomiting, intractable: Secondary | ICD-10-CM | POA: Diagnosis not present

## 2015-03-08 DIAGNOSIS — R000 Tachycardia, unspecified: Secondary | ICD-10-CM

## 2015-03-08 LAB — BASIC METABOLIC PANEL
ANION GAP: 8 (ref 5–15)
Anion gap: 10 (ref 5–15)
BUN: 31 mg/dL — ABNORMAL HIGH (ref 6–20)
BUN: 21 mg/dL — AB (ref 6–20)
CALCIUM: 9 mg/dL (ref 8.9–10.3)
CALCIUM: 8.9 mg/dL (ref 8.9–10.3)
CHLORIDE: 100 mmol/L — AB (ref 101–111)
CO2: 26 mmol/L (ref 22–32)
CO2: 26 mmol/L (ref 22–32)
CREATININE: 1.38 mg/dL — AB (ref 0.61–1.24)
CREATININE: 1.17 mg/dL (ref 0.61–1.24)
Chloride: 96 mmol/L — ABNORMAL LOW (ref 101–111)
GFR calc Af Amer: >60 mL/min (ref >60)
GFR calc non Af Amer: 50 mL/min — ABNORMAL LOW (ref >60)
GFR, EST AFRICAN AMERICAN: 58 mL/min — AB (ref >60)
GLUCOSE: 156 mg/dL — AB (ref 65–99)
Glucose, Bld: 155 mg/dL — ABNORMAL HIGH (ref 65–99)
POTASSIUM: 5.1 mmol/L (ref 3.5–5.1)
Potassium: 5.2 mmol/L — ABNORMAL HIGH (ref 3.5–5.1)
SODIUM: 134 mmol/L — AB (ref 135–145)
SODIUM: 132 mmol/L — AB (ref 135–145)

## 2015-03-08 LAB — GLUCOSE, CAPILLARY
GLUCOSE-CAPILLARY: 161 mg/dL — AB (ref 65–99)
GLUCOSE-CAPILLARY: 150 mg/dL — AB (ref 65–99)
GLUCOSE-CAPILLARY: 145 mg/dL — AB (ref 65–99)
Glucose-Capillary: 156 mg/dL — ABNORMAL HIGH (ref 65–99)

## 2015-03-08 LAB — CBC
HCT: 42.4 % (ref 39.0–52.0)
Hemoglobin: 13.6 g/dL (ref 13.0–17.0)
MCH: 31.1 pg (ref 26.0–34.0)
MCHC: 32.1 g/dL (ref 30.0–36.0)
MCV: 97 fL (ref 78.0–100.0)
PLATELETS: 185 10*3/uL (ref 150–400)
RBC: 4.37 MIL/uL (ref 4.22–5.81)
RDW: 15.4 % (ref 11.5–15.5)
WBC: 18 10*3/uL — ABNORMAL HIGH (ref 4.0–10.5)

## 2015-03-08 LAB — URINALYSIS, ROUTINE W REFLEX MICROSCOPIC
BILIRUBIN URINE: NEGATIVE
GLUCOSE, UA: NEGATIVE mg/dL
KETONES UR: NEGATIVE mg/dL
Leukocytes, UA: NEGATIVE
Nitrite: NEGATIVE
PH: 5 (ref 5.0–8.0)
PROTEIN: 30 mg/dL — AB
Specific Gravity, Urine: 1.02 (ref 1.005–1.030)

## 2015-03-08 LAB — INFLUENZA PANEL BY PCR (TYPE A & B)
H1N1 flu by pcr: NOT DETECTED
INFLAPCR: NEGATIVE
INFLBPCR: NEGATIVE

## 2015-03-08 LAB — HEMOGLOBIN A1C
HEMOGLOBIN A1C: 7.1 % — AB (ref 4.8–5.6)
MEAN PLASMA GLUCOSE: 157 mg/dL

## 2015-03-08 LAB — URINE MICROSCOPIC-ADD ON

## 2015-03-08 MED ORDER — MAGNESIUM CITRATE PO SOLN
1.0000 | Freq: Once | ORAL | Status: AC
  Administered 2015-03-08: 1 via ORAL

## 2015-03-08 MED ORDER — LEVALBUTEROL HCL 0.63 MG/3ML IN NEBU
0.6300 mg | INHALATION_SOLUTION | Freq: Four times a day (QID) | RESPIRATORY_TRACT | Status: DC | PRN
  Administered 2015-03-08: 0.63 mg via RESPIRATORY_TRACT
  Filled 2015-03-08: qty 3

## 2015-03-08 MED ORDER — IBUPROFEN 400 MG PO TABS
400.0000 mg | ORAL_TABLET | Freq: Once | ORAL | Status: DC
  Filled 2015-03-08: qty 1

## 2015-03-08 MED ORDER — CALCIUM CARBONATE 1250 (500 CA) MG PO TABS
1.0000 | ORAL_TABLET | Freq: Every day | ORAL | Status: DC
  Administered 2015-03-08 – 2015-03-13 (×5): 500 mg via ORAL
  Filled 2015-03-08 (×6): qty 1

## 2015-03-08 MED ORDER — LEVOFLOXACIN IN D5W 750 MG/150ML IV SOLN
750.0000 mg | INTRAVENOUS | Status: AC
  Administered 2015-03-08: 750 mg via INTRAVENOUS
  Filled 2015-03-08: qty 150

## 2015-03-08 MED ORDER — ACETAMINOPHEN 325 MG PO TABS
650.0000 mg | ORAL_TABLET | Freq: Four times a day (QID) | ORAL | Status: DC | PRN
  Administered 2015-03-08: 650 mg via ORAL
  Filled 2015-03-08: qty 2

## 2015-03-08 MED ORDER — BISACODYL 10 MG RE SUPP
10.0000 mg | Freq: Every day | RECTAL | Status: DC
  Administered 2015-03-08 – 2015-03-13 (×6): 10 mg via RECTAL
  Filled 2015-03-08 (×6): qty 1

## 2015-03-08 MED ORDER — MORPHINE SULFATE (PF) 2 MG/ML IV SOLN
2.0000 mg | INTRAVENOUS | Status: DC | PRN
  Administered 2015-03-08 – 2015-03-10 (×8): 2 mg via INTRAVENOUS
  Filled 2015-03-08 (×9): qty 1

## 2015-03-08 MED ORDER — LEVOFLOXACIN IN D5W 750 MG/150ML IV SOLN
750.0000 mg | INTRAVENOUS | Status: AC
  Administered 2015-03-09 – 2015-03-12 (×4): 750 mg via INTRAVENOUS
  Filled 2015-03-08 (×4): qty 150

## 2015-03-08 MED ORDER — SIMETHICONE 80 MG PO CHEW
160.0000 mg | CHEWABLE_TABLET | Freq: Three times a day (TID) | ORAL | Status: DC
  Administered 2015-03-08 – 2015-03-13 (×15): 160 mg via ORAL
  Filled 2015-03-08 (×16): qty 2

## 2015-03-08 MED ORDER — SODIUM CHLORIDE 0.9 % IV BOLUS (SEPSIS)
500.0000 mL | Freq: Once | INTRAVENOUS | Status: AC
  Administered 2015-03-08: 500 mL via INTRAVENOUS

## 2015-03-08 MED ORDER — HYDROMORPHONE HCL 1 MG/ML IJ SOLN
1.0000 mg | INTRAMUSCULAR | Status: DC | PRN
  Administered 2015-03-08 – 2015-03-12 (×8): 1 mg via INTRAVENOUS
  Filled 2015-03-08 (×8): qty 1

## 2015-03-08 MED ORDER — SIMETHICONE 80 MG PO CHEW: 160.0000 mg | CHEWABLE_TABLET | Freq: Four times a day (QID) | ORAL | Status: DC

## 2015-03-08 NOTE — Progress Notes (Addendum)
Noted increase in HR on tele box to 140, vital signs checked BP 121/69, HR 135, temp 100.1. PRN metropolol given for HR, paged on-call about rise in temp. PRN order given for tylenol and flu swab, placed on droplet precautions. Will continue to monitor and reassess.

## 2015-03-08 NOTE — Progress Notes (Signed)
Patient Profile: 70 y.o. male with a past medical history significant for DM, hypertension, hyperlipidemia and a strong family history of premature CAD and low risk NST in 2015, admitted with CP/ severe epigastric discomfort radiating to the retrosternal area, characterized as sharp, pleuritic and positional.   Subjective: Currently pain free. Felt feverish overnight.   Objective: Vital signs in last 24 hours: Temp:  [97.9 F (36.6 C)-100.1 F (37.8 C)] 100 F (37.8 C) (01/23 0601) Pulse Rate:  [116-135] 120 (01/23 0445) Resp:  [20-22] 22 (01/23 0413) BP: (120-152)/(69-104) 121/69 mmHg (01/23 0413) SpO2:  [91 %-96 %] 94 % (01/23 0413) Weight:  [311 lb (141.069 kg)] 311 lb (141.069 kg) (01/23 0500) Last BM Date: 03/05/15  Intake/Output from previous day: 01/22 0701 - 01/23 0700 In: 1678.7 [P.O.:120; I.V.:1558.7] Out: 425 [Urine:425] Intake/Output this shift:    Medications Current Facility-Administered Medications  Medication Dose Route Frequency Provider Last Rate Last Dose  . 0.9 %  sodium chloride infusion   Intravenous Continuous Charlynne Cousins, MD 100 mL/hr at 03/08/15 0315    . acetaminophen (TYLENOL) tablet 650 mg  650 mg Oral Q6H PRN Dianne Dun, NP   650 mg at 03/08/15 0445  . allopurinol (ZYLOPRIM) tablet 150 mg  150 mg Oral QHS Robbie Lis, MD   150 mg at 03/07/15 2355  . allopurinol (ZYLOPRIM) tablet 300 mg  300 mg Oral Daily Robbie Lis, MD   300 mg at 03/07/15 1032  . alum & mag hydroxide-simeth (MAALOX/MYLANTA) 200-200-20 MG/5ML suspension 15 mL  15 mL Oral PRN Charlynne Cousins, MD      . aspirin EC tablet 81 mg  81 mg Oral Daily Robbie Lis, MD   81 mg at 03/07/15 1032  . calcium carbonate (OSCAL) tablet 1,500 mg  1,500 mg Oral Daily Robbie Lis, MD   1,500 mg at 03/07/15 1000  . doxazosin (CARDURA) tablet 2 mg  2 mg Oral QHS Robbie Lis, MD   2 mg at 03/07/15 2355  . HYDROmorphone (DILAUDID) injection 1 mg  1 mg Intravenous Q2H  PRN Dianne Dun, NP   1 mg at 03/08/15 0325  . ibuprofen (ADVIL,MOTRIN) tablet 400 mg  400 mg Oral Once Dianne Dun, NP   400 mg at 03/08/15 [id number]  . insulin aspart (novoLOG) injection 0-15 Units  0-15 Units Subcutaneous TID WC Charlynne Cousins, MD   3 Units at 03/07/15 1700  . insulin aspart (novoLOG) injection 0-5 Units  0-5 Units Subcutaneous QHS Charlynne Cousins, MD   0 Units at 03/07/15 2338  . insulin aspart (novoLOG) injection 3 Units  3 Units Subcutaneous TID WC Charlynne Cousins, MD   3 Units at 03/07/15 1819  . levothyroxine (SYNTHROID, LEVOTHROID) tablet 50 mcg  50 mcg Oral QAC breakfast Robbie Lis, MD   50 mcg at 03/07/15 1032  . loratadine (CLARITIN) tablet 10 mg  10 mg Oral Daily Robbie Lis, MD   10 mg at 03/07/15 1032  . metoprolol (LOPRESSOR) injection 5 mg  5 mg Intravenous Q6H PRN Charlynne Cousins, MD   5 mg at 03/08/15 0415  . metoprolol tartrate (LOPRESSOR) tablet 12.5 mg  12.5 mg Oral BID Charlynne Cousins, MD   12.5 mg at 03/07/15 1951  . multivitamin with minerals tablet 1 tablet  1 tablet Oral Daily Robbie Lis, MD   1 tablet at 03/07/15 1032  . omega-3 acid ethyl esters (LOVAZA) capsule  1 g  1 g Oral Daily Robbie Lis, MD   1 g at 03/07/15 1031  . ondansetron (ZOFRAN) tablet 4 mg  4 mg Oral Q6H PRN Robbie Lis, MD       Or  . ondansetron [facility]) injection 4 mg  4 mg Intravenous Q6H PRN Robbie Lis, MD   4 mg at 03/06/15 1515  . oxyCODONE (Oxy IR/ROXICODONE) immediate release tablet 5-10 mg  5-10 mg Oral Q6H PRN Dianne Dun, NP   10 mg at 03/07/15 2216  . pantoprazole (PROTONIX) injection 40 mg  40 mg Intravenous Q12H Charlynne Cousins, MD   40 mg at 03/07/15 2356  . promethazine (PHENERGAN) injection 12.5 mg  12.5 mg Intravenous Q6H PRN Robbie Lis, MD   12.5 mg at 03/07/15 1312    PE: General appearance: alert, cooperative, no distress and morbidly obese Neck: no carotid bruit and no JVD Lungs: right  sided expiratory wheezing Heart: regular rhytyhm. tachy rate Extremities: no LEE Pulses: 2+ and symmetric Skin: warm and dry Neurologic: Grossly normal  Lab Results:   Recent Labs  03/06/15 0810 03/07/15 0422  WBC 6.7 15.4*  HGB 14.2 13.9  HCT 42.3 43.0  PLT 218 200   BMET  Recent Labs  03/06/15 0810 03/07/15 0422 03/08/15 0542  NA 135 134* 134*  K 4.8 4.6 5.2*  CL 102 98* 100*  CO2 22 24 26   GLUCOSE 214* 150* 155*  BUN 17 21* 31*  CREATININE 1.04 1.07 1.38*  CALCIUM 9.3 8.7* 9.0   Studies/Results: 2D Echo  Study Conclusions  - Left ventricle: The cavity size was normal. There was mild concentric hypertrophy. Systolic function was vigorous. The estimated ejection fraction was in the range of 65% to 70%. Wall motion was normal; there were no regional wall motion abnormalities. Due to tachycardia, there was fusion of early and atrial contributions to ventricular filling. The study is not technically sufficient to allow evaluation of LV diastolic function. - Aortic valve: There was mild stenosis. Valve area (VTI): 1.99 cm^2. Valve area (Vmax): 1.79 cm^2. Valve area (Vmean): 1.77 cm^2. - Mitral valve: Calcified annulus. Valve area by continuity equation (using LVOT flow): 3.14 cm^2. - Left atrium: The atrium was mildly dilated. - Right ventricle: Systolic function was hyperdynamic.  Pericardium: There was no pericardial effusion.  Assessment/Plan  Principal Problem:   Intractable nausea and vomiting Active Problems:   Essential hypertension, benign   Severe obesity (BMI >= 40) (HCC)   Abdominal pain   Uncontrolled diabetes mellitus type 2 without complications (HCC)   Chest pain on breathing   1. Chest/ Epigastric Pain: Chest CT negative for PE, PNA or other acute process. Abdominal CT and Ultrasound also negative. Lipase normal. EKGs nonischemic. Troponin negative. 2D echo normal with normal EF of 65-70%, normal wall motion and no  pericardial abnormality. No rub on exam to suggest pericarditis. Consider GERD and esophagitis as possible etiologies (previous esophageal dilation, use of ASA and NSAIDs, no PPI Rx). No further cardiac w/u indicated. Call if any additional problems that need to be addressed.    2. Sinus Tach: patient in sinus tach on telemetry with rates in the 100s-120s. ? Infectious process with rise in WBC from 6 to 15.4.  Currently afebrile. He may be a bit dry with increase in SCr from 1.07 to 1.38 and BUN from 21 to 31. Continue IV hydration.  Continue PO metoprolol. IM to follow and manage leukocytosis.    LOS: 2  days    Brittainy M. Ladoris Gene 03/08/2015 7:53 AM  I have seen, examined and evaluated the patient this PM along with Ms. Rosita Fire, PA-C.  After reviewing all the available data and chart,  I agree with her findings, examination as well as impression recommendations.  HR improved after IVF. Echo normal -no effusion.  Pain is mostly epigastric -- with increased WBC &low grade Fever - CRO infectiously etiology for tachycardia.  Will sign off for now.  Call with ?s.     Leonie Man, M.D., M.S. Interventional Cardiologist   Pager # [phone] Phone # [phone] [address] [place], [place] [address]

## 2015-03-08 NOTE — Progress Notes (Signed)
[facility] PROGRESS NOTE    Progress Note   Terry Lyons [id number] DOB: 18-Nov-1945 DOA: 03/06/2015 PCP: [name], MD   Brief Narrative:   Terry Lyons is an 71 y.o. male   Assessment/Plan:   Intractable nausea and vomiting and epigastric pain: Unlikely cardiac in etiology, cardiology was consulted and recommended gastrointestinal workup. Increase her Protonix to twice a day IV. Her hemoglobin drop from 15 in 2015 213 on the day of admission. - on meloxicam home guaiac stools 3. He had an EGD on May 2016  - abdomen quite distended today- Xray reveals possible ileus- no vomiting-   Cough/ fever/ leukocytosis/ hypoxia - requiring 2 L O2- CXR reveals significant atelectasis bilaterally  - may be developing a pneumonia which is not showing up on the CXR due to dehydration - has pain in right lower rib cage with coughing and deep breaths - will start Levaquin today  Severe dehydration - fluid bolus- continuous IVF- follow I and O  Essential hypertension, benign Continue metoprolol    Hypothyroidism: Continue Synthroid.   Severe obesity (BMI >= 40) (HCC)  Uncontrolled diabetes mellitus type 2 without complications (HCC) - sliding scale insulin.    DVT Prophylaxis - Lovenox ordered.  Family Communication: wife Disposition Plan: Home 2-3 days Code Status:     Code Status Orders        Start     Ordered   03/06/15 1549  Full code   Continuous     03/06/15 1548    Code Status History    Date Active Date Inactive Code Status Order ID Comments User Context   This patient has a current code status but no historical code status.    Advance Directive Documentation        Most Recent Value   Type of Advance Directive  Healthcare Power of Attorney, Living will   Pre-existing out of facility DNR order (yellow form or pink MOST form)     "MOST" Form in Place?          IV Access:    Peripheral IV   Procedures and diagnostic studies:   Dg Chest  Port 1 View  03/08/2015  CLINICAL DATA:  Acute hypoxemia. EXAM: PORTABLE CHEST 1 VIEW COMPARISON:  Two-view chest x-ray and CTA chest 03/06/2015. FINDINGS: Markedly suboptimal inspiration which accounts for atelectasis in the lung bases, progressive since the examinations 2 days ago. Cardiac silhouette upper normal in size for technique and degree of inspiration. Normal pulmonary vascularity. No visible pleural effusions. IMPRESSION: Markedly suboptimal inspiration accounts for bibasilar atelectasis, progressive since the examinations 2 days ago Electronically Signed   By: Evangeline Dakin M.D.   On: 03/08/2015 12:28   Dg Abd Portable 1v  03/08/2015  CLINICAL DATA:  Abdominal pain across upper abdomen. EXAM: PORTABLE ABDOMEN - 1 VIEW COMPARISON:  CT scan from 03/06/2015. FINDINGS: Supine view the abdomen shows gaseous distension of colon and small bowel in the mid abdomen. Study is limited by portable technique. IMPRESSION: Mild gaseous distention of small bowel and colon. A component of ileus is not excluded. Electronically Signed   By: Misty Stanley M.D.   On: 03/08/2015 11:52     Medical Consultants:    None.  Anti-Infectives:   Anti-infectives    None      Subjective:    Terry Lyons still complaining of abdominal pain  Objective:    Filed Vitals:   03/08/15 0500 03/08/15 0601 03/08/15 0800 03/08/15 1434  BP:  133/63  Pulse:    118  Temp:  100 F (37.8 C) 98.6 F (37 C) 99.2 F (37.3 C)  TempSrc:  Oral Oral Oral  Resp:    22  Height:      Weight: 141.069 kg (311 lb)     SpO2:    96%    Intake/Output Summary (Last 24 hours) at 03/08/15 1535 Last data filed at 03/08/15 1500  Gross per 24 hour  Intake 1996.66 ml  Output    900 ml  Net 1096.66 ml   Filed Weights   03/06/15 1554 03/07/15 0649 03/08/15 0500  Weight: 140.9 kg (310 lb 10.1 oz) 141.341 kg (311 lb 9.6 oz) 141.069 kg (311 lb)    Exam: Gen:  NAD Cardiovascular:  RRR. Chest and lungs:   CTAB Abdomen:   Abdomen soft, epigastric and right upper quadrant tenderness, significant distension, tympanic to percussion . Extremities:  No C/E/C   Data Reviewed:    Labs: Basic Metabolic Panel:  Recent Labs Lab 03/06/15 0810 03/07/15 0422 03/08/15 0542  NA 135 134* 134*  K 4.8 4.6 5.2*  CL 102 98* 100*  CO2 22 24 26   GLUCOSE 214* 150* 155*  BUN 17 21* 31*  CREATININE 1.04 1.07 1.38*  CALCIUM 9.3 8.7* 9.0   GFR Estimated Creatinine Clearance: 67.7 mL/min (by C-G formula based on Cr of 1.38). Liver Function Tests:  Recent Labs Lab 03/06/15 0810 03/07/15 0422  AST 49* 39  ALT 36 37  ALKPHOS 83 62  BILITOT 1.4* 1.2  PROT 6.8 7.1  ALBUMIN 3.6 3.7    Recent Labs Lab 03/06/15 1222  LIPASE 33   No results for input(s): AMMONIA in the last 168 hours. Coagulation profile No results for input(s): INR, PROTIME in the last 168 hours.  CBC:  Recent Labs Lab 03/06/15 0810 03/07/15 0422 03/08/15 0542  WBC 6.7 15.4* 18.0*  HGB 14.2 13.9 13.6  HCT 42.3 43.0 42.4  MCV 93.6 97.1 97.0  PLT 218 200 185   Cardiac Enzymes: No results for input(s): CKTOTAL, CKMB, CKMBINDEX, TROPONINI in the last 168 hours. BNP (last 3 results) No results for input(s): PROBNP in the last 8760 hours. CBG:  Recent Labs Lab 03/07/15 1217 03/07/15 1742 03/07/15 2138 03/08/15 0747 03/08/15 1153  GLUCAP 175* 159* 144* 156* 161*   D-Dimer: No results for input(s): DDIMER in the last 72 hours. Hgb A1c:  Recent Labs  03/06/15 0810  HGBA1C 7.1*   Lipid Profile: No results for input(s): CHOL, HDL, LDLCALC, TRIG, CHOLHDL, LDLDIRECT in the last 72 hours. Thyroid function studies: No results for input(s): TSH, T4TOTAL, T3FREE, THYROIDAB in the last 72 hours.  Invalid input(s): FREET3 Anemia work up: No results for input(s): VITAMINB12, FOLATE, FERRITIN, TIBC, IRON, RETICCTPCT in the last 72 hours. Sepsis Labs:  Recent Labs Lab 03/06/15 0810 03/07/15 0422 03/08/15 0542  WBC 6.7 15.4*  18.0*   Microbiology No results found for this or any previous visit (from the past 240 hour(s)).   Medications:   . allopurinol  150 mg Oral QHS  . allopurinol  300 mg Oral Daily  . aspirin EC  81 mg Oral Daily  . bisacodyl  10 mg Rectal Daily  . calcium carbonate  1 tablet Oral Q breakfast  . doxazosin  2 mg Oral QHS  . ibuprofen  400 mg Oral Once  . insulin aspart  0-15 Units Subcutaneous TID WC  . insulin aspart  0-5 Units Subcutaneous QHS  . insulin aspart  3 Units Subcutaneous TID WC  . levothyroxine  50 mcg Oral QAC breakfast  . loratadine  10 mg Oral Daily  . metoprolol tartrate  12.5 mg Oral BID  . multivitamin with minerals  1 tablet Oral Daily  . omega-3 acid ethyl esters  1 g Oral Daily  . pantoprazole (PROTONIX) IV  40 mg Intravenous Q12H  . simethicone  160 mg Oral TID   Continuous Infusions: . sodium chloride 100 mL/hr at 03/08/15 0315    Time spent: 25 min   LOS: 2 days   Debbe Odea, MD [facility] Pager [phone]  *Please refer to [email or web address], password [email or web address] to get updated schedule on who will round on this patient, as hospitalists switch teams weekly. If 7PM-7AM, please contact night-coverage at [email or web address], password [email or web address] for any overnight needs.  03/08/2015, 3:35 PM

## 2015-03-08 NOTE — Progress Notes (Signed)
Temperature still 100.0 1 hour after tylenol administration, paged on-call and ordered ibuprofen. Pt refused ibuprofen due to potential GI issues. Will pass on to day shift nurse and continue to monitor.

## 2015-03-08 NOTE — Progress Notes (Signed)
ANTIBIOTIC CONSULT NOTE - INITIAL  Pharmacy Consult for Levaquin Indication: CAP  Allergies  Allergen Reactions  . Bupropion Hcl     REACTION: insomnia  . Codeine     REACTION: nausea  . Escitalopram Oxalate     REACTION: insomnia  . Sertraline Hcl     REACTION: insomnia  . Sulfa Antibiotics     Pt can not remember  . Sulfur Other (See Comments)    Patient Measurements: Height: 5\' 8"  (172.7 cm) Weight: (!) 311 lb (141.069 kg) IBW/kg (Calculated) : 68.4 Adjusted Body Weight:   Vital Signs: Temp: 99.2 F (37.3 C) (01/23 1434) Temp Source: Oral (01/23 1434) BP: 133/63 mmHg (01/23 1434) Pulse Rate: 118 (01/23 1434) Intake/Output from previous day: 01/22 0701 - 01/23 0700 In: 1678.7 [P.O.:120; I.V.:1558.7] Out: 425 [Urine:425] Intake/Output from this shift: Total I/O In: 1040 [P.O.:240; I.V.:800] Out: 600 [Urine:600]  Labs:  Recent Labs  03/06/15 0810 03/07/15 0422 03/08/15 0542  WBC 6.7 15.4* 18.0*  HGB 14.2 13.9 13.6  PLT 218 200 185  CREATININE 1.04 1.07 1.38*   Estimated Creatinine Clearance: 67.7 mL/min (by C-G formula based on Cr of 1.38). No results for input(s): VANCOTROUGH, VANCOPEAK, VANCORANDOM, GENTTROUGH, GENTPEAK, GENTRANDOM, TOBRATROUGH, TOBRAPEAK, TOBRARND, AMIKACINPEAK, AMIKACINTROU, AMIKACIN in the last 72 hours.   Microbiology: No results found for this or any previous visit (from the past 720 hour(s)).  Medical History: Past Medical History  Diagnosis Date  . GERD (gastroesophageal reflux disease)   . Gout   . History of basal cell carcinoma     W-S Derm  . History of squamous cell carcinoma   . Hyperlipidemia   . Tachycardia   . Hypertension   . Allergy   . Rosacea   . Diabetes mellitus   . Arthritis   . Obesity    Assessment: 14 yoM with PMHx obesity, diabetes, HTN, HLD, and hypothyroidism admitted on 1/21 with chief complaint of abdominal and epigastric pain radiating to chest with intractable nausea and vomiting.  Cardiology consulted, note unlikely cardiac in etiology and recommend GI work-up.  Pt now noted with tachycardia, cough, fever, and leukocytosis and pharmacy consulted to start levaquin for CAP.   Anti-infectives 1/23 >> levaquin  >>  Vitals/Labs WBC: Rising, now 18K Tm24h: 100 Renal: SCr bumped up, 1.38, CrCl 68 ml/min (N49) - noted BMI 47  Cultures 1/23 Influenza panel: Negative  Goal of Therapy:  Eradication of infection  Plan:  D1 antibiotics Levaquin 750mg  IV x 5 days F/u renal fxn closely and adjust dose if SCr continues trending up F/u change to PO when improving clinically, tolerating POs/diet  Ralene Bathe, PharmD, BCPS  03/08/2015, 4:15 PM  Pager: [phone]

## 2015-03-09 ENCOUNTER — Inpatient Hospital Stay (HOSPITAL_COMMUNITY): Payer: Non-veteran care

## 2015-03-09 DIAGNOSIS — R1110 Vomiting, unspecified: Secondary | ICD-10-CM | POA: Diagnosis not present

## 2015-03-09 DIAGNOSIS — R1013 Epigastric pain: Secondary | ICD-10-CM

## 2015-03-09 DIAGNOSIS — D649 Anemia, unspecified: Secondary | ICD-10-CM | POA: Diagnosis not present

## 2015-03-09 DIAGNOSIS — I110 Hypertensive heart disease with heart failure: Secondary | ICD-10-CM | POA: Diagnosis not present

## 2015-03-09 DIAGNOSIS — R112 Nausea with vomiting, unspecified: Secondary | ICD-10-CM | POA: Diagnosis not present

## 2015-03-09 DIAGNOSIS — E1165 Type 2 diabetes mellitus with hyperglycemia: Secondary | ICD-10-CM | POA: Diagnosis not present

## 2015-03-09 DIAGNOSIS — R109 Unspecified abdominal pain: Secondary | ICD-10-CM | POA: Diagnosis not present

## 2015-03-09 DIAGNOSIS — R933 Abnormal findings on diagnostic imaging of other parts of digestive tract: Secondary | ICD-10-CM

## 2015-03-09 DIAGNOSIS — R1084 Generalized abdominal pain: Secondary | ICD-10-CM

## 2015-03-09 DIAGNOSIS — G43A1 Cyclical vomiting, intractable: Secondary | ICD-10-CM | POA: Diagnosis not present

## 2015-03-09 DIAGNOSIS — I10 Essential (primary) hypertension: Secondary | ICD-10-CM | POA: Diagnosis not present

## 2015-03-09 DIAGNOSIS — R1011 Right upper quadrant pain: Secondary | ICD-10-CM | POA: Diagnosis not present

## 2015-03-09 DIAGNOSIS — I5031 Acute diastolic (congestive) heart failure: Secondary | ICD-10-CM | POA: Diagnosis not present

## 2015-03-09 DIAGNOSIS — J188 Other pneumonia, unspecified organism: Secondary | ICD-10-CM | POA: Diagnosis not present

## 2015-03-09 DIAGNOSIS — K5900 Constipation, unspecified: Secondary | ICD-10-CM | POA: Diagnosis not present

## 2015-03-09 LAB — CBC
HCT: 37.4 % — ABNORMAL LOW (ref 39.0–52.0)
HEMOGLOBIN: 12.4 g/dL — AB (ref 13.0–17.0)
MCH: 31.5 pg (ref 26.0–34.0)
MCHC: 33.2 g/dL (ref 30.0–36.0)
MCV: 94.9 fL (ref 78.0–100.0)
Platelets: 187 10*3/uL (ref 150–400)
RBC: 3.94 MIL/uL — AB (ref 4.22–5.81)
RDW: 15.1 % (ref 11.5–15.5)
WBC: 14.4 10*3/uL — ABNORMAL HIGH (ref 4.0–10.5)

## 2015-03-09 LAB — BASIC METABOLIC PANEL
ANION GAP: 7 (ref 5–15)
BUN: 19 mg/dL (ref 6–20)
CHLORIDE: 98 mmol/L — AB (ref 101–111)
CO2: 30 mmol/L (ref 22–32)
Calcium: 9 mg/dL (ref 8.9–10.3)
Creatinine, Ser: 1.1 mg/dL (ref 0.61–1.24)
GFR calc non Af Amer: >60 mL/min (ref >60)
Glucose, Bld: 165 mg/dL — ABNORMAL HIGH (ref 65–99)
Potassium: 4.6 mmol/L (ref 3.5–5.1)
Sodium: 135 mmol/L (ref 135–145)

## 2015-03-09 LAB — GLUCOSE, CAPILLARY
GLUCOSE-CAPILLARY: 158 mg/dL — AB (ref 65–99)
GLUCOSE-CAPILLARY: 150 mg/dL — AB (ref 65–99)
Glucose-Capillary: 179 mg/dL — ABNORMAL HIGH (ref 65–99)
Glucose-Capillary: 126 mg/dL — ABNORMAL HIGH (ref 65–99)

## 2015-03-09 LAB — MAGNESIUM: Magnesium: 1.9 mg/dL (ref 1.7–2.4)

## 2015-03-09 LAB — LACTIC ACID, PLASMA: Lactic Acid, Venous: 1.4 mmol/L (ref 0.5–2.0)

## 2015-03-09 MED ORDER — IOHEXOL 300 MG/ML  SOLN
25.0000 mL | INTRAMUSCULAR | Status: AC
  Administered 2015-03-09 (×2): 25 mL via ORAL

## 2015-03-09 MED ORDER — IOHEXOL 300 MG/ML  SOLN
100.0000 mL | Freq: Once | INTRAMUSCULAR | Status: AC | PRN
  Administered 2015-03-09: 100 mL via INTRAVENOUS

## 2015-03-09 MED ORDER — FLEET ENEMA 7-19 GM/118ML RE ENEM
1.0000 | ENEMA | Freq: Once | RECTAL | Status: AC
  Administered 2015-03-09: 1 via RECTAL
  Filled 2015-03-09: qty 1

## 2015-03-09 NOTE — Progress Notes (Signed)
[facility] PROGRESS NOTE    Progress Note   Terry Lyons [id number] DOB: January 04, 1946 DOA: 03/06/2015 PCP: [name], MD   Brief Narrative:   Terry Lyons is an 70 y.o. male   Assessment/Plan:   Intractable nausea and vomiting and epigastric pain/Ileus: Unlikely cardiac in etiology, cardiology was consulted and recommended gastrointestinal workup. Increase her Protonix to twice a day IV. His hemoglobin drop from 15 in 2015 to 12 now. He relates his pain is mildly improved but he still tender on physical exam. He is on meloxicam home guaiac stools 3. He had an EGD on May 2016 and was not on Protonix at home. Continue Zofran for nausea. We'll place him nothing by mouth minimize narcotics monitor electrolytes, avoid narcotics and treat him conservatively for his ileus. We'll consult GI for the possibility of possible endoscopical procedure  Essential hypertension, benign Continue metoprolol and Aldactone.  AKI: Multifactorial, prerenal and contrast-induced.  Hypothyroidism: Continue Synthroid.   Severe obesity (BMI >= 40) (HCC)  Uncontrolled diabetes mellitus type 2 without complications ([place]) NPO. Hold metformin start sliding scale insulin.    DVT Prophylaxis - SCD's  Family Communication: wife Disposition Plan: Unable to determine Code Status:     Code Status Orders        Start     Ordered   03/06/15 1549  Full code   Continuous     03/06/15 1548    Code Status History    Date Active Date Inactive Code Status Order ID Comments User Context   This patient has a current code status but no historical code status.    Advance Directive Documentation        Most Recent Value   Type of Advance Directive  Healthcare Power of Attorney, Living will   Pre-existing out of facility DNR order (yellow form or pink MOST form)     "MOST" Form in Place?          IV Access:    Peripheral IV   Procedures and diagnostic studies:   Dg Chest Port 1  View  03/08/2015  CLINICAL DATA:  Acute hypoxemia. EXAM: PORTABLE CHEST 1 VIEW COMPARISON:  Two-view chest x-ray and CTA chest 03/06/2015. FINDINGS: Markedly suboptimal inspiration which accounts for atelectasis in the lung bases, progressive since the examinations 2 days ago. Cardiac silhouette upper normal in size for technique and degree of inspiration. Normal pulmonary vascularity. No visible pleural effusions. IMPRESSION: Markedly suboptimal inspiration accounts for bibasilar atelectasis, progressive since the examinations 2 days ago Electronically Signed   By: Evangeline Dakin M.D.   On: 03/08/2015 12:28   Dg Abd Portable 1v  03/08/2015  CLINICAL DATA:  Abdominal pain across upper abdomen. EXAM: PORTABLE ABDOMEN - 1 VIEW COMPARISON:  CT scan from 03/06/2015. FINDINGS: Supine view the abdomen shows gaseous distension of colon and small bowel in the mid abdomen. Study is limited by portable technique. IMPRESSION: Mild gaseous distention of small bowel and colon. A component of ileus is not excluded. Electronically Signed   By: Misty Stanley M.D.   On: 03/08/2015 11:52     Medical Consultants:    None.  Anti-Infectives:   Anti-infectives    Start     Dose/Rate Route Frequency Ordered Stop   03/09/15 1600  levofloxacin (LEVAQUIN) IVPB 750 mg     750 mg 100 mL/hr over 90 Minutes Intravenous Every 24 hours 03/08/15 1557 03/13/15 1559   03/08/15 1600  levofloxacin (LEVAQUIN) IVPB 750 mg  750 mg 100 mL/hr over 90 Minutes Intravenous NOW 03/08/15 1557 03/08/15 1929      Subjective:    Syliva Overman still complaining of abdominal pain  Objective:    Filed Vitals:   03/08/15 2134 03/08/15 2356 03/09/15 0215 03/09/15 0545  BP: 189/96  133/77 155/86  Pulse:   122 130  Temp: 99.4 F (37.4 C)  98.2 F (36.8 C) 97.9 F (36.6 C)  TempSrc: Oral  Oral Oral  Resp:   20 20  Height:      Weight:      SpO2: 95% 94% 95% 98%    Intake/Output Summary (Last 24 hours) at 03/09/15  1009 Last data filed at 03/09/15 0700  Gross per 24 hour  Intake 3506.67 ml  Output    850 ml  Net 2656.67 ml   Filed Weights   03/06/15 1554 03/07/15 0649 03/08/15 0500  Weight: 140.9 kg (310 lb 10.1 oz) 141.341 kg (311 lb 9.6 oz) 141.069 kg (311 lb)    Exam: Gen:  NAD Cardiovascular:  RRR. Chest and lungs:   CTAB Abdomen:  Abdomen soft, still with significant epigastric and right upper quadrant pain Extremities:  No C/E/C   Data Reviewed:    Labs: Basic Metabolic Panel:  Recent Labs Lab 03/06/15 0810 03/07/15 0422 03/08/15 0542 03/08/15 1927 03/09/15 0523  NA 135 134* 134* 132* 135  K 4.8 4.6 5.2* 5.1 4.6  CL 102 98* 100* 96* 98*  CO2 22 24 26 26 30   GLUCOSE 214* 150* 155* 156* 165*  BUN 17 21* 31* 21* 19  CREATININE 1.04 1.07 1.38* 1.17 1.10  CALCIUM 9.3 8.7* 9.0 8.9 9.0   GFR Estimated Creatinine Clearance: 84.9 mL/min (by C-G formula based on Cr of 1.1). Liver Function Tests:  Recent Labs Lab 03/06/15 0810 03/07/15 0422  AST 49* 39  ALT 36 37  ALKPHOS 83 62  BILITOT 1.4* 1.2  PROT 6.8 7.1  ALBUMIN 3.6 3.7    Recent Labs Lab 03/06/15 1222  LIPASE 33   No results for input(s): AMMONIA in the last 168 hours. Coagulation profile No results for input(s): INR, PROTIME in the last 168 hours.  CBC:  Recent Labs Lab 03/06/15 0810 03/07/15 0422 03/08/15 0542 03/09/15 0523  WBC 6.7 15.4* 18.0* 14.4*  HGB 14.2 13.9 13.6 12.4*  HCT 42.3 43.0 42.4 37.4*  MCV 93.6 97.1 97.0 94.9  PLT 218 200 185 187   Cardiac Enzymes: No results for input(s): CKTOTAL, CKMB, CKMBINDEX, TROPONINI in the last 168 hours. BNP (last 3 results) No results for input(s): PROBNP in the last 8760 hours. CBG:  Recent Labs Lab 03/08/15 0747 03/08/15 1153 03/08/15 1710 03/08/15 2133 03/09/15 0802  GLUCAP 156* 161* 150* 145* 179*   D-Dimer: No results for input(s): DDIMER in the last 72 hours. Hgb A1c: No results for input(s): HGBA1C in the last 72  hours. Lipid Profile: No results for input(s): CHOL, HDL, LDLCALC, TRIG, CHOLHDL, LDLDIRECT in the last 72 hours. Thyroid function studies: No results for input(s): TSH, T4TOTAL, T3FREE, THYROIDAB in the last 72 hours.  Invalid input(s): FREET3 Anemia work up: No results for input(s): VITAMINB12, FOLATE, FERRITIN, TIBC, IRON, RETICCTPCT in the last 72 hours. Sepsis Labs:  Recent Labs Lab 03/06/15 0810 03/07/15 0422 03/08/15 0542 03/09/15 0523  WBC 6.7 15.4* 18.0* 14.4*   Microbiology No results found for this or any previous visit (from the past 240 hour(s)).   Medications:   . allopurinol  150 mg Oral QHS  .  allopurinol  300 mg Oral Daily  . aspirin EC  81 mg Oral Daily  . bisacodyl  10 mg Rectal Daily  . calcium carbonate  1 tablet Oral Q breakfast  . doxazosin  2 mg Oral QHS  . insulin aspart  0-15 Units Subcutaneous TID WC  . insulin aspart  0-5 Units Subcutaneous QHS  . insulin aspart  3 Units Subcutaneous TID WC  . levofloxacin (LEVAQUIN) IV  750 mg Intravenous Q24H  . levothyroxine  50 mcg Oral QAC breakfast  . loratadine  10 mg Oral Daily  . metoprolol tartrate  12.5 mg Oral BID  . multivitamin with minerals  1 tablet Oral Daily  . omega-3 acid ethyl esters  1 g Oral Daily  . pantoprazole (PROTONIX) IV  40 mg Intravenous Q12H  . simethicone  160 mg Oral TID  . sodium phosphate  1 enema Rectal Once   Continuous Infusions:    Time spent: 25 min   LOS: 3 days   Charlynne Cousins  [facility] Pager [phone]  *Please refer to [email or web address], password [email or web address] to get updated schedule on who will round on this patient, as hospitalists switch teams weekly. If 7PM-7AM, please contact night-coverage at [email or web address], password [email or web address] for any overnight needs.  03/09/2015, 10:09 AM

## 2015-03-09 NOTE — Consult Note (Signed)
   [facility] Inpatient Consult   03/09/2015  Terry Lyons 1945-07-06 [id number]  Patient screened for [facility] services. Went to bedside to speak with patient and wife. They pleasantly declined but asked for literature to look over to call if interested. Left [facility] Care Management brochure and contact information. Will make inpatient RNCM aware that patient declined [facility] [facility] program at this time.   Marthenia Rolling, MSN-Ed, RN,BSN [facility] Liaison [phone]

## 2015-03-09 NOTE — Consult Note (Signed)
Referring Provider: No ref. provider found Primary Care Physician:  [name], MD Primary Gastroenterologist:  Dr. Henrene Pastor  Reason for Consultation:  Drop in Hgb on NSAIDS  HPI: Terry Lyons is a 70 y.o. male with past medical history of diabetes, dyslipidemia, hypertension, hypothyroidism, arthritis for which he uses Meloxicam daily.  He presented to [facility] hospital on 1/21 with reports of diffuse abdominal pains, 10 out of 10 in intensity, worse with breathing and no significant alleviating factors. Patient reports associated nausea and nonbloody vomiting. No vomiting for the past 2 days.  CT scan without contrast was ok.  It was performed without contrast because Cr was elevated from baseline at the time of admission.  Has been treated as an ileus.  Received some suppositories and enemas and has moved his bowel a couple of times today.  Still with some diffuse abdominal pain.  As stated, he does take Meloxicam daily.  Is "supposed to be taking prilosec daily" but only takes it on occasion.  Admits to occasional/intermittent dysphagia, but not like it was prior to last EGD with dilation (listed below).  He's had some drop in Hgb about 3 grams from baseline since his admission.  WBC count elevated some as well and he is on antibiotics for PNA.   EGD 06/2014 by Dr. Henrene Pastor showed normal esophagus; possible subtle ring that was dilated to 69 [place].   Colonoscopy 10/2013 by Dr. Henrene Pastor showed diminutive polyp that was a tubular adenoma.  Past Medical History  Diagnosis Date  . GERD (gastroesophageal reflux disease)   . Gout   . History of basal cell carcinoma     W-S Derm  . History of squamous cell carcinoma   . Hyperlipidemia   . Tachycardia   . Hypertension   . Allergy   . Rosacea   . Diabetes mellitus   . Arthritis   . Obesity     Past Surgical History  Procedure Laterality Date  . Tonsillectomy    . Pilonidal cyst excision    . Knee arthroscopy      R  . Colonoscopy with  polypectomy      X3  . Esophagus dilated      X3  . Shoulder surgery      right shoulder  . Total knee arthroplasty      RIGHT  . Knee arthroscopy      bilaterally  . Total ethmoidectomy & sphenoidectomiy Bilateral 05/27/13    also reduction of turbinates    Prior to Admission medications   Medication Sig Start Date End Date Taking? Authorizing Provider  allopurinol (ZYLOPRIM) 300 MG tablet Take 150-300 mg by mouth 2 (two) times daily. 1 tablet (300 mg) in the morning and 0.5 tablets (150 mg) at night   Yes Historical Provider, MD  aspirin EC 81 MG tablet Take 81 mg by mouth daily.   Yes Historical Provider, MD  Calcium Carbonate (CALCIUM 600) 1500 MG TABS Take 1,500 mg by mouth daily as needed (nutrition).    Yes Historical Provider, MD  cetirizine (ZYRTEC) 10 MG tablet Take 10 mg by mouth daily.   Yes Historical Provider, MD  Cholecalciferol (VITAMIN D PO) Take 1 tablet by mouth daily.   Yes Historical Provider, MD  Coenzyme Q10 (CO Q 10 PO) Take 1 capsule by mouth daily.    Yes Historical Provider, MD  doxazosin (CARDURA) 4 MG tablet Take 1 tablet (4 mg total) by mouth at bedtime. Patient taking differently: Take 2 mg  by mouth at bedtime.  08/24/11  Yes Hendricks Limes, MD  furosemide (LASIX) 40 MG tablet Take 1 tablet (40 mg total) by mouth daily as needed for edema. 01/28/15  Yes Dorothy Spark, MD  levothyroxine (SYNTHROID, LEVOTHROID) 50 MCG tablet Take 50 mcg by mouth daily before breakfast.   Yes Historical Provider, MD  meloxicam (MOBIC) 7.5 MG tablet Take 1 tablet (7.5 mg total) by mouth daily. 05/27/12  Yes Hendricks Limes, MD  metFORMIN (GLUCOPHAGE) 500 MG tablet Take 1 tablet (500 mg total) by mouth daily with breakfast. 1 by mouth daily with breakfast, LABS DUE Patient taking differently: Take 500 mg by mouth daily with breakfast. LABS DUE 05/27/12  Yes Hendricks Limes, MD  metoprolol tartrate (LOPRESSOR) 25 MG tablet Take 12.5 mg by mouth 2 (two) times daily.   Yes  Historical Provider, MD  Multiple Vitamin (MULTIVITAMIN WITH MINERALS) TABS tablet Take 1 tablet by mouth daily.   Yes Historical Provider, MD  Multiple Vitamins-Minerals (MULTIVITAMIN PO) Take 1 tablet by mouth daily. Joint Pain.   Yes Historical Provider, MD  Omega-3 Fatty Acids (FISH OIL) 1000 MG CAPS Take 1 capsule by mouth daily.    Yes Historical Provider, MD  omeprazole (PRILOSEC) 20 MG capsule Take 20 mg by mouth daily.    Yes Historical Provider, MD  Red Yeast Rice Extract (RED YEAST RICE PO) Take 1 capsule by mouth at bedtime.    Yes Historical Provider, MD  Saw Palmetto, Serenoa repens, (SAW PALMETTO PO) Take 1 capsule by mouth daily.   Yes Historical Provider, MD  spironolactone (ALDACTONE) 25 MG tablet Take 1 tablet (25 mg total) by mouth daily. 05/22/13  Yes Hendricks Limes, MD    Current Facility-Administered Medications  Medication Dose Route Frequency Provider Last Rate Last Dose  . acetaminophen (TYLENOL) tablet 650 mg  650 mg Oral Q6H PRN Dianne Dun, NP   650 mg at 03/08/15 0445  . allopurinol (ZYLOPRIM) tablet 150 mg  150 mg Oral QHS Robbie Lis, MD   150 mg at 03/08/15 2227  . allopurinol (ZYLOPRIM) tablet 300 mg  300 mg Oral Daily Robbie Lis, MD   300 mg at 03/09/15 1012  . alum & mag hydroxide-simeth (MAALOX/MYLANTA) 200-200-20 MG/5ML suspension 15 mL  15 mL Oral PRN Charlynne Cousins, MD      . aspirin EC tablet 81 mg  81 mg Oral Daily Robbie Lis, MD   81 mg at 03/09/15 1012  . bisacodyl (DULCOLAX) suppository 10 mg  10 mg Rectal Daily Debbe Odea, MD   10 mg at 03/09/15 1204  . calcium carbonate (OS-CAL - dosed in mg of elemental calcium) tablet 500 mg of elemental calcium  1 tablet Oral Q breakfast Debbe Odea, MD   500 mg of elemental calcium at 03/09/15 0829  . doxazosin (CARDURA) tablet 2 mg  2 mg Oral QHS Robbie Lis, MD   2 mg at 03/08/15 2227  . HYDROmorphone (DILAUDID) injection 1 mg  1 mg Intravenous Q2H PRN Dianne Dun, NP    1 mg at 03/08/15 0325  . insulin aspart (novoLOG) injection 0-15 Units  0-15 Units Subcutaneous TID WC Charlynne Cousins, MD   3 Units at 03/09/15 1203  . insulin aspart (novoLOG) injection 0-5 Units  0-5 Units Subcutaneous QHS Charlynne Cousins, MD   0 Units at 03/07/15 2338  . insulin aspart (novoLOG) injection 3 Units  3 Units Subcutaneous TID WC Bess Harvest  Olevia Bowens, MD   3 Units at 03/08/15 1256  . levalbuterol (XOPENEX) nebulizer solution 0.63 mg  0.63 mg Nebulization Q6H PRN Gardiner Barefoot, NP   0.63 mg at 03/08/15 2356  . levofloxacin (LEVAQUIN) IVPB 750 mg  750 mg Intravenous Q24H Saima Rizwan, MD      . levothyroxine (SYNTHROID, LEVOTHROID) tablet 50 mcg  50 mcg Oral QAC breakfast Robbie Lis, MD   50 mcg at 03/09/15 [id number]  . loratadine (CLARITIN) tablet 10 mg  10 mg Oral Daily Robbie Lis, MD   10 mg at 03/09/15 1013  . metoprolol (LOPRESSOR) injection 5 mg  5 mg Intravenous Q6H PRN Charlynne Cousins, MD   5 mg at 03/08/15 2229  . metoprolol tartrate (LOPRESSOR) tablet 12.5 mg  12.5 mg Oral BID Charlynne Cousins, MD   12.5 mg at 03/09/15 1012  . morphine 2 MG/ML injection 2 mg  2 mg Intravenous Q4H PRN Debbe Odea, MD   2 mg at 03/09/15 1204  . multivitamin with minerals tablet 1 tablet  1 tablet Oral Daily Robbie Lis, MD   1 tablet at 03/09/15 1012  . omega-3 acid ethyl esters (LOVAZA) capsule 1 g  1 g Oral Daily Robbie Lis, MD   1 g at 03/09/15 1012  . ondansetron (ZOFRAN) tablet 4 mg  4 mg Oral Q6H PRN Robbie Lis, MD       Or  . ondansetron [facility]) injection 4 mg  4 mg Intravenous Q6H PRN Robbie Lis, MD   4 mg at 03/06/15 1515  . oxyCODONE (Oxy IR/ROXICODONE) immediate release tablet 5-10 mg  5-10 mg Oral Q6H PRN Dianne Dun, NP   10 mg at 03/07/15 2216  . pantoprazole (PROTONIX) injection 40 mg  40 mg Intravenous Q12H Charlynne Cousins, MD   40 mg at 03/09/15 1011  . promethazine (PHENERGAN) injection 12.5 mg  12.5 mg Intravenous Q6H PRN  Robbie Lis, MD   12.5 mg at 03/07/15 1312  . simethicone (MYLICON) chewable tablet 160 mg  160 mg Oral TID Debbe Odea, MD   160 mg at 03/09/15 1012    Allergies as of 03/06/2015 - Review Complete 03/06/2015  Allergen Reaction Noted  . Bupropion hcl  07/12/2007  . Codeine  07/12/2007  . Escitalopram oxalate  07/12/2007  . Sertraline hcl  07/12/2007  . Sulfa antibiotics  01/25/2014  . Sulfur Other (See Comments) 10/18/2010    Family History  Problem Relation Age of Onset  . Lung cancer Mother     smoker  . Heart attack Brother 48    CBAG  . Lung cancer Maternal Aunt     smoker  . Heart attack Paternal Grandmother 48  . Heart disease Father     Rheumatic Heart Disease  . Heart attack Paternal Grandfather 36  . Diabetes Neg Hx   . Stroke Neg Hx   . Colon cancer Neg Hx   . Pancreatic cancer Neg Hx   . Rectal cancer Neg Hx   . Stomach cancer Neg Hx     Social History   Social History  . Marital Status: Married    Spouse Name: N/A  . Number of Children: N/A  . Years of Education: N/A   Occupational History  . Not on file.   Social History Main Topics  . Smoking status: Former Smoker    Types: Cigarettes    Quit date: 10/17/1973  . Smokeless tobacco: Never Used  .  Alcohol Use: No  . Drug Use: No  . Sexual Activity: Not on file   Other Topics Concern  . Not on file   Social History Narrative    Review of Systems: Ten point ROS is O/W negative except as mentioned in HPI.  Physical Exam: Vital signs in last 24 hours: Temp:  [97.9 F (36.6 C)-99.7 F (37.6 C)] 97.9 F (36.6 C) (01/24 0545) Pulse Rate:  [118-130] 130 (01/24 0545) Resp:  [20-22] 20 (01/24 0545) BP: (133-189)/(63-96) 155/86 mmHg (01/24 0545) SpO2:  [94 %-98 %] 98 % (01/24 0545) FiO2 (%):  [2 %] 2 % (01/23 1434) Last BM Date: 03/05/15 General:  Alert, Well-developed, well-nourished, pleasant and cooperative in NAD Head:  Normocephalic and atraumatic. Eyes:  Sclera clear, no icterus.   Conjunctiva pink. Ears:  Normal auditory acuity. Mouth:  No deformity or lesions.   Lungs:  Clear throughout to auscultation.  No wheezes, crackles, or rhonchi.  Heart:  Tachy.  No M/R/G. Abdomen:  Soft, obese, but also somewhat distended and tympanic.  BS present.  Diffuse TTP.  Rectal:  Deferred  Msk:  Symmetrical without gross deformities. Pulses:  Normal pulses noted. Extremities:  2-3+ pitting edema in B/L LE's Neurologic:  Alert and oriented x 4;  grossly normal neurologically. Skin:  Intact without significant lesions or rashes. Psych:  Alert and cooperative. Normal mood and affect.  Intake/Output from previous day: 01/23 0701 - 01/24 0700 In: 3506.7 [P.O.:360; I.V.:3146.7] Out: 850 [Urine:850]  Lab Results:  Recent Labs  03/07/15 0422 03/08/15 0542 03/09/15 0523  WBC 15.4* 18.0* 14.4*  HGB 13.9 13.6 12.4*  HCT 43.0 42.4 37.4*  PLT 200 185 187   BMET  Recent Labs  03/08/15 0542 03/08/15 1927 03/09/15 0523  NA 134* 132* 135  K 5.2* 5.1 4.6  CL 100* 96* 98*  CO2 26 26 30   GLUCOSE 155* 156* 165*  BUN 31* 21* 19  CREATININE 1.38* 1.17 1.10  CALCIUM 9.0 8.9 9.0   LFT  Recent Labs  03/07/15 0422  PROT 7.1  ALBUMIN 3.7  AST 39  ALT 37  ALKPHOS 62  BILITOT 1.2   Studies/Results: Dg Chest Port 1 View  03/08/2015  CLINICAL DATA:  Acute hypoxemia. EXAM: PORTABLE CHEST 1 VIEW COMPARISON:  Two-view chest x-ray and CTA chest 03/06/2015. FINDINGS: Markedly suboptimal inspiration which accounts for atelectasis in the lung bases, progressive since the examinations 2 days ago. Cardiac silhouette upper normal in size for technique and degree of inspiration. Normal pulmonary vascularity. No visible pleural effusions. IMPRESSION: Markedly suboptimal inspiration accounts for bibasilar atelectasis, progressive since the examinations 2 days ago Electronically Signed   By: Evangeline Dakin M.D.   On: 03/08/2015 12:28   Dg Abd Portable 1v  03/08/2015  CLINICAL DATA:   Abdominal pain across upper abdomen. EXAM: PORTABLE ABDOMEN - 1 VIEW COMPARISON:  CT scan from 03/06/2015. FINDINGS: Supine view the abdomen shows gaseous distension of colon and small bowel in the mid abdomen. Study is limited by portable technique. IMPRESSION: Mild gaseous distention of small bowel and colon. A component of ileus is not excluded. Electronically Signed   By: Misty Stanley M.D.   On: 03/08/2015 11:52    IMPRESSION:  -70 year old male admitted with ileus type picture with complaints of diffuse abdominal pain, nausea, and vomiting.  Also noted to have a Hgb that is trending down that has decreased by 2-3 grams from baseline (likely somewhat dilutional).  Chronic NSAID use with Meloxicam.   -  Dysphagia:  Recurrent, intermittent. -PNA:  On antibiotics.  Improving.  PLAN: -Will repeat CT scan of the abdomen and pelvis with contrast now that renal function has recovered.  Hopefully this can be done today. -Pending those results we will consider EGD as well.  Will make NPO after midnight tentatively. -Continue pantoprazole 40 mg IV BID for now. -Antiemetic prn.  ZEHR, JESSICA D.  03/09/2015, 1:03 PM  Pager number [phone]      Attending physician's note   I have taken a history, examined the patient and reviewed the chart. I agree with the Advanced Practitioner's note, impression and recommendations. Abd pain, distention, constipation, N/V. Hb decrease without change in stool or vomiting. Mild ileus noted on 1/23 films. Takes Meloxicam daily. Had BM today post enema. Suspect this is a mild ileus with constipation. R/O GI bleeding vs hydration leading to Hb decrease with an underlying anemia. Repeat CT with IV contrast. PPI bid. Consider EGD.   Lucio Edward, MD [facility] [phone] Mon-Fri 8a-5p [phone] after 5p, weekends, holidays

## 2015-03-09 NOTE — Care Management Important Message (Signed)
Important Message  Patient Details  Name: Terry Lyons MRN: [id number] Date of Birth: 10-Oct-1945   Medicare Important Message Given:  Yes    Camillo Flaming 03/09/2015, 11:01 AMImportant Message  Patient Details  Name: Terry Lyons MRN: [id number] Date of Birth: 05-25-1945   Medicare Important Message Given:  Yes    Camillo Flaming 03/09/2015, 11:00 AM

## 2015-03-10 ENCOUNTER — Inpatient Hospital Stay (HOSPITAL_COMMUNITY): Payer: Non-veteran care

## 2015-03-10 ENCOUNTER — Encounter (HOSPITAL_COMMUNITY): Payer: Self-pay

## 2015-03-10 ENCOUNTER — Encounter (HOSPITAL_COMMUNITY)
Admission: EM | Disposition: A | Discharge status: Home Health | Payer: Self-pay | Source: Home / Self Care | Attending: Internal Medicine

## 2015-03-10 DIAGNOSIS — R1013 Epigastric pain: Secondary | ICD-10-CM | POA: Diagnosis not present

## 2015-03-10 DIAGNOSIS — E039 Hypothyroidism, unspecified: Secondary | ICD-10-CM | POA: Diagnosis present

## 2015-03-10 DIAGNOSIS — R1084 Generalized abdominal pain: Secondary | ICD-10-CM | POA: Diagnosis not present

## 2015-03-10 DIAGNOSIS — K5900 Constipation, unspecified: Secondary | ICD-10-CM | POA: Diagnosis not present

## 2015-03-10 DIAGNOSIS — D649 Anemia, unspecified: Secondary | ICD-10-CM | POA: Diagnosis not present

## 2015-03-10 DIAGNOSIS — K2980 Duodenitis without bleeding: Secondary | ICD-10-CM | POA: Diagnosis not present

## 2015-03-10 DIAGNOSIS — K567 Ileus, unspecified: Secondary | ICD-10-CM | POA: Diagnosis not present

## 2015-03-10 DIAGNOSIS — R1011 Right upper quadrant pain: Secondary | ICD-10-CM | POA: Diagnosis not present

## 2015-03-10 DIAGNOSIS — G43A1 Cyclical vomiting, intractable: Secondary | ICD-10-CM | POA: Diagnosis not present

## 2015-03-10 DIAGNOSIS — R933 Abnormal findings on diagnostic imaging of other parts of digestive tract: Secondary | ICD-10-CM | POA: Diagnosis not present

## 2015-03-10 DIAGNOSIS — R000 Tachycardia, unspecified: Secondary | ICD-10-CM | POA: Diagnosis present

## 2015-03-10 DIAGNOSIS — I10 Essential (primary) hypertension: Secondary | ICD-10-CM | POA: Diagnosis not present

## 2015-03-10 DIAGNOSIS — R112 Nausea with vomiting, unspecified: Secondary | ICD-10-CM | POA: Diagnosis not present

## 2015-03-10 DIAGNOSIS — R071 Chest pain on breathing: Secondary | ICD-10-CM | POA: Diagnosis not present

## 2015-03-10 HISTORY — PX: ESOPHAGOGASTRODUODENOSCOPY: SHX5428

## 2015-03-10 LAB — BASIC METABOLIC PANEL
Anion gap: 10 (ref 5–15)
BUN: 19 mg/dL (ref 6–20)
CALCIUM: 9.1 mg/dL (ref 8.9–10.3)
CO2: 27 mmol/L (ref 22–32)
CREATININE: 1.03 mg/dL (ref 0.61–1.24)
Chloride: 96 mmol/L — ABNORMAL LOW (ref 101–111)
GFR calc Af Amer: >60 mL/min (ref >60)
GFR calc non Af Amer: >60 mL/min (ref >60)
GLUCOSE: 141 mg/dL — AB (ref 65–99)
Potassium: 4.1 mmol/L (ref 3.5–5.1)
Sodium: 133 mmol/L — ABNORMAL LOW (ref 135–145)

## 2015-03-10 LAB — CBC
HEMATOCRIT: 36.2 % — AB (ref 39.0–52.0)
Hemoglobin: 12.5 g/dL — ABNORMAL LOW (ref 13.0–17.0)
MCH: 32.3 pg (ref 26.0–34.0)
MCHC: 34.5 g/dL (ref 30.0–36.0)
MCV: 93.5 fL (ref 78.0–100.0)
Platelets: 191 10*3/uL (ref 150–400)
RBC: 3.87 MIL/uL — ABNORMAL LOW (ref 4.22–5.81)
RDW: 15.1 % (ref 11.5–15.5)
WBC: 16.1 10*3/uL — ABNORMAL HIGH (ref 4.0–10.5)

## 2015-03-10 LAB — HEPATIC FUNCTION PANEL
ALBUMIN: 2.7 g/dL — AB (ref 3.5–5.0)
ALK PHOS: 81 U/L (ref 38–126)
ALT: 28 U/L (ref 17–63)
AST: 33 U/L (ref 15–41)
BILIRUBIN TOTAL: 1.3 mg/dL — AB (ref 0.3–1.2)
Bilirubin, Direct: 0.4 mg/dL (ref 0.1–0.5)
Indirect Bilirubin: 0.9 mg/dL (ref 0.3–0.9)
Total Protein: 6.5 g/dL (ref 6.5–8.1)

## 2015-03-10 LAB — GLUCOSE, CAPILLARY
GLUCOSE-CAPILLARY: 113 mg/dL — AB (ref 65–99)
Glucose-Capillary: 139 mg/dL — ABNORMAL HIGH (ref 65–99)
Glucose-Capillary: 128 mg/dL — ABNORMAL HIGH (ref 65–99)
Glucose-Capillary: 140 mg/dL — ABNORMAL HIGH (ref 65–99)

## 2015-03-10 LAB — BRAIN NATRIURETIC PEPTIDE: B Natriuretic Peptide: 150.4 pg/mL — ABNORMAL HIGH (ref 0.0–100.0)

## 2015-03-10 LAB — TSH: TSH: 3.747 u[IU]/mL (ref 0.350–4.500)

## 2015-03-10 SURGERY — EGD (ESOPHAGOGASTRODUODENOSCOPY)
Anesthesia: Moderate Sedation

## 2015-03-10 MED ORDER — FLUCONAZOLE 100 MG PO TABS
100.0000 mg | ORAL_TABLET | Freq: Every day | ORAL | Status: DC
  Administered 2015-03-10 – 2015-03-13 (×4): 100 mg via ORAL
  Filled 2015-03-10 (×4): qty 1

## 2015-03-10 MED ORDER — LEVALBUTEROL HCL 0.63 MG/3ML IN NEBU
0.6300 mg | INHALATION_SOLUTION | Freq: Three times a day (TID) | RESPIRATORY_TRACT | Status: DC
  Administered 2015-03-11: 0.63 mg via RESPIRATORY_TRACT
  Filled 2015-03-10: qty 3

## 2015-03-10 MED ORDER — POLYETHYLENE GLYCOL 3350 17 G PO PACK
17.0000 g | PACK | Freq: Two times a day (BID) | ORAL | Status: AC
  Administered 2015-03-10 – 2015-03-11 (×3): 17 g via ORAL
  Filled 2015-03-10 (×6): qty 1

## 2015-03-10 MED ORDER — LEVALBUTEROL HCL 0.63 MG/3ML IN NEBU
0.6300 mg | INHALATION_SOLUTION | Freq: Four times a day (QID) | RESPIRATORY_TRACT | Status: DC
  Administered 2015-03-10: 0.63 mg via RESPIRATORY_TRACT
  Filled 2015-03-10: qty 3

## 2015-03-10 MED ORDER — SODIUM CHLORIDE 0.9 % IV SOLN
INTRAVENOUS | Status: DC
  Administered 2015-03-10: 10:00:00 via INTRAVENOUS

## 2015-03-10 MED ORDER — FENTANYL CITRATE (PF) 100 MCG/2ML IJ SOLN
INTRAMUSCULAR | Status: AC
  Filled 2015-03-10: qty 2

## 2015-03-10 MED ORDER — MIDAZOLAM HCL 5 MG/ML IJ SOLN
INTRAMUSCULAR | Status: AC
  Filled 2015-03-10: qty 2

## 2015-03-10 MED ORDER — MIDAZOLAM HCL 10 MG/2ML IJ SOLN
INTRAMUSCULAR | Status: DC | PRN
  Administered 2015-03-10 (×2): 2 mg via INTRAVENOUS
  Administered 2015-03-10: 1 mg via INTRAVENOUS

## 2015-03-10 MED ORDER — FUROSEMIDE 10 MG/ML IJ SOLN
40.0000 mg | Freq: Once | INTRAMUSCULAR | Status: AC
  Administered 2015-03-10: 40 mg via INTRAVENOUS
  Filled 2015-03-10: qty 4

## 2015-03-10 MED ORDER — FENTANYL CITRATE (PF) 100 MCG/2ML IJ SOLN
INTRAMUSCULAR | Status: DC | PRN
  Administered 2015-03-10 (×2): 25 ug via INTRAVENOUS

## 2015-03-10 NOTE — H&P (View-Only) (Signed)
Referring Provider: No ref. provider found Primary Care Physician:  [name], MD Primary Gastroenterologist:  Dr. Henrene Pastor  Reason for Consultation:  Drop in Hgb on NSAIDS  HPI: Terry Lyons is a 70 y.o. male with past medical history of diabetes, dyslipidemia, hypertension, hypothyroidism, arthritis for which he uses Meloxicam daily.  He presented to [facility] on 1/21 with reports of diffuse abdominal pains, 10 out of 10 in intensity, worse with breathing and no significant alleviating factors. Patient reports associated nausea and nonbloody vomiting. No vomiting for the past 2 days.  CT scan without contrast was ok.  It was performed without contrast because Cr was elevated from baseline at the time of admission.  Has been treated as an ileus.  Received some suppositories and enemas and has moved his bowel a couple of times today.  Still with some diffuse abdominal pain.  As stated, he does take Meloxicam daily.  Is "supposed to be taking prilosec daily" but only takes it on occasion.  Admits to occasional/intermittent dysphagia, but not like it was prior to last EGD with dilation (listed below).  He's had some drop in Hgb about 3 grams from baseline since his admission.  WBC count elevated some as well and he is on antibiotics for PNA.   EGD 06/2014 by Dr. Henrene Pastor showed normal esophagus; possible subtle ring that was dilated to 108 [place].   Colonoscopy 10/2013 by Dr. Henrene Pastor showed diminutive polyp that was a tubular adenoma.  Past Medical History  Diagnosis Date  . GERD (gastroesophageal reflux disease)   . Gout   . History of basal cell carcinoma     W-S Derm  . History of squamous cell carcinoma   . Hyperlipidemia   . Tachycardia   . Hypertension   . Allergy   . Rosacea   . Diabetes mellitus   . Arthritis   . Obesity     Past Surgical History  Procedure Laterality Date  . Tonsillectomy    . Pilonidal cyst excision    . Knee arthroscopy      R  . Colonoscopy with  polypectomy      X3  . Esophagus dilated      X3  . Shoulder surgery      right shoulder  . Total knee arthroplasty      RIGHT  . Knee arthroscopy      bilaterally  . Total ethmoidectomy & sphenoidectomiy Bilateral 05/27/13    also reduction of turbinates    Prior to Admission medications   Medication Sig Start Date End Date Taking? Authorizing Provider  allopurinol (ZYLOPRIM) 300 MG tablet Take 150-300 mg by mouth 2 (two) times daily. 1 tablet (300 mg) in the morning and 0.5 tablets (150 mg) at night   Yes Historical Provider, MD  aspirin EC 81 MG tablet Take 81 mg by mouth daily.   Yes Historical Provider, MD  Calcium Carbonate (CALCIUM 600) 1500 MG TABS Take 1,500 mg by mouth daily as needed (nutrition).    Yes Historical Provider, MD  cetirizine (ZYRTEC) 10 MG tablet Take 10 mg by mouth daily.   Yes Historical Provider, MD  Cholecalciferol (VITAMIN D PO) Take 1 tablet by mouth daily.   Yes Historical Provider, MD  Coenzyme Q10 (CO Q 10 PO) Take 1 capsule by mouth daily.    Yes Historical Provider, MD  doxazosin (CARDURA) 4 MG tablet Take 1 tablet (4 mg total) by mouth at bedtime. Patient taking differently: Take 2 mg  by mouth at bedtime.  08/24/11  Yes Hendricks Limes, MD  furosemide (LASIX) 40 MG tablet Take 1 tablet (40 mg total) by mouth daily as needed for edema. 01/28/15  Yes Dorothy Spark, MD  levothyroxine (SYNTHROID, LEVOTHROID) 50 MCG tablet Take 50 mcg by mouth daily before breakfast.   Yes Historical Provider, MD  meloxicam (MOBIC) 7.5 MG tablet Take 1 tablet (7.5 mg total) by mouth daily. 05/27/12  Yes Hendricks Limes, MD  metFORMIN (GLUCOPHAGE) 500 MG tablet Take 1 tablet (500 mg total) by mouth daily with breakfast. 1 by mouth daily with breakfast, LABS DUE Patient taking differently: Take 500 mg by mouth daily with breakfast. LABS DUE 05/27/12  Yes Hendricks Limes, MD  metoprolol tartrate (LOPRESSOR) 25 MG tablet Take 12.5 mg by mouth 2 (two) times daily.   Yes  Historical Provider, MD  Multiple Vitamin (MULTIVITAMIN WITH MINERALS) TABS tablet Take 1 tablet by mouth daily.   Yes Historical Provider, MD  Multiple Vitamins-Minerals (MULTIVITAMIN PO) Take 1 tablet by mouth daily. Joint Pain.   Yes Historical Provider, MD  Omega-3 Fatty Acids (FISH OIL) 1000 MG CAPS Take 1 capsule by mouth daily.    Yes Historical Provider, MD  omeprazole (PRILOSEC) 20 MG capsule Take 20 mg by mouth daily.    Yes Historical Provider, MD  Red Yeast Rice Extract (RED YEAST RICE PO) Take 1 capsule by mouth at bedtime.    Yes Historical Provider, MD  Saw Palmetto, Serenoa repens, (SAW PALMETTO PO) Take 1 capsule by mouth daily.   Yes Historical Provider, MD  spironolactone (ALDACTONE) 25 MG tablet Take 1 tablet (25 mg total) by mouth daily. 05/22/13  Yes Hendricks Limes, MD    Current Facility-Administered Medications  Medication Dose Route Frequency Provider Last Rate Last Dose  . acetaminophen (TYLENOL) tablet 650 mg  650 mg Oral Q6H PRN Dianne Dun, NP   650 mg at 03/08/15 0445  . allopurinol (ZYLOPRIM) tablet 150 mg  150 mg Oral QHS Robbie Lis, MD   150 mg at 03/08/15 2227  . allopurinol (ZYLOPRIM) tablet 300 mg  300 mg Oral Daily Robbie Lis, MD   300 mg at 03/09/15 1012  . alum & mag hydroxide-simeth (MAALOX/MYLANTA) 200-200-20 MG/5ML suspension 15 mL  15 mL Oral PRN Charlynne Cousins, MD      . aspirin EC tablet 81 mg  81 mg Oral Daily Robbie Lis, MD   81 mg at 03/09/15 1012  . bisacodyl (DULCOLAX) suppository 10 mg  10 mg Rectal Daily Debbe Odea, MD   10 mg at 03/09/15 1204  . calcium carbonate (OS-CAL - dosed in mg of elemental calcium) tablet 500 mg of elemental calcium  1 tablet Oral Q breakfast Debbe Odea, MD   500 mg of elemental calcium at 03/09/15 0829  . doxazosin (CARDURA) tablet 2 mg  2 mg Oral QHS Robbie Lis, MD   2 mg at 03/08/15 2227  . HYDROmorphone (DILAUDID) injection 1 mg  1 mg Intravenous Q2H PRN Dianne Dun, NP    1 mg at 03/08/15 0325  . insulin aspart (novoLOG) injection 0-15 Units  0-15 Units Subcutaneous TID WC Charlynne Cousins, MD   3 Units at 03/09/15 1203  . insulin aspart (novoLOG) injection 0-5 Units  0-5 Units Subcutaneous QHS Charlynne Cousins, MD   0 Units at 03/07/15 2338  . insulin aspart (novoLOG) injection 3 Units  3 Units Subcutaneous TID WC Bess Harvest  Olevia Bowens, MD   3 Units at 03/08/15 1256  . levalbuterol (XOPENEX) nebulizer solution 0.63 mg  0.63 mg Nebulization Q6H PRN Gardiner Barefoot, NP   0.63 mg at 03/08/15 2356  . levofloxacin (LEVAQUIN) IVPB 750 mg  750 mg Intravenous Q24H Saima Rizwan, MD      . levothyroxine (SYNTHROID, LEVOTHROID) tablet 50 mcg  50 mcg Oral QAC breakfast Robbie Lis, MD   50 mcg at 03/09/15 [phone]  . loratadine (CLARITIN) tablet 10 mg  10 mg Oral Daily Robbie Lis, MD   10 mg at 03/09/15 1013  . metoprolol (LOPRESSOR) injection 5 mg  5 mg Intravenous Q6H PRN Charlynne Cousins, MD   5 mg at 03/08/15 2229  . metoprolol tartrate (LOPRESSOR) tablet 12.5 mg  12.5 mg Oral BID Charlynne Cousins, MD   12.5 mg at 03/09/15 1012  . morphine 2 MG/ML injection 2 mg  2 mg Intravenous Q4H PRN Debbe Odea, MD   2 mg at 03/09/15 1204  . multivitamin with minerals tablet 1 tablet  1 tablet Oral Daily Robbie Lis, MD   1 tablet at 03/09/15 1012  . omega-3 acid ethyl esters (LOVAZA) capsule 1 g  1 g Oral Daily Robbie Lis, MD   1 g at 03/09/15 1012  . ondansetron (ZOFRAN) tablet 4 mg  4 mg Oral Q6H PRN Robbie Lis, MD       Or  . ondansetron [facility]) injection 4 mg  4 mg Intravenous Q6H PRN Robbie Lis, MD   4 mg at 03/06/15 1515  . oxyCODONE (Oxy IR/ROXICODONE) immediate release tablet 5-10 mg  5-10 mg Oral Q6H PRN Dianne Dun, NP   10 mg at 03/07/15 2216  . pantoprazole (PROTONIX) injection 40 mg  40 mg Intravenous Q12H Charlynne Cousins, MD   40 mg at 03/09/15 1011  . promethazine (PHENERGAN) injection 12.5 mg  12.5 mg Intravenous Q6H PRN  Robbie Lis, MD   12.5 mg at 03/07/15 1312  . simethicone (MYLICON) chewable tablet 160 mg  160 mg Oral TID Debbe Odea, MD   160 mg at 03/09/15 1012    Allergies as of 03/06/2015 - Review Complete 03/06/2015  Allergen Reaction Noted  . Bupropion hcl  07/12/2007  . Codeine  07/12/2007  . Escitalopram oxalate  07/12/2007  . Sertraline hcl  07/12/2007  . Sulfa antibiotics  01/25/2014  . Sulfur Other (See Comments) 10/18/2010    Family History  Problem Relation Age of Onset  . Lung cancer Mother     smoker  . Heart attack Brother 28    CBAG  . Lung cancer Maternal Aunt     smoker  . Heart attack Paternal Grandmother 63  . Heart disease Father     Rheumatic Heart Disease  . Heart attack Paternal Grandfather 70  . Diabetes Neg Hx   . Stroke Neg Hx   . Colon cancer Neg Hx   . Pancreatic cancer Neg Hx   . Rectal cancer Neg Hx   . Stomach cancer Neg Hx     Social History   Social History  . Marital Status: Married    Spouse Name: N/A  . Number of Children: N/A  . Years of Education: N/A   Occupational History  . Not on file.   Social History Main Topics  . Smoking status: Former Smoker    Types: Cigarettes    Quit date: 10/17/1973  . Smokeless tobacco: Never Used  .  Alcohol Use: No  . Drug Use: No  . Sexual Activity: Not on file   Other Topics Concern  . Not on file   Social History Narrative    Review of Systems: Ten point ROS is O/W negative except as mentioned in HPI.  Physical Exam: Vital signs in last 24 hours: Temp:  [97.9 F (36.6 C)-99.7 F (37.6 C)] 97.9 F (36.6 C) (01/24 0545) Pulse Rate:  [118-130] 130 (01/24 0545) Resp:  [20-22] 20 (01/24 0545) BP: (133-189)/(63-96) 155/86 mmHg (01/24 0545) SpO2:  [94 %-98 %] 98 % (01/24 0545) FiO2 (%):  [2 %] 2 % (01/23 1434) Last BM Date: 03/05/15 General:  Alert, Well-developed, well-nourished, pleasant and cooperative in NAD Head:  Normocephalic and atraumatic. Eyes:  Sclera clear, no icterus.   Conjunctiva pink. Ears:  Normal auditory acuity. Mouth:  No deformity or lesions.   Lungs:  Clear throughout to auscultation.  No wheezes, crackles, or rhonchi.  Heart:  Tachy.  No M/R/G. Abdomen:  Soft, obese, but also somewhat distended and tympanic.  BS present.  Diffuse TTP.  Rectal:  Deferred  Msk:  Symmetrical without gross deformities. Pulses:  Normal pulses noted. Extremities:  2-3+ pitting edema in B/L LE's Neurologic:  Alert and oriented x 4;  grossly normal neurologically. Skin:  Intact without significant lesions or rashes. Psych:  Alert and cooperative. Normal mood and affect.  Intake/Output from previous day: 01/23 0701 - 01/24 0700 In: 3506.7 [P.O.:360; I.V.:3146.7] Out: 850 [Urine:850]  Lab Results:  Recent Labs  03/07/15 0422 03/08/15 0542 03/09/15 0523  WBC 15.4* 18.0* 14.4*  HGB 13.9 13.6 12.4*  HCT 43.0 42.4 37.4*  PLT 200 185 187   BMET  Recent Labs  03/08/15 0542 03/08/15 1927 03/09/15 0523  NA 134* 132* 135  K 5.2* 5.1 4.6  CL 100* 96* 98*  CO2 26 26 30   GLUCOSE 155* 156* 165*  BUN 31* 21* 19  CREATININE 1.38* 1.17 1.10  CALCIUM 9.0 8.9 9.0   LFT  Recent Labs  03/07/15 0422  PROT 7.1  ALBUMIN 3.7  AST 39  ALT 37  ALKPHOS 62  BILITOT 1.2   Studies/Results: Dg Chest Port 1 View  03/08/2015  CLINICAL DATA:  Acute hypoxemia. EXAM: PORTABLE CHEST 1 VIEW COMPARISON:  Two-view chest x-ray and CTA chest 03/06/2015. FINDINGS: Markedly suboptimal inspiration which accounts for atelectasis in the lung bases, progressive since the examinations 2 days ago. Cardiac silhouette upper normal in size for technique and degree of inspiration. Normal pulmonary vascularity. No visible pleural effusions. IMPRESSION: Markedly suboptimal inspiration accounts for bibasilar atelectasis, progressive since the examinations 2 days ago Electronically Signed   By: Evangeline Dakin M.D.   On: 03/08/2015 12:28   Dg Abd Portable 1v  03/08/2015  CLINICAL DATA:   Abdominal pain across upper abdomen. EXAM: PORTABLE ABDOMEN - 1 VIEW COMPARISON:  CT scan from 03/06/2015. FINDINGS: Supine view the abdomen shows gaseous distension of colon and small bowel in the mid abdomen. Study is limited by portable technique. IMPRESSION: Mild gaseous distention of small bowel and colon. A component of ileus is not excluded. Electronically Signed   By: Misty Stanley M.D.   On: 03/08/2015 11:52    IMPRESSION:  -70 year old male admitted with ileus type picture with complaints of diffuse abdominal pain, nausea, and vomiting.  Also noted to have a Hgb that is trending down that has decreased by 2-3 grams from baseline (likely somewhat dilutional).  Chronic NSAID use with Meloxicam.   -  Dysphagia:  Recurrent, intermittent. -PNA:  On antibiotics.  Improving.  PLAN: -Will repeat CT scan of the abdomen and pelvis with contrast now that renal function has recovered.  Hopefully this can be done today. -Pending those results we will consider EGD as well.  Will make NPO after midnight tentatively. -Continue pantoprazole 40 mg IV BID for now. -Antiemetic prn.  ZEHR, JESSICA D.  03/09/2015, 1:03 PM  Pager number [phone]      Attending physician's note   I have taken a history, examined the patient and reviewed the chart. I agree with the Advanced Practitioner's note, impression and recommendations. Abd pain, distention, constipation, N/V. Hb decrease without change in stool or vomiting. Mild ileus noted on 1/23 films. Takes Meloxicam daily. Had BM today post enema. Suspect this is a mild ileus with constipation. R/O GI bleeding vs hydration leading to Hb decrease with an underlying anemia. Repeat CT with IV contrast. PPI bid. Consider EGD.   Lucio Edward, MD [facility] [phone] Mon-Fri 8a-5p [phone] after 5p, weekends, holidays

## 2015-03-10 NOTE — Op Note (Addendum)
[facility] [place], [address]   ENDOSCOPY PROCEDURE REPORT  PATIENT: Terry Lyons, Terry Lyons  MR#: [id number] BIRTHDATE: [birth year] , 69  yrs. old GENDER: male ENDOSCOPIST: Ladene Artist, MD, [facility] REFERRED BY:  [facility] PROCEDURE DATE:  03/10/2015 PROCEDURE:  EGD w/ biopsy ASA CLASS:     Class III INDICATIONS:  abdominal pain, nausea, vomiting, and abnormal CT of the GI tract. MEDICATIONS: Fentanyl 50 mcg IV, Versed 5 mg IV, and Moderate sedation provided from 1427 to 1445 TOPICAL ANESTHETIC: Cetacaine Spray DESCRIPTION OF PROCEDURE: After the risks benefits and alternatives of the procedure were thoroughly explained, informed consent was obtained.  The Pentax Gastroscope [id number] endoscope was introduced through the mouth and advanced to the second portion of the duodenum , Without limitations.  The instrument was slowly withdrawn as the mucosa was fully examined.    ESOPHAGUS: Several white exudates consistent with candidiasis were found in the entire esophagus. STOMACH: The mucosa of the stomach appeared normal. DUODENUM: Segmental abnormality of duodenal wall from distal bulb into proximal 2nd duodenum.  This area was smooth and protuding into the lumen with a few small erosions otherwise normal appearing mucosa.  It measured approximately 2 x 4 cm.  Possible submucosal process.  Biopsies obtained.  Retroflexed views revealed no abnormalities.  The scope was then withdrawn from the patient and the procedure completed.  COMPLICATIONS: There were no immediate complications.  ENDOSCOPIC IMPRESSION: 1.   White exudates consistent with candidiasis in the entire esophagus 2.  Segmental abnormality of duodenal wall-etiology unclear; biopsies obtained  RECOMMENDATIONS: 1.  Await pathology results 2.  Continue PPI 3.  GI follow up with Dr. Henrene Pastor. Consider repeat CT, repeat EGD/possible EUS in several weeks as outpatient to further  evaluate  4.  Diflucan for 7 days for mild candidiasis   eSigned:  Ladene Artist, MD, [facility] 03/10/2015 3:12 PM Revised: 03/10/2015 3:12 PM

## 2015-03-10 NOTE — Interval H&P Note (Signed)
History and Physical Interval Note:  03/10/2015 1:38 PM  Terry Lyons  has presented today for surgery, with the diagnosis of Nausea, vomiting, abdominal pain, NSAID use  The various methods of treatment have been discussed with the patient and family. After consideration of risks, benefits and other options for treatment, the patient has consented to  Procedure(s): ESOPHAGOGASTRODUODENOSCOPY (EGD) (N/A) as a surgical intervention .  The patient's history has been reviewed, patient examined, no change in status, stable for surgery.  I have reviewed the patient's chart and labs.  Questions were answered to the patient's satisfaction.     Pricilla Riffle. [place] Plan

## 2015-03-10 NOTE — Progress Notes (Addendum)
PROGRESS NOTE  Terry Lyons [id number] DOB: October 16, 1945 DOA: 03/06/2015 PCP: [name], MD   HPI: 70 year old male with past medical history of diabetes, dyslipidemia, hypertension, hypothyroidism has seen cardiology back in December 2016 Dr. Meda Coffee for dyspnea on exertion at which time recommendation was made for risk factor modifications. Patient also has history of GI workup with endoscopy for dysphagia and his most recent dilation was 06/26/2014. He presented to [facility] long hospital with reports of epigastric pain radiating to chest area, sharp, 10 out of 10 in intensity, worse with breathing and no significant alleviating factors.  Subjective / 24 H Interval events - he is hungry today, epigastric pain mild  - also describes a cough on and off for few weeks, more productive - endorses pleuritic chest pain on right   Assessment/Plan: Principal Problem:   Intractable nausea and vomiting Active Problems:   Essential hypertension, benign   Severe obesity (BMI >= 40) (HCC)   Abdominal pain   Uncontrolled diabetes mellitus type 2 without complications (HCC)   Chest pain on breathing   Hypoxemia   Sinus tachycardia (HCC)   Hypothyroidism   Intractable nausea and vomiting and epigastric pain/Ileus: - Unlikely cardiac in etiology, cardiology was consulted and recommended gastrointestinal workup. - GI following, appreciate input, plan for EGD today - CT scan 1/24 showed "distal gastric and proximal duodenal wall thickening with surrounding inflammation and small volume free fluid extending into the subhepatic space" - continue IV Protonix - supposed to take Omeprazole at home but has not been taking it consistently  Essential hypertension, benign - Continue metoprolol and Aldactone.  Possible CAP - patient with recent flu like illness in December, coughing on and off since - cough more productive, with CXR with possible left base infiltrate - low grade temp 2 days ago,  started on Levaquin, continue  Fluid overload - patient with fine crackles on lung exam and LE edema - appears to be up 10 lbs in the past 4 days - he is on home Lasix as needed, will provide a dose IV once he is back from his EGD  AKI: - Multifactorial, prerenal and contrast-induced. - Cr 1.03 this morning  Hypothyroidism - Continue Synthroid. - educated about correct administration, taking it with all morning medications - check a TSH given sinus tach  Severe obesity (BMI >= 40) (HCC)  Uncontrolled diabetes mellitus type 2 without complications ([place]) - Hold metformin start sliding scale insulin.  Sinus tachycardia - thought to be in the setting of dehydration, improved with fluids - cardiology evaluated - will check a TSH   Diet: Diet NPO time specified Fluids: none DVT Prophylaxis: SCD  Code Status: Full Code Family Communication: d/w wife and daughter bedside (daughter is an ED RN at [employer])  Disposition Plan: home when ready   Barriers to discharge: EGD, fluid overload, leukocytosis, IV antibiotics  Consultants:  GI  Cardiology  Procedures:  2D echo  Study Conclusions - Left ventricle: The cavity size was normal. There was mildconcentric hypertrophy. Systolic function was vigorous. Theestimated ejection fraction was in the range of 65% to 70%. Wallmotion was normal; there were no regional wall motionabnormalities. Due to tachycardia, there was fusion of early andatrial contributions to ventricular filling. The study is nottechnically sufficient to allow evaluation of LV diastolicfunction.    Antibiotics Levofloxacin 1/23 >>   Studies  Ct Abdomen Pelvis W Contrast  03/09/2015  CLINICAL DATA:  Diffuse abdominal pain since 03/06/2015 with nausea and vomiting EXAM: CT ABDOMEN AND  PELVIS WITH CONTRAST TECHNIQUE: Multidetector CT imaging of the abdomen and pelvis was performed using the standard protocol following bolus administration of intravenous  contrast. CONTRAST:  120mL OMNIPAQUE IOHEXOL 300 MG/ML  SOLN COMPARISON:  03/06/2015 FINDINGS: Lower chest: Consolidation with air bronchograms in both lung bases new from prior study likely atelectasis. Tiny pleural effusions also new however. Hepatobiliary: Severe fatty infiltration of the liver. Pancreas: Normal Spleen: Normal Adrenals/Urinary Tract: Adrenal glands are normal. Stable symmetric likely chronic bilateral perinephric inflammation. Bladder negative. Stomach/Bowel: There is wall thickening in the antrum of the stomach also involving the duodenum bulb and proximal descending duodenum. There is inflammatory change surrounding the proximal duodenum with fluid and inflammation extending into the subhepatic space on the right.There is a 30mm focus of air and contrast on image 44 in the region of the pancreatic head and adjacent descending duodenum; this was present on the previous (noncontrasted) study and appeared as a 49mm focus of air. Vascular/Lymphatic: Aortic atherosclerosis with calcification and no dilatation. Reproductive: Negative Musculoskeletal: No acute findings IMPRESSION: New from the prior study is distal gastric and proximal duodenal wall thickening with surrounding inflammation and small volume free fluid extending into the subhepatic space. No findings to definitively indicate perforation; a 21mm adjacent collection of air/contrast along the medial border of the descending duodenum could potentially indicate a tiny ulceration, although it occurs in a region in which the wall is already returning to more normal caliber and may simply represent a tiny duodenal diverticulum. In either case, the findings are suspicious for gastritis and duodenitis and may indicate peptic ulcer disease. Electronically Signed   By: Skipper Cliche M.D.   On: 03/09/2015 20:04   Dg Abd Acute W/chest  03/10/2015  CLINICAL DATA:  Diabetes, ileus EXAM: DG ABDOMEN ACUTE W/ 1V CHEST COMPARISON:  03/09/2015 and  03/08/2015 FINDINGS: There is small right pleural effusion with right basilar atelectasis or infiltrate. Patchy atelectasis or infiltrate is noted left base. No pulmonary edema. Moderate gas noted within stomach. Moderate gas noted within transverse colon. Residual contrast noted in distal small bowel and proximal right colon. No small bowel air-fluid levels. Contrast material/ urine noted within urinary bladder. No free abdominal air. IMPRESSION: Small right pleural effusion with right basilar atelectasis or infiltrate. Patchy atelectasis or infiltrate left base. Moderate gas noted within stomach and transverse colon. Some residual contrast noted in distal small bowel. Contrast material/ urine noted within urinary bladder. No free abdominal air. Electronically Signed   By: Lahoma Crocker M.D.   On: 03/10/2015 08:17    Objective  Filed Vitals:   03/09/15 2226 03/10/15 0240 03/10/15 0523 03/10/15 0700  BP: 159/85  136/82   Pulse: 122  118   Temp: 98.7 F (37.1 C) 98.8 F (37.1 C) 98 F (36.7 C)   TempSrc: Oral Oral Oral   Resp: 22  20   Height:      Weight:    145.151 kg (320 lb)  SpO2: 97%  97%     Intake/Output Summary (Last 24 hours) at 03/10/15 1221 Last data filed at 03/09/15 1627  Gross per 24 hour  Intake    150 ml  Output      0 ml  Net    150 ml   Filed Weights   03/07/15 0649 03/08/15 0500 03/10/15 0700  Weight: 141.341 kg (311 lb 9.6 oz) 141.069 kg (311 lb) 145.151 kg (320 lb)   Exam:  GENERAL: NAD  HEENT: PERRL  NECK: supple  LUNGS: no wheezing,  faint bibasilar crackles  HEART: RRR without MRG. LE edema.   ABDOMEN: soft, non tender  EXTREMITIES: no clubbing / cyanosis  NEUROLOGIC: non focal  Data Reviewed: Basic Metabolic Panel:  Recent Labs Lab 03/07/15 0422 03/08/15 0542 03/08/15 1927 03/09/15 0523 03/10/15 0520  NA 134* 134* 132* 135 133*  K 4.6 5.2* 5.1 4.6 4.1  CL 98* 100* 96* 98* 96*  CO2 24 26 26 30 27   GLUCOSE 150* 155* 156* 165* 141*    BUN 21* 31* 21* 19 19  CREATININE 1.07 1.38* 1.17 1.10 1.03  CALCIUM 8.7* 9.0 8.9 9.0 9.1  MG  --   --   --  1.9  --    Liver Function Tests:  Recent Labs Lab 03/06/15 0810 03/07/15 0422 03/10/15 1115  AST 49* 39 33  ALT 36 37 28  ALKPHOS 83 62 81  BILITOT 1.4* 1.2 1.3*  PROT 6.8 7.1 6.5  ALBUMIN 3.6 3.7 2.7*    Recent Labs Lab 03/06/15 1222  LIPASE 33   CBC:  Recent Labs Lab 03/06/15 0810 03/07/15 0422 03/08/15 0542 03/09/15 0523 03/10/15 1115  WBC 6.7 15.4* 18.0* 14.4* 16.1*  HGB 14.2 13.9 13.6 12.4* 12.5*  HCT 42.3 43.0 42.4 37.4* 36.2*  MCV 93.6 97.1 97.0 94.9 93.5  PLT 218 200 185 187 191   CBG:  Recent Labs Lab 03/09/15 1151 03/09/15 1713 03/09/15 2225 03/10/15 0730 03/10/15 1209  GLUCAP 158* 150* 126* 139* 128*   Scheduled Meds: . allopurinol  150 mg Oral QHS  . allopurinol  300 mg Oral Daily  . aspirin EC  81 mg Oral Daily  . bisacodyl  10 mg Rectal Daily  . calcium carbonate  1 tablet Oral Q breakfast  . doxazosin  2 mg Oral QHS  . insulin aspart  0-15 Units Subcutaneous TID WC  . insulin aspart  0-5 Units Subcutaneous QHS  . insulin aspart  3 Units Subcutaneous TID WC  . levalbuterol  0.63 mg Nebulization Q6H  . levofloxacin (LEVAQUIN) IV  750 mg Intravenous Q24H  . levothyroxine  50 mcg Oral QAC breakfast  . loratadine  10 mg Oral Daily  . metoprolol tartrate  12.5 mg Oral BID  . multivitamin with minerals  1 tablet Oral Daily  . omega-3 acid ethyl esters  1 g Oral Daily  . pantoprazole (PROTONIX) IV  40 mg Intravenous Q12H  . simethicone  160 mg Oral TID   Continuous Infusions: . sodium chloride 20 mL/hr at 03/10/15 1027    Marzetta Board, MD [facility] Pager [phone]. If 7 PM - 7 AM, please contact night-coverage at [email or web address], password [email or web address] 03/10/2015, 12:21 PM  LOS: 4 days

## 2015-03-10 NOTE — Progress Notes (Signed)
Patient for EGD today to evaluate abdominal pain, nausea, vomiting and abnormal CT scan with findings suspicious for gastritis/duodenitis, possibly ulcer.

## 2015-03-11 ENCOUNTER — Encounter (HOSPITAL_COMMUNITY): Payer: Self-pay | Admitting: Gastroenterology

## 2015-03-11 DIAGNOSIS — B3781 Candidal esophagitis: Secondary | ICD-10-CM | POA: Diagnosis not present

## 2015-03-11 DIAGNOSIS — R1084 Generalized abdominal pain: Secondary | ICD-10-CM | POA: Diagnosis not present

## 2015-03-11 DIAGNOSIS — R1013 Epigastric pain: Secondary | ICD-10-CM | POA: Diagnosis not present

## 2015-03-11 DIAGNOSIS — K5900 Constipation, unspecified: Secondary | ICD-10-CM

## 2015-03-11 DIAGNOSIS — I5031 Acute diastolic (congestive) heart failure: Secondary | ICD-10-CM | POA: Diagnosis not present

## 2015-03-11 DIAGNOSIS — R1011 Right upper quadrant pain: Secondary | ICD-10-CM | POA: Diagnosis not present

## 2015-03-11 DIAGNOSIS — D649 Anemia, unspecified: Secondary | ICD-10-CM | POA: Diagnosis not present

## 2015-03-11 DIAGNOSIS — R112 Nausea with vomiting, unspecified: Secondary | ICD-10-CM | POA: Diagnosis not present

## 2015-03-11 DIAGNOSIS — I10 Essential (primary) hypertension: Secondary | ICD-10-CM | POA: Diagnosis not present

## 2015-03-11 DIAGNOSIS — R933 Abnormal findings on diagnostic imaging of other parts of digestive tract: Secondary | ICD-10-CM | POA: Diagnosis not present

## 2015-03-11 LAB — COMPREHENSIVE METABOLIC PANEL
ALBUMIN: 2.5 g/dL — AB (ref 3.5–5.0)
ALK PHOS: 93 U/L (ref 38–126)
ALT: 31 U/L (ref 17–63)
AST: 53 U/L — AB (ref 15–41)
Anion gap: 12 (ref 5–15)
BUN: 21 mg/dL — AB (ref 6–20)
CALCIUM: 8.8 mg/dL — AB (ref 8.9–10.3)
CO2: 28 mmol/L (ref 22–32)
CREATININE: 1.12 mg/dL (ref 0.61–1.24)
Chloride: 94 mmol/L — ABNORMAL LOW (ref 101–111)
GFR calc Af Amer: >60 mL/min (ref >60)
GFR calc non Af Amer: >60 mL/min (ref >60)
GLUCOSE: 153 mg/dL — AB (ref 65–99)
Potassium: 4.2 mmol/L (ref 3.5–5.1)
SODIUM: 134 mmol/L — AB (ref 135–145)
Total Bilirubin: 2.3 mg/dL — ABNORMAL HIGH (ref 0.3–1.2)
Total Protein: 6.1 g/dL — ABNORMAL LOW (ref 6.5–8.1)

## 2015-03-11 LAB — GLUCOSE, CAPILLARY
GLUCOSE-CAPILLARY: 134 mg/dL — AB (ref 65–99)
GLUCOSE-CAPILLARY: 116 mg/dL — AB (ref 65–99)
Glucose-Capillary: 146 mg/dL — ABNORMAL HIGH (ref 65–99)
Glucose-Capillary: 157 mg/dL — ABNORMAL HIGH (ref 65–99)

## 2015-03-11 LAB — CBC
HCT: 37.1 % — ABNORMAL LOW (ref 39.0–52.0)
HEMOGLOBIN: 12.2 g/dL — AB (ref 13.0–17.0)
MCH: 30.9 pg (ref 26.0–34.0)
MCHC: 32.9 g/dL (ref 30.0–36.0)
MCV: 93.9 fL (ref 78.0–100.0)
Platelets: 230 10*3/uL (ref 150–400)
RBC: 3.95 MIL/uL — AB (ref 4.22–5.81)
RDW: 15.3 % (ref 11.5–15.5)
WBC: 15.4 10*3/uL — AB (ref 4.0–10.5)

## 2015-03-11 LAB — LIPASE, BLOOD: Lipase: 38 U/L (ref 11–51)

## 2015-03-11 MED ORDER — FUROSEMIDE 10 MG/ML IJ SOLN
60.0000 mg | Freq: Once | INTRAMUSCULAR | Status: AC
  Administered 2015-03-11: 60 mg via INTRAVENOUS
  Filled 2015-03-11: qty 6

## 2015-03-11 MED ORDER — FUROSEMIDE 10 MG/ML IJ SOLN
60.0000 mg | Freq: Two times a day (BID) | INTRAMUSCULAR | Status: DC
  Administered 2015-03-11 – 2015-03-12 (×2): 60 mg via INTRAVENOUS
  Filled 2015-03-11 (×2): qty 6

## 2015-03-11 MED ORDER — LEVALBUTEROL HCL 0.63 MG/3ML IN NEBU: 0.6300 mg | INHALATION_SOLUTION | Freq: Four times a day (QID) | RESPIRATORY_TRACT | Status: DC

## 2015-03-11 MED ORDER — ALBUTEROL SULFATE (2.5 MG/3ML) 0.083% IN NEBU
2.5000 mg | INHALATION_SOLUTION | RESPIRATORY_TRACT | Status: DC
  Filled 2015-03-11: qty 3

## 2015-03-11 MED ORDER — LEVALBUTEROL HCL 0.63 MG/3ML IN NEBU
0.6300 mg | INHALATION_SOLUTION | RESPIRATORY_TRACT | Status: DC
  Filled 2015-03-11: qty 3

## 2015-03-11 MED ORDER — LEVALBUTEROL HCL 0.63 MG/3ML IN NEBU
0.6300 mg | INHALATION_SOLUTION | RESPIRATORY_TRACT | Status: DC
  Administered 2015-03-11 – 2015-03-12 (×3): 0.63 mg via RESPIRATORY_TRACT
  Filled 2015-03-11 (×3): qty 3

## 2015-03-11 NOTE — Progress Notes (Addendum)
[facility] PROGRESS NOTE  KONNOR BOLITHO [id number] DOB: 05/18/1945 DOA: 03/06/2015 PCP: [name], MD  Brief narrative 70 year old morbidly obese male with history of type 2 diabetes mellitus, hypertension, hypothyroidism who was previously evaluated by cardiology for dyspnea on exertion thought to be secondary to underlying obesity and physical deconditioning and recommended for risk factor modifications, history of dysphagia with EGD done on 06/2014 with esophageal dilatation (for possible ring) presented to the ED with severe sharp epigastric pain worsened with breathing and without any relieving factors. He had associated nausea and vomiting. He has been chronically on NSAIDs using meloxicam. Patient seen by GI and underwent EGD showing Candida esophagitis with segmental abnormality of the duodenal wall. Also found to have a left lobar pneumonia and wheezing.  Assessment/Plan: Epigastric pain with intractable nausea and vomiting with? Ileus CT abdomen from 1/24 showed distal gastric and proximal duodenal wall thickening with surrounding inflammation. Placed on IV PPI. Seen by GI and underwent EGD on 1/25 showing Candida esophagitis with segmental abnormality of the duodenal wall. Biopsy taken. -Abdominal pain slightly better this morning. Continue IV PPI. Diet advanced to full liquid. -Continue fluconazole for 7 day course. -Encouraged ambulation. Patient is to follow-up with Dr. Henrene Pastor (appointment scheduled for 2/13 at 10 AM)  Acute diastolic CHF Patient had volume overload while in the hospital with almost 10 pound weight gain since admission. He is on oral Lasix at home. Activity wheezing this morning. Placed on IV Lasix 60 mg twice a day. Monitor strict I/O and daily weight. 2-D echo shows EF of 65-70% with normal wall motion abnormality. -Added scheduled albuterol nebulizer for wheezing.  Acute kidney injury Possibly prerenal and secondary to contrast. Renal function stable.  Monitor while on Lasix.  Lobar pneumonia Having productive cough with chest x-ray showing possible left base infiltrate. Also had low-grade fever. Monitor on Levaquin.  Essential hypertension Continue metoprolol and Aldactone.   type 2 diabetes mellitus with hyperglycemia Continue premeal  aspart and sliding scale coverage. FSG stable. A1c of 7.1. Holding metformin  Morbid obesity Needs counseling on diet and exercise to lose weight.  Sinus tachycardia Possibly with dyspnea and deconditioning. TSH normal. CT angiogram of  the chest done on admission was negative for PE. Monitor on telemetry.  DVT prophylaxis Diet: Full liquid  Code Status: Full code Family Communication: Wife at bedside Disposition Plan: Home once breathing improved   Consultants:  Cardiology  [facility]  Procedures:  CT abdomen and pelvis  2-D echo  EGD  Antibiotics:  Levaquin  HPI/Subjective: Seen and examined. Still has epigastric pain but better than yesterday. No nausea vomiting patient actually wheezing.  Objective: Filed Vitals:   03/11/15 0529 03/11/15 1100  BP: 175/92 152/82  Pulse: 124 108  Temp: 98.3 F (36.8 C)   Resp: 20     Intake/Output Summary (Last 24 hours) at 03/11/15 1255 Last data filed at 03/10/15 2200  Gross per 24 hour  Intake    241 ml  Output   1070 ml  Net   -829 ml   Filed Weights   03/08/15 0500 03/10/15 0700 03/11/15 0528  Weight: 141.069 kg (311 lb) 145.151 kg (320 lb) 141.114 kg (311 lb 1.6 oz)    Exam:   General:  Elderly obese male lying in bed in some discomfort  HEENT: Moist mucosa  Chest: Diffuse wheezing bilaterally  Cardiovascular: S1 and S2 tachycardic, no murmurs rub or gallop  GI: Soft, mild distention with epigastric tenderness, bowel sounds present  Musculoskeletal:  Warm, trace edema bilaterally    Data Reviewed: Basic Metabolic Panel:  Recent Labs Lab 03/08/15 0542 03/08/15 1927 03/09/15 0523 03/10/15 0520  03/11/15 0630  NA 134* 132* 135 133* 134*  K 5.2* 5.1 4.6 4.1 4.2  CL 100* 96* 98* 96* 94*  CO2 26 26 30 27 28   GLUCOSE 155* 156* 165* 141* 153*  BUN 31* 21* 19 19 21*  CREATININE 1.38* 1.17 1.10 1.03 1.12  CALCIUM 9.0 8.9 9.0 9.1 8.8*  MG  --   --  1.9  --   --    Liver Function Tests:  Recent Labs Lab 03/06/15 0810 03/07/15 0422 03/10/15 1115 03/11/15 0630  AST 49* 39 33 53*  ALT 36 37 28 31  ALKPHOS 83 62 81 93  BILITOT 1.4* 1.2 1.3* 2.3*  PROT 6.8 7.1 6.5 6.1*  ALBUMIN 3.6 3.7 2.7* 2.5*    Recent Labs Lab 03/06/15 1222 03/11/15 0630  LIPASE 33 38   No results for input(s): AMMONIA in the last 168 hours. CBC:  Recent Labs Lab 03/07/15 0422 03/08/15 0542 03/09/15 0523 03/10/15 1115 03/11/15 0453  WBC 15.4* 18.0* 14.4* 16.1* 15.4*  HGB 13.9 13.6 12.4* 12.5* 12.2*  HCT 43.0 42.4 37.4* 36.2* 37.1*  MCV 97.1 97.0 94.9 93.5 93.9  PLT 200 185 187 191 230   Cardiac Enzymes: No results for input(s): CKTOTAL, CKMB, CKMBINDEX, TROPONINI in the last 168 hours. BNP (last 3 results)  Recent Labs  03/10/15 1105  BNP 150.4*    ProBNP (last 3 results) No results for input(s): PROBNP in the last 8760 hours.  CBG:  Recent Labs Lab 03/10/15 1209 03/10/15 1610 03/10/15 2112 03/11/15 0751 03/11/15 1154  GLUCAP 128* 113* 140* 146* 157*    No results found for this or any previous visit (from the past 240 hour(s)).   Studies: Ct Abdomen Pelvis W Contrast  03/09/2015  CLINICAL DATA:  Diffuse abdominal pain since 03/06/2015 with nausea and vomiting EXAM: CT ABDOMEN AND PELVIS WITH CONTRAST TECHNIQUE: Multidetector CT imaging of the abdomen and pelvis was performed using the standard protocol following bolus administration of intravenous contrast. CONTRAST:  151mL OMNIPAQUE IOHEXOL 300 MG/ML  SOLN COMPARISON:  03/06/2015 FINDINGS: Lower chest: Consolidation with air bronchograms in both lung bases new from prior study likely atelectasis. Tiny pleural effusions  also new however. Hepatobiliary: Severe fatty infiltration of the liver. Pancreas: Normal Spleen: Normal Adrenals/Urinary Tract: Adrenal glands are normal. Stable symmetric likely chronic bilateral perinephric inflammation. Bladder negative. Stomach/Bowel: There is wall thickening in the antrum of the stomach also involving the duodenum bulb and proximal descending duodenum. There is inflammatory change surrounding the proximal duodenum with fluid and inflammation extending into the subhepatic space on the right.There is a 63mm focus of air and contrast on image 44 in the region of the pancreatic head and adjacent descending duodenum; this was present on the previous (noncontrasted) study and appeared as a 34mm focus of air. Vascular/Lymphatic: Aortic atherosclerosis with calcification and no dilatation. Reproductive: Negative Musculoskeletal: No acute findings IMPRESSION: New from the prior study is distal gastric and proximal duodenal wall thickening with surrounding inflammation and small volume free fluid extending into the subhepatic space. No findings to definitively indicate perforation; a 58mm adjacent collection of air/contrast along the medial border of the descending duodenum could potentially indicate a tiny ulceration, although it occurs in a region in which the wall is already returning to more normal caliber and may simply represent a tiny duodenal diverticulum. In either case,  the findings are suspicious for gastritis and duodenitis and may indicate peptic ulcer disease. Electronically Signed   By: Skipper Cliche M.D.   On: 03/09/2015 20:04   Dg Abd Acute W/chest  03/10/2015  CLINICAL DATA:  Diabetes, ileus EXAM: DG ABDOMEN ACUTE W/ 1V CHEST COMPARISON:  03/09/2015 and 03/08/2015 FINDINGS: There is small right pleural effusion with right basilar atelectasis or infiltrate. Patchy atelectasis or infiltrate is noted left base. No pulmonary edema. Moderate gas noted within stomach. Moderate gas noted  within transverse colon. Residual contrast noted in distal small bowel and proximal right colon. No small bowel air-fluid levels. Contrast material/ urine noted within urinary bladder. No free abdominal air. IMPRESSION: Small right pleural effusion with right basilar atelectasis or infiltrate. Patchy atelectasis or infiltrate left base. Moderate gas noted within stomach and transverse colon. Some residual contrast noted in distal small bowel. Contrast material/ urine noted within urinary bladder. No free abdominal air. Electronically Signed   By: Lahoma Crocker M.D.   On: 03/10/2015 08:17    Scheduled Meds: . albuterol  2.5 mg Nebulization Q4H  . allopurinol  150 mg Oral QHS  . allopurinol  300 mg Oral Daily  . aspirin EC  81 mg Oral Daily  . bisacodyl  10 mg Rectal Daily  . calcium carbonate  1 tablet Oral Q breakfast  . doxazosin  2 mg Oral QHS  . fluconazole  100 mg Oral Daily  . furosemide  60 mg Intravenous BID  . insulin aspart  0-15 Units Subcutaneous TID WC  . insulin aspart  0-5 Units Subcutaneous QHS  . insulin aspart  3 Units Subcutaneous TID WC  . levofloxacin (LEVAQUIN) IV  750 mg Intravenous Q24H  . levothyroxine  50 mcg Oral QAC breakfast  . loratadine  10 mg Oral Daily  . metoprolol tartrate  12.5 mg Oral BID  . multivitamin with minerals  1 tablet Oral Daily  . omega-3 acid ethyl esters  1 g Oral Daily  . pantoprazole (PROTONIX) IV  40 mg Intravenous Q12H  . polyethylene glycol  17 g Oral BID  . simethicone  160 mg Oral TID   Continuous Infusions:    Time spent: 25 minutes    Esti Demello, [place]  [facility] Pager [phone]. If 7PM-7AM, please contact night-coverage at [email or web address], password [email or web address] 03/11/2015, 12:55 PM  LOS: 5 days

## 2015-03-11 NOTE — Progress Notes (Signed)
Duodenal biopsies show duodenitis. Continue PPI.

## 2015-03-11 NOTE — Progress Notes (Signed)
[facility] Progress Note  Subjective:  Tolerating clears and diet has been advanced to full liquids now.  Very sleepy and not moving around much. Had a large BM early this morning.   Objective:  Vital signs in last 24 hours: Temp:  [97.9 F (36.6 C)-99.1 F (37.3 C)] 98.3 F (36.8 C) (01/26 0529) Pulse Rate:  [99-125] 124 (01/26 0529) Resp:  [18-33] 20 (01/26 0529) BP: (152-246)/(82-146) 175/92 mmHg (01/26 0529) SpO2:  [21 %-98 %] 92 % (01/26 0817) Weight:  [311 lb 1.6 oz (141.114 kg)] 311 lb 1.6 oz (141.114 kg) (01/26 0528) Last BM Date: 03/11/15 (Per report) General:  Alert, Well-developed, in NAD Heart:  Tachy; no murmurs Pulm:  CTAB.  No W/R/R. Abdomen:  Soft, obese, non-distended. Bowel sounds present.  Mild epigastric TTP. Extremities:  1-2+ pitting edema noted in B/L LE's Neurologic:  Alert and oriented x 4;  grossly normal neurologically. Psych:  Alert and cooperative. Normal mood and affect.  Intake/Output from previous day: 01/25 0701 - 01/26 0700 In: 241 [I.V.:91; IV Piggyback:150] Out: 1070 [Urine:1070]  Lab Results:  Recent Labs  03/09/15 0523 03/10/15 1115 03/11/15 0453  WBC 14.4* 16.1* 15.4*  HGB 12.4* 12.5* 12.2*  HCT 37.4* 36.2* 37.1*  PLT 187 191 230   BMET  Recent Labs  03/09/15 0523 03/10/15 0520 03/11/15 0630  NA 135 133* 134*  K 4.6 4.1 4.2  CL 98* 96* 94*  CO2 30 27 28   GLUCOSE 165* 141* 153*  BUN 19 19 21*  CREATININE 1.10 1.03 1.12  CALCIUM 9.0 9.1 8.8*   LFT  Recent Labs  03/10/15 1115 03/11/15 0630  PROT 6.5 6.1*  ALBUMIN 2.7* 2.5*  AST 33 53*  ALT 28 31  ALKPHOS 81 93  BILITOT 1.3* 2.3*  BILIDIR 0.4  --   IBILI 0.9  --    Ct Abdomen Pelvis W Contrast  03/09/2015  CLINICAL DATA:  Diffuse abdominal pain since 03/06/2015 with nausea and vomiting EXAM: CT ABDOMEN AND PELVIS WITH CONTRAST TECHNIQUE: Multidetector CT imaging of the abdomen and pelvis was performed using the standard protocol following  bolus administration of intravenous contrast. CONTRAST:  125mL OMNIPAQUE IOHEXOL 300 MG/ML  SOLN COMPARISON:  03/06/2015 FINDINGS: Lower chest: Consolidation with air bronchograms in both lung bases new from prior study likely atelectasis. Tiny pleural effusions also new however. Hepatobiliary: Severe fatty infiltration of the liver. Pancreas: Normal Spleen: Normal Adrenals/Urinary Tract: Adrenal glands are normal. Stable symmetric likely chronic bilateral perinephric inflammation. Bladder negative. Stomach/Bowel: There is wall thickening in the antrum of the stomach also involving the duodenum bulb and proximal descending duodenum. There is inflammatory change surrounding the proximal duodenum with fluid and inflammation extending into the subhepatic space on the right.There is a 95mm focus of air and contrast on image 44 in the region of the pancreatic head and adjacent descending duodenum; this was present on the previous (noncontrasted) study and appeared as a 69mm focus of air. Vascular/Lymphatic: Aortic atherosclerosis with calcification and no dilatation. Reproductive: Negative Musculoskeletal: No acute findings IMPRESSION: New from the prior study is distal gastric and proximal duodenal wall thickening with surrounding inflammation and small volume free fluid extending into the subhepatic space. No findings to definitively indicate perforation; a 32mm adjacent collection of air/contrast along the medial border of the descending duodenum could potentially indicate a tiny ulceration, although it occurs in a region in which the wall is already returning to more normal caliber and may simply represent a tiny  duodenal diverticulum. In either case, the findings are suspicious for gastritis and duodenitis and may indicate peptic ulcer disease. Electronically Signed   By: Skipper Cliche M.D.   On: 03/09/2015 20:04   Dg Abd Acute W/chest  03/10/2015  CLINICAL DATA:  Diabetes, ileus EXAM: DG ABDOMEN ACUTE W/ 1V CHEST  COMPARISON:  03/09/2015 and 03/08/2015 FINDINGS: There is small right pleural effusion with right basilar atelectasis or infiltrate. Patchy atelectasis or infiltrate is noted left base. No pulmonary edema. Moderate gas noted within stomach. Moderate gas noted within transverse colon. Residual contrast noted in distal small bowel and proximal right colon. No small bowel air-fluid levels. Contrast material/ urine noted within urinary bladder. No free abdominal air. IMPRESSION: Small right pleural effusion with right basilar atelectasis or infiltrate. Patchy atelectasis or infiltrate left base. Moderate gas noted within stomach and transverse colon. Some residual contrast noted in distal small bowel. Contrast material/ urine noted within urinary bladder. No free abdominal air. Electronically Signed   By: Lahoma Crocker M.D.   On: 03/10/2015 08:17    Assessment / Plan: *70 year old male admitted with ileus type picture with complaints of diffuse abdominal pain, nausea, and vomiting. Also noted to have a Hgb that is trending down that has decreased by 2-3 grams from baseline (likely somewhat dilutional). Chronic NSAID use with Meloxicam.  EGD, 1/25,showed candida esophagitis and segmental abnormality of the duodenal wall; biopsies taken. *PNA: On antibiotics. Improving.  -Continue pantoprazole 40 mg IV BID for now. -Fluconazole 100 mg daily for 7 days. -Follow-up biopsies.  Appt scheduled with Dr. Henrene Pastor for follow-up on 2/13 at 10:00 am to discuss further evaluation. -He needs to get up and move around.  Will see about have PT see him.   LOS: 5 days   ZEHR, JESSICA D.  03/11/2015, 10:48 AM  Pager number [phone]     Attending physician's note   I have taken an interval history, reviewed the chart and examined the patient. I agree with the Advanced Practitioner's note, impression and recommendations. Continue PPI IV bid for now and change to PO bid in a few days. Follow up on duodenal biopsies.  Continue Miralax for constipation in hospital and as outpatient. Complete 7 days of Dilflucan. Advance diet gradually as tolerated. Outpatient GI follow up with Dr. Henrene Pastor as above. GI signing off.   Lucio Edward, MD [facility] [phone] Mon-Fri 8a-5p [phone] after 5p, weekends, holidays

## 2015-03-12 DIAGNOSIS — I5031 Acute diastolic (congestive) heart failure: Secondary | ICD-10-CM

## 2015-03-12 DIAGNOSIS — I10 Essential (primary) hypertension: Secondary | ICD-10-CM | POA: Diagnosis not present

## 2015-03-12 DIAGNOSIS — R1013 Epigastric pain: Secondary | ICD-10-CM | POA: Diagnosis not present

## 2015-03-12 DIAGNOSIS — R000 Tachycardia, unspecified: Secondary | ICD-10-CM | POA: Diagnosis not present

## 2015-03-12 DIAGNOSIS — B3781 Candidal esophagitis: Secondary | ICD-10-CM | POA: Diagnosis not present

## 2015-03-12 LAB — GLUCOSE, CAPILLARY
GLUCOSE-CAPILLARY: 153 mg/dL — AB (ref 65–99)
Glucose-Capillary: 220 mg/dL — ABNORMAL HIGH (ref 65–99)
Glucose-Capillary: 173 mg/dL — ABNORMAL HIGH (ref 65–99)
Glucose-Capillary: 169 mg/dL — ABNORMAL HIGH (ref 65–99)

## 2015-03-12 MED ORDER — TEMAZEPAM 15 MG PO CAPS
15.0000 mg | ORAL_CAPSULE | Freq: Once | ORAL | Status: AC
  Administered 2015-03-12: 15 mg via ORAL
  Filled 2015-03-12: qty 1

## 2015-03-12 MED ORDER — POLYETHYLENE GLYCOL 3350 17 G PO PACK
17.0000 g | PACK | Freq: Every day | ORAL | Status: DC
  Administered 2015-03-12 – 2015-03-13 (×2): 17 g via ORAL
  Filled 2015-03-12 (×2): qty 1

## 2015-03-12 MED ORDER — FUROSEMIDE 10 MG/ML IJ SOLN
40.0000 mg | Freq: Two times a day (BID) | INTRAMUSCULAR | Status: DC
  Administered 2015-03-12 – 2015-03-13 (×2): 40 mg via INTRAVENOUS
  Filled 2015-03-12 (×2): qty 4

## 2015-03-12 MED ORDER — LEVALBUTEROL HCL 0.63 MG/3ML IN NEBU: 0.6300 mg | INHALATION_SOLUTION | Freq: Three times a day (TID) | RESPIRATORY_TRACT | Status: DC

## 2015-03-12 MED ORDER — LEVALBUTEROL HCL 0.63 MG/3ML IN NEBU: 0.6300 mg | INHALATION_SOLUTION | Freq: Three times a day (TID) | RESPIRATORY_TRACT | Status: DC | PRN

## 2015-03-12 MED ORDER — METOPROLOL TARTRATE 50 MG PO TABS
50.0000 mg | ORAL_TABLET | Freq: Two times a day (BID) | ORAL | Status: DC
  Administered 2015-03-12 – 2015-03-13 (×2): 50 mg via ORAL
  Filled 2015-03-12 (×2): qty 1

## 2015-03-12 NOTE — Progress Notes (Signed)
[facility] PROGRESS NOTE  Terry Lyons [id number] DOB: 12-12-1945 DOA: 03/06/2015 PCP: [name], MD  Brief narrative 70 year old morbidly obese male with history of type 2 diabetes mellitus, hypertension, hypothyroidism who was previously evaluated by cardiology for dyspnea on exertion thought to be secondary to underlying obesity and physical deconditioning and recommended for risk factor modifications, history of dysphagia with EGD done on 06/2014 with esophageal dilatation (for possible ring) presented to the ED with severe sharp epigastric pain worsened with breathing and without any relieving factors. He had associated nausea and vomiting. He has been chronically on NSAIDs using meloxicam. Patient seen by GI and underwent EGD showing Candida esophagitis with segmental abnormality of the duodenal wall. Also found to have a left lobar pneumonia and wheezing.  Assessment/Plan: Epigastric pain with intractable nausea and vomiting with? Ileus CT abdomen from 1/24 showed distal gastric and proximal duodenal wall thickening with surrounding inflammation. Placed on IV PPI. Seen by GI and underwent EGD on 1/25 showing Candida esophagitis with segmental abnormality of the duodenal wall. Biopsy taken. -Abdominal pain slightly better this morning. Continue IV PPI. Diet advanced to full liquid. -Continue fluconazole for 7 day course. -Symptoms much better today. Ambulating with PT.Marland Kitchen Patient is to follow-up with Dr. Henrene Pastor (appointment scheduled for 2/13 at 10 AM)  Acute diastolic CHF Patient had volume overload while in the hospital with almost 10 pound weight gain since admission. He is on oral Lasix at home as needed. Wound to be short of breath and actively wheezing on 1/26 placed on IV Lasix 60 twice a day. Times much improved today. 2-D echo showing EF of 65-70% with normal wall motion. Continue when necessary nebulizer. -Patient will need daily Lasix upon discharge.  Acute kidney  injury Possibly prerenal and secondary to contrast. Renal function stable. Continue with IV Lasix for today.  Lobar pneumonia Having productive cough with chest x-ray showing possible left base infiltrate. Also had low-grade fever. Tinea Levaquin. Having scanty hemoptysis. Add antitussive.   Essential hypertension Continue metoprolol and Aldactone. Increased dose of metoprolol.   type 2 diabetes mellitus with hyperglycemia Continue premeal  aspart and sliding scale coverage. FSG stable. A1c of 7.1. Holding metformin  Morbid obesity Needs counseling on diet and exercise to lose weight.  Sinus tachycardia Possibly with dyspnea and deconditioning. TSH normal. CT angiogram of  the chest done on admission was negative for PE. Will increase dose of metoprolol.    DVT prophylaxis Diet: Heart healthy/diabetic  Code Status: Full code Family Communication: Wife and daughter at bedside Disposition Plan: Home in a.m. if continues to improve.   Consultants:  Cardiology  [facility]  Procedures:  CT abdomen and pelvis  2-D echo  EGD  Antibiotics:  Levaquin  HPI/Subjective: Seen and examined. Dyspnea much improved today. Abdominal pain and distention resolved.  Objective: Filed Vitals:   03/12/15 0424 03/12/15 0907  BP: 146/85   Pulse: 109 126  Temp: 97.6 F (36.4 C)   Resp: 18     Intake/Output Summary (Last 24 hours) at 03/12/15 1304 Last data filed at 03/12/15 1214  Gross per 24 hour  Intake    750 ml  Output   1900 ml  Net  -1150 ml   Filed Weights   03/10/15 0700 03/11/15 0528 03/12/15 0424  Weight: 145.151 kg (320 lb) 141.114 kg (311 lb 1.6 oz) 139.254 kg (307 lb)    Exam:   General:  Elderly obese male not in distress  HEENT: Moist mucosa  Chest: Diminished  bibasilar breath sounds, no added sounds  Cardiovascular: S1 and S2 tachycardic, no murmurs rub or gallop  GI: Soft, mild distention with no epigastric tenderness,, bowel sounds  present  Musculoskeletal: Warm, 1+ pitting edema bilaterally (chronic as per wife)  CNS: Alert and oriented    Data Reviewed: Basic Metabolic Panel:  Recent Labs Lab 03/08/15 0542 03/08/15 1927 03/09/15 0523 03/10/15 0520 03/11/15 0630  NA 134* 132* 135 133* 134*  K 5.2* 5.1 4.6 4.1 4.2  CL 100* 96* 98* 96* 94*  CO2 26 26 30 27 28   GLUCOSE 155* 156* 165* 141* 153*  BUN 31* 21* 19 19 21*  CREATININE 1.38* 1.17 1.10 1.03 1.12  CALCIUM 9.0 8.9 9.0 9.1 8.8*  MG  --   --  1.9  --   --    Liver Function Tests:  Recent Labs Lab 03/06/15 0810 03/07/15 0422 03/10/15 1115 03/11/15 0630  AST 49* 39 33 53*  ALT 36 37 28 31  ALKPHOS 83 62 81 93  BILITOT 1.4* 1.2 1.3* 2.3*  PROT 6.8 7.1 6.5 6.1*  ALBUMIN 3.6 3.7 2.7* 2.5*    Recent Labs Lab 03/06/15 1222 03/11/15 0630  LIPASE 33 38   No results for input(s): AMMONIA in the last 168 hours. CBC:  Recent Labs Lab 03/07/15 0422 03/08/15 0542 03/09/15 0523 03/10/15 1115 03/11/15 0453  WBC 15.4* 18.0* 14.4* 16.1* 15.4*  HGB 13.9 13.6 12.4* 12.5* 12.2*  HCT 43.0 42.4 37.4* 36.2* 37.1*  MCV 97.1 97.0 94.9 93.5 93.9  PLT 200 185 187 191 230   Cardiac Enzymes: No results for input(s): CKTOTAL, CKMB, CKMBINDEX, TROPONINI in the last 168 hours. BNP (last 3 results)  Recent Labs  03/10/15 1105  BNP 150.4*    ProBNP (last 3 results) No results for input(s): PROBNP in the last 8760 hours.  CBG:  Recent Labs Lab 03/11/15 0751 03/11/15 1154 03/11/15 1739 03/11/15 2145 03/12/15 0754  GLUCAP 146* 157* 134* 116* 153*    No results found for this or any previous visit (from the past 240 hour(s)).   Studies: No results found.  Scheduled Meds: . allopurinol  150 mg Oral QHS  . allopurinol  300 mg Oral Daily  . aspirin EC  81 mg Oral Daily  . bisacodyl  10 mg Rectal Daily  . calcium carbonate  1 tablet Oral Q breakfast  . doxazosin  2 mg Oral QHS  . fluconazole  100 mg Oral Daily  . furosemide  60 mg  Intravenous BID  . insulin aspart  0-15 Units Subcutaneous TID WC  . insulin aspart  0-5 Units Subcutaneous QHS  . insulin aspart  3 Units Subcutaneous TID WC  . levalbuterol  0.63 mg Nebulization TID  . levofloxacin (LEVAQUIN) IV  750 mg Intravenous Q24H  . levothyroxine  50 mcg Oral QAC breakfast  . loratadine  10 mg Oral Daily  . metoprolol tartrate  50 mg Oral BID  . multivitamin with minerals  1 tablet Oral Daily  . omega-3 acid ethyl esters  1 g Oral Daily  . pantoprazole (PROTONIX) IV  40 mg Intravenous Q12H  . polyethylene glycol  17 g Oral Daily  . simethicone  160 mg Oral TID   Continuous Infusions:    Time spent: 25 minutes    Terry Lyons, [place]  [facility] Pager [phone]. If 7PM-7AM, please contact night-coverage at [email or web address], password [facility] 03/12/2015, 1:04 PM  LOS: 6 days

## 2015-03-12 NOTE — Evaluation (Signed)
Physical Therapy Evaluation Patient Details Name: Terry Lyons MRN: [id number] DOB: 1945-11-05 Today's Date: 03/12/2015   History of Present Illness  70 yo male admitted with intractable nausea, vomiting, abd pain. Hx of DM, HTN.   Clinical Impression  On eval, pt required Min guard assist for mobility-walked ~15'x2 in room (with and without walker). O2 sats 91% RA during ambulation, HR 123 bpm. Dyspnea 2/4. Discussed d/c plan-pt agreeable to HHPT follow up. Wife present during session-states pt has improved since yesterday. Will follow and progress activity as tolerated.    Follow Up Recommendations Home health PT;Supervision/Assistance - 24 hour    Equipment Recommendations  None recommended by PT    Recommendations for Other Services       Precautions / Restrictions Precautions Precautions: Fall Restrictions Weight Bearing Restrictions: No      Mobility  Bed Mobility Overal bed mobility: Needs Assistance Bed Mobility: Supine to Sit;Sit to Supine     Supine to sit: HOB elevated;Supervision Sit to supine: HOB elevated;Supervision      Transfers Overall transfer level: Needs assistance Equipment used: Rolling walker (2 wheeled) Transfers: Sit to/from Stand Sit to Stand: Min guard         General transfer comment: close guard for safety  Ambulation/Gait Ambulation/Gait assistance: Min guard Ambulation Distance (Feet): 15 Feet (x2) Assistive device: None;Rolling walker (2 wheeled) Gait Pattern/deviations: Step-through pattern;Decreased stride length     General Gait Details: walked to bathroom with walker, from bathroom without device. close guard for safety. slightly unsteady but no LOB. O2 91% RA  Stairs            Wheelchair Mobility    Modified Rankin (Stroke Patients Only)       Balance Overall balance assessment: Needs assistance         Standing balance support: During functional activity Standing balance-Leahy Scale: Good                                Pertinent Vitals/Pain Pain Assessment: 0-10 Pain Score: 4  Pain Location: abdomen Pain Descriptors / Indicators: Sore;Aching Pain Intervention(s): Monitored during session    Home Living Family/patient expects to be discharged to:: Private residence Living Arrangements: Spouse/significant other Available Help at Discharge: Family Type of Home: House Home Access: Stairs to enter Entrance Stairs-Rails: Right Entrance Stairs-Number of Steps: 4 Home Layout: One level Home Equipment: Cane - single point      Prior Function Level of Independence: Needs assistance   Gait / Transfers Assistance Needed: ambulatory without device-short distances  ADL's / Homemaking Assistance Needed: assist with dressing. per wife, pt could step into tub        Hand Dominance        Extremity/Trunk Assessment   Upper Extremity Assessment: Overall WFL for tasks assessed           Lower Extremity Assessment: Generalized weakness      Cervical / Trunk Assessment: Normal  Communication   Communication: No difficulties  Cognition Arousal/Alertness: Awake/alert Behavior During Therapy: WFL for tasks assessed/performed Overall Cognitive Status: Within Functional Limits for tasks assessed                      General Comments      Exercises        Assessment/Plan    PT Assessment Patient needs continued PT services  PT Diagnosis Difficulty walking;Generalized weakness;Acute pain   PT Problem List  Decreased strength;Decreased activity tolerance;Decreased balance;Decreased mobility;Pain;Decreased knowledge of use of DME;Obesity  PT Treatment Interventions DME instruction;Gait training;Therapeutic activities;Patient/family education;Functional mobility training;Therapeutic exercise;Balance training   PT Goals (Current goals can be found in the Care Plan section) Acute Rehab PT Goals Patient Stated Goal: home soon PT Goal Formulation: With  patient/family Time For Goal Achievement: 03/26/15 Potential to Achieve Goals: Good    Frequency Min 3X/week   Barriers to discharge        Co-evaluation               End of Session   Activity Tolerance: Patient tolerated treatment well Patient left: in bed;with call bell/phone within reach;with family/visitor present           Time: 1002-1021 PT Time Calculation (min) (ACUTE ONLY): 19 min   Charges:   PT Evaluation $PT Eval Moderate Complexity: 1 Procedure     PT G Codes:        Terry Lyons, MPT Pager: [phone]

## 2015-03-13 DIAGNOSIS — R000 Tachycardia, unspecified: Secondary | ICD-10-CM | POA: Diagnosis not present

## 2015-03-13 DIAGNOSIS — B3781 Candidal esophagitis: Secondary | ICD-10-CM | POA: Diagnosis not present

## 2015-03-13 DIAGNOSIS — I5031 Acute diastolic (congestive) heart failure: Secondary | ICD-10-CM | POA: Diagnosis not present

## 2015-03-13 DIAGNOSIS — I10 Essential (primary) hypertension: Secondary | ICD-10-CM | POA: Diagnosis not present

## 2015-03-13 DIAGNOSIS — R1013 Epigastric pain: Secondary | ICD-10-CM | POA: Diagnosis not present

## 2015-03-13 LAB — BASIC METABOLIC PANEL
Anion gap: 14 (ref 5–15)
BUN: 20 mg/dL (ref 6–20)
CHLORIDE: 91 mmol/L — AB (ref 101–111)
CO2: 33 mmol/L — AB (ref 22–32)
CREATININE: 1.32 mg/dL — AB (ref 0.61–1.24)
Calcium: 8.9 mg/dL (ref 8.9–10.3)
GFR calc non Af Amer: 53 mL/min — ABNORMAL LOW (ref >60)
Glucose, Bld: 171 mg/dL — ABNORMAL HIGH (ref 65–99)
Potassium: 3.6 mmol/L (ref 3.5–5.1)
Sodium: 138 mmol/L (ref 135–145)

## 2015-03-13 LAB — GLUCOSE, CAPILLARY: Glucose-Capillary: 156 mg/dL — ABNORMAL HIGH (ref 65–99)

## 2015-03-13 MED ORDER — OMEPRAZOLE 20 MG PO CPDR: 40.0000 mg | DELAYED_RELEASE_CAPSULE | Freq: Two times a day (BID) | ORAL | Status: DC

## 2015-03-13 MED ORDER — METOPROLOL TARTRATE 25 MG PO TABS
50.0000 mg | ORAL_TABLET | Freq: Two times a day (BID) | ORAL | Status: DC
Start: 2015-03-13 — End: 2015-04-19

## 2015-03-13 MED ORDER — POLYETHYLENE GLYCOL 3350 17 G PO PACK: 17.0000 g | PACK | Freq: Every day | ORAL | Status: DC

## 2015-03-13 MED ORDER — POTASSIUM CHLORIDE ER 20 MEQ PO TBCR: 10.0000 meq | EXTENDED_RELEASE_TABLET | Freq: Every day | ORAL | Status: DC

## 2015-03-13 MED ORDER — FLUCONAZOLE 100 MG PO TABS: 100.0000 mg | ORAL_TABLET | Freq: Every day | ORAL | Status: AC

## 2015-03-13 MED ORDER — FUROSEMIDE 40 MG PO TABS: 40.0000 mg | ORAL_TABLET | Freq: Every day | ORAL | Status: DC

## 2015-03-13 NOTE — Discharge Instructions (Addendum)
Heart-Healthy Eating Plan Many factors influence your heart health, including eating and exercise habits. Heart (coronary) risk increases with abnormal blood fat (lipid) levels. Heart-healthy meal planning includes limiting unhealthy fats, increasing healthy fats, and making other small dietary changes. This includes maintaining a healthy body weight to help keep lipid levels within a normal range. WHAT IS MY PLAN?  Your health care provider recommends that you:  Get no more than _________% of the total calories in your daily diet from fat.  Limit your intake of saturated fat to less than _________% of your total calories each day.  Limit the amount of cholesterol in your diet to less than _________ mg per day. WHAT TYPES OF FAT SHOULD I CHOOSE?  Choose healthy fats more often. Choose monounsaturated and polyunsaturated fats, such as olive oil and canola oil, flaxseeds, walnuts, almonds, and seeds.  Eat more omega-3 fats. Good choices include salmon, mackerel, sardines, tuna, flaxseed oil, and ground flaxseeds. Aim to eat fish at least two times each week.  Limit saturated fats. Saturated fats are primarily found in animal products, such as meats, butter, and cream. Plant sources of saturated fats include palm oil, palm kernel oil, and coconut oil.  Avoid foods with partially hydrogenated oils in them. These contain trans fats. Examples of foods that contain trans fats are stick margarine, some tub margarines, cookies, crackers, and other baked goods. WHAT GENERAL GUIDELINES DO I NEED TO FOLLOW?  Check food labels carefully to identify foods with trans fats or high amounts of saturated fat.  Fill one half of your plate with vegetables and green salads. Eat 4-5 servings of vegetables per day. A serving of vegetables equals 1 cup of raw leafy vegetables,  cup of raw or cooked cut-up vegetables, or  cup of vegetable juice.  Fill one fourth of your plate with whole grains. Look for the word  "whole" as the first word in the ingredient list.  Fill one fourth of your plate with lean protein foods.  Eat 4-5 servings of fruit per day. A serving of fruit equals one medium whole fruit,  cup of dried fruit,  cup of fresh, frozen, or canned fruit, or  cup of 100% fruit juice.  Eat more foods that contain soluble fiber. Examples of foods that contain this type of fiber are apples, broccoli, carrots, beans, peas, and barley. Aim to get 20-30 g of fiber per day.  Eat more home-cooked food and less restaurant, buffet, and fast food.  Limit or avoid alcohol.  Limit foods that are high in starch and sugar.  Avoid fried foods.  Cook foods by using methods other than frying. Baking, boiling, grilling, and broiling are all great options. Other fat-reducing suggestions include:  Removing the skin from poultry.  Removing all visible fats from meats.  Skimming the fat off of stews, soups, and gravies before serving them.  Steaming vegetables in water or broth.  Lose weight if you are overweight. Losing just 5-10% of your initial body weight can help your overall health and prevent diseases such as diabetes and heart disease.  Increase your consumption of nuts, legumes, and seeds to 4-5 servings per week. One serving of dried beans or legumes equals  cup after being cooked, one serving of nuts equals 1 ounces, and one serving of seeds equals  ounce or 1 tablespoon.  You may need to monitor your salt (sodium) intake, especially if you have high blood pressure. Talk with your health care provider or dietitian to get  more information about reducing sodium. °WHAT FOODS CAN I EAT? °Grains °Breads, including French, white, pita, wheat, raisin, rye, oatmeal, and Italian. Tortillas that are neither fried nor made with lard or trans fat. Low-fat rolls, including hotdog and hamburger buns and English muffins. Biscuits. Muffins. Waffles. Pancakes. Light popcorn. Whole-grain cereals. Flatbread. Melba  toast. Pretzels. Breadsticks. Rusks. Low-fat snacks and crackers, including oyster, saltine, matzo, graham, animal, and rye. Rice and pasta, including brown rice and those that are made with whole wheat. °Vegetables °All vegetables. °Fruits °All fruits, but limit coconut. °Meats and Other Protein Sources °Lean, well-trimmed beef, veal, pork, and lamb. Chicken and turkey without skin. All fish and shellfish. Wild duck, rabbit, pheasant, and venison. Egg whites or low-cholesterol egg substitutes. Dried beans, peas, lentils, and tofu. Seeds and most nuts. °Dairy °Low-fat or nonfat cheeses, including ricotta, string, and mozzarella. Skim or 1% milk that is liquid, powdered, or evaporated. Buttermilk that is made with low-fat milk. Nonfat or low-fat yogurt. °Beverages °Mineral water. Diet carbonated beverages. °Sweets and Desserts °Sherbets and fruit ices. Honey, jam, marmalade, jelly, and syrups. Meringues and gelatins. Pure sugar candy, such as hard candy, jelly beans, gumdrops, mints, marshmallows, and small amounts of dark chocolate. Angel food cake. °Eat all sweets and desserts in moderation. °Fats and Oils °Nonhydrogenated (trans-free) margarines. Vegetable oils, including soybean, sesame, sunflower, olive, peanut, safflower, corn, canola, and cottonseed. Salad dressings or mayonnaise that are made with a vegetable oil. Limit added fats and oils that you use for cooking, baking, salads, and as spreads. °Other °Cocoa powder. Coffee and tea. All seasonings and condiments. °The items listed above may not be a complete list of recommended foods or beverages. Contact your dietitian for more options. °WHAT FOODS ARE NOT RECOMMENDED? °Grains °Breads that are made with saturated or trans fats, oils, or whole milk. Croissants. Butter rolls. Cheese breads. Sweet rolls. Donuts. Buttered popcorn. Chow mein noodles. High-fat crackers, such as cheese or butter crackers. °Meats and Other Protein Sources °Fatty meats, such as  hotdogs, short ribs, sausage, spareribs, bacon, ribeye roast or steak, and mutton. High-fat deli meats, such as salami and bologna. Caviar. Domestic duck and goose. Organ meats, such as kidney, liver, sweetbreads, brains, gizzard, chitterlings, and heart. °Dairy °Cream, sour cream, cream cheese, and creamed cottage cheese. Whole milk cheeses, including blue (bleu), Monterey Jack, Brie, [place], American, Havarti, Swiss, cheddar, Camembert, and [place].  Whole or 2% milk that is liquid, evaporated, or condensed. Whole buttermilk. Cream sauce or high-fat cheese sauce. Yogurt that is made from whole milk. °Beverages °Regular sodas and drinks with added sugar. °Sweets and Desserts °Frosting. Pudding. Cookies. Cakes other than angel food cake. Candy that has milk chocolate or white chocolate, hydrogenated fat, butter, coconut, or unknown ingredients. Buttered syrups. Full-fat ice cream or ice cream drinks. °Fats and Oils °Gravy that has suet, meat fat, or shortening. Cocoa butter, hydrogenated oils, palm oil, coconut oil, palm kernel oil. These can often be found in baked products, candy, fried foods, nondairy creamers, and whipped toppings. Solid fats and shortenings, including bacon fat, salt pork, lard, and butter. Nondairy cream substitutes, such as coffee creamers and sour cream substitutes. Salad dressings that are made of unknown oils, cheese, or sour cream. °The items listed above may not be a complete list of foods and beverages to avoid. Contact your dietitian for more information. °  °This information is not intended to replace advice given to you by your health care provider. Make sure you discuss any questions you have with your health   care provider.   Document Released: 11/09/2007 Document Revised: 02/20/2014 Document Reviewed: 07/24/2013 Elsevier Interactive Patient Education 2016 [employer]    Heart Failure Heart failure is a condition in which the heart has trouble pumping blood. This means  your heart does not pump blood efficiently for your body to work well. In some cases of heart failure, fluid may back up into your lungs or you may have swelling (edema) in your lower legs. Heart failure is usually a long-term (chronic) condition. It is important for you to take good care of yourself and follow your health care provider's treatment plan. CAUSES  Some health conditions can cause heart failure. Those health conditions include:  High blood pressure (hypertension). Hypertension causes the heart muscle to work harder than normal. When pressure in the blood vessels is high, the heart needs to pump (contract) with more force in order to circulate blood throughout the body. High blood pressure eventually causes the heart to become stiff and weak.  Coronary artery disease (CAD). CAD is the buildup of cholesterol and fat (plaque) in the arteries of the heart. The blockage in the arteries deprives the heart muscle of oxygen and blood. This can cause chest pain and may lead to a heart attack. High blood pressure can also contribute to CAD.  Heart attack (myocardial infarction). A heart attack occurs when one or more arteries in the heart become blocked. The loss of oxygen damages the muscle tissue of the heart. When this happens, part of the heart muscle dies. The injured tissue does not contract as well and weakens the heart's ability to pump blood.  Abnormal heart valves. When the heart valves do not open and close properly, it can cause heart failure. This makes the heart muscle pump harder to keep the blood flowing.  Heart muscle disease (cardiomyopathy or myocarditis). Heart muscle disease is damage to the heart muscle from a variety of causes. These can include drug or alcohol abuse, infections, or unknown reasons. These can increase the risk of heart failure.  Lung disease. Lung disease makes the heart work harder because the lungs do not work properly. This can cause a strain on the heart,  leading it to fail.  Diabetes. Diabetes increases the risk of heart failure. High blood sugar contributes to high fat (lipid) levels in the blood. Diabetes can also cause slow damage to tiny blood vessels that carry important nutrients to the heart muscle. When the heart does not get enough oxygen and food, it can cause the heart to become weak and stiff. This leads to a heart that does not contract efficiently.  Other conditions can contribute to heart failure. These include abnormal heart rhythms, thyroid problems, and low blood counts (anemia). Certain unhealthy behaviors can increase the risk of heart failure, including:  Being overweight.  Smoking or chewing tobacco.  Eating foods high in fat and cholesterol.  Abusing illicit drugs or alcohol.  Lacking physical activity. SYMPTOMS  Heart failure symptoms may vary and can be hard to detect. Symptoms may include:  Shortness of breath with activity, such as climbing stairs.  Persistent cough.  Swelling of the feet, ankles, legs, or abdomen.  Unexplained weight gain.  Difficulty breathing when lying flat (orthopnea).  Waking from sleep because of the need to sit up and get more air.  Rapid heartbeat.  Fatigue and loss of energy.  Feeling light-headed, dizzy, or close to fainting.  Loss of appetite.  Nausea.  Increased urination during the night (nocturia). DIAGNOSIS  A diagnosis of heart failure is based on your history, symptoms, physical examination, and diagnostic tests. Diagnostic tests for heart failure may include:  Echocardiography.  Electrocardiography.  Chest X-ray.  Blood tests.  Exercise stress test.  Cardiac angiography.  Radionuclide scans. TREATMENT  Treatment is aimed at managing the symptoms of heart failure. Medicines, behavioral changes, or surgical intervention may be necessary to treat heart failure.  Medicines to help treat heart failure may include:  Angiotensin-converting enzyme  (ACE) inhibitors. This type of medicine blocks the effects of a blood protein called angiotensin-converting enzyme. ACE inhibitors relax (dilate) the blood vessels and help lower blood pressure.  Angiotensin receptor blockers (ARBs). This type of medicine blocks the actions of a blood protein called angiotensin. Angiotensin receptor blockers dilate the blood vessels and help lower blood pressure.  Water pills (diuretics). Diuretics cause the kidneys to remove salt and water from the blood. The extra fluid is removed through urination. This loss of extra fluid lowers the volume of blood the heart pumps.  Beta blockers. These prevent the heart from beating too fast and improve heart muscle strength.  Digitalis. This increases the force of the heartbeat.  Healthy behavior changes include:  Obtaining and maintaining a healthy weight.  Stopping smoking or chewing tobacco.  Eating heart-healthy foods.  Limiting or avoiding alcohol.  Stopping illicit drug use.  Physical activity as directed by your health care provider.  Surgical treatment for heart failure may include:  A procedure to open blocked arteries, repair damaged heart valves, or remove damaged heart muscle tissue.  A pacemaker to improve heart muscle function and control certain abnormal heart rhythms.  An internal cardioverter defibrillator to treat certain serious abnormal heart rhythms.  A left ventricular assist device (LVAD) to assist the pumping ability of the heart. HOME CARE INSTRUCTIONS   Take medicines only as directed by your health care provider. Medicines are important in reducing the workload of your heart, slowing the progression of heart failure, and improving your symptoms.  Do not stop taking your medicine unless directed by your health care provider.  Do not skip any dose of medicine.  Refill your prescriptions before you run out of medicine. Your medicines are needed every day.  Engage in moderate  physical activity if directed by your health care provider. Moderate physical activity can benefit some people. The elderly and people with severe heart failure should consult with a health care provider for physical activity recommendations.  Eat heart-healthy foods. Food choices should be free of trans fat and low in saturated fat, cholesterol, and salt (sodium). Healthy choices include fresh or frozen fruits and vegetables, fish, lean meats, legumes, fat-free or low-fat dairy products, and whole grain or high fiber foods. Talk to a dietitian to learn more about heart-healthy foods.  Limit sodium if directed by your health care provider. Sodium restriction may reduce symptoms of heart failure in some people. Talk to a dietitian to learn more about heart-healthy seasonings.  Use healthy cooking methods. Healthy cooking methods include roasting, grilling, broiling, baking, poaching, steaming, or stir-frying. Talk to a dietitian to learn more about healthy cooking methods.  Limit fluids if directed by your health care provider. Fluid restriction may reduce symptoms of heart failure in some people.  Weigh yourself every day. Daily weights are important in the early recognition of excess fluid. You should weigh yourself every morning after you urinate and before you eat breakfast. Wear the same amount of clothing each time you weigh yourself. Record your  daily weight. Provide your health care provider with your weight record.  Monitor and record your blood pressure if directed by your health care provider.  Check your pulse if directed by your health care provider.  Lose weight if directed by your health care provider. Weight loss may reduce symptoms of heart failure in some people.  Stop smoking or chewing tobacco. Nicotine makes your heart work harder by causing your blood vessels to constrict. Do not use nicotine gum or patches before talking to your health care provider.  Keep all follow-up  visits as directed by your health care provider. This is important.  Limit alcohol intake to no more than 1 drink per day for nonpregnant women and 2 drinks per day for men. One drink equals 12 ounces of beer, 5 ounces of wine, or 1 ounces of hard liquor. Drinking more than that is harmful to your heart. Tell your health care provider if you drink alcohol several times a week. Talk with your health care provider about whether alcohol is safe for you. If your heart has already been damaged by alcohol or you have severe heart failure, drinking alcohol should be stopped completely.  Stop illicit drug use.  Stay up-to-date with immunizations. It is especially important to prevent respiratory infections through current pneumococcal and influenza immunizations.  Manage other health conditions such as hypertension, diabetes, thyroid disease, or abnormal heart rhythms as directed by your health care provider.  Learn to manage stress.  Plan rest periods when fatigued.  Learn strategies to manage high temperatures. If the weather is extremely hot:  Avoid vigorous physical activity.  Use air conditioning or fans or seek a cooler location.  Avoid caffeine and alcohol.  Wear loose-fitting, lightweight, and light-colored clothing.  Learn strategies to manage cold temperatures. If the weather is extremely cold:  Avoid vigorous physical activity.  Layer clothes.  Wear mittens or gloves, a hat, and a scarf when going outside.  Avoid alcohol.  Obtain ongoing education and support as needed.  Participate in or seek rehabilitation as needed to maintain or improve independence and quality of life. SEEK MEDICAL CARE IF:   You have a rapid weight gain.  You have increasing shortness of breath that is unusual for you.  You are unable to participate in your usual physical activities.  You tire easily.  You cough more than normal, especially with physical activity.  You have any or more  swelling in areas such as your hands, feet, ankles, or abdomen.  You are unable to sleep because it is hard to breathe.  You feel like your heart is beating fast (palpitations).  You become dizzy or light-headed upon standing up. SEEK IMMEDIATE MEDICAL CARE IF:   You have difficulty breathing.  There is a change in mental status such as decreased alertness or difficulty with concentration.  You have a pain or discomfort in your chest.  You have an episode of fainting (syncope). MAKE SURE YOU:   Understand these instructions.  Will watch your condition.  Will get help right away if you are not doing well or get worse.   This information is not intended to replace advice given to you by your health care provider. Make sure you discuss any questions you have with your health care provider.   Document Released: 01/30/2005 Document Revised: 06/16/2014 Document Reviewed: 03/01/2012 [employer] Interactive Patient Education [employer].

## 2015-03-13 NOTE — Discharge Summary (Addendum)
Physician Discharge Summary  JASKARAN LINEBERGER [id number] DOB: 07-06-1945 DOA: 03/06/2015  PCP: [name], MD  Admit date: 03/06/2015 Discharge date: 03/13/2015  Time spent: 35 minutes  Recommendations for Outpatient Follow-up:  1. Discharge home with home health RN and PT. Follow-up with PCP in 1 week. Please monitor renal function and potassium during outpatient visit. 2. Discharge on daily Lasix. Heart failure instructions provided including meal planning, daily weight monitoring and medication adherence, exercise to lose weight. 3. Follow-up with GI Dr. Henrene Pastor. Appointment scheduled. 4. Patient will complete a seven-day course of Diflucan on 1/31.   Discharge Diagnoses:  Principal Problem:   Acute diastolic CHF (congestive heart failure) (HCC)   Active Problems:   Esophageal candidiasis (HCC)   Left lobar pneumonia   Essential hypertension, benign   Severe obesity (BMI >= 40) (HCC)   Intractable nausea and vomiting   Abdominal pain   Uncontrolled diabetes mellitus type 2 without complications ([place])   Chest pain on breathing   Hypoxemia   Sinus tachycardia ([place])   Hypothyroidism      Discharge Condition: Fair  Discharge weight: 302 pounds  Diet recommendation: Heart healthy/diabetic    Filed Weights   03/11/15 0528 03/12/15 0424 03/13/15 0651  Weight: 141.114 kg (311 lb 1.6 oz) 139.254 kg (307 lb) 137.122 kg (302 lb 4.8 oz)    History of present illness:  Reason for to admission H&P for details, in brief,70 year old morbidly obese male with history of type 2 diabetes mellitus, hypertension, hypothyroidism who was previously evaluated by cardiology for dyspnea on exertion thought to be secondary to underlying obesity and physical deconditioning and recommended for risk factor modifications, history of dysphagia with EGD done on 06/2014 with esophageal dilatation (for possible ring) presented to the ED with severe sharp epigastric pain worsened with breathing and  without any relieving factors. He had associated nausea and vomiting. He has been chronically on NSAIDs using meloxicam. Patient seen by GI and underwent EGD showing Candida esophagitis with segmental abnormality of the duodenal wall. Also found to have a left lobar pneumonia and wheezing.  Hospital Course:  Acute diastolic CHF Patient had volume overload while in the hospital with almost 10 pound weight gain since admission. He is on oral Lasix at home as needed. He was found to be short of breath and actively wheezing on 1/26 placed on IV Lasix 60 twice a day. Much improved with good diuresis. His weight is down to 302 pounds (normal weight around 310 pounds) . 2-D echo showing EF of 65-70% with normal wall motion. -Patient will be discharged on oral Lasix 40 mg daily. Provided with home health heart failure instructions.  Epigastric pain with intractable nausea and vomiting with? Ileus CT abdomen from 1/24 showed distal gastric and proximal duodenal wall thickening with surrounding inflammation. Placed on IV PPI. Seen by GI and underwent EGD on 1/25 showing Candida esophagitis with segmental abnormality of the duodenal wall. Biopsy was mild duodenitis with erosion. -Treating candidiasis with fluconazole for 7 day course. -Symptoms much better (abdominal pain, distention, nausea and vomiting have resolved). On PPI twice a day which he will continue. (Patient was on omeprazole 20 mg daily and instructed him to take 40 mg twice a day prescription provided)  Patient is to follow-up with Dr. Henrene Pastor (appointment scheduled for 2/13 at 10 AM)  Acute kidney injury Possibly prerenal and secondary to contrast. Subsequently treated with IV Lasix for volume overload. Creatinine 1.32 today. He will be discharged on oral Lasix 40 mg  daily. Aldactone discontinued. Please follow renal function during outpatient visit.  Lobar pneumonia Patient had  productive cough with chest x-ray showing possible left base  infiltrate.  Treated with a course of Levaquin. Symptoms resolved.  Essential hypertension Stable. Increased dose of metoprolol with good heart rate control.   type 2 diabetes mellitus with hyperglycemia . FSG stable. A1c of 7.1. Resume metformin.  Morbid obesity counsled on diet and exercise to lose weight.  Sinus tachycardia Possibly with dyspnea, morbid obesity and deconditioning. TSH normal. CT angiogram of the chest done on admission was negative for PE. His dose of metoprolol with good heart rate control.   Patient seen by physical therapy and recommended home health PT with supervision. Arranged home health RN and PT. Stable for discharge home.   Code Status: Full code Family Communication: Wife  at bedside Disposition Plan: Home with home health   Consultants:  Cardiology  [facility]  Procedures:  CT abdomen and pelvis  2-D echo  EGD  Antibiotics:  Levaquin  Discharge Exam: Filed Vitals:   03/12/15 2129 03/13/15 0651  BP: 139/71 133/66  Pulse: 117 99  Temp: 98.6 F (37 C) 98.2 F (36.8 C)  Resp: 16 18     General: Elderly obese male not in distress  HEENT: Moist mucosa  Chest: There are bilateral breath sounds, no added sounds  Cardiovascular: S1 and S2 normal, no murmurs rub or gallop  GI: Soft, abdominal distention and tenderness resolved. , bowel sounds present  Musculoskeletal: Warm, 1+ pitting edema bilaterally (chronic as per wife)  CNS: Alert and oriented  Discharge Instructions    Current Discharge Medication List    START taking these medications   Details  fluconazole (DIFLUCAN) 100 MG tablet Take 1 tablet (100 mg total) by mouth daily. Qty: 4 tablet, Refills: 0    polyethylene glycol (MIRALAX / GLYCOLAX) packet Take 17 g by mouth daily. Qty: 30 each, Refills: 0    potassium chloride 20 MEQ TBCR Take 10 mEq by mouth daily. Qty: 30 tablet, Refills: 0      CONTINUE these medications which have CHANGED   Details   furosemide (LASIX) 40 MG tablet Take 1 tablet (40 mg total) by mouth daily. Qty: 90 tablet, Refills: 3    metoprolol tartrate (LOPRESSOR) 25 MG tablet Take 2 tablets (50 mg total) by mouth 2 (two) times daily. Qty: 60 tablet, Refills: 0    omeprazole (PRILOSEC) 20 MG capsule Take 2 capsules (40 mg total) by mouth 2 (two) times daily before a meal. Qty: 120 capsule, Refills: 0      CONTINUE these medications which have NOT CHANGED   Details  allopurinol (ZYLOPRIM) 300 MG tablet Take 150-300 mg by mouth 2 (two) times daily. 1 tablet (300 mg) in the morning and 0.5 tablets (150 mg) at night    aspirin EC 81 MG tablet Take 81 mg by mouth daily.    Calcium Carbonate (CALCIUM 600) 1500 MG TABS Take 1,500 mg by mouth daily as needed (nutrition).     cetirizine (ZYRTEC) 10 MG tablet Take 10 mg by mouth daily.    Cholecalciferol (VITAMIN D PO) Take 1 tablet by mouth daily.    Coenzyme Q10 (CO Q 10 PO) Take 1 capsule by mouth daily.     doxazosin (CARDURA) 4 MG tablet Take 1 tablet (4 mg total) by mouth at bedtime. Qty: 90 tablet, Refills: 1    levothyroxine (SYNTHROID, LEVOTHROID) 50 MCG tablet Take 50 mcg by mouth daily before  breakfast.    metFORMIN (GLUCOPHAGE) 500 MG tablet Take 1 tablet (500 mg total) by mouth daily with breakfast. 1 by mouth daily with breakfast, LABS DUE Qty: 30 tablet, Refills: 0    Multiple Vitamin (MULTIVITAMIN WITH MINERALS) TABS tablet Take 1 tablet by mouth daily.    Multiple Vitamins-Minerals (MULTIVITAMIN PO) Take 1 tablet by mouth daily. Joint Pain.    Omega-3 Fatty Acids (FISH OIL) 1000 MG CAPS Take 1 capsule by mouth daily.     Red Yeast Rice Extract (RED YEAST RICE PO) Take 1 capsule by mouth at bedtime.     Saw Palmetto, Serenoa repens, (SAW PALMETTO PO) Take 1 capsule by mouth daily.      STOP taking these medications     meloxicam (MOBIC) 7.5 MG tablet      spironolactone (ALDACTONE) 25 MG tablet        Allergies  Allergen Reactions   . Bupropion Hcl     REACTION: insomnia  . Codeine     REACTION: nausea  . Escitalopram Oxalate     REACTION: insomnia  . Sertraline Hcl     REACTION: insomnia  . Sulfa Antibiotics     Pt can not remember  . Sulfur Other (See Comments)   Follow-up Information    Follow up with Scarlette Shorts, MD On 03/29/2015.   Specialty:  Gastroenterology   Why:  10:00 am   Contact information:   [address] [place] [id number] [phone]       Follow up with Crist Infante A, MD. Schedule an appointment as soon as possible for a visit in 1 week.   Specialty:  Internal Medicine   Contact information:   [place] [address] [phone]        The results of significant diagnostics from this hospitalization (including imaging, microbiology, ancillary and laboratory) are listed below for reference.    Significant Diagnostic Studies: Ct Abdomen Pelvis Wo Contrast  03/06/2015  CLINICAL DATA:  70 year old male with abdominal pain since 0300 hours. Pain also in the center of the chest. Initial encounter. EXAM: CT ABDOMEN AND PELVIS WITHOUT CONTRAST TECHNIQUE: Multidetector CT imaging of the abdomen and pelvis was performed following the standard protocol without IV contrast. COMPARISON:  Chest CTA 1012 hours today. Abdomen ultrasound 1101 hours today. FINDINGS: Stable visualized lung bases from earlier today, mild atelectasis and/or scarring. No pericardial or pleural effusion. Calcified thoracic aortic and coronary artery atherosclerosis. Advanced lumbar spine degeneration. No acute osseous abnormality identified. No pelvic free fluid. Excreted IV contrast in the renal collecting systems, ureters, and urinary bladder. Negative rectum. Redundant but otherwise negative sigmoid colon. Negative left colon, transverse colon, right colon and appendix. Negative terminal ileum. No dilated small bowel. Small volume of oral contrast in the small bowel. Decompressed stomach and duodenum. No  abdominal free air or free fluid. Decreased density throughout the liver in keeping with hepatic steatosis. Noncontrast gallbladder, spleen, pancreas, and adrenal glands are within normal limits. Aortoiliac calcified atherosclerosis noted. No lymphadenopathy. No mesenteric stranding identified. IMPRESSION: 1. No acute or inflammatory process identified in the abdomen or pelvis. 2. Hepatic steatosis. 3. Calcified aortic and coronary artery atherosclerosis. 4. Excreted IV contrast in normal appearing renal collecting systems and urinary bladder. Electronically Signed   By: Genevie Ann M.D.   On: 03/06/2015 13:23   Dg Chest 2 View  03/06/2015  CLINICAL DATA:  Low chest pain EXAM: CHEST - 2 VIEW COMPARISON:  06/09/2008 FINDINGS: The heart size and mediastinal  contours are within normal limits. Both lungs are clear. The visualized skeletal structures are unremarkable. IMPRESSION: No active disease. Electronically Signed   By: Inez Catalina M.D.   On: 03/06/2015 09:41   Ct Angio Chest Pe W/cm &/or Wo Cm  03/06/2015  CLINICAL DATA:  70 year old male with central chest pain radiating outward. Additional complaints include indigestion and pain on deep inspiration. EXAM: CT ANGIOGRAPHY CHEST WITH CONTRAST TECHNIQUE: Multidetector CT imaging of the chest was performed using the standard protocol during bolus administration of intravenous contrast. Multiplanar CT image reconstructions and MIPs were obtained to evaluate the vascular anatomy. CONTRAST:  175mL OMNIPAQUE IOHEXOL 350 MG/ML SOLN COMPARISON:  Prior CTA chest 06/09/2008 FINDINGS: Mediastinum: Unremarkable CT appearance of the thyroid gland. No suspicious mediastinal or hilar adenopathy. No soft tissue mediastinal mass. The thoracic esophagus is unremarkable. Heart/Vascular: Relatively poor opacification of the pulmonary arteries beyond the segmental level. There is no evidence of focal filling defect to suggest acute pulmonary embolus. The heart is normal in size. No  pericardial effusion. Calcifications present along the coronary arteries, including the left main and left anterior descending. Normal caliber thoracic aorta. Lungs/Pleura: Minimal atelectasis in the lower lobes and along the inferior aspect of the upper lobe along the major fissure. Tiny subpleural nodule within the minor fissure, unchanged dating back to 2010. No focal airspace consolidation, pulmonary edema, pleural effusion or pneumothorax. Bones/Soft Tissues: No acute fracture or aggressive appearing lytic or blastic osseous lesion. Upper Abdomen: Low attenuation of the hepatic parenchyma with relative sparing around the gallbladder fossa consistent with hepatic steatosis. Otherwise, the visualized upper abdomen is unremarkable. Review of the MIP images confirms the above findings. IMPRESSION: 1. Negative for acute pulmonary embolus, pneumonia or other acute pulmonary process. 2. Mild dependent atelectasis versus scarring which is very similar in appearance compared to 06/09/2008. 3. Atherosclerosis including coronary artery calcifications. 4. Hepatic steatosis. Electronically Signed   By: Jacqulynn Cadet M.D.   On: 03/06/2015 10:39   Ct Abdomen Pelvis W Contrast  03/09/2015  CLINICAL DATA:  Diffuse abdominal pain since 03/06/2015 with nausea and vomiting EXAM: CT ABDOMEN AND PELVIS WITH CONTRAST TECHNIQUE: Multidetector CT imaging of the abdomen and pelvis was performed using the standard protocol following bolus administration of intravenous contrast. CONTRAST:  18mL OMNIPAQUE IOHEXOL 300 MG/ML  SOLN COMPARISON:  03/06/2015 FINDINGS: Lower chest: Consolidation with air bronchograms in both lung bases new from prior study likely atelectasis. Tiny pleural effusions also new however. Hepatobiliary: Severe fatty infiltration of the liver. Pancreas: Normal Spleen: Normal Adrenals/Urinary Tract: Adrenal glands are normal. Stable symmetric likely chronic bilateral perinephric inflammation. Bladder negative.  Stomach/Bowel: There is wall thickening in the antrum of the stomach also involving the duodenum bulb and proximal descending duodenum. There is inflammatory change surrounding the proximal duodenum with fluid and inflammation extending into the subhepatic space on the right.There is a 57mm focus of air and contrast on image 44 in the region of the pancreatic head and adjacent descending duodenum; this was present on the previous (noncontrasted) study and appeared as a 6mm focus of air. Vascular/Lymphatic: Aortic atherosclerosis with calcification and no dilatation. Reproductive: Negative Musculoskeletal: No acute findings IMPRESSION: New from the prior study is distal gastric and proximal duodenal wall thickening with surrounding inflammation and small volume free fluid extending into the subhepatic space. No findings to definitively indicate perforation; a 15mm adjacent collection of air/contrast along the medial border of the descending duodenum could potentially indicate a tiny ulceration, although it occurs in a region  in which the wall is already returning to more normal caliber and may simply represent a tiny duodenal diverticulum. In either case, the findings are suspicious for gastritis and duodenitis and may indicate peptic ulcer disease. Electronically Signed   By: Skipper Cliche M.D.   On: 03/09/2015 20:04   Dg Chest Port 1 View  03/08/2015  CLINICAL DATA:  Acute hypoxemia. EXAM: PORTABLE CHEST 1 VIEW COMPARISON:  Two-view chest x-ray and CTA chest 03/06/2015. FINDINGS: Markedly suboptimal inspiration which accounts for atelectasis in the lung bases, progressive since the examinations 2 days ago. Cardiac silhouette upper normal in size for technique and degree of inspiration. Normal pulmonary vascularity. No visible pleural effusions. IMPRESSION: Markedly suboptimal inspiration accounts for bibasilar atelectasis, progressive since the examinations 2 days ago Electronically Signed   By: Evangeline Dakin  M.D.   On: 03/08/2015 12:28   Dg Abd Acute W/chest  03/10/2015  CLINICAL DATA:  Diabetes, ileus EXAM: DG ABDOMEN ACUTE W/ 1V CHEST COMPARISON:  03/09/2015 and 03/08/2015 FINDINGS: There is small right pleural effusion with right basilar atelectasis or infiltrate. Patchy atelectasis or infiltrate is noted left base. No pulmonary edema. Moderate gas noted within stomach. Moderate gas noted within transverse colon. Residual contrast noted in distal small bowel and proximal right colon. No small bowel air-fluid levels. Contrast material/ urine noted within urinary bladder. No free abdominal air. IMPRESSION: Small right pleural effusion with right basilar atelectasis or infiltrate. Patchy atelectasis or infiltrate left base. Moderate gas noted within stomach and transverse colon. Some residual contrast noted in distal small bowel. Contrast material/ urine noted within urinary bladder. No free abdominal air. Electronically Signed   By: Lahoma Crocker M.D.   On: 03/10/2015 08:17   Dg Abd Portable 1v  03/08/2015  CLINICAL DATA:  Abdominal pain across upper abdomen. EXAM: PORTABLE ABDOMEN - 1 VIEW COMPARISON:  CT scan from 03/06/2015. FINDINGS: Supine view the abdomen shows gaseous distension of colon and small bowel in the mid abdomen. Study is limited by portable technique. IMPRESSION: Mild gaseous distention of small bowel and colon. A component of ileus is not excluded. Electronically Signed   By: Misty Stanley M.D.   On: 03/08/2015 11:52   US Abdomen Limited Ruq  03/06/2015  CLINICAL DATA:  Right upper quadrant pain for several hours EXAM: US ABDOMEN LIMITED - RIGHT UPPER QUADRANT COMPARISON:  None. FINDINGS: Gallbladder: No gallstones or wall thickening visualized. No sonographic Murphy sign noted by sonographer. Common bile duct: Diameter: 4 mm Liver: Increase in echogenicity consistent with fatty infiltration. IMPRESSION: Fatty liver. No other focal abnormality is noted. Electronically Signed   By: Inez Catalina  M.D.   On: 03/06/2015 11:33    Microbiology: No results found for this or any previous visit (from the past 240 hour(s)).   Labs: Basic Metabolic Panel:  Recent Labs Lab 03/08/15 1927 03/09/15 0523 03/10/15 0520 03/11/15 0630 03/13/15 0540  NA 132* 135 133* 134* 138  K 5.1 4.6 4.1 4.2 3.6  CL 96* 98* 96* 94* 91*  CO2 26 30 27 28  33*  GLUCOSE 156* 165* 141* 153* 171*  BUN 21* 19 19 21* 20  CREATININE 1.17 1.10 1.03 1.12 1.32*  CALCIUM 8.9 9.0 9.1 8.8* 8.9  MG  --  1.9  --   --   --    Liver Function Tests:  Recent Labs Lab 03/07/15 0422 03/10/15 1115 03/11/15 0630  AST 39 33 53*  ALT 37 28 31  ALKPHOS 62 81 93  BILITOT 1.2  1.3* 2.3*  PROT 7.1 6.5 6.1*  ALBUMIN 3.7 2.7* 2.5*    Recent Labs Lab 03/06/15 1222 03/11/15 0630  LIPASE 33 38   No results for input(s): AMMONIA in the last 168 hours. CBC:  Recent Labs Lab 03/07/15 0422 03/08/15 0542 03/09/15 0523 03/10/15 1115 03/11/15 0453  WBC 15.4* 18.0* 14.4* 16.1* 15.4*  HGB 13.9 13.6 12.4* 12.5* 12.2*  HCT 43.0 42.4 37.4* 36.2* 37.1*  MCV 97.1 97.0 94.9 93.5 93.9  PLT 200 185 187 191 230   Cardiac Enzymes: No results for input(s): CKTOTAL, CKMB, CKMBINDEX, TROPONINI in the last 168 hours. BNP: BNP (last 3 results)  Recent Labs  03/10/15 1105  BNP 150.4*    ProBNP (last 3 results) No results for input(s): PROBNP in the last 8760 hours.  CBG:  Recent Labs Lab 03/12/15 0754 03/12/15 1159 03/12/15 1702 03/12/15 2135 03/13/15 0753  GLUCAP 153* 220* 173* 169* 156*       Signed:  Louellen Molder MD.  [facility] 03/13/2015, 8:52 AM

## 2015-03-13 NOTE — Care Management Note (Addendum)
Case Management Note  Patient Details  Name: Terry Lyons MRN: [id number] Date of Birth: [birth year]  Subjective/Objective:         Candida esophagitis, PNA, CHF           Action/Plan: NCM spoke to pt and wife, Terry Lyons at bedside. Offered choice for [facility]. Provided [facility] list. Wife agreeable to [facility] for [facility]. Pt states he would benefit from [facility]. States he had knee surgery in the past. [facility] contacted for DME and HH for home.    Expected Discharge Date:  03/13/2015            Expected Discharge Plan:  [place]  In-House Referral:  NA  Discharge planning Services  CM Consult  Post Acute Care Choice:  Home Health Choice offered to:  Spouse  DME Arranged:  [facility] rolling with seat DME Agency:  [place] Arranged:  RN, PT [facility] Agency:  [place]  Status of Service:  Completed, signed off  Medicare Important Message Given:  Yes Date Medicare IM Given:    Medicare IM give by:    Date Additional Medicare IM Given:    Additional Medicare Important Message give by:     If discussed at [place] of Stay Meetings, dates discussed:    Additional Comments:  Erenest Rasher, RN 03/13/2015, 10:03 AM

## 2015-03-14 DIAGNOSIS — I110 Hypertensive heart disease with heart failure: Secondary | ICD-10-CM | POA: Diagnosis not present

## 2015-03-14 DIAGNOSIS — Z7982 Long term (current) use of aspirin: Secondary | ICD-10-CM | POA: Diagnosis not present

## 2015-03-14 DIAGNOSIS — Z86012 Personal history of benign carcinoid tumor: Secondary | ICD-10-CM | POA: Diagnosis not present

## 2015-03-14 DIAGNOSIS — J189 Pneumonia, unspecified organism: Secondary | ICD-10-CM | POA: Diagnosis not present

## 2015-03-14 DIAGNOSIS — Z6841 Body Mass Index (BMI) 40.0 and over, adult: Secondary | ICD-10-CM | POA: Diagnosis not present

## 2015-03-14 DIAGNOSIS — E1165 Type 2 diabetes mellitus with hyperglycemia: Secondary | ICD-10-CM | POA: Diagnosis not present

## 2015-03-14 DIAGNOSIS — E039 Hypothyroidism, unspecified: Secondary | ICD-10-CM | POA: Diagnosis not present

## 2015-03-14 DIAGNOSIS — Z7984 Long term (current) use of oral hypoglycemic drugs: Secondary | ICD-10-CM | POA: Diagnosis not present

## 2015-03-14 DIAGNOSIS — I5031 Acute diastolic (congestive) heart failure: Secondary | ICD-10-CM | POA: Diagnosis not present

## 2015-03-14 DIAGNOSIS — K219 Gastro-esophageal reflux disease without esophagitis: Secondary | ICD-10-CM | POA: Diagnosis not present

## 2015-03-14 DIAGNOSIS — R269 Unspecified abnormalities of gait and mobility: Secondary | ICD-10-CM | POA: Diagnosis not present

## 2015-03-17 DIAGNOSIS — J189 Pneumonia, unspecified organism: Secondary | ICD-10-CM | POA: Diagnosis not present

## 2015-03-17 DIAGNOSIS — Z7982 Long term (current) use of aspirin: Secondary | ICD-10-CM | POA: Diagnosis not present

## 2015-03-17 DIAGNOSIS — I5031 Acute diastolic (congestive) heart failure: Secondary | ICD-10-CM | POA: Diagnosis not present

## 2015-03-17 DIAGNOSIS — Z7984 Long term (current) use of oral hypoglycemic drugs: Secondary | ICD-10-CM | POA: Diagnosis not present

## 2015-03-17 DIAGNOSIS — I110 Hypertensive heart disease with heart failure: Secondary | ICD-10-CM | POA: Diagnosis not present

## 2015-03-17 DIAGNOSIS — Z6841 Body Mass Index (BMI) 40.0 and over, adult: Secondary | ICD-10-CM | POA: Diagnosis not present

## 2015-03-17 DIAGNOSIS — E1165 Type 2 diabetes mellitus with hyperglycemia: Secondary | ICD-10-CM | POA: Diagnosis not present

## 2015-03-17 DIAGNOSIS — Z86012 Personal history of benign carcinoid tumor: Secondary | ICD-10-CM | POA: Diagnosis not present

## 2015-03-17 DIAGNOSIS — K219 Gastro-esophageal reflux disease without esophagitis: Secondary | ICD-10-CM | POA: Diagnosis not present

## 2015-03-17 DIAGNOSIS — E039 Hypothyroidism, unspecified: Secondary | ICD-10-CM | POA: Diagnosis not present

## 2015-03-23 DIAGNOSIS — E039 Hypothyroidism, unspecified: Secondary | ICD-10-CM | POA: Diagnosis not present

## 2015-03-23 DIAGNOSIS — J189 Pneumonia, unspecified organism: Secondary | ICD-10-CM | POA: Diagnosis not present

## 2015-03-23 DIAGNOSIS — I5031 Acute diastolic (congestive) heart failure: Secondary | ICD-10-CM | POA: Diagnosis not present

## 2015-03-23 DIAGNOSIS — Z86012 Personal history of benign carcinoid tumor: Secondary | ICD-10-CM | POA: Diagnosis not present

## 2015-03-23 DIAGNOSIS — I110 Hypertensive heart disease with heart failure: Secondary | ICD-10-CM | POA: Diagnosis not present

## 2015-03-23 DIAGNOSIS — K219 Gastro-esophageal reflux disease without esophagitis: Secondary | ICD-10-CM | POA: Diagnosis not present

## 2015-03-23 DIAGNOSIS — E1165 Type 2 diabetes mellitus with hyperglycemia: Secondary | ICD-10-CM | POA: Diagnosis not present

## 2015-03-23 DIAGNOSIS — Z6841 Body Mass Index (BMI) 40.0 and over, adult: Secondary | ICD-10-CM | POA: Diagnosis not present

## 2015-03-23 DIAGNOSIS — Z7982 Long term (current) use of aspirin: Secondary | ICD-10-CM | POA: Diagnosis not present

## 2015-03-23 DIAGNOSIS — Z7984 Long term (current) use of oral hypoglycemic drugs: Secondary | ICD-10-CM | POA: Diagnosis not present

## 2015-03-29 ENCOUNTER — Ambulatory Visit: Payer: PPO | Admitting: Internal Medicine

## 2015-04-11 DIAGNOSIS — J188 Other pneumonia, unspecified organism: Secondary | ICD-10-CM | POA: Diagnosis not present

## 2015-04-11 DIAGNOSIS — E119 Type 2 diabetes mellitus without complications: Secondary | ICD-10-CM | POA: Diagnosis not present

## 2015-04-11 DIAGNOSIS — E784 Other hyperlipidemia: Secondary | ICD-10-CM | POA: Diagnosis not present

## 2015-04-11 DIAGNOSIS — B3781 Candidal esophagitis: Secondary | ICD-10-CM | POA: Diagnosis not present

## 2015-04-11 DIAGNOSIS — Z6841 Body Mass Index (BMI) 40.0 and over, adult: Secondary | ICD-10-CM | POA: Diagnosis not present

## 2015-04-11 DIAGNOSIS — R972 Elevated prostate specific antigen [PSA]: Secondary | ICD-10-CM | POA: Diagnosis not present

## 2015-04-11 DIAGNOSIS — E038 Other specified hypothyroidism: Secondary | ICD-10-CM | POA: Diagnosis not present

## 2015-04-11 DIAGNOSIS — I5031 Acute diastolic (congestive) heart failure: Secondary | ICD-10-CM | POA: Diagnosis not present

## 2015-04-11 DIAGNOSIS — I10 Essential (primary) hypertension: Secondary | ICD-10-CM | POA: Diagnosis not present

## 2015-04-19 ENCOUNTER — Ambulatory Visit (INDEPENDENT_AMBULATORY_CARE_PROVIDER_SITE_OTHER): Payer: PPO | Admitting: Internal Medicine

## 2015-04-19 ENCOUNTER — Encounter: Payer: Self-pay | Admitting: Internal Medicine

## 2015-04-19 VITALS — BP 100/60 | HR 84 | Ht 67.0 in | Wt 290.0 lb

## 2015-04-19 DIAGNOSIS — K219 Gastro-esophageal reflux disease without esophagitis: Secondary | ICD-10-CM | POA: Diagnosis not present

## 2015-04-19 DIAGNOSIS — R1084 Generalized abdominal pain: Secondary | ICD-10-CM

## 2015-04-19 DIAGNOSIS — K3189 Other diseases of stomach and duodenum: Secondary | ICD-10-CM

## 2015-04-19 NOTE — Progress Notes (Signed)
HISTORY OF PRESENT ILLNESS:  Terry Lyons is a 70 y.o. male with a history of GERD, peptic stricture, and adenomatous colon polyps. I last saw the patient in the office 2010. He last underwent surveillance colonoscopy September 2015 with small adenoma removed. Follow-up in 5 years recommended. Recently hospitalized January 21 through January 28 for abdominal/chest pain. Evaluated by internal medicine, cardiology, and GI. I have reviewed multiple x-rays inpatient notes and endoscopy report. He is sent today for follow-up regarding abnormal endoscopic finding. The patient's discharge diagnoses were acute diastolic congestive heart failure responding to diuresis (diagnosis not endorsed by cardiology), possible left lobar pneumonia treated with antibiotics, and abdominal pain which resolved, etiology unknown. CT scan of the abdomen and pelvis 03/06/2015 was said to show distal gastric and proximal duodenal wall thickening with surrounding inflammation and small volume free fluid extending into the subhepatic space. Question duodenal diverticulum. Upper endoscopy 03/10/2015 revealed white exudates consistent with Candida esophagitis. In addition segmental abnormality of the duodenal wall from the distal bulb and proximal second portion described as a 2 x 4 cm submucosal lesion. Overlying biopsies normal. Follow-up with me at this time arranged. Patient is accompanied today by his wife. They have innumerable questions. He has had significant welcomed weight loss since discharge. No further problems with recurrent abdominal pain. Continues on PPI for GERD.  REVIEW OF SYSTEMS:  All non-GI ROS negative upon review  Past Medical History  Diagnosis Date  . GERD (gastroesophageal reflux disease)   . Gout   . History of basal cell carcinoma     W-S Derm  . History of squamous cell carcinoma   . Hyperlipidemia   . Tachycardia   . Hypertension   . Allergy   . Rosacea   . Diabetes mellitus   . Arthritis   .  Obesity   . Colon polyps     adenomatous    Past Surgical History  Procedure Laterality Date  . Tonsillectomy    . Pilonidal cyst excision    . Knee arthroscopy      R  . Colonoscopy with polypectomy      X3  . Esophagus dilated      X3  . Shoulder surgery      right shoulder  . Total knee arthroplasty      RIGHT  . Knee arthroscopy      bilaterally  . Total ethmoidectomy & sphenoidectomiy Bilateral 05/27/13    also reduction of turbinates  . Esophagogastroduodenoscopy N/A 03/10/2015    Procedure: ESOPHAGOGASTRODUODENOSCOPY (EGD);  Surgeon: Ladene Artist, MD;  Location: [facility];  Service: Endoscopy;  Laterality: N/A;    Social History Terry Lyons  reports that he quit smoking about 41 years ago. His smoking use included Cigarettes. He has never used smokeless tobacco. He reports that he does not drink alcohol or use illicit drugs.  family history includes Heart attack (age of onset: 74) in his brother; Heart attack (age of onset: 58) in his paternal grandfather; Heart attack (age of onset: 69) in his paternal grandmother; Heart disease in his father; Lung cancer in his maternal aunt and mother. There is no history of Diabetes, Stroke, Colon cancer, Pancreatic cancer, Rectal cancer, or Stomach cancer.  Allergies  Allergen Reactions  . Bupropion Hcl     REACTION: insomnia  . Codeine     REACTION: nausea  . Escitalopram Oxalate     REACTION: insomnia  . Sertraline Hcl     REACTION: insomnia  . Sulfa  Antibiotics     Pt can not remember  . Sulfur Other (See Comments)       PHYSICAL EXAMINATION: Vital signs: BP 100/60 mmHg  Pulse 84  Ht 5\' 7"  (1.702 m)  Wt 290 lb (131.543 kg)  BMI 45.41 kg/m2 General: Obese male, no acute distress HEENT: Sclerae are anicteric, conjunctiva pink. Oral mucosa intac Lungs: Clear Heart: Regular Abdomen: soft, obese, nontender, nondistended, no obvious ascites, no peritoneal signs, normal bowel sounds. No organomegaly. Extremities:  No clubbing cyanosis or edema Psychiatric: alert and oriented x3. Cooperative   ASSESSMENT:  #1. Recent problems with abdominal pain of uncertain etiology #2. Incidental mild Candida esophagitis, treated #3. Morbid obesity #4. GERD with history of peptic stricture #5. History of adenomatous colon Polyps #6. Submucosal mass post bulb or duodenum. Uncertain etiology or clinical significance. #7. Abnormal CT of the abdomen as described  PLAN:  #1. I reviewed the endoscopic findings and CT scan with Dr. Owens Loffler questioning possible endoscopic ultrasound as that except for further evaluation. His recommendation was further characterization of this area with advanced imaging. Specifically MRI/MRCP. This will be arranged. Thereafter, pending the outcome, a determination will be made regarding endoscopic ultrasound. In the interim, I have asked him to continue on PPI for GERD and continue with sustained weight loss. As well contact this office should he have recurrent symptoms while this is being further investigated. They understand and agree. #2. Surveillance colonoscopy 2020  40 minutes was spent face-to-face with the patient. Greater than 50% of the time with the patient and his wife reviewing multiple reports and findings. As well, discussing the abnormal endoscopic and x-ray findings as well as plans for further evaluation as described

## 2015-04-19 NOTE — Patient Instructions (Signed)
I will call you with your MRI/MRCP appointment time

## 2015-04-21 ENCOUNTER — Telehealth: Payer: Self-pay | Admitting: Internal Medicine

## 2015-04-21 ENCOUNTER — Telehealth: Payer: Self-pay

## 2015-04-21 NOTE — Telephone Encounter (Signed)
Spoke with patient's wife and gave her the appointment time for patient's open MRI at [place] - 04/28/2015 at 11:50am - arrive at 11:30am.  No solids for 4 hours prior to test.  She acknowledged and understood

## 2015-04-21 NOTE — Telephone Encounter (Signed)
-----   Message from Irene Shipper, MD sent at 04/21/2015  2:07 PM EST ----- If no allergy, Xanax 0.25 mg. Dispense 1. Take 30 minutes before procedure. Have someone else drive ----- Message -----    From: Audrea Muscat, CMA    Sent: 04/21/2015   1:53 PM      To: Irene Shipper, MD  I have scheduled patient for his MRI/MRCP at [place] so he can have an open test because of his claustrophobia.  He is requesting something for his nerves before the test.  Please advise

## 2015-04-22 DIAGNOSIS — R972 Elevated prostate specific antigen [PSA]: Secondary | ICD-10-CM | POA: Diagnosis not present

## 2015-04-22 DIAGNOSIS — E038 Other specified hypothyroidism: Secondary | ICD-10-CM | POA: Diagnosis not present

## 2015-04-22 DIAGNOSIS — I10 Essential (primary) hypertension: Secondary | ICD-10-CM | POA: Diagnosis not present

## 2015-04-22 MED ORDER — ALPRAZOLAM 0.25 MG PO TABS: ORAL_TABLET | ORAL | Status: DC

## 2015-04-22 NOTE — Telephone Encounter (Signed)
Told patient's wife that, per Dr. Henrene Pastor, I sent one .25 mg Xanax to the [facility] to be taken by patient 30 minutes before the MRI and that she would need to drive.  Ms. Besse acknowledged and understood

## 2015-04-22 NOTE — Telephone Encounter (Signed)
-----   Message from Irene Shipper, MD sent at 04/21/2015  2:07 PM EST ----- If no allergy, Xanax 0.25 mg. Dispense 1. Take 30 minutes before procedure. Have someone else drive ----- Message -----    From: Audrea Muscat, CMA    Sent: 04/21/2015   1:53 PM      To: Irene Shipper, MD  I have scheduled patient for his MRI/MRCP at [place] so he can have an open test because of his claustrophobia.  He is requesting something for his nerves before the test.  Please advise

## 2015-04-22 NOTE — Telephone Encounter (Signed)
Xanax sent per Dr. Henrene Pastor.  See phone note dated 04/22/2015

## 2015-04-27 ENCOUNTER — Telehealth: Payer: Self-pay | Admitting: Internal Medicine

## 2015-04-27 MED ORDER — ALPRAZOLAM 0.25 MG PO TABS: ORAL_TABLET | ORAL | Status: DC

## 2015-04-27 NOTE — Telephone Encounter (Signed)
Previously faxed Xanax rx did not make it to pharmacy.  Called pharmacy and gave rx directly to the pharmacist instead.

## 2015-04-28 ENCOUNTER — Ambulatory Visit
Admission: RE | Admit: 2015-04-28 | Discharge: 2015-04-28 | Disposition: A | Discharge status: Home / Self Care | Payer: PPO | Source: Ambulatory Visit | Attending: Internal Medicine | Admitting: Internal Medicine

## 2015-04-28 ENCOUNTER — Ambulatory Visit (HOSPITAL_COMMUNITY): Payer: PPO

## 2015-04-28 DIAGNOSIS — K3189 Other diseases of stomach and duodenum: Secondary | ICD-10-CM

## 2015-05-05 ENCOUNTER — Telehealth: Payer: Self-pay | Admitting: Internal Medicine

## 2015-05-06 NOTE — Telephone Encounter (Signed)
Pt was not able to complete the MRI. Wife states he just could not do it, he is a big man and was squeezed in there and it was an "open" mri. Wife states Dr. Henrene Pastor mentioned a different type test if he couldn't do MRI. Please advise.

## 2015-05-09 NOTE — Telephone Encounter (Signed)
Noted. I discussed this case with Dr. Ardis Hughs who felt MRI was next best test before committing to EUS. I will forward to Dr Ardis Hughs to see if proceeding to EUS at this point would be reasonable since he could not complete MRI. Thanks

## 2015-05-10 ENCOUNTER — Other Ambulatory Visit: Payer: Self-pay

## 2015-05-10 DIAGNOSIS — K319 Disease of stomach and duodenum, unspecified: Secondary | ICD-10-CM

## 2015-05-10 NOTE — Telephone Encounter (Signed)
EUS scheduled, pt instructed and medications reviewed.  Patient instructions mailed to home.  Patient to call with any questions or concerns.  

## 2015-05-10 NOTE — Telephone Encounter (Signed)
Thanks Linna Hoff. Vaughan Basta, please note. Notify patient of plans. Thanks

## 2015-05-10 NOTE — Telephone Encounter (Signed)
John, I think EUS is a good step now.  Thanks  Patty, He needs upper EUS, radial +/- linear, ++MAC, next available EUS Thursday for abnormal duodenum.  Thanks

## 2015-05-10 NOTE — Telephone Encounter (Signed)
Spoke with pt and he is aware. Pt waiting on call from Patty to scheduled EUS. Patty please call pt to schedule.

## 2015-05-12 DIAGNOSIS — M1712 Unilateral primary osteoarthritis, left knee: Secondary | ICD-10-CM | POA: Diagnosis not present

## 2015-05-13 ENCOUNTER — Encounter (HOSPITAL_COMMUNITY): Payer: Self-pay | Admitting: *Deleted

## 2015-05-19 DIAGNOSIS — M1712 Unilateral primary osteoarthritis, left knee: Secondary | ICD-10-CM | POA: Diagnosis not present

## 2015-05-19 NOTE — Progress Notes (Signed)
Consulted Dr. Landry Dyke about results of Chest x-ray done on 0125/2017. Per Dr. Landry Dyke, patient needs to have a repeat chest x-ray done.

## 2015-05-20 ENCOUNTER — Encounter (HOSPITAL_COMMUNITY): Payer: Self-pay

## 2015-05-20 ENCOUNTER — Ambulatory Visit (HOSPITAL_COMMUNITY): Payer: No Typology Code available for payment source | Admitting: Certified Registered"

## 2015-05-20 ENCOUNTER — Ambulatory Visit (HOSPITAL_COMMUNITY)
Admission: RE | Admit: 2015-05-20 | Discharge: 2015-05-20 | Disposition: A | Discharge status: Home / Self Care | Payer: No Typology Code available for payment source | Source: Ambulatory Visit | Attending: Gastroenterology | Admitting: Gastroenterology

## 2015-05-20 ENCOUNTER — Encounter (HOSPITAL_COMMUNITY)
Admission: RE | Disposition: A | Discharge status: Home / Self Care | Payer: Self-pay | Source: Ambulatory Visit | Attending: Gastroenterology

## 2015-05-20 DIAGNOSIS — I509 Heart failure, unspecified: Secondary | ICD-10-CM | POA: Insufficient documentation

## 2015-05-20 DIAGNOSIS — E669 Obesity, unspecified: Secondary | ICD-10-CM | POA: Diagnosis not present

## 2015-05-20 DIAGNOSIS — Z7984 Long term (current) use of oral hypoglycemic drugs: Secondary | ICD-10-CM | POA: Insufficient documentation

## 2015-05-20 DIAGNOSIS — Z85828 Personal history of other malignant neoplasm of skin: Secondary | ICD-10-CM | POA: Diagnosis not present

## 2015-05-20 DIAGNOSIS — K3189 Other diseases of stomach and duodenum: Secondary | ICD-10-CM

## 2015-05-20 DIAGNOSIS — E119 Type 2 diabetes mellitus without complications: Secondary | ICD-10-CM | POA: Insufficient documentation

## 2015-05-20 DIAGNOSIS — N189 Chronic kidney disease, unspecified: Secondary | ICD-10-CM | POA: Diagnosis not present

## 2015-05-20 DIAGNOSIS — D649 Anemia, unspecified: Secondary | ICD-10-CM | POA: Insufficient documentation

## 2015-05-20 DIAGNOSIS — N289 Disorder of kidney and ureter, unspecified: Secondary | ICD-10-CM | POA: Insufficient documentation

## 2015-05-20 DIAGNOSIS — I110 Hypertensive heart disease with heart failure: Secondary | ICD-10-CM | POA: Insufficient documentation

## 2015-05-20 DIAGNOSIS — Z87891 Personal history of nicotine dependence: Secondary | ICD-10-CM | POA: Diagnosis not present

## 2015-05-20 DIAGNOSIS — E785 Hyperlipidemia, unspecified: Secondary | ICD-10-CM | POA: Insufficient documentation

## 2015-05-20 DIAGNOSIS — M109 Gout, unspecified: Secondary | ICD-10-CM | POA: Diagnosis not present

## 2015-05-20 DIAGNOSIS — Z96651 Presence of right artificial knee joint: Secondary | ICD-10-CM | POA: Diagnosis not present

## 2015-05-20 DIAGNOSIS — I129 Hypertensive chronic kidney disease with stage 1 through stage 4 chronic kidney disease, or unspecified chronic kidney disease: Secondary | ICD-10-CM | POA: Diagnosis not present

## 2015-05-20 DIAGNOSIS — Z79899 Other long term (current) drug therapy: Secondary | ICD-10-CM | POA: Insufficient documentation

## 2015-05-20 DIAGNOSIS — K319 Disease of stomach and duodenum, unspecified: Secondary | ICD-10-CM | POA: Diagnosis present

## 2015-05-20 HISTORY — PX: EUS: SHX5427

## 2015-05-20 LAB — GLUCOSE, CAPILLARY: GLUCOSE-CAPILLARY: 115 mg/dL — AB (ref 65–99)

## 2015-05-20 SURGERY — UPPER ENDOSCOPIC ULTRASOUND (EUS) RADIAL
Anesthesia: Monitor Anesthesia Care

## 2015-05-20 MED ORDER — LIDOCAINE HCL (CARDIAC) 20 MG/ML IV SOLN
INTRAVENOUS | Status: DC | PRN
  Administered 2015-05-20: 100 mg via INTRAVENOUS

## 2015-05-20 MED ORDER — MIDAZOLAM HCL 2 MG/2ML IJ SOLN
INTRAMUSCULAR | Status: AC
  Filled 2015-05-20: qty 2

## 2015-05-20 MED ORDER — LACTATED RINGERS IV SOLN
INTRAVENOUS | Status: DC | PRN
  Administered 2015-05-20: 13:00:00 via INTRAVENOUS

## 2015-05-20 MED ORDER — LIDOCAINE HCL (CARDIAC) 20 MG/ML IV SOLN
INTRAVENOUS | Status: AC
  Filled 2015-05-20: qty 5

## 2015-05-20 MED ORDER — BUTAMBEN-TETRACAINE-BENZOCAINE 2-2-14 % EX AERO
INHALATION_SPRAY | CUTANEOUS | Status: DC | PRN
  Administered 2015-05-20: 2 via TOPICAL

## 2015-05-20 MED ORDER — PROPOFOL 10 MG/ML IV BOLUS
INTRAVENOUS | Status: AC
  Filled 2015-05-20: qty 40

## 2015-05-20 MED ORDER — PROPOFOL 500 MG/50ML IV EMUL
INTRAVENOUS | Status: DC | PRN
  Administered 2015-05-20: 200 ug/kg/min via INTRAVENOUS

## 2015-05-20 MED ORDER — PROPOFOL 10 MG/ML IV BOLUS
INTRAVENOUS | Status: AC
  Filled 2015-05-20: qty 20

## 2015-05-20 MED ORDER — MIDAZOLAM HCL 5 MG/5ML IJ SOLN
INTRAMUSCULAR | Status: DC | PRN
  Administered 2015-05-20: 2 mg via INTRAVENOUS

## 2015-05-20 MED ORDER — PROPOFOL 10 MG/ML IV BOLUS
INTRAVENOUS | Status: DC | PRN
  Administered 2015-05-20: 90 mg via INTRAVENOUS

## 2015-05-20 NOTE — Anesthesia Preprocedure Evaluation (Addendum)
Anesthesia Evaluation  Patient identified by MRN, date of birth, ID band Patient awake    Reviewed: Allergy & Precautions, NPO status , Patient's Chart, lab work & pertinent test results, reviewed documented beta blocker date and time   Airway Mallampati: II   Neck ROM: full    Dental   Pulmonary former smoker,    breath sounds clear to auscultation       Cardiovascular hypertension, Pt. on medications and Pt. on home beta blockers +CHF   Rhythm:regular Rate:Normal  12/2013 Nuclear stress test: LV Ejection Fraction: 58%. LV Wall Motion: NL LV Function; NL Wall Motion    Neuro/Psych negative neurological ROS     GI/Hepatic Neg liver ROS, GERD  ,  Endo/Other  diabetes, Type 2, Oral Hypoglycemic AgentsHypothyroidism   Renal/GU Renal InsufficiencyRenal disease     Musculoskeletal  (+) Arthritis ,   Abdominal   Peds  Hematology  (+) anemia ,   Anesthesia Other Findings   Reproductive/Obstetrics                            Lab Results  Component Value Date   WBC 15.4* 03/11/2015   HGB 12.2* 03/11/2015   HCT 37.1* 03/11/2015   MCV 93.9 03/11/2015   PLT 230 03/11/2015   Lab Results  Component Value Date   CREATININE 1.32* 03/13/2015   BUN 20 03/13/2015   NA 138 03/13/2015   K 3.6 03/13/2015   CL 91* 03/13/2015   CO2 33* 03/13/2015    Anesthesia Physical Anesthesia Plan  ASA: III  Anesthesia Plan: MAC   Post-op Pain Management:    Induction: Intravenous  Airway Management Planned: Natural Airway and Nasal Cannula  Additional Equipment:   Intra-op Plan:   Post-operative Plan:   Informed Consent: I have reviewed the patients History and Physical, chart, labs and discussed the procedure including the risks, benefits and alternatives for the proposed anesthesia with the patient or authorized representative who has indicated his/her understanding and acceptance.     Plan  Discussed with: CRNA  Anesthesia Plan Comments:         Anesthesia Quick Evaluation

## 2015-05-20 NOTE — Transfer of Care (Signed)
Immediate Anesthesia Transfer of Care Note  Patient: Terry Lyons  Procedure(s) Performed: Procedure(s): UPPER ENDOSCOPIC ULTRASOUND (EUS) RADIAL (N/A)  Patient Location: PACU  Anesthesia Type:MAC  Level of Consciousness:  sedated, patient cooperative and responds to stimulation  Airway & Oxygen Therapy:Patient Spontanous Breathing and Patient connected to nasal oxgen  Post-op Assessment:  Report given to PACU RN and Post -op Vital signs reviewed and stable  Post vital signs:  Reviewed and stable  Last Vitals:  Filed Vitals:   05/20/15 1230  BP: 154/81  Pulse: 76  Temp: 36.9 C  Resp: 19    Complications: No apparent anesthesia complications

## 2015-05-20 NOTE — Op Note (Signed)
[facility] Patient Name: Terry Lyons Procedure Date: 05/20/2015 MRN: [id number] Attending MD: Milus Banister , MD Date of Birth: 15-Jun-1945 CSN:  Age: 70 Admit Type: Outpatient Procedure:                Upper EUS Indications:              Duodenal deformity on endoscopy/Subepithelial tumor                            vs. extrinsic compression (noted on recent EGD with                            Dr. Fuller Plan) Providers:                Milus Banister, MD, Laverta Baltimore, RN,                            William Dalton, Technician Referring MD:             Scarlette Shorts, MD Medicines:                Monitored Anesthesia Care Complications:            No immediate complications. Estimated blood loss:                            None. Estimated Blood Loss:     Estimated blood loss: none. Procedure:                Pre-Anesthesia Assessment:                           - Prior to the procedure, a History and Physical                            was performed, and patient medications and                            allergies were reviewed. The patient's tolerance of                            previous anesthesia was also reviewed. The risks                            and benefits of the procedure and the sedation                            options and risks were discussed with the patient.                            All questions were answered, and informed consent                            was obtained. Prior Anticoagulants: The patient has                            taken no previous  anticoagulant or antiplatelet                            agents. ASA Grade Assessment: II - A patient with                            mild systemic disease. After reviewing the risks                            and benefits, the patient was deemed in                            satisfactory condition to undergo the procedure.                           After obtaining informed consent, the endoscope was                           passed under direct vision. Throughout the                            procedure, the patient's blood pressure, pulse, and                            oxygen saturations were monitored continuously. The                            [id number]) scope was introduced through the                            mouth, and advanced to the second part of duodenum.                            The [id number]) scope was introduced                            through the mouth, and advanced to the second part                            of duodenum. The upper EUS was accomplished without                            difficulty. The patient tolerated the procedure                            well. Scope In: Scope Out: Findings:      Endoscopic Finding :      The examined esophagus was endoscopically normal.      The entire examined stomach was endoscopically normal.      The examined duodenum was endoscopically normal. The previously       described abnormal duodenum was normal on this examination.      Endosonographic Finding :      1. The duodenal wall was normal. No masses, tumors, compression by  adjacent structures.      2. Limited views of pancreas, biliary tree, liver and spleen were all       normal. Impression:               Normal examination. The previously described                            abnormal duodenum was normal on this examination. Moderate Sedation:      N/A- Per Anesthesia Care Recommendation:           - Patient has a contact number available for                            emergencies. The signs and symptoms of potential                            delayed complications were discussed with the                            patient. Return to normal activities tomorrow.                            Written discharge instructions were provided to the                            patient.                           - Resume previous diet.                            - Follow up with Dr. Henrene Pastor as needed Procedure Code(s):        --- Professional ---                           [phone], Esophagogastroduodenoscopy, flexible,                            transoral; with endoscopic ultrasound examination,                            including the esophagus, stomach, and either the                            duodenum or a surgically altered stomach where the                            jejunum is examined distal to the anastomosis Diagnosis Code(s):        --- Professional ---                           K31.89, Other diseases of stomach and duodenum CPT copyright 2016 American Medical Association. All rights reserved. The codes documented in this report are preliminary and upon coder review may  be revised to meet current compliance requirements. Milus Banister, MD 05/20/2015 1:20:21 PM This report has been signed  electronically. Number of Addenda: 0

## 2015-05-20 NOTE — H&P (Signed)
  HPI: This is a man sent by dr. Henrene Pastor to evaluate abnormal duodenum (noted on recent EGD Dr. Fuller Plan)  Chief complaint is abnromal duodenum   Past Medical History  Diagnosis Date  . GERD (gastroesophageal reflux disease)   . Gout   . History of basal cell carcinoma     W-S Derm  . History of squamous cell carcinoma   . Hyperlipidemia   . Tachycardia   . Hypertension   . Allergy   . Rosacea   . Diabetes mellitus   . Arthritis   . Obesity   . Colon polyps     adenomatous    Past Surgical History  Procedure Laterality Date  . Tonsillectomy    . Pilonidal cyst excision    . Knee arthroscopy      R  . Colonoscopy with polypectomy      X3  . Esophagus dilated      X3  . Shoulder surgery      right shoulder  . Total knee arthroplasty      RIGHT  . Knee arthroscopy      bilaterally  . Total ethmoidectomy & sphenoidectomiy Bilateral 05/27/13    also reduction of turbinates  . Esophagogastroduodenoscopy N/A 03/10/2015    Procedure: ESOPHAGOGASTRODUODENOSCOPY (EGD);  Surgeon: Ladene Artist, MD;  Location: [facility];  Service: Endoscopy;  Laterality: N/A;    No current facility-administered medications for this encounter.    Allergies as of 05/10/2015 - Review Complete 04/19/2015  Allergen Reaction Noted  . Bupropion hcl  07/12/2007  . Codeine  07/12/2007  . Escitalopram oxalate  07/12/2007  . Sertraline hcl  07/12/2007  . Sulfa antibiotics  01/25/2014  . Sulfur Other (See Comments) 10/18/2010    Family History  Problem Relation Age of Onset  . Lung cancer Mother     smoker  . Heart attack Brother 68    CBAG  . Lung cancer Maternal Aunt     smoker  . Heart attack Paternal Grandmother 64  . Heart disease Father     Rheumatic Heart Disease  . Heart attack Paternal Grandfather 56  . Diabetes Neg Hx   . Stroke Neg Hx   . Colon cancer Neg Hx   . Pancreatic cancer Neg Hx   . Rectal cancer Neg Hx   . Stomach cancer Neg Hx     Social History   Social  History  . Marital Status: Married    Spouse Name: N/A  . Number of Children: N/A  . Years of Education: N/A   Occupational History  . Not on file.   Social History Main Topics  . Smoking status: Former Smoker    Types: Cigarettes    Quit date: 10/17/1973  . Smokeless tobacco: Never Used  . Alcohol Use: No  . Drug Use: No  . Sexual Activity: Not on file   Other Topics Concern  . Not on file   Social History Narrative     Physical Exam: There were no vitals taken for this visit. Constitutional: generally well-appearing Psychiatric: alert and oriented x3 Abdomen: soft, nontender, nondistended, no obvious ascites, no peritoneal signs, normal bowel sounds   Assessment and plan: 70 y.o. male with abnromal duodenum  For upper EUS today.   Owens Loffler, MD [facility] 05/20/2015, 12:31 PM

## 2015-05-20 NOTE — Discharge Instructions (Signed)

## 2015-05-21 ENCOUNTER — Encounter (HOSPITAL_COMMUNITY): Payer: Self-pay | Admitting: Gastroenterology

## 2015-05-24 NOTE — Anesthesia Postprocedure Evaluation (Signed)
Anesthesia Post Note  Patient: Terry Lyons  Procedure(s) Performed: Procedure(s) (LRB): UPPER ENDOSCOPIC ULTRASOUND (EUS) RADIAL (N/A)  Patient location during evaluation: PACU Anesthesia Type: MAC Level of consciousness: awake and alert Pain management: pain level controlled Vital Signs Assessment: post-procedure vital signs reviewed and stable Respiratory status: spontaneous breathing, nonlabored ventilation, respiratory function stable and patient connected to nasal cannula oxygen Cardiovascular status: stable and blood pressure returned to baseline Anesthetic complications: no    Last Vitals:  Filed Vitals:   05/20/15 1340 05/20/15 1350  BP: 115/68 116/61  Pulse: 69   Temp:    Resp: 20     Last Pain: There were no vitals filed for this visit.               [place]

## 2015-05-26 DIAGNOSIS — M1712 Unilateral primary osteoarthritis, left knee: Secondary | ICD-10-CM | POA: Diagnosis not present

## 2015-07-07 DIAGNOSIS — E119 Type 2 diabetes mellitus without complications: Secondary | ICD-10-CM | POA: Diagnosis not present

## 2015-07-07 DIAGNOSIS — M109 Gout, unspecified: Secondary | ICD-10-CM | POA: Diagnosis not present

## 2015-07-07 DIAGNOSIS — Z6841 Body Mass Index (BMI) 40.0 and over, adult: Secondary | ICD-10-CM | POA: Diagnosis not present

## 2015-07-07 DIAGNOSIS — I5031 Acute diastolic (congestive) heart failure: Secondary | ICD-10-CM | POA: Diagnosis not present

## 2015-07-07 DIAGNOSIS — I10 Essential (primary) hypertension: Secondary | ICD-10-CM | POA: Diagnosis not present

## 2015-10-01 DIAGNOSIS — M109 Gout, unspecified: Secondary | ICD-10-CM | POA: Diagnosis not present

## 2015-10-01 DIAGNOSIS — E119 Type 2 diabetes mellitus without complications: Secondary | ICD-10-CM | POA: Diagnosis not present

## 2015-10-01 DIAGNOSIS — I10 Essential (primary) hypertension: Secondary | ICD-10-CM | POA: Diagnosis not present

## 2015-10-01 DIAGNOSIS — E559 Vitamin D deficiency, unspecified: Secondary | ICD-10-CM | POA: Diagnosis not present

## 2015-10-01 DIAGNOSIS — E039 Hypothyroidism, unspecified: Secondary | ICD-10-CM | POA: Diagnosis not present

## 2015-10-01 DIAGNOSIS — E785 Hyperlipidemia, unspecified: Secondary | ICD-10-CM | POA: Diagnosis not present

## 2015-10-01 DIAGNOSIS — Z125 Encounter for screening for malignant neoplasm of prostate: Secondary | ICD-10-CM | POA: Diagnosis not present

## 2015-10-05 DIAGNOSIS — R972 Elevated prostate specific antigen [PSA]: Secondary | ICD-10-CM | POA: Diagnosis not present

## 2015-10-05 DIAGNOSIS — E038 Other specified hypothyroidism: Secondary | ICD-10-CM | POA: Diagnosis not present

## 2015-10-05 DIAGNOSIS — Z0000 Encounter for general adult medical examination without abnormal findings: Secondary | ICD-10-CM | POA: Diagnosis not present

## 2015-10-05 DIAGNOSIS — Z6841 Body Mass Index (BMI) 40.0 and over, adult: Secondary | ICD-10-CM | POA: Diagnosis not present

## 2015-10-05 DIAGNOSIS — Z23 Encounter for immunization: Secondary | ICD-10-CM | POA: Diagnosis not present

## 2015-10-05 DIAGNOSIS — B3781 Candidal esophagitis: Secondary | ICD-10-CM | POA: Diagnosis not present

## 2015-10-05 DIAGNOSIS — I5031 Acute diastolic (congestive) heart failure: Secondary | ICD-10-CM | POA: Diagnosis not present

## 2015-10-05 DIAGNOSIS — E559 Vitamin D deficiency, unspecified: Secondary | ICD-10-CM | POA: Diagnosis not present

## 2015-10-05 DIAGNOSIS — G4709 Other insomnia: Secondary | ICD-10-CM | POA: Diagnosis not present

## 2015-10-05 DIAGNOSIS — E119 Type 2 diabetes mellitus without complications: Secondary | ICD-10-CM | POA: Diagnosis not present

## 2015-10-05 DIAGNOSIS — Z1389 Encounter for screening for other disorder: Secondary | ICD-10-CM | POA: Diagnosis not present

## 2015-10-05 DIAGNOSIS — M5489 Other dorsalgia: Secondary | ICD-10-CM | POA: Diagnosis not present

## 2015-10-05 DIAGNOSIS — E784 Other hyperlipidemia: Secondary | ICD-10-CM | POA: Diagnosis not present

## 2015-10-07 DIAGNOSIS — Z1212 Encounter for screening for malignant neoplasm of rectum: Secondary | ICD-10-CM | POA: Diagnosis not present

## 2015-10-20 DIAGNOSIS — E118 Type 2 diabetes mellitus with unspecified complications: Secondary | ICD-10-CM | POA: Diagnosis not present

## 2015-10-20 DIAGNOSIS — I739 Peripheral vascular disease, unspecified: Secondary | ICD-10-CM | POA: Diagnosis not present

## 2015-10-20 DIAGNOSIS — L600 Ingrowing nail: Secondary | ICD-10-CM | POA: Diagnosis not present

## 2015-10-20 DIAGNOSIS — E1142 Type 2 diabetes mellitus with diabetic polyneuropathy: Secondary | ICD-10-CM | POA: Diagnosis not present

## 2015-12-03 DIAGNOSIS — M1712 Unilateral primary osteoarthritis, left knee: Secondary | ICD-10-CM | POA: Diagnosis not present

## 2016-01-26 ENCOUNTER — Encounter: Payer: Self-pay | Admitting: Cardiology

## 2016-02-03 ENCOUNTER — Ambulatory Visit (INDEPENDENT_AMBULATORY_CARE_PROVIDER_SITE_OTHER): Payer: PPO | Admitting: Cardiology

## 2016-02-03 ENCOUNTER — Encounter: Payer: Self-pay | Admitting: Cardiology

## 2016-02-03 VITALS — BP 122/66 | HR 73 | Ht 67.0 in | Wt 292.0 lb

## 2016-02-03 DIAGNOSIS — E782 Mixed hyperlipidemia: Secondary | ICD-10-CM | POA: Diagnosis not present

## 2016-02-03 DIAGNOSIS — R0600 Dyspnea, unspecified: Secondary | ICD-10-CM

## 2016-02-03 DIAGNOSIS — E7849 Other hyperlipidemia: Secondary | ICD-10-CM

## 2016-02-03 DIAGNOSIS — R0609 Other forms of dyspnea: Secondary | ICD-10-CM

## 2016-02-03 DIAGNOSIS — I10 Essential (primary) hypertension: Secondary | ICD-10-CM

## 2016-02-03 DIAGNOSIS — E784 Other hyperlipidemia: Secondary | ICD-10-CM | POA: Diagnosis not present

## 2016-02-03 DIAGNOSIS — E8881 Metabolic syndrome: Secondary | ICD-10-CM | POA: Diagnosis not present

## 2016-02-03 NOTE — Progress Notes (Signed)
Patient ID: Terry Lyons, male   DOB: Aug 09, 1945, 70 y.o.   MRN: [id number]     Patient Name: Terry Lyons Date of Encounter: 02/03/2016  Primary Care Provider:  [name], MD Primary Cardiologist: Ena Dawley  Problem List   Past Medical History:  Diagnosis Date  . Allergy   . Arthritis   . Colon polyps    adenomatous  . Diabetes mellitus   . GERD (gastroesophageal reflux disease)   . Gout   . History of basal cell carcinoma    W-S Derm  . History of squamous cell carcinoma   . Hyperlipidemia   . Hypertension   . Obesity   . Rosacea   . Tachycardia    Past Surgical History:  Procedure Laterality Date  . colonoscopy with polypectomy     X3  . ESOPHAGOGASTRODUODENOSCOPY N/A 03/10/2015   Procedure: ESOPHAGOGASTRODUODENOSCOPY (EGD);  Surgeon: Ladene Artist, MD;  Location: [facility];  Service: Endoscopy;  Laterality: N/A;  . esophagus dilated     X3  . EUS N/A 05/20/2015   Procedure: UPPER ENDOSCOPIC ULTRASOUND (EUS) RADIAL;  Surgeon: Milus Banister, MD;  Location: [facility];  Service: Endoscopy;  Laterality: N/A;  . KNEE ARTHROSCOPY     R  . KNEE ARTHROSCOPY     bilaterally  . PILONIDAL CYST EXCISION    . SHOULDER SURGERY     right shoulder  . TONSILLECTOMY    . total ethmoidectomy & sphenoidectomiy Bilateral 05/27/13   also reduction of turbinates  . TOTAL KNEE ARTHROPLASTY     RIGHT    Allergies  Allergies  Allergen Reactions  . Bupropion Hcl     REACTION: insomnia  . Codeine     REACTION: nausea  . Escitalopram Oxalate     REACTION: insomnia  . Sertraline Hcl     REACTION: insomnia  . Sulfa Antibiotics     Pt can not remember  . Sulfur Other (See Comments)   Chief complain: DOE  HPI  70 year old male with metabolic syndrome that includes hyper triglyceridemia, obesity, type II non-insulin-dependent diabetes, physical inactivity who is coming with concern of worsening dyspnea on exertion. He states that he completely decrease his  physical activity and right now he is walking minimally he attributes it that it started with knee replacement however, walking to shower would make him short of breath. He denies any chest pain, palpitations or syncope. He has chronic lower extremity edema that is nonpitting and has been there for 3-4 years. On he had a nuclear stress test in the past that was negative. It has been over 7 years doesn't remember exactly when. He also had an echocardiogram in 2010 that was nonconclusive with possible inferior wall hypokinesis. The patient has a very significant family history of coronary artery disease. His brother died of myocardial infarction 58, 2 of his grandmothers both died in their 75s of myocardial infarction. His father died of complications of rheumatic heart disease at age of 97. He knows his high triglycerides and was prescribed Lipitor but is reluctant because he is worrying about side effects with worsening diabetes.  02/03/2016 - 1 year follow up, the patient denies any symptoms, no CP, stable dyspnea on moderate exertion, however remains inactive  He is trying to stick to low sodium diet, denies LE edema, orthopnea. No palpitations or syncope.   Home Medications  Prior to Admission medications   Medication Sig Start Date End Date Taking? Authorizing Provider  allopurinol (ZYLOPRIM) 300 MG  tablet Take 150-300 mg by mouth 2 (two) times daily. 1 tablet (300 mg) in the morning and 0.5 tablets (150 mg) at night   Yes Historical Provider, MD  aspirin EC 81 MG tablet Take 81 mg by mouth daily.   Yes Historical Provider, MD  Calcium Carbonate (CALCIUM 600) 1500 MG TABS Take 1,500 mg by mouth daily.    Yes Historical Provider, MD  Cholecalciferol (VITAMIN D PO) Take 1 tablet by mouth daily.   Yes Historical Provider, MD  Coenzyme Q10 (CO Q 10 PO) Take 1 capsule by mouth daily.    Yes Historical Provider, MD  diphenhydramine-acetaminophen (TYLENOL PM) 25-500 MG TABS Take 1 tablet by mouth at  bedtime as needed (sleep).    Yes Historical Provider, MD  doxazosin (CARDURA) 4 MG tablet Take 1 tablet (4 mg total) by mouth at bedtime. 08/24/11  Yes Hendricks Limes, MD  meloxicam (MOBIC) 7.5 MG tablet Take 1 tablet (7.5 mg total) by mouth daily. 05/27/12  Yes Hendricks Limes, MD  metFORMIN (GLUCOPHAGE) 500 MG tablet Take 1 tablet (500 mg total) by mouth daily with breakfast. 1 by mouth daily with breakfast, LABS DUE Patient taking differently: Take 500 mg by mouth daily with breakfast. LABS DUE 05/27/12  Yes Hendricks Limes, MD  metoprolol tartrate (LOPRESSOR) 25 MG tablet Take 12.5 mg by mouth 2 (two) times daily.   Yes Historical Provider, MD  Multiple Vitamin (MULTIVITAMIN PO) Take 1 capsule by mouth daily.    Yes Historical Provider, MD  mupirocin cream (BACTROBAN) 2 % Apply 1 application topically daily.   Yes Historical Provider, MD  Omega-3 Fatty Acids (FISH OIL) 1000 MG CAPS Take 1 capsule by mouth daily.    Yes Historical Provider, MD  omeprazole (PRILOSEC) 20 MG capsule Take 20-40 mg by mouth daily.    Yes Historical Provider, MD  pseudoephedrine-acetaminophen (TYLENOL SINUS) 30-500 MG TABS Take 2 tablets by mouth every 4 (four) hours as needed (headache).   Yes Historical Provider, MD  pseudoephedrine-guaifenesin (MUCINEX D) 60-600 MG per tablet Take 1 tablet by mouth daily.    Yes Historical Provider, MD  spironolactone (ALDACTONE) 25 MG tablet Take 1 tablet (25 mg total) by mouth daily. 05/22/13  Yes Hendricks Limes, MD    Family History  Family History  Problem Relation Age of Onset  . Lung cancer Mother     smoker  . Heart disease Father     Rheumatic Heart Disease  . Heart attack Brother 76    CBAG  . Lung cancer Maternal Aunt     smoker  . Heart attack Paternal Grandmother 20  . Heart attack Paternal Grandfather 30  . Diabetes Neg Hx   . Stroke Neg Hx   . Colon cancer Neg Hx   . Pancreatic cancer Neg Hx   . Rectal cancer Neg Hx   . Stomach cancer Neg Hx      Social History  Social History   Social History  . Marital status: Married    Spouse name: N/A  . Number of children: N/A  . Years of education: N/A   Occupational History  . Not on file.   Social History Main Topics  . Smoking status: Former Smoker    Types: Cigarettes    Quit date: 10/17/1973  . Smokeless tobacco: Never Used  . Alcohol use No  . Drug use: No  . Sexual activity: Not on file   Other Topics Concern  . Not on file  Social History Narrative  . No narrative on file     Review of Systems, as per HPI, otherwise negative General:  No chills, fever, night sweats or weight changes.  Cardiovascular:  No chest pain, dyspnea on exertion, edema, orthopnea, palpitations, paroxysmal nocturnal dyspnea. Dermatological: No rash, lesions/masses Respiratory: No cough, dyspnea Urologic: No hematuria, dysuria Abdominal:   No nausea, vomiting, diarrhea, bright red blood per rectum, melena, or hematemesis Neurologic:  No visual changes, wkns, changes in mental status. All other systems reviewed and are otherwise negative except as noted above.  Physical Exam  Blood pressure 122/66, pulse 73, height 5\' 7"  (1.702 m), weight 292 lb (132.5 kg).  General: Pleasant, NAD Psych: Normal affect. Neuro: Alert and oriented X 3. Moves all extremities spontaneously. HEENT: Normal  Neck: Supple without bruits or JVD. Lungs:  Resp regular and unlabored, CTA. Heart: RRR no s3, s4, or murmurs. Abdomen: Soft, non-tender, non-distended, BS + x 4.  Extremities: No clubbing, cyanosis, mild non-pitting edema. DP/PT/Radials 2+ and equal bilaterally.  Labs:  No results for input(s): CKTOTAL, CKMB, TROPONINI in the last 72 hours. Lab Results  Component Value Date   WBC 15.4 (H) 03/11/2015   HGB 12.2 (L) 03/11/2015   HCT 37.1 (L) 03/11/2015   MCV 93.9 03/11/2015   PLT 230 03/11/2015    Lab Results  Component Value Date   DDIMER (H) 06/09/2008    1.34        AT THE INHOUSE  ESTABLISHED CUTOFF VALUE OF 0.48 ug/mL FEU, THIS ASSAY HAS BEEN DOCUMENTED IN THE LITERATURE TO HAVE A SENSITIVITY AND NEGATIVE PREDICTIVE VALUE OF AT LEAST 98 TO 99%.  THE TEST RESULT SHOULD BE CORRELATED WITH AN ASSESSMENT OF THE CLINICAL PROBABILITY OF DVT / VTE.   Invalid input(s): POCBNP    Component Value Date/Time   NA 138 03/13/2015 0540   K 3.6 03/13/2015 0540   CL 91 (L) 03/13/2015 0540   CO2 33 (H) 03/13/2015 0540   GLUCOSE 171 (H) 03/13/2015 0540   BUN 20 03/13/2015 0540   CREATININE 1.32 (H) 03/13/2015 0540   CALCIUM 8.9 03/13/2015 0540   PROT 6.1 (L) 03/11/2015 0630   ALBUMIN 2.5 (L) 03/11/2015 0630   AST 53 (H) 03/11/2015 0630   ALT 31 03/11/2015 0630   ALKPHOS 93 03/11/2015 0630   BILITOT 2.3 (H) 03/11/2015 0630   GFRNONAA 53 (L) 03/13/2015 0540   GFRAA >60 03/13/2015 0540   Lab Results  Component Value Date   CHOL 188 12/21/2010   HDL 45.30 12/21/2010   TRIG 252.0 (H) 12/21/2010    Accessory Clinical Findings  Echocardiogram - 2010  SUMMARY - EF difficult to assess due to poor images. Possible inferobasal    hypokinesis Left ventricular wall thickness was mildly    increased. - The aortic valve was mildly calcified. - Posterior annular calcification  ECG - NSR, normal ECG  Lexiscan nuclear stress test 01/09/14 Impression Exercise Capacity: Lexiscan with no exercise. BP Response: Normal blood pressure response. Clinical Symptoms: Dyspnea.  ECG Impression: No significant ST segment change suggestive of ischemia. Comparison with Prior Nuclear Study: No images to compare  Overall Impression: Low risk stress nuclear study with a fixed, small, mild apical lateral and apical perfusion defect. Possible attenuation but cannot rule out small infarction. No ischemia. .  LV Ejection Fraction: 58%. LV Wall Motion: NL LV Function; NL Wall Motion     Assessment & Plan  1. Dyspnea on exertion - the patient has all the risk  factor including that obesity, hypertension, untreated hyperlipidemia, obesity, physical inactivity, significant family history of premature coronary artery disease, prior history of smoking and he has also been exposed to agent orange. Lexiscan nuclear stress test was negative for scar or ischemia. We will focus on aggressive risk factor modification predominantly lipids management. He is deconditioned, definitely needs to start exercising and loosing weight.   2. Hypertension - controlled  3. Hyperlipidemia - patient is strongly advised to start taking Lipitor, he is adamant about not taking statins, continue fish oil and red yeast rice  4. Metabolic syndrome X, obesity - patient advised on proper exercise level and intensity he seems to be motivated and wanting to join Silver sneakers.  Follow-up in 4 months.   Ena Dawley, MD, [facility] 02/03/2016, 11:44 AM

## 2016-02-03 NOTE — Patient Instructions (Signed)
Medication Instructions:   Your physician recommends that you continue on your current medications as directed. Please refer to the Current Medication list given to you today.    Follow-Up:  Your physician wants you to follow-up in: 4 MONTHS WITH DR NELSON You will receive a reminder letter in the mail two months in advance. If you don't receive a letter, please call our office to schedule the follow-up appointment.        If you need a refill on your cardiac medications before your next appointment, please call your pharmacy.   

## 2016-03-22 DIAGNOSIS — M1712 Unilateral primary osteoarthritis, left knee: Secondary | ICD-10-CM | POA: Diagnosis not present

## 2016-03-29 DIAGNOSIS — M1712 Unilateral primary osteoarthritis, left knee: Secondary | ICD-10-CM | POA: Diagnosis not present

## 2016-04-03 DIAGNOSIS — Z6841 Body Mass Index (BMI) 40.0 and over, adult: Secondary | ICD-10-CM | POA: Diagnosis not present

## 2016-04-03 DIAGNOSIS — E038 Other specified hypothyroidism: Secondary | ICD-10-CM | POA: Diagnosis not present

## 2016-04-03 DIAGNOSIS — M109 Gout, unspecified: Secondary | ICD-10-CM | POA: Diagnosis not present

## 2016-04-03 DIAGNOSIS — I10 Essential (primary) hypertension: Secondary | ICD-10-CM | POA: Diagnosis not present

## 2016-04-03 DIAGNOSIS — E119 Type 2 diabetes mellitus without complications: Secondary | ICD-10-CM | POA: Diagnosis not present

## 2016-04-03 DIAGNOSIS — M1990 Unspecified osteoarthritis, unspecified site: Secondary | ICD-10-CM | POA: Diagnosis not present

## 2016-04-05 DIAGNOSIS — M1712 Unilateral primary osteoarthritis, left knee: Secondary | ICD-10-CM | POA: Diagnosis not present

## 2016-05-01 DIAGNOSIS — G589 Mononeuropathy, unspecified: Secondary | ICD-10-CM | POA: Diagnosis not present

## 2016-05-01 DIAGNOSIS — Z6841 Body Mass Index (BMI) 40.0 and over, adult: Secondary | ICD-10-CM | POA: Diagnosis not present

## 2016-05-18 DIAGNOSIS — M1712 Unilateral primary osteoarthritis, left knee: Secondary | ICD-10-CM | POA: Diagnosis not present

## 2016-06-06 DIAGNOSIS — M19012 Primary osteoarthritis, left shoulder: Secondary | ICD-10-CM | POA: Diagnosis not present

## 2016-06-06 DIAGNOSIS — M7541 Impingement syndrome of right shoulder: Secondary | ICD-10-CM | POA: Diagnosis not present

## 2016-06-20 DIAGNOSIS — M5414 Radiculopathy, thoracic region: Secondary | ICD-10-CM | POA: Diagnosis not present

## 2016-06-20 DIAGNOSIS — S23420A Sprain of sternoclavicular (joint) (ligament), initial encounter: Secondary | ICD-10-CM | POA: Diagnosis not present

## 2016-06-20 DIAGNOSIS — M9902 Segmental and somatic dysfunction of thoracic region: Secondary | ICD-10-CM | POA: Diagnosis not present

## 2016-06-20 DIAGNOSIS — M9904 Segmental and somatic dysfunction of sacral region: Secondary | ICD-10-CM | POA: Diagnosis not present

## 2016-06-20 DIAGNOSIS — M5413 Radiculopathy, cervicothoracic region: Secondary | ICD-10-CM | POA: Diagnosis not present

## 2016-06-20 DIAGNOSIS — S336XXA Sprain of sacroiliac joint, initial encounter: Secondary | ICD-10-CM | POA: Diagnosis not present

## 2016-06-20 DIAGNOSIS — M9907 Segmental and somatic dysfunction of upper extremity: Secondary | ICD-10-CM | POA: Diagnosis not present

## 2016-06-20 DIAGNOSIS — M9901 Segmental and somatic dysfunction of cervical region: Secondary | ICD-10-CM | POA: Diagnosis not present

## 2016-06-22 DIAGNOSIS — M9902 Segmental and somatic dysfunction of thoracic region: Secondary | ICD-10-CM | POA: Diagnosis not present

## 2016-06-22 DIAGNOSIS — S336XXA Sprain of sacroiliac joint, initial encounter: Secondary | ICD-10-CM | POA: Diagnosis not present

## 2016-06-22 DIAGNOSIS — M9904 Segmental and somatic dysfunction of sacral region: Secondary | ICD-10-CM | POA: Diagnosis not present

## 2016-06-22 DIAGNOSIS — M9901 Segmental and somatic dysfunction of cervical region: Secondary | ICD-10-CM | POA: Diagnosis not present

## 2016-06-22 DIAGNOSIS — M9907 Segmental and somatic dysfunction of upper extremity: Secondary | ICD-10-CM | POA: Diagnosis not present

## 2016-06-22 DIAGNOSIS — M5413 Radiculopathy, cervicothoracic region: Secondary | ICD-10-CM | POA: Diagnosis not present

## 2016-06-22 DIAGNOSIS — S23420A Sprain of sternoclavicular (joint) (ligament), initial encounter: Secondary | ICD-10-CM | POA: Diagnosis not present

## 2016-06-22 DIAGNOSIS — M5414 Radiculopathy, thoracic region: Secondary | ICD-10-CM | POA: Diagnosis not present

## 2016-06-27 DIAGNOSIS — S23420A Sprain of sternoclavicular (joint) (ligament), initial encounter: Secondary | ICD-10-CM | POA: Diagnosis not present

## 2016-06-27 DIAGNOSIS — M5414 Radiculopathy, thoracic region: Secondary | ICD-10-CM | POA: Diagnosis not present

## 2016-06-27 DIAGNOSIS — M5413 Radiculopathy, cervicothoracic region: Secondary | ICD-10-CM | POA: Diagnosis not present

## 2016-06-27 DIAGNOSIS — S336XXA Sprain of sacroiliac joint, initial encounter: Secondary | ICD-10-CM | POA: Diagnosis not present

## 2016-06-27 DIAGNOSIS — M9902 Segmental and somatic dysfunction of thoracic region: Secondary | ICD-10-CM | POA: Diagnosis not present

## 2016-06-27 DIAGNOSIS — M9907 Segmental and somatic dysfunction of upper extremity: Secondary | ICD-10-CM | POA: Diagnosis not present

## 2016-06-27 DIAGNOSIS — M9901 Segmental and somatic dysfunction of cervical region: Secondary | ICD-10-CM | POA: Diagnosis not present

## 2016-06-27 DIAGNOSIS — M9904 Segmental and somatic dysfunction of sacral region: Secondary | ICD-10-CM | POA: Diagnosis not present

## 2016-06-30 ENCOUNTER — Encounter: Payer: Self-pay | Admitting: Cardiology

## 2016-06-30 ENCOUNTER — Ambulatory Visit (INDEPENDENT_AMBULATORY_CARE_PROVIDER_SITE_OTHER): Payer: PPO | Admitting: Cardiology

## 2016-06-30 VITALS — BP 124/80 | HR 72 | Ht 67.0 in | Wt 301.0 lb

## 2016-06-30 DIAGNOSIS — I5031 Acute diastolic (congestive) heart failure: Secondary | ICD-10-CM

## 2016-06-30 DIAGNOSIS — E782 Mixed hyperlipidemia: Secondary | ICD-10-CM | POA: Diagnosis not present

## 2016-06-30 DIAGNOSIS — R0609 Other forms of dyspnea: Secondary | ICD-10-CM

## 2016-06-30 DIAGNOSIS — E8881 Metabolic syndrome: Secondary | ICD-10-CM

## 2016-06-30 DIAGNOSIS — I10 Essential (primary) hypertension: Secondary | ICD-10-CM

## 2016-06-30 DIAGNOSIS — R0600 Dyspnea, unspecified: Secondary | ICD-10-CM

## 2016-06-30 MED ORDER — FUROSEMIDE 40 MG PO TABS: 40.0000 mg | ORAL_TABLET | Freq: Two times a day (BID) | ORAL | 2 refills | Status: DC

## 2016-06-30 NOTE — Patient Instructions (Signed)
Medication Instructions:   INCREASE YOUR LASIX TO 40 MG TWICE DAILY    Labwork:  TODAY--BMET AND PRO-BNP     Follow-Up:   2 WEEKS WITH AN EXTENDER ON DR NELSON'S TEAM   IN September WITH DR Meda Coffee       If you need a refill on your cardiac medications before your next appointment, please call your pharmacy.

## 2016-06-30 NOTE — Progress Notes (Signed)
Patient ID: Terry Lyons, male   DOB: 06-Feb-1946, 71 y.o.   MRN: [id number]     Patient Name: Terry Lyons Date of Encounter: 06/30/2016  Primary Care Provider:  Crist Infante, MD Primary Cardiologist: Ena Dawley  Problem List   Past Medical History:  Diagnosis Date  . Allergy   . Arthritis   . Colon polyps    adenomatous  . Diabetes mellitus   . GERD (gastroesophageal reflux disease)   . Gout   . History of basal cell carcinoma    W-S Derm  . History of squamous cell carcinoma   . Hyperlipidemia   . Hypertension   . Obesity   . Rosacea   . Tachycardia    Past Surgical History:  Procedure Laterality Date  . colonoscopy with polypectomy     X3  . ESOPHAGOGASTRODUODENOSCOPY N/A 03/10/2015   Procedure: ESOPHAGOGASTRODUODENOSCOPY (EGD);  Surgeon: Ladene Artist, MD;  Location: [facility];  Service: Endoscopy;  Laterality: N/A;  . esophagus dilated     X3  . EUS N/A 05/20/2015   Procedure: UPPER ENDOSCOPIC ULTRASOUND (EUS) RADIAL;  Surgeon: Milus Banister, MD;  Location: [facility];  Service: Endoscopy;  Laterality: N/A;  . KNEE ARTHROSCOPY     R  . KNEE ARTHROSCOPY     bilaterally  . PILONIDAL CYST EXCISION    . SHOULDER SURGERY     right shoulder  . TONSILLECTOMY    . total ethmoidectomy & sphenoidectomiy Bilateral 05/27/13   also reduction of turbinates  . TOTAL KNEE ARTHROPLASTY     RIGHT    Allergies  Allergies  Allergen Reactions  . Bupropion Hcl     REACTION: insomnia  . Codeine     REACTION: nausea  . Escitalopram Oxalate     REACTION: insomnia  . Sertraline Hcl     REACTION: insomnia  . Sulfa Antibiotics     Pt can not remember  . Sulfur Other (See Comments)   Chief complain: DOE  HPI  71 year old male with metabolic syndrome that includes hyper triglyceridemia, obesity, type II non-insulin-dependent diabetes, physical inactivity who is coming with concern of worsening dyspnea on exertion. He states that he completely decrease his physical  activity and right now he is walking minimally he attributes it that it started with knee replacement however, walking to shower would make him short of breath. He denies any chest pain, palpitations or syncope. He has chronic lower extremity edema that is nonpitting and has been there for 3-4 years. On he had a nuclear stress test in the past that was negative. It has been over 7 years doesn't remember exactly when. He also had an echocardiogram in 2010 that was nonconclusive with possible inferior wall hypokinesis. The patient has a very significant family history of coronary artery disease. His brother died of myocardial infarction 42, 2 of his grandmothers both died in their 75s of myocardial infarction. His father died of complications of rheumatic heart disease at age of 94. He knows his high triglycerides and was prescribed Lipitor but is reluctant because he is worrying about side effects with worsening diabetes.  02/03/2016 - 1 year follow up, the patient denies any symptoms, no CP, stable dyspnea on moderate exertion, however remains inactive  He is trying to stick to low sodium diet, denies LE edema, orthopnea. No palpitations or syncope.   06/30/2016 - the patient is coming after 6 months, he complains that he has gained 8 pounds in the last 2 weeks, he  states he has been compliant to his medications however he is not restricting himself in regards with food and eats out approximately 3 times a week. He at salt to his diet. He doesn't exercise. He has been experiencing shoulder pain and cervical radiculopathy for which she is seeing an orthopedic surgeon and a chiropractor. He denies any palpitations, no orthopnea paroxysmal nocturnal dyspnea no chest pain.  Home Medications  Prior to Admission medications   Medication Sig Start Date End Date Taking? Authorizing Provider  allopurinol (ZYLOPRIM) 300 MG tablet Take 150-300 mg by mouth 2 (two) times daily. 1 tablet (300 mg) in the morning and 0.5  tablets (150 mg) at night   Yes Historical Provider, MD  aspirin EC 81 MG tablet Take 81 mg by mouth daily.   Yes Historical Provider, MD  Calcium Carbonate (CALCIUM 600) 1500 MG TABS Take 1,500 mg by mouth daily.    Yes Historical Provider, MD  Cholecalciferol (VITAMIN D PO) Take 1 tablet by mouth daily.   Yes Historical Provider, MD  Coenzyme Q10 (CO Q 10 PO) Take 1 capsule by mouth daily.    Yes Historical Provider, MD  diphenhydramine-acetaminophen (TYLENOL PM) 25-500 MG TABS Take 1 tablet by mouth at bedtime as needed (sleep).    Yes Historical Provider, MD  doxazosin (CARDURA) 4 MG tablet Take 1 tablet (4 mg total) by mouth at bedtime. 08/24/11  Yes Hendricks Limes, MD  meloxicam (MOBIC) 7.5 MG tablet Take 1 tablet (7.5 mg total) by mouth daily. 05/27/12  Yes Hendricks Limes, MD  metFORMIN (GLUCOPHAGE) 500 MG tablet Take 1 tablet (500 mg total) by mouth daily with breakfast. 1 by mouth daily with breakfast, LABS DUE Patient taking differently: Take 500 mg by mouth daily with breakfast. LABS DUE 05/27/12  Yes Hendricks Limes, MD  metoprolol tartrate (LOPRESSOR) 25 MG tablet Take 12.5 mg by mouth 2 (two) times daily.   Yes Historical Provider, MD  Multiple Vitamin (MULTIVITAMIN PO) Take 1 capsule by mouth daily.    Yes Historical Provider, MD  mupirocin cream (BACTROBAN) 2 % Apply 1 application topically daily.   Yes Historical Provider, MD  Omega-3 Fatty Acids (FISH OIL) 1000 MG CAPS Take 1 capsule by mouth daily.    Yes Historical Provider, MD  omeprazole (PRILOSEC) 20 MG capsule Take 20-40 mg by mouth daily.    Yes Historical Provider, MD  pseudoephedrine-acetaminophen (TYLENOL SINUS) 30-500 MG TABS Take 2 tablets by mouth every 4 (four) hours as needed (headache).   Yes Historical Provider, MD  pseudoephedrine-guaifenesin (MUCINEX D) 60-600 MG per tablet Take 1 tablet by mouth daily.    Yes Historical Provider, MD  spironolactone (ALDACTONE) 25 MG tablet Take 1 tablet (25 mg total) by mouth  daily. 05/22/13  Yes Hendricks Limes, MD    Family History  Family History  Problem Relation Age of Onset  . Lung cancer Mother        smoker  . Heart disease Father        Rheumatic Heart Disease  . Heart attack Brother 31       CBAG  . Lung cancer Maternal Aunt        smoker  . Heart attack Paternal Grandmother 33  . Heart attack Paternal Grandfather 42  . Diabetes Neg Hx   . Stroke Neg Hx   . Colon cancer Neg Hx   . Pancreatic cancer Neg Hx   . Rectal cancer Neg Hx   . Stomach cancer Neg  Hx     Social History  Social History   Social History  . Marital status: Married    Spouse name: N/A  . Number of children: N/A  . Years of education: N/A   Occupational History  . Not on file.   Social History Main Topics  . Smoking status: Former Smoker    Types: Cigarettes    Quit date: 10/17/1973  . Smokeless tobacco: Never Used  . Alcohol use No  . Drug use: No  . Sexual activity: Not on file   Other Topics Concern  . Not on file   Social History Narrative  . No narrative on file     Review of Systems, as per HPI, otherwise negative General:  No chills, fever, night sweats or weight changes.  Cardiovascular:  No chest pain, dyspnea on exertion, edema, orthopnea, palpitations, paroxysmal nocturnal dyspnea. Dermatological: No rash, lesions/masses Respiratory: No cough, dyspnea Urologic: No hematuria, dysuria Abdominal:   No nausea, vomiting, diarrhea, bright red blood per rectum, melena, or hematemesis Neurologic:  No visual changes, wkns, changes in mental status. All other systems reviewed and are otherwise negative except as noted above.  Physical Exam  Blood pressure 124/80, pulse 72, height 5\' 7"  (1.702 m), weight (!) 301 lb (136.5 kg), SpO2 95 %.  General: Pleasant, NAD Psych: Normal affect. Neuro: Alert and oriented X 3. Moves all extremities spontaneously. HEENT: Normal  Neck: Supple without bruits or JVD. Lungs:  Resp regular and unlabored,  CTA. Heart: RRR no s3, s4, or murmurs. Abdomen: Soft, non-tender, non-distended, BS + x 4.  Extremities: No clubbing, cyanosis, B/L LE edema up to his knees. DP/PT/Radials 2+ and equal bilaterally.  Labs:  No results for input(s): CKTOTAL, CKMB, TROPONINI in the last 72 hours. Lab Results  Component Value Date   WBC 15.4 (H) 03/11/2015   HGB 12.2 (L) 03/11/2015   HCT 37.1 (L) 03/11/2015   MCV 93.9 03/11/2015   PLT 230 03/11/2015    Lab Results  Component Value Date   DDIMER (H) 06/09/2008    1.34        AT THE INHOUSE ESTABLISHED CUTOFF VALUE OF 0.48 ug/mL FEU, THIS ASSAY HAS BEEN DOCUMENTED IN THE LITERATURE TO HAVE A SENSITIVITY AND NEGATIVE PREDICTIVE VALUE OF AT LEAST 98 TO 99%.  THE TEST RESULT SHOULD BE CORRELATED WITH AN ASSESSMENT OF THE CLINICAL PROBABILITY OF DVT / VTE.   Invalid input(s): POCBNP    Component Value Date/Time   NA 138 03/13/2015 0540   K 3.6 03/13/2015 0540   CL 91 (L) 03/13/2015 0540   CO2 33 (H) 03/13/2015 0540   GLUCOSE 171 (H) 03/13/2015 0540   BUN 20 03/13/2015 0540   CREATININE 1.32 (H) 03/13/2015 0540   CALCIUM 8.9 03/13/2015 0540   PROT 6.1 (L) 03/11/2015 0630   ALBUMIN 2.5 (L) 03/11/2015 0630   AST 53 (H) 03/11/2015 0630   ALT 31 03/11/2015 0630   ALKPHOS 93 03/11/2015 0630   BILITOT 2.3 (H) 03/11/2015 0630   GFRNONAA 53 (L) 03/13/2015 0540   GFRAA >60 03/13/2015 0540   Lab Results  Component Value Date   CHOL 188 12/21/2010   HDL 45.30 12/21/2010   TRIG 252.0 (H) 12/21/2010    Accessory Clinical Findings  Echocardiogram - 2010  SUMMARY - EF difficult to assess due to poor images. Possible inferobasal    hypokinesis Left ventricular wall thickness was mildly    increased. - The aortic valve was mildly calcified. - Posterior annular calcification  ECG - NSR, normal ECG  Lexiscan nuclear stress test 01/09/14 Impression Exercise Capacity: Lexiscan with no exercise. BP Response: Normal blood  pressure response. Clinical Symptoms: Dyspnea.  ECG Impression: No significant ST segment change suggestive of ischemia. Comparison with Prior Nuclear Study: No images to compare  Overall Impression: Low risk stress nuclear study with a fixed, small, mild apical lateral and apical perfusion defect. Possible attenuation but cannot rule out small infarction. No ischemia. .  LV Ejection Fraction: 58%. LV Wall Motion: NL LV Function; NL Wall Motion     Assessment & Plan  1. Dyspnea on exertion - the patient has all the risk factor including that obesity, hypertension, untreated hyperlipidemia, obesity, physical inactivity, significant family history of premature coronary artery disease, prior history of smoking and he has also been exposed to agent orange. Lexiscan nuclear stress test was negative for scar or ischemia.  He doesn't exercise at all and is severely deconditioned and obese. He doesn't seem to be motivated to lose any weight or make any dietary changes. Again importance of regular exercise was explained he will try to start walking 10 minutes daily and bili that up to 30 minutes.  2. Acute on chronic diastolic CHF - we'll check BMP and BNP today, increase Lasix to 40 mg by mouth twice a day, if BNP significantly elevated will increase to 80 in morning and 40 the afternoon. He will follow with a PA in 2 weeks.  2. Hypertension - controlled  3. Hyperlipidemia - patient is strongly advised to start taking Lipitor, he is adamant about not taking statins, continue fish oil and red yeast rice  4. Metabolic syndrome X, obesity - patient advised on proper exercise level and intensity he seems to be motivated and wanting to join Silver sneakers.  Follow-up in 2 weeks with a PA for CHF, with me in 3 months.   Ena Dawley, MD, [facility] 06/30/2016, 4:14 PM

## 2016-07-01 LAB — BASIC METABOLIC PANEL
BUN/Creatinine Ratio: 13 (ref 10–24)
BUN: 14 mg/dL (ref 8–27)
CO2: 26 mmol/L (ref 18–29)
Calcium: 9.4 mg/dL (ref 8.6–10.2)
Chloride: 96 mmol/L (ref 96–106)
Creatinine, Ser: 1.12 mg/dL (ref 0.76–1.27)
GFR calc Af Amer: 75 mL/min/{1.73_m2} (ref >59)
GFR calc non Af Amer: 65 mL/min/{1.73_m2} (ref >59)
Glucose: 129 mg/dL — ABNORMAL HIGH (ref 65–99)
Potassium: 4.3 mmol/L (ref 3.5–5.2)
Sodium: 139 mmol/L (ref 134–144)

## 2016-07-01 LAB — PRO B NATRIURETIC PEPTIDE: NT-Pro BNP: 76 pg/mL (ref 0–376)

## 2016-07-04 DIAGNOSIS — M9907 Segmental and somatic dysfunction of upper extremity: Secondary | ICD-10-CM | POA: Diagnosis not present

## 2016-07-04 DIAGNOSIS — S336XXA Sprain of sacroiliac joint, initial encounter: Secondary | ICD-10-CM | POA: Diagnosis not present

## 2016-07-04 DIAGNOSIS — M9904 Segmental and somatic dysfunction of sacral region: Secondary | ICD-10-CM | POA: Diagnosis not present

## 2016-07-04 DIAGNOSIS — M9901 Segmental and somatic dysfunction of cervical region: Secondary | ICD-10-CM | POA: Diagnosis not present

## 2016-07-04 DIAGNOSIS — M5413 Radiculopathy, cervicothoracic region: Secondary | ICD-10-CM | POA: Diagnosis not present

## 2016-07-04 DIAGNOSIS — S23420A Sprain of sternoclavicular (joint) (ligament), initial encounter: Secondary | ICD-10-CM | POA: Diagnosis not present

## 2016-07-04 DIAGNOSIS — M5414 Radiculopathy, thoracic region: Secondary | ICD-10-CM | POA: Diagnosis not present

## 2016-07-04 DIAGNOSIS — M9902 Segmental and somatic dysfunction of thoracic region: Secondary | ICD-10-CM | POA: Diagnosis not present

## 2016-07-05 DIAGNOSIS — M25511 Pain in right shoulder: Secondary | ICD-10-CM | POA: Diagnosis not present

## 2016-07-05 DIAGNOSIS — G8929 Other chronic pain: Secondary | ICD-10-CM | POA: Diagnosis not present

## 2016-07-11 ENCOUNTER — Telehealth: Payer: Self-pay | Admitting: *Deleted

## 2016-07-11 ENCOUNTER — Encounter: Payer: Self-pay | Admitting: Nurse Practitioner

## 2016-07-11 NOTE — Telephone Encounter (Signed)
S/w pt moved upcoming appointment due to provider emergency, pt aware.

## 2016-07-12 ENCOUNTER — Encounter: Payer: Self-pay | Admitting: Nurse Practitioner

## 2016-07-12 ENCOUNTER — Ambulatory Visit (INDEPENDENT_AMBULATORY_CARE_PROVIDER_SITE_OTHER): Payer: PPO | Admitting: Nurse Practitioner

## 2016-07-12 VITALS — BP 117/76 | HR 72 | Ht 68.0 in | Wt 295.8 lb

## 2016-07-12 DIAGNOSIS — R0600 Dyspnea, unspecified: Secondary | ICD-10-CM

## 2016-07-12 DIAGNOSIS — I5031 Acute diastolic (congestive) heart failure: Secondary | ICD-10-CM

## 2016-07-12 DIAGNOSIS — R0609 Other forms of dyspnea: Secondary | ICD-10-CM

## 2016-07-12 DIAGNOSIS — I10 Essential (primary) hypertension: Secondary | ICD-10-CM

## 2016-07-12 LAB — BASIC METABOLIC PANEL
BUN/Creatinine Ratio: 16 (ref 10–24)
BUN: 18 mg/dL (ref 8–27)
CO2: 25 mmol/L (ref 18–29)
Calcium: 9.4 mg/dL (ref 8.6–10.2)
Chloride: 95 mmol/L — ABNORMAL LOW (ref 96–106)
Creatinine, Ser: 1.15 mg/dL (ref 0.76–1.27)
GFR calc Af Amer: 73 mL/min/{1.73_m2} (ref >59)
GFR calc non Af Amer: 63 mL/min/{1.73_m2} (ref >59)
Glucose: 208 mg/dL — ABNORMAL HIGH (ref 65–99)
Potassium: 4.3 mmol/L (ref 3.5–5.2)
Sodium: 137 mmol/L (ref 134–144)

## 2016-07-12 LAB — HEPATIC FUNCTION PANEL
ALT: 29 IU/L (ref 0–44)
AST: 18 IU/L (ref 0–40)
Albumin: 4 g/dL (ref 3.5–4.8)
Alkaline Phosphatase: 101 IU/L (ref 39–117)
Bilirubin Total: 0.5 mg/dL (ref 0.0–1.2)
Bilirubin, Direct: 0.2 mg/dL (ref 0.00–0.40)
Total Protein: 6.5 g/dL (ref 6.0–8.5)

## 2016-07-12 LAB — PRO B NATRIURETIC PEPTIDE: NT-Pro BNP: 66 pg/mL (ref 0–376)

## 2016-07-12 NOTE — Patient Instructions (Addendum)
We will be checking the following labs today - BNP, BMET and HPF   Medication Instructions:    Continue with your current medicines.     Testing/Procedures To Be Arranged:  N/A  Follow-Up:   See Dr. Meda Coffee as planned in September.     Other Special Instructions:   Think about what we talked about today.  Sodium restriction less than 1500 mg a day is advised    If you need a refill on your cardiac medications before your next appointment, please call your pharmacy.   Call the [place] office at [phone] if you have any questions, problems or concerns.

## 2016-07-12 NOTE — Progress Notes (Signed)
CARDIOLOGY OFFICE NOTE  Date:  07/12/2016    Terry Lyons Date of Birth: [birth year] Medical Record #[id number]  PCP:  Crist Infante, MD  Cardiologist:  Meda Coffee    Chief Complaint  Patient presents with  . Congestive Heart Failure    2 week check - seen for Dr. Meda Coffee    History of Present Illness: Terry Lyons is a 71 y.o. male who presents today for a follow up visit. Seen for Dr. Meda Coffee.   He has a history of hyper triglyceridemia, obesity, type II non-insulin-dependent diabetes, & physical inactivity. He was originally evaluated due to a concern of worsening dyspnea on exertion. Remote nuclear stress test that was negative. Echo from 2010 that was nonconclusive with possible inferior wall hypokinesis. FH + for coronary artery disease. His brother died of myocardial infarction 60, 2 of his grandmothers both died in their 85s of myocardial infarction. His father died of complications of rheumatic heart disease at age of 64.   Last seen earlier this month - noted weight gain, excess salt - treated for diastolic HF. Diuretics were increased.   Comes in today. Here with his wife. Down 6 pounds. He admits he loves salt - bought several bags of salt and vinegar chips yesterday. Breathing is ok. Not really motivated to exercise. Says he is not depressed. No chest pain. Not dizzy or lightheaded.   Past Medical History:  Diagnosis Date  . Allergy   . Arthritis   . Colon polyps    adenomatous  . Diabetes mellitus   . GERD (gastroesophageal reflux disease)   . Gout   . History of basal cell carcinoma    W-S Derm  . History of squamous cell carcinoma   . Hyperlipidemia   . Hypertension   . Obesity   . Rosacea   . Tachycardia     Past Surgical History:  Procedure Laterality Date  . colonoscopy with polypectomy     X3  . ESOPHAGOGASTRODUODENOSCOPY N/A 03/10/2015   Procedure: ESOPHAGOGASTRODUODENOSCOPY (EGD);  Surgeon: Ladene Artist, MD;  Location: [facility];  Service:  Endoscopy;  Laterality: N/A;  . esophagus dilated     X3  . EUS N/A 05/20/2015   Procedure: UPPER ENDOSCOPIC ULTRASOUND (EUS) RADIAL;  Surgeon: Milus Banister, MD;  Location: [facility];  Service: Endoscopy;  Laterality: N/A;  . KNEE ARTHROSCOPY     R  . KNEE ARTHROSCOPY     bilaterally  . PILONIDAL CYST EXCISION    . SHOULDER SURGERY     right shoulder  . TONSILLECTOMY    . total ethmoidectomy & sphenoidectomiy Bilateral 05/27/13   also reduction of turbinates  . TOTAL KNEE ARTHROPLASTY     RIGHT     Medications: Current Outpatient Prescriptions  Medication Sig Dispense Refill  . allopurinol (ZYLOPRIM) 300 MG tablet Take 300 mg by mouth daily.     Marland Kitchen aspirin EC 81 MG tablet Take 81 mg by mouth daily.    . cetirizine (ZYRTEC) 10 MG tablet Take 10 mg by mouth daily.    . Cholecalciferol (VITAMIN D PO) Take 1 tablet by mouth daily.    . Coenzyme Q10 (CO Q 10 PO) Take 1 capsule by mouth daily.     . cyclobenzaprine (FLEXERIL) 10 MG tablet Take 10 mg by mouth 3 (three) times daily as needed for muscle spasms.    Marland Kitchen doxazosin (CARDURA) 4 MG tablet Take 1 tablet (4 mg total) by mouth at bedtime. 90 tablet  1  . furosemide (LASIX) 40 MG tablet Take 1 tablet (40 mg total) by mouth 2 (two) times daily. 180 tablet 2  . gabapentin (NEURONTIN) 300 MG capsule Take 100 mg by mouth as directed.    Marland Kitchen HYDROcodone-acetaminophen (NORCO) 7.5-325 MG tablet Take 1 tablet by mouth every 4 (four) hours as needed. Pain.    Marland Kitchen ketoconazole (NIZORAL) 2 % cream Apply 1 application topically. Every 2-3 days when he washes hair.    Marland Kitchen levothyroxine (SYNTHROID, LEVOTHROID) 50 MCG tablet Take 50 mcg by mouth daily before breakfast.    . Menthol-Methyl Salicylate (THERA-GESIC EX) Apply 1 application topically daily as needed (knee pain.).    Marland Kitchen metFORMIN (GLUCOPHAGE) 500 MG tablet Take 500 mg by mouth 2 (two) times daily with a meal.    . metoprolol (LOPRESSOR) 50 MG tablet Take 50 mg by mouth 2 (two) times daily.      . Multiple Vitamin (MULTIVITAMIN WITH MINERALS) TABS tablet Take 1 tablet by mouth daily. PACKET    . Omega-3 Fatty Acids (FISH OIL) 1000 MG CAPS Take 1 capsule by mouth daily.     Marland Kitchen omeprazole (PRILOSEC) 20 MG capsule Take 20 mg by mouth 2 (two) times daily before a meal.    . potassium chloride 20 MEQ TBCR Take 10 mEq by mouth daily. 30 tablet 0  . Red Yeast Rice Extract (RED YEAST RICE PO) Take 1 capsule by mouth at bedtime.     . Saw Palmetto, Serenoa repens, (SAW PALMETTO PO) Take 1 capsule by mouth daily.    . temazepam (RESTORIL) 15 MG capsule Take 15 mg by mouth at bedtime as needed. Sleep    . triamcinolone cream (KENALOG) 0.1 % Apply 1 application topically daily as needed (skin on face.).     No current facility-administered medications for this visit.     Allergies: Allergies  Allergen Reactions  . Bupropion Hcl     REACTION: insomnia  . Codeine     REACTION: nausea  . Escitalopram Oxalate     REACTION: insomnia  . Sertraline Hcl     REACTION: insomnia  . Sulfa Antibiotics     Pt can not remember  . Sulfur Other (See Comments)    Social History: The patient  reports that he quit smoking about 42 years ago. His smoking use included Cigarettes. He has never used smokeless tobacco. He reports that he does not drink alcohol or use drugs.   Family History: The patient's family history includes Heart attack (age of onset: 33) in his brother; Heart attack (age of onset: 76) in his paternal grandfather; Heart attack (age of onset: 89) in his paternal grandmother; Heart disease in his father; Lung cancer in his maternal aunt and mother.   Review of Systems: Please see the history of present illness.   Otherwise, the review of systems is positive for none.   All other systems are reviewed and negative.   Physical Exam: VS:  BP 117/76 (BP Location: Left Arm, Patient Position: Sitting, Cuff Size: Large)   Pulse 72   Ht 5\' 8"  (1.727 m)   Wt 295 lb 12.8 oz (134.2 kg)   SpO2  95% Comment: at rest  BMI 44.98 kg/m  .  BMI Body mass index is 44.98 kg/m.  Wt Readings from Last 3 Encounters:  07/12/16 295 lb 12.8 oz (134.2 kg)  06/30/16 (!) 301 lb (136.5 kg)  02/03/16 292 lb (132.5 kg)    General: Pleasant. Obese male. He is alert  and in no acute distress. His weight is down 6 pounds.  He looks chronically ill to me - older than his stated age.  HEENT: Normal.  Neck: Supple, no JVD, carotid bruits, or masses noted.  Cardiac: Regular rate and rhythm. No murmurs, rubs, or gallops. No edema.  Respiratory:  Lungs are clear to auscultation bilaterally with normal work of breathing.  GI: Soft and nontender.  MS: No deformity or atrophy. Gait and ROM intact.  Skin: Warm and dry. Color is normal.  Neuro:  Strength and sensation are intact and no gross focal deficits noted.  Psych: Alert, appropriate and with normal affect.   LABORATORY DATA:  EKG:  EKG is not ordered today.  Lab Results  Component Value Date   WBC 15.4 (H) 03/11/2015   HGB 12.2 (L) 03/11/2015   HCT 37.1 (L) 03/11/2015   PLT 230 03/11/2015   GLUCOSE 129 (H) 06/30/2016   CHOL 188 12/21/2010   TRIG 252.0 (H) 12/21/2010   HDL 45.30 12/21/2010   LDLDIRECT 104.7 12/21/2010   ALT 31 03/11/2015   AST 53 (H) 03/11/2015   NA 139 06/30/2016   K 4.3 06/30/2016   CL 96 06/30/2016   CREATININE 1.12 06/30/2016   BUN 14 06/30/2016   CO2 26 06/30/2016   TSH 3.747 03/10/2015   PSA 3.05 07/11/2011   INR 1.2 06/09/2008   HGBA1C 7.1 (H) 03/06/2015   MICROALBUR 0.6 12/21/2010    BNP (last 3 results) No results for input(s): BNP in the last 8760 hours.  ProBNP (last 3 results)  Recent Labs  06/30/16 1546  PROBNP 76     Other Studies Reviewed Today:  Echo Study Conclusions 02/2015  - Left ventricle: The cavity size was normal. There was mild   concentric hypertrophy. Systolic function was vigorous. The   estimated ejection fraction was in the range of 65% to 70%. Wall   motion was  normal; there were no regional wall motion   abnormalities. Due to tachycardia, there was fusion of early and   atrial contributions to ventricular filling. The study is not   technically sufficient to allow evaluation of LV diastolic   function. - Aortic valve: There was mild stenosis. Valve area (VTI): 1.99   cm^2. Valve area (Vmax): 1.79 cm^2. Valve area (Vmean): 1.77   cm^2. - Mitral valve: Calcified annulus. Valve area by continuity   equation (using LVOT flow): 3.14 cm^2. - Left atrium: The atrium was mildly dilated. - Right ventricle: Systolic function was hyperdynamic.   Myoview Impression from 2015 Exercise Capacity:  Lexiscan with no exercise. BP Response:  Normal blood pressure response. Clinical Symptoms:  Dyspnea.  ECG Impression:  No significant ST segment change suggestive of ischemia. Comparison with Prior Nuclear Study: No images to compare  Overall Impression:  Low risk stress nuclear study with a fixed, small, mild apical lateral and apical perfusion defect.  Possible attenuation but cannot rule out small infarction.  No ischemia. .  LV Ejection Fraction: 58%.  LV Wall Motion:  NL LV Function; NL Wall Motion   [name] 01/07/2014   Assessment/Plan: 1. Acute on chronic diastolic HF - with multiple CV risk factors - does not really seem motivated to make any changes to positively influence his health - discussed at length today. His weight is down - recheck his labs today and will then adjust his diuretics if needed. Again, salt restriction is imperative.   2. HTN - BP ok on current regimen  3. HLD -  refuses statin therapy  4. Metabolic syndrome - multiple risk factors - unfortunately I do not think he has the insight to make the changes that he needs to make.   Current medicines are reviewed with the patient today.  The patient does not have concerns regarding medicines other than what has been noted above.  The following changes have been made:  See  above.  Labs/ tests ordered today include:    Orders Placed This Encounter  Procedures  . Basic metabolic panel  . Hepatic function panel  . Pro b natriuretic peptide (BNP)     Disposition:   FU with Dr. Meda Coffee as planned.    Patient is agreeable to this plan and will call if any problems develop in the interim.   SignedTruitt Merle, NP  07/12/2016 9:22 AM  [facility] [address] [place], [place]  [address] Phone: [phone] Fax: [phone]

## 2016-07-14 ENCOUNTER — Ambulatory Visit: Payer: PPO | Admitting: Nurse Practitioner

## 2016-10-06 DIAGNOSIS — R0781 Pleurodynia: Secondary | ICD-10-CM | POA: Diagnosis not present

## 2016-10-06 DIAGNOSIS — R209 Unspecified disturbances of skin sensation: Secondary | ICD-10-CM | POA: Diagnosis not present

## 2016-10-06 DIAGNOSIS — M539 Dorsopathy, unspecified: Secondary | ICD-10-CM | POA: Diagnosis not present

## 2016-10-06 DIAGNOSIS — Z6841 Body Mass Index (BMI) 40.0 and over, adult: Secondary | ICD-10-CM | POA: Diagnosis not present

## 2016-10-06 DIAGNOSIS — E669 Obesity, unspecified: Secondary | ICD-10-CM | POA: Diagnosis not present

## 2016-10-06 DIAGNOSIS — R079 Chest pain, unspecified: Secondary | ICD-10-CM | POA: Diagnosis not present

## 2016-10-06 DIAGNOSIS — I5031 Acute diastolic (congestive) heart failure: Secondary | ICD-10-CM | POA: Diagnosis not present

## 2016-10-24 ENCOUNTER — Other Ambulatory Visit: Payer: Self-pay | Admitting: Internal Medicine

## 2016-10-24 DIAGNOSIS — I10 Essential (primary) hypertension: Secondary | ICD-10-CM | POA: Diagnosis not present

## 2016-10-24 DIAGNOSIS — Q791 Other congenital malformations of diaphragm: Secondary | ICD-10-CM

## 2016-10-31 ENCOUNTER — Other Ambulatory Visit: Payer: PPO

## 2016-10-31 ENCOUNTER — Ambulatory Visit
Admission: RE | Admit: 2016-10-31 | Discharge: 2016-10-31 | Disposition: A | Discharge status: Home / Self Care | Payer: PPO | Source: Ambulatory Visit | Attending: Internal Medicine | Admitting: Internal Medicine

## 2016-10-31 DIAGNOSIS — J9811 Atelectasis: Secondary | ICD-10-CM | POA: Diagnosis not present

## 2016-10-31 DIAGNOSIS — Q791 Other congenital malformations of diaphragm: Secondary | ICD-10-CM

## 2016-10-31 MED ORDER — IOPAMIDOL (ISOVUE-300) INJECTION 61%
75.0000 mL | Freq: Once | INTRAVENOUS | Status: AC | PRN
  Administered 2016-10-31: 75 mL via INTRAVENOUS

## 2016-11-01 ENCOUNTER — Ambulatory Visit (INDEPENDENT_AMBULATORY_CARE_PROVIDER_SITE_OTHER): Payer: PPO | Admitting: Cardiology

## 2016-11-01 ENCOUNTER — Encounter: Payer: Self-pay | Admitting: Cardiology

## 2016-11-01 VITALS — BP 136/80 | HR 71 | Ht 68.0 in | Wt 304.0 lb

## 2016-11-01 DIAGNOSIS — I5031 Acute diastolic (congestive) heart failure: Secondary | ICD-10-CM | POA: Diagnosis not present

## 2016-11-01 DIAGNOSIS — Z5329 Procedure and treatment not carried out because of patient's decision for other reasons: Secondary | ICD-10-CM | POA: Diagnosis not present

## 2016-11-01 DIAGNOSIS — Z723 Lack of physical exercise: Secondary | ICD-10-CM | POA: Diagnosis not present

## 2016-11-01 DIAGNOSIS — Z91199 Patient's noncompliance with other medical treatment and regimen due to unspecified reason: Secondary | ICD-10-CM

## 2016-11-01 DIAGNOSIS — R0609 Other forms of dyspnea: Secondary | ICD-10-CM

## 2016-11-01 DIAGNOSIS — E785 Hyperlipidemia, unspecified: Secondary | ICD-10-CM

## 2016-11-01 DIAGNOSIS — E8881 Metabolic syndrome: Secondary | ICD-10-CM

## 2016-11-01 NOTE — Progress Notes (Signed)
Cardiology Office Note:    Date:  11/01/2016   ID:  Terry Lyons, DOB 01-28-1946, MRN [id number]  PCP:  Crist Infante, MD  Cardiologist:  Ena Dawley, MD   Referring MD: Crist Infante, MD   Chief complain: Shortness of breath and shoulder pain.  History of Present Illness:    Terry Lyons is a 71 y.o. male with a hx of morbid obesity, hyper triglyceridemia, obesity, type II non-insulin-dependent diabetes, & physical inactivity. He was originally evaluated due to a concern of worsening dyspnea on exertion. Remote nuclear stress test that was negative in 12/2013. Echo from 2010 that was nonconclusive with possible inferior wall hypokinesis. FH + for coronary artery disease. His brother died of myocardial infarction 24, 2 of his grandmothers both died in their 62s of myocardial infarction. His father died of complications of rheumatic heart disease at age of 67.   Last seen earlier this month - noted weight gain, excess salt - treated for diastolic HF. Diuretics were increased.   11/02/2016 - he comes back after 4 months, he complains of left knee pain and shoulder pain for which he is getting steroid shots into the knee and just underwent chest CT that didn't show any mass or lymphadenopathy. The patient is absolutely inactive he only moves is absolutely necessary like to the bathroom into bed and he states that he eats whatever he wants. It is unclear to me if he is depressed or has no insight into his problem but he has no interest whatsoever in starting any exercise or trying to lose weight. He states that he has been having chest pain with while in bed, it feels more like a rib pain his strength to lay in certain position because of his shoulder pain that creates chest wall pain that gets worse with inspiration. He takes hydrocodone for pain control.  Past Medical History:  Diagnosis Date  . Allergy   . Arthritis   . Colon polyps    adenomatous  . Diabetes mellitus   . GERD  (gastroesophageal reflux disease)   . Gout   . History of basal cell carcinoma    W-S Derm  . History of squamous cell carcinoma   . Hyperlipidemia   . Hypertension   . Obesity   . Rosacea   . Tachycardia     Past Surgical History:  Procedure Laterality Date  . colonoscopy with polypectomy     X3  . ESOPHAGOGASTRODUODENOSCOPY N/A 03/10/2015   Procedure: ESOPHAGOGASTRODUODENOSCOPY (EGD);  Surgeon: Ladene Artist, MD;  Location: [facility];  Service: Endoscopy;  Laterality: N/A;  . esophagus dilated     X3  . EUS N/A 05/20/2015   Procedure: UPPER ENDOSCOPIC ULTRASOUND (EUS) RADIAL;  Surgeon: Milus Banister, MD;  Location: [facility];  Service: Endoscopy;  Laterality: N/A;  . KNEE ARTHROSCOPY     R  . KNEE ARTHROSCOPY     bilaterally  . PILONIDAL CYST EXCISION    . SHOULDER SURGERY     right shoulder  . TONSILLECTOMY    . total ethmoidectomy & sphenoidectomiy Bilateral 05/27/13   also reduction of turbinates  . TOTAL KNEE ARTHROPLASTY     RIGHT    Current Medications: Current Meds  Medication Sig  . allopurinol (ZYLOPRIM) 300 MG tablet Take 300 mg by mouth daily.   Marland Kitchen aspirin EC 81 MG tablet Take 81 mg by mouth daily.  . cetirizine (ZYRTEC) 10 MG tablet Take 10 mg by mouth daily.  . Cholecalciferol (  VITAMIN D PO) Take 1 tablet by mouth daily.  . Coenzyme Q10 (CO Q 10 PO) Take 1 capsule by mouth daily.   . cyclobenzaprine (FLEXERIL) 10 MG tablet Take 10 mg by mouth 3 (three) times daily as needed for muscle spasms.  Marland Kitchen doxazosin (CARDURA) 4 MG tablet Take 1 tablet (4 mg total) by mouth at bedtime.  . furosemide (LASIX) 40 MG tablet Take 1 tablet (40 mg total) by mouth 2 (two) times daily.  Marland Kitchen gabapentin (NEURONTIN) 300 MG capsule Take 100 mg by mouth as directed.  Marland Kitchen HYDROcodone-acetaminophen (NORCO) 7.5-325 MG tablet Take 1 tablet by mouth every 4 (four) hours as needed. Pain.  Marland Kitchen ketoconazole (NIZORAL) 2 % cream Apply 1 application topically. Every 2-3 days when he washes  hair.  Marland Kitchen levothyroxine (SYNTHROID, LEVOTHROID) 50 MCG tablet Take 50 mcg by mouth daily before breakfast.  . Menthol-Methyl Salicylate (THERA-GESIC EX) Apply 1 application topically daily as needed (knee pain.).  Marland Kitchen metFORMIN (GLUCOPHAGE) 500 MG tablet Take 500 mg by mouth 2 (two) times daily with a meal.  . metoprolol (LOPRESSOR) 50 MG tablet Take 50 mg by mouth 2 (two) times daily.  . Multiple Vitamin (MULTIVITAMIN WITH MINERALS) TABS tablet Take 1 tablet by mouth daily. PACKET  . Omega-3 Fatty Acids (FISH OIL) 1000 MG CAPS Take 1 capsule by mouth daily.   Marland Kitchen omeprazole (PRILOSEC) 20 MG capsule Take 20 mg by mouth 2 (two) times daily before a meal.  . potassium chloride 20 MEQ TBCR Take 10 mEq by mouth daily.  . Red Yeast Rice Extract (RED YEAST RICE PO) Take 1 capsule by mouth at bedtime.   . Saw Palmetto, Serenoa repens, (SAW PALMETTO PO) Take 1 capsule by mouth daily.  . temazepam (RESTORIL) 15 MG capsule Take 15 mg by mouth at bedtime as needed. Sleep  . triamcinolone cream (KENALOG) 0.1 % Apply 1 application topically daily as needed (skin on face.).     Allergies:   Bupropion hcl; Codeine; Escitalopram oxalate; Sertraline hcl; Sulfa antibiotics; and Sulfur   Social History   Social History  . Marital status: Married    Spouse name: N/A  . Number of children: N/A  . Years of education: N/A   Social History Main Topics  . Smoking status: Former Smoker    Types: Cigarettes    Quit date: 10/17/1973  . Smokeless tobacco: Never Used  . Alcohol use No  . Drug use: No  . Sexual activity: Not Asked   Other Topics Concern  . None   Social History Narrative  . None     Family History: The patient's family history includes Heart attack (age of onset: 48) in his brother; Heart attack (age of onset: 11) in his paternal grandfather; Heart attack (age of onset: 102) in his paternal grandmother; Heart disease in his father; Lung cancer in his maternal aunt and mother. There is no history  of Diabetes, Stroke, Colon cancer, Pancreatic cancer, Rectal cancer, or Stomach cancer. ROS:   Please see the history of present illness.     All other systems reviewed and are negative.  EKGs/Labs/Other Studies Reviewed:    The following studies were reviewed today:  EKG:  EKG is  ordered today.  The ekg ordered today demonstrates Sinus rhythm, normal EKG unchanged from prior this is grossly reviewed.  Recent Labs: 07/12/2016: ALT 29; BUN 18; Creatinine, Ser 1.15; NT-Pro BNP 66; Potassium 4.3; Sodium 137  Recent Lipid Panel    Component Value Date/Time  CHOL 188 12/21/2010 1109   TRIG 252.0 (H) 12/21/2010 1109   HDL 45.30 12/21/2010 1109   CHOLHDL 4 12/21/2010 1109   VLDL 50.4 (H) 12/21/2010 1109   LDLDIRECT 104.7 12/21/2010 1109    Physical Exam:    VS:  BP 136/80   Pulse 71   Ht 5\' 8"  (1.727 m)   Wt (!) 304 lb (137.9 kg)   SpO2 97%   BMI 46.22 kg/m     Wt Readings from Last 3 Encounters:  11/01/16 (!) 304 lb (137.9 kg)  07/12/16 295 lb 12.8 oz (134.2 kg)  06/30/16 (!) 301 lb (136.5 kg)     GEN: morbidly obese HEENT: Normal NECK: No JVD; No carotid bruits LYMPHATICS: No lymphadenopathy CARDIAC: RRR, 2/6 systolic murmur, rubs, gallops RESPIRATORY:  Clear to auscultation without rales, wheezing or rhonchi  ABDOMEN: Soft, non-tender, non-distended MUSCULOSKELETAL:  No edema; No deformity  SKIN: Warm and dry NEUROLOGIC:  Alert and oriented x 3 PSYCHIATRIC:  Normal affect   ASSESSMENT:    1. Acute diastolic CHF (congestive heart failure) ([place])   2. Hyperlipidemia, unspecified hyperlipidemia type   3. DOE (dyspnea on exertion)   4. Metabolic syndrome X   5. Physically inactive   6. Noncompliance by refusing intervention or support    PLAN:    In order of problems listed above:  1. Acute on chronic diastolic HF - with multiple CV risk factors - We have previously checked his BNP was always normal. His shortness of breath and swelling is most probably  related to absolute physical inactivity, morbid obesity and diet noncompliance. The patient has no interest in changing any of his habit search or to loose his weight.  2. HTN - BP ok on current regimen  3. HLD - refuses statin therapy sec to muscle/joint pain, on red yeast rice that he tolerates well.   4. Metabolic syndrome - multiple risk factors - unfortunately I do not think he has the insight to make the changes that he needs to make. He gets aggressive verbally when any intervention is suggested.   5. SOB - not worse than previously, there is significant calcification in all three coronary arteries, however SOB is chronic and unchanged, most probably sec to physical inactivity and obesity.We will perform a dedicated coronary CTA if his symptoms worsen.  Medication Adjustments/Labs and Tests Ordered: Current medicines are reviewed at length with the patient today.  Concerns regarding medicines are outlined above.   Signed, Ena Dawley, MD  11/01/2016 12:59 PM    [place]

## 2016-11-01 NOTE — Patient Instructions (Signed)

## 2016-11-02 NOTE — Addendum Note (Signed)
Addended by: Marlis Edelson C on: 11/02/2016 11:10 AM   Modules accepted: Orders

## 2016-11-20 DIAGNOSIS — E7849 Other hyperlipidemia: Secondary | ICD-10-CM | POA: Diagnosis not present

## 2016-11-20 DIAGNOSIS — Z125 Encounter for screening for malignant neoplasm of prostate: Secondary | ICD-10-CM | POA: Diagnosis not present

## 2016-11-20 DIAGNOSIS — M109 Gout, unspecified: Secondary | ICD-10-CM | POA: Diagnosis not present

## 2016-11-20 DIAGNOSIS — E785 Hyperlipidemia, unspecified: Secondary | ICD-10-CM | POA: Diagnosis not present

## 2016-11-20 DIAGNOSIS — E038 Other specified hypothyroidism: Secondary | ICD-10-CM | POA: Diagnosis not present

## 2016-11-20 DIAGNOSIS — E119 Type 2 diabetes mellitus without complications: Secondary | ICD-10-CM | POA: Diagnosis not present

## 2016-11-20 DIAGNOSIS — E559 Vitamin D deficiency, unspecified: Secondary | ICD-10-CM | POA: Diagnosis not present

## 2016-11-20 DIAGNOSIS — Z0000 Encounter for general adult medical examination without abnormal findings: Secondary | ICD-10-CM | POA: Diagnosis not present

## 2016-11-27 DIAGNOSIS — M5388 Other specified dorsopathies, sacral and sacrococcygeal region: Secondary | ICD-10-CM | POA: Diagnosis not present

## 2016-11-27 DIAGNOSIS — M5489 Other dorsalgia: Secondary | ICD-10-CM | POA: Diagnosis not present

## 2016-11-27 DIAGNOSIS — Q791 Other congenital malformations of diaphragm: Secondary | ICD-10-CM | POA: Diagnosis not present

## 2016-11-27 DIAGNOSIS — E7849 Other hyperlipidemia: Secondary | ICD-10-CM | POA: Diagnosis not present

## 2016-11-27 DIAGNOSIS — R600 Localized edema: Secondary | ICD-10-CM | POA: Diagnosis not present

## 2016-11-27 DIAGNOSIS — Z6841 Body Mass Index (BMI) 40.0 and over, adult: Secondary | ICD-10-CM | POA: Diagnosis not present

## 2016-11-27 DIAGNOSIS — Z1389 Encounter for screening for other disorder: Secondary | ICD-10-CM | POA: Diagnosis not present

## 2016-11-27 DIAGNOSIS — Z0000 Encounter for general adult medical examination without abnormal findings: Secondary | ICD-10-CM | POA: Diagnosis not present

## 2016-11-27 DIAGNOSIS — E038 Other specified hypothyroidism: Secondary | ICD-10-CM | POA: Diagnosis not present

## 2016-11-27 DIAGNOSIS — G4709 Other insomnia: Secondary | ICD-10-CM | POA: Diagnosis not present

## 2016-11-27 DIAGNOSIS — I5031 Acute diastolic (congestive) heart failure: Secondary | ICD-10-CM | POA: Diagnosis not present

## 2016-11-27 DIAGNOSIS — E119 Type 2 diabetes mellitus without complications: Secondary | ICD-10-CM | POA: Diagnosis not present

## 2017-01-12 DIAGNOSIS — M1712 Unilateral primary osteoarthritis, left knee: Secondary | ICD-10-CM | POA: Diagnosis not present

## 2017-01-19 DIAGNOSIS — M1712 Unilateral primary osteoarthritis, left knee: Secondary | ICD-10-CM | POA: Diagnosis not present

## 2017-01-31 DIAGNOSIS — M1712 Unilateral primary osteoarthritis, left knee: Secondary | ICD-10-CM | POA: Diagnosis not present

## 2017-03-15 DIAGNOSIS — L570 Actinic keratosis: Secondary | ICD-10-CM | POA: Diagnosis not present

## 2017-03-15 DIAGNOSIS — D2339 Other benign neoplasm of skin of other parts of face: Secondary | ICD-10-CM | POA: Diagnosis not present

## 2017-03-15 DIAGNOSIS — D485 Neoplasm of uncertain behavior of skin: Secondary | ICD-10-CM | POA: Diagnosis not present

## 2017-03-27 DIAGNOSIS — I10 Essential (primary) hypertension: Secondary | ICD-10-CM | POA: Diagnosis not present

## 2017-03-27 DIAGNOSIS — M5380 Other specified dorsopathies, site unspecified: Secondary | ICD-10-CM | POA: Diagnosis not present

## 2017-03-27 DIAGNOSIS — E038 Other specified hypothyroidism: Secondary | ICD-10-CM | POA: Diagnosis not present

## 2017-03-27 DIAGNOSIS — E119 Type 2 diabetes mellitus without complications: Secondary | ICD-10-CM | POA: Diagnosis not present

## 2017-03-27 DIAGNOSIS — Z6841 Body Mass Index (BMI) 40.0 and over, adult: Secondary | ICD-10-CM | POA: Diagnosis not present

## 2017-03-27 DIAGNOSIS — F4310 Post-traumatic stress disorder, unspecified: Secondary | ICD-10-CM | POA: Diagnosis not present

## 2017-04-02 DIAGNOSIS — F4310 Post-traumatic stress disorder, unspecified: Secondary | ICD-10-CM | POA: Diagnosis not present

## 2017-04-16 DIAGNOSIS — F4310 Post-traumatic stress disorder, unspecified: Secondary | ICD-10-CM | POA: Diagnosis not present

## 2017-05-01 DIAGNOSIS — F4310 Post-traumatic stress disorder, unspecified: Secondary | ICD-10-CM | POA: Diagnosis not present

## 2017-05-21 DIAGNOSIS — M19019 Primary osteoarthritis, unspecified shoulder: Secondary | ICD-10-CM | POA: Insufficient documentation

## 2017-05-25 DIAGNOSIS — M1712 Unilateral primary osteoarthritis, left knee: Secondary | ICD-10-CM | POA: Diagnosis not present

## 2017-06-05 DIAGNOSIS — F4310 Post-traumatic stress disorder, unspecified: Secondary | ICD-10-CM | POA: Diagnosis not present

## 2017-06-08 DIAGNOSIS — M1712 Unilateral primary osteoarthritis, left knee: Secondary | ICD-10-CM | POA: Diagnosis not present

## 2017-06-08 DIAGNOSIS — M25562 Pain in left knee: Secondary | ICD-10-CM | POA: Diagnosis not present

## 2017-06-19 DIAGNOSIS — F4310 Post-traumatic stress disorder, unspecified: Secondary | ICD-10-CM | POA: Diagnosis not present

## 2017-07-18 DIAGNOSIS — E1169 Type 2 diabetes mellitus with other specified complication: Secondary | ICD-10-CM | POA: Diagnosis not present

## 2017-07-18 DIAGNOSIS — M1990 Unspecified osteoarthritis, unspecified site: Secondary | ICD-10-CM | POA: Diagnosis not present

## 2017-07-18 DIAGNOSIS — I5031 Acute diastolic (congestive) heart failure: Secondary | ICD-10-CM | POA: Diagnosis not present

## 2017-07-18 DIAGNOSIS — Z6841 Body Mass Index (BMI) 40.0 and over, adult: Secondary | ICD-10-CM | POA: Diagnosis not present

## 2017-07-18 DIAGNOSIS — I10 Essential (primary) hypertension: Secondary | ICD-10-CM | POA: Diagnosis not present

## 2017-07-18 DIAGNOSIS — F4310 Post-traumatic stress disorder, unspecified: Secondary | ICD-10-CM | POA: Diagnosis not present

## 2017-07-18 DIAGNOSIS — J3089 Other allergic rhinitis: Secondary | ICD-10-CM | POA: Diagnosis not present

## 2017-09-18 DIAGNOSIS — M9903 Segmental and somatic dysfunction of lumbar region: Secondary | ICD-10-CM | POA: Diagnosis not present

## 2017-09-18 DIAGNOSIS — M2560 Stiffness of unspecified joint, not elsewhere classified: Secondary | ICD-10-CM | POA: Diagnosis not present

## 2017-09-18 DIAGNOSIS — R293 Abnormal posture: Secondary | ICD-10-CM | POA: Diagnosis not present

## 2017-09-18 DIAGNOSIS — M545 Low back pain: Secondary | ICD-10-CM | POA: Diagnosis not present

## 2017-09-24 NOTE — Pre-Procedure Instructions (Signed)
ALMOND FITZGIBBON  09/24/2017      [place], [place] [address] [place] [place] [address] Phone: [phone] Fax: [phone]    Your procedure is scheduled on October 04, 2017.  Report to [facility] Admitting at 530 AM.  Call this number if you have problems the morning of surgery:  [phone]   Remember:  Do not eat or drink after midnight.      Take these medicines the morning of surgery with A SIP OF WATER  Allopurinol (zyloprim) Cetirizine (zyrtec) Cyclobenzaprine (flexeril) Gabapentin (neurontin)-if needed Hydrocodone-acetaminophen-if needed for pain Levothyroxine (synthroid) Metoprolol (lopressor) Omeprazole (prilosec)   Follow your surgeon's instructions on when to hold/resume aspirin.  If no instructions were given call the office to determine how they would like to you take aspirin  7 days prior to surgery STOP taking any  Aleve, Naproxen, Ibuprofen, Motrin, Advil, Goody's, BC's, all herbal medications, fish oil, and all vitamins   WHAT DO I DO ABOUT MY DIABETES MEDICATION?  Marland Kitchen Do not take oral diabetes medicines (pills) the morning of surgery metformin (glucophage).  How to Manage Your Diabetes Before and After Surgery  Why is it important to control my blood sugar before and after surgery? . Improving blood sugar levels before and after surgery helps healing and can limit problems. . A way of improving blood sugar control is eating a healthy diet by: o  Eating less sugar and carbohydrates o  Increasing activity/exercise o  Talking with your doctor about reaching your blood sugar goals . High blood sugars (greater than 180 mg/dL) can raise your risk of infections and slow your recovery, so you will need to focus on controlling your diabetes during the weeks before surgery. . Make sure that the doctor who takes care of your diabetes knows about your planned surgery including the date and  location.  How do I manage my blood sugar before surgery? . Check your blood sugar at least 4 times a day, starting 2 days before surgery, to make sure that the level is not too high or low. o Check your blood sugar the morning of your surgery when you wake up and every 2 hours until you get to the Short Stay unit. . If your blood sugar is less than 70 mg/dL, you will need to treat for low blood sugar: o Do not take insulin. o Treat a low blood sugar (less than 70 mg/dL) with  cup of clear juice (cranberry or apple), 4 glucose tablets, OR glucose gel. Recheck blood sugar in 15 minutes after treatment (to make sure it is greater than 70 mg/dL). If your blood sugar is not greater than 70 mg/dL on recheck, call [phone] o  for further instructions. . Report your blood sugar to the short stay nurse when you get to Short Stay.  . If you are admitted to the hospital after surgery: o Your blood sugar will be checked by the staff and you will probably be given insulin after surgery (instead of oral diabetes medicines) to make sure you have good blood sugar levels. o The goal for blood sugar control after surgery is 80-180 mg/dL.    Do not wear jewelry  Do not wear lotions, powders, or colognes, or deodorant.   Men may shave face and neck.  Do not bring valuables to the hospital.  [facility] is not responsible for any belongings or valuables.  Contacts, dentures or bridgework may not  be worn into surgery.  Leave your suitcase in the car.  After surgery it may be brought to your room.  For patients admitted to the hospital, discharge time will be determined by your treatment team.  Patients discharged the day of surgery will not be allowed to drive home.    [facility]- Preparing For Surgery  Before surgery, you can play an important role. Because skin is not sterile, your skin needs to be as free of germs as possible. You can reduce the number of germs on your skin by washing with CHG  (chlorahexidine gluconate) Soap before surgery.  CHG is an antiseptic cleaner which kills germs and bonds with the skin to continue killing germs even after washing.    Oral Hygiene is also important to reduce your risk of infection.  Remember - BRUSH YOUR TEETH THE MORNING OF SURGERY WITH YOUR REGULAR TOOTHPASTE  Please do not use if you have an allergy to CHG or antibacterial soaps. If your skin becomes reddened/irritated stop using the CHG.  Do not shave (including legs and underarms) for at least 48 hours prior to first CHG shower. It is OK to shave your face.  Please follow these instructions carefully.   1. Shower the NIGHT BEFORE SURGERY and the MORNING OF SURGERY with CHG.   2. If you chose to wash your hair, wash your hair first as usual with your normal shampoo.  3. After you shampoo, rinse your hair and body thoroughly to remove the shampoo.  4. Use CHG as you would any other liquid soap. You can apply CHG directly to the skin and wash gently with a scrungie or a clean washcloth.   5. Apply the CHG Soap to your body ONLY FROM THE NECK DOWN.  Do not use on open wounds or open sores. Avoid contact with your eyes, ears, mouth and genitals (private parts). Wash Face and genitals (private parts)  with your normal soap.  6. Wash thoroughly, paying special attention to the area where your surgery will be performed.  7. Thoroughly rinse your body with warm water from the neck down.  8. DO NOT shower/wash with your normal soap after using and rinsing off the CHG Soap.  9. Pat yourself dry with a CLEAN TOWEL.  10. Wear CLEAN PAJAMAS to bed the night before surgery, wear comfortable clothes the morning of surgery  11. Place CLEAN SHEETS on your bed the night of your first shower and DO NOT SLEEP WITH PETS.  Day of Surgery:  Do not apply any deodorants/lotions.  Please wear clean clothes to the hospital/surgery center.   Remember to brush your teeth WITH YOUR REGULAR  TOOTHPASTE.  Please read over the following fact sheets that you were given. Pain Booklet, Coughing and Deep Breathing, MRSA Information and Surgical Site Infection Prevention

## 2017-09-25 ENCOUNTER — Encounter (HOSPITAL_COMMUNITY)
Admission: RE | Admit: 2017-09-25 | Discharge: 2017-09-25 | Disposition: A | Discharge status: Home / Self Care | Payer: PPO | Source: Ambulatory Visit | Attending: Orthopedic Surgery | Admitting: Orthopedic Surgery

## 2017-09-25 ENCOUNTER — Encounter (HOSPITAL_COMMUNITY): Payer: Self-pay

## 2017-09-25 ENCOUNTER — Other Ambulatory Visit: Payer: Self-pay

## 2017-09-25 DIAGNOSIS — Z01812 Encounter for preprocedural laboratory examination: Secondary | ICD-10-CM | POA: Insufficient documentation

## 2017-09-25 HISTORY — DX: Heart failure, unspecified: I50.9

## 2017-09-25 LAB — BASIC METABOLIC PANEL
Anion gap: 12 (ref 5–15)
BUN: 18 mg/dL (ref 8–23)
CO2: 25 mmol/L (ref 22–32)
Calcium: 9.1 mg/dL (ref 8.9–10.3)
Chloride: 99 mmol/L (ref 98–111)
Creatinine, Ser: 1.14 mg/dL (ref 0.61–1.24)
GFR calc Af Amer: >60 mL/min (ref >60)
GLUCOSE: 140 mg/dL — AB (ref 70–99)
POTASSIUM: 3.8 mmol/L (ref 3.5–5.1)
Sodium: 136 mmol/L (ref 135–145)

## 2017-09-25 LAB — CBC
HEMATOCRIT: 46.8 % (ref 39.0–52.0)
Hemoglobin: 15.2 g/dL (ref 13.0–17.0)
MCH: 30.8 pg (ref 26.0–34.0)
MCHC: 32.5 g/dL (ref 30.0–36.0)
MCV: 94.7 fL (ref 78.0–100.0)
Platelets: 209 10*3/uL (ref 150–400)
RBC: 4.94 MIL/uL (ref 4.22–5.81)
RDW: 14.3 % (ref 11.5–15.5)
WBC: 6.6 10*3/uL (ref 4.0–10.5)

## 2017-09-25 LAB — HEMOGLOBIN A1C
Hgb A1c MFr Bld: 6.3 % — ABNORMAL HIGH (ref 4.8–5.6)
MEAN PLASMA GLUCOSE: 134.11 mg/dL

## 2017-09-25 LAB — SURGICAL PCR SCREEN
MRSA, PCR: NEGATIVE
Staphylococcus aureus: NEGATIVE

## 2017-09-25 LAB — GLUCOSE, CAPILLARY: Glucose-Capillary: 186 mg/dL — ABNORMAL HIGH (ref 70–99)

## 2017-09-25 NOTE — Progress Notes (Signed)
PCP - Crist Infante, M.D. Cardiologist - K. Meda Coffee, M.D.  Chest x-ray - N/A EKG - 11/01/16 Stress Test - 01/04/14 ECHO - 03/07/15 Cardiac Cath - reports over 10+ years ago.  Sleep Study -Yes, negative study.   CPAP - denies  Fasting Blood Sugar - pt reports between 120-160 Checks Blood Sugar-about once a month.    Aspirin Instructions: Spoke with Margarita Grizzle at [employer]. Will consult with Dr. Onnie Graham regarding ASA and call patient personally with instructions. Left pt's number with Margarita Grizzle.   Anesthesia review: No  Patient denies shortness of breath, fever, cough and chest pain at PAT appointment   Patient verbalized understanding of instructions that were given to them at the PAT appointment. Patient was also instructed that they will need to review over the PAT instructions again at home before surgery.

## 2017-10-03 MED ORDER — DEXTROSE 5 % IV SOLN
3.0000 g | INTRAVENOUS | Status: AC
  Administered 2017-10-04: 3 g via INTRAVENOUS
  Filled 2017-10-03: qty 3

## 2017-10-03 MED ORDER — TRANEXAMIC ACID 1000 MG/10ML IV SOLN
1000.0000 mg | INTRAVENOUS | Status: AC
  Administered 2017-10-04: 1000 mg via INTRAVENOUS
  Filled 2017-10-03: qty 1100

## 2017-10-04 ENCOUNTER — Inpatient Hospital Stay (HOSPITAL_COMMUNITY): Payer: No Typology Code available for payment source

## 2017-10-04 ENCOUNTER — Inpatient Hospital Stay (HOSPITAL_COMMUNITY)
Admission: RE | Admit: 2017-10-04 | Discharge: 2017-10-05 | DRG: 483 | Disposition: A | Discharge status: Home / Self Care | Payer: No Typology Code available for payment source | Source: Ambulatory Visit | Attending: Orthopedic Surgery | Admitting: Orthopedic Surgery

## 2017-10-04 ENCOUNTER — Encounter (HOSPITAL_COMMUNITY)
Admission: RE | Disposition: A | Discharge status: Home / Self Care | Payer: Self-pay | Source: Ambulatory Visit | Attending: Orthopedic Surgery

## 2017-10-04 ENCOUNTER — Encounter (HOSPITAL_COMMUNITY): Payer: Self-pay

## 2017-10-04 DIAGNOSIS — Z87891 Personal history of nicotine dependence: Secondary | ICD-10-CM | POA: Diagnosis not present

## 2017-10-04 DIAGNOSIS — E119 Type 2 diabetes mellitus without complications: Secondary | ICD-10-CM | POA: Diagnosis present

## 2017-10-04 DIAGNOSIS — Z885 Allergy status to narcotic agent status: Secondary | ICD-10-CM | POA: Diagnosis not present

## 2017-10-04 DIAGNOSIS — Z888 Allergy status to other drugs, medicaments and biological substances status: Secondary | ICD-10-CM

## 2017-10-04 DIAGNOSIS — E785 Hyperlipidemia, unspecified: Secondary | ICD-10-CM | POA: Diagnosis not present

## 2017-10-04 DIAGNOSIS — M109 Gout, unspecified: Secondary | ICD-10-CM | POA: Diagnosis present

## 2017-10-04 DIAGNOSIS — Z96619 Presence of unspecified artificial shoulder joint: Secondary | ICD-10-CM

## 2017-10-04 DIAGNOSIS — Z7982 Long term (current) use of aspirin: Secondary | ICD-10-CM

## 2017-10-04 DIAGNOSIS — Z85828 Personal history of other malignant neoplasm of skin: Secondary | ICD-10-CM

## 2017-10-04 DIAGNOSIS — M19012 Primary osteoarthritis, left shoulder: Secondary | ICD-10-CM | POA: Diagnosis not present

## 2017-10-04 DIAGNOSIS — Z7984 Long term (current) use of oral hypoglycemic drugs: Secondary | ICD-10-CM | POA: Diagnosis not present

## 2017-10-04 DIAGNOSIS — K219 Gastro-esophageal reflux disease without esophagitis: Secondary | ICD-10-CM | POA: Diagnosis present

## 2017-10-04 DIAGNOSIS — Z79899 Other long term (current) drug therapy: Secondary | ICD-10-CM

## 2017-10-04 DIAGNOSIS — I10 Essential (primary) hypertension: Secondary | ICD-10-CM | POA: Diagnosis present

## 2017-10-04 DIAGNOSIS — Z96651 Presence of right artificial knee joint: Secondary | ICD-10-CM | POA: Diagnosis not present

## 2017-10-04 DIAGNOSIS — Z882 Allergy status to sulfonamides status: Secondary | ICD-10-CM

## 2017-10-04 HISTORY — PX: TOTAL SHOULDER ARTHROPLASTY: SHX126

## 2017-10-04 LAB — GLUCOSE, CAPILLARY
Glucose-Capillary: 158 mg/dL — ABNORMAL HIGH (ref 70–99)
Glucose-Capillary: 121 mg/dL — ABNORMAL HIGH (ref 70–99)
Glucose-Capillary: 140 mg/dL — ABNORMAL HIGH (ref 70–99)
Glucose-Capillary: 190 mg/dL — ABNORMAL HIGH (ref 70–99)
Glucose-Capillary: 102 mg/dL — ABNORMAL HIGH (ref 70–99)

## 2017-10-04 SURGERY — ARTHROPLASTY, SHOULDER, TOTAL
Anesthesia: Regional | Site: Shoulder | Laterality: Left

## 2017-10-04 MED ORDER — METOCLOPRAMIDE HCL 5 MG/ML IJ SOLN: 5.0000 mg | Freq: Three times a day (TID) | INTRAMUSCULAR | Status: DC | PRN

## 2017-10-04 MED ORDER — CHLORHEXIDINE GLUCONATE 4 % EX LIQD: 60.0000 mL | Freq: Once | CUTANEOUS | Status: DC

## 2017-10-04 MED ORDER — LACTATED RINGERS IV SOLN
INTRAVENOUS | Status: DC
  Administered 2017-10-04: 14:00:00 via INTRAVENOUS

## 2017-10-04 MED ORDER — METOCLOPRAMIDE HCL 5 MG PO TABS: 5.0000 mg | ORAL_TABLET | Freq: Three times a day (TID) | ORAL | Status: DC | PRN

## 2017-10-04 MED ORDER — OXYCODONE HCL 5 MG PO TABS: 5.0000 mg | ORAL_TABLET | Freq: Once | ORAL | Status: DC | PRN

## 2017-10-04 MED ORDER — METHOCARBAMOL 1000 MG/10ML IJ SOLN
500.0000 mg | Freq: Four times a day (QID) | INTRAVENOUS | Status: DC | PRN
  Filled 2017-10-04: qty 5

## 2017-10-04 MED ORDER — METOPROLOL TARTRATE 50 MG PO TABS
50.0000 mg | ORAL_TABLET | Freq: Two times a day (BID) | ORAL | Status: DC
  Administered 2017-10-04 – 2017-10-05 (×2): 50 mg via ORAL
  Filled 2017-10-04 (×2): qty 1

## 2017-10-04 MED ORDER — MIDAZOLAM HCL 2 MG/2ML IJ SOLN
INTRAMUSCULAR | Status: AC
  Filled 2017-10-04: qty 2

## 2017-10-04 MED ORDER — FUROSEMIDE 40 MG PO TABS
40.0000 mg | ORAL_TABLET | Freq: Two times a day (BID) | ORAL | Status: DC
  Administered 2017-10-04 – 2017-10-05 (×2): 40 mg via ORAL
  Filled 2017-10-04 (×2): qty 1

## 2017-10-04 MED ORDER — ONDANSETRON HCL 4 MG PO TABS: 4.0000 mg | ORAL_TABLET | Freq: Four times a day (QID) | ORAL | Status: DC | PRN

## 2017-10-04 MED ORDER — ARTIFICIAL TEARS OPHTHALMIC OINT
TOPICAL_OINTMENT | OPHTHALMIC | Status: DC | PRN
  Administered 2017-10-04: 1 via OPHTHALMIC

## 2017-10-04 MED ORDER — 0.9 % SODIUM CHLORIDE (POUR BTL) OPTIME
TOPICAL | Status: DC | PRN
  Administered 2017-10-04: 1000 mL

## 2017-10-04 MED ORDER — FLEET ENEMA 7-19 GM/118ML RE ENEM: 1.0000 | ENEMA | Freq: Once | RECTAL | Status: DC | PRN

## 2017-10-04 MED ORDER — DIPHENHYDRAMINE HCL 12.5 MG/5ML PO ELIX: 12.5000 mg | ORAL_SOLUTION | ORAL | Status: DC | PRN

## 2017-10-04 MED ORDER — PHENOL 1.4 % MT LIQD: 1.0000 | OROMUCOSAL | Status: DC | PRN

## 2017-10-04 MED ORDER — DOCUSATE SODIUM 100 MG PO CAPS
100.0000 mg | ORAL_CAPSULE | Freq: Two times a day (BID) | ORAL | Status: DC
  Administered 2017-10-04 – 2017-10-05 (×2): 100 mg via ORAL
  Filled 2017-10-04 (×2): qty 1

## 2017-10-04 MED ORDER — PROPOFOL 10 MG/ML IV BOLUS
INTRAVENOUS | Status: AC
  Filled 2017-10-04: qty 20

## 2017-10-04 MED ORDER — HYDROMORPHONE HCL 1 MG/ML IJ SOLN: 0.2500 mg | INTRAMUSCULAR | Status: DC | PRN

## 2017-10-04 MED ORDER — ONDANSETRON HCL 4 MG/2ML IJ SOLN: 4.0000 mg | Freq: Four times a day (QID) | INTRAMUSCULAR | Status: DC | PRN

## 2017-10-04 MED ORDER — LIDOCAINE 2% (20 MG/ML) 5 ML SYRINGE
INTRAMUSCULAR | Status: AC
  Filled 2017-10-04: qty 5

## 2017-10-04 MED ORDER — OXYCODONE HCL 5 MG PO TABS
5.0000 mg | ORAL_TABLET | ORAL | Status: DC | PRN
  Administered 2017-10-04 – 2017-10-05 (×4): 10 mg via ORAL
  Filled 2017-10-04: qty 1
  Filled 2017-10-04 (×4): qty 2

## 2017-10-04 MED ORDER — DOXAZOSIN MESYLATE 4 MG PO TABS
4.0000 mg | ORAL_TABLET | Freq: Every day | ORAL | Status: DC
  Administered 2017-10-04: 4 mg via ORAL
  Filled 2017-10-04: qty 1

## 2017-10-04 MED ORDER — TEMAZEPAM 15 MG PO CAPS
15.0000 mg | ORAL_CAPSULE | Freq: Every evening | ORAL | Status: DC | PRN
  Administered 2017-10-04: 15 mg via ORAL
  Filled 2017-10-04: qty 1

## 2017-10-04 MED ORDER — METHOCARBAMOL 500 MG PO TABS: 500.0000 mg | ORAL_TABLET | Freq: Four times a day (QID) | ORAL | Status: DC | PRN

## 2017-10-04 MED ORDER — LIDOCAINE 2% (20 MG/ML) 5 ML SYRINGE
INTRAMUSCULAR | Status: DC | PRN
  Administered 2017-10-04: 100 mg via INTRAVENOUS

## 2017-10-04 MED ORDER — LEVOTHYROXINE SODIUM 50 MCG PO TABS
50.0000 ug | ORAL_TABLET | Freq: Every day | ORAL | Status: DC
  Administered 2017-10-05: 50 ug via ORAL
  Filled 2017-10-04: qty 1

## 2017-10-04 MED ORDER — ALUM & MAG HYDROXIDE-SIMETH 200-200-20 MG/5ML PO SUSP: 30.0000 mL | ORAL | Status: DC | PRN

## 2017-10-04 MED ORDER — PROMETHAZINE HCL 25 MG/ML IJ SOLN: 6.2500 mg | INTRAMUSCULAR | Status: DC | PRN

## 2017-10-04 MED ORDER — ONDANSETRON HCL 4 MG/2ML IJ SOLN
INTRAMUSCULAR | Status: AC
  Filled 2017-10-04: qty 2

## 2017-10-04 MED ORDER — INSULIN ASPART 100 UNIT/ML ~~LOC~~ SOLN
0.0000 [IU] | Freq: Three times a day (TID) | SUBCUTANEOUS | Status: DC
  Administered 2017-10-04: 3 [IU] via SUBCUTANEOUS

## 2017-10-04 MED ORDER — BUPIVACAINE LIPOSOME 1.3 % IJ SUSP
INTRAMUSCULAR | Status: DC | PRN
  Administered 2017-10-04: 10 mL

## 2017-10-04 MED ORDER — ACETAMINOPHEN 325 MG PO TABS: 325.0000 mg | ORAL_TABLET | Freq: Four times a day (QID) | ORAL | Status: DC | PRN

## 2017-10-04 MED ORDER — ONDANSETRON HCL 4 MG/2ML IJ SOLN
INTRAMUSCULAR | Status: DC | PRN
  Administered 2017-10-04: 4 mg via INTRAVENOUS

## 2017-10-04 MED ORDER — FENTANYL CITRATE (PF) 250 MCG/5ML IJ SOLN
INTRAMUSCULAR | Status: AC
  Filled 2017-10-04: qty 5

## 2017-10-04 MED ORDER — ASPIRIN EC 81 MG PO TBEC
81.0000 mg | DELAYED_RELEASE_TABLET | Freq: Every day | ORAL | Status: DC
  Administered 2017-10-04 – 2017-10-05 (×2): 81 mg via ORAL
  Filled 2017-10-04 (×2): qty 1

## 2017-10-04 MED ORDER — ALLOPURINOL 300 MG PO TABS
300.0000 mg | ORAL_TABLET | Freq: Every day | ORAL | Status: DC
  Administered 2017-10-04 – 2017-10-05 (×2): 300 mg via ORAL
  Filled 2017-10-04 (×2): qty 1

## 2017-10-04 MED ORDER — MENTHOL 3 MG MT LOZG: 1.0000 | LOZENGE | OROMUCOSAL | Status: DC | PRN

## 2017-10-04 MED ORDER — PANTOPRAZOLE SODIUM 40 MG PO TBEC
40.0000 mg | DELAYED_RELEASE_TABLET | Freq: Every day | ORAL | Status: DC
  Administered 2017-10-04 – 2017-10-05 (×2): 40 mg via ORAL
  Filled 2017-10-04 (×2): qty 1

## 2017-10-04 MED ORDER — POLYETHYLENE GLYCOL 3350 17 G PO PACK: 17.0000 g | PACK | Freq: Every day | ORAL | Status: DC | PRN

## 2017-10-04 MED ORDER — SODIUM CHLORIDE 0.9 % IV SOLN
INTRAVENOUS | Status: DC | PRN
  Administered 2017-10-04: 20 ug/min via INTRAVENOUS

## 2017-10-04 MED ORDER — BUPIVACAINE HCL (PF) 0.5 % IJ SOLN
INTRAMUSCULAR | Status: DC | PRN
  Administered 2017-10-04: 20 mL via PERINEURAL

## 2017-10-04 MED ORDER — GABAPENTIN 300 MG PO CAPS: 300.0000 mg | ORAL_CAPSULE | Freq: Every day | ORAL | Status: DC | PRN

## 2017-10-04 MED ORDER — KETOROLAC TROMETHAMINE 15 MG/ML IJ SOLN
7.5000 mg | Freq: Four times a day (QID) | INTRAMUSCULAR | Status: AC
  Administered 2017-10-04 – 2017-10-05 (×4): 7.5 mg via INTRAVENOUS
  Filled 2017-10-04 (×4): qty 1

## 2017-10-04 MED ORDER — LACTATED RINGERS IV SOLN
INTRAVENOUS | Status: DC | PRN
  Administered 2017-10-04: 07:00:00 via INTRAVENOUS

## 2017-10-04 MED ORDER — PROPOFOL 10 MG/ML IV BOLUS
INTRAVENOUS | Status: DC | PRN
  Administered 2017-10-04: 200 mg via INTRAVENOUS

## 2017-10-04 MED ORDER — HYDROMORPHONE HCL 1 MG/ML IJ SOLN
0.5000 mg | INTRAMUSCULAR | Status: DC | PRN
  Filled 2017-10-04: qty 1

## 2017-10-04 MED ORDER — OXYCODONE HCL 5 MG/5ML PO SOLN: 5.0000 mg | Freq: Once | ORAL | Status: DC | PRN

## 2017-10-04 MED ORDER — OXYCODONE HCL 5 MG PO TABS: 10.0000 mg | ORAL_TABLET | ORAL | Status: DC | PRN

## 2017-10-04 MED ORDER — FENTANYL CITRATE (PF) 100 MCG/2ML IJ SOLN
INTRAMUSCULAR | Status: DC | PRN
  Administered 2017-10-04: 25 ug via INTRAVENOUS
  Administered 2017-10-04 (×2): 50 ug via INTRAVENOUS

## 2017-10-04 MED ORDER — MIDAZOLAM HCL 5 MG/5ML IJ SOLN
INTRAMUSCULAR | Status: DC | PRN
  Administered 2017-10-04: 1 mg via INTRAVENOUS

## 2017-10-04 MED ORDER — BISACODYL 5 MG PO TBEC: 5.0000 mg | DELAYED_RELEASE_TABLET | Freq: Every day | ORAL | Status: DC | PRN

## 2017-10-04 MED ORDER — MIRTAZAPINE 15 MG PO TABS
15.0000 mg | ORAL_TABLET | Freq: Every day | ORAL | Status: DC
  Administered 2017-10-04: 15 mg via ORAL
  Filled 2017-10-04: qty 1

## 2017-10-04 SURGICAL SUPPLY — 61 items
ADH SKN CLS LQ APL DERMABOND (GAUZE/BANDAGES/DRESSINGS) ×1
AID PSTN UNV HD RSTRNT DISP (MISCELLANEOUS) ×1
BIT DRILL 5/64X5 DISP (BIT) ×2 IMPLANT
BLADE SAW SGTL 83.5X18.5 (BLADE) ×2 IMPLANT
CEMENT BONE DEPUY (Cement) ×2 IMPLANT
COVER SURGICAL LIGHT HANDLE (MISCELLANEOUS) ×2 IMPLANT
DERMABOND ADHESIVE PROPEN (GAUZE/BANDAGES/DRESSINGS) ×1
DERMABOND ADVANCED .7 DNX6 (GAUZE/BANDAGES/DRESSINGS) ×1 IMPLANT
DRAPE ORTHO SPLIT 77X108 STRL (DRAPES) ×4
DRAPE SURG 17X11 SM STRL (DRAPES) ×2 IMPLANT
DRAPE SURG ORHT 6 SPLT 77X108 (DRAPES) ×2 IMPLANT
DRAPE U-SHAPE 47X51 STRL (DRAPES) ×2 IMPLANT
DRSG AQUACEL AG ADV 3.5X10 (GAUZE/BANDAGES/DRESSINGS) ×2 IMPLANT
DURAPREP 26ML APPLICATOR (WOUND CARE) ×2 IMPLANT
ELECT BLADE 4.0 EZ CLEAN MEGAD (MISCELLANEOUS) ×2
ELECT CAUTERY BLADE 6.4 (BLADE) ×2 IMPLANT
ELECT REM PT RETURN 9FT ADLT (ELECTROSURGICAL) ×2
ELECTRODE BLDE 4.0 EZ CLN MEGD (MISCELLANEOUS) ×1 IMPLANT
ELECTRODE REM PT RTRN 9FT ADLT (ELECTROSURGICAL) ×1 IMPLANT
FACESHIELD WRAPAROUND (MASK) ×6 IMPLANT
FACESHIELD WRAPAROUND OR TEAM (MASK) ×3 IMPLANT
GLENOID UNI VAULTLOCK LRG (Shoulder) ×1 IMPLANT
GLOVE BIO SURGEON STRL SZ7 (GLOVE) ×1 IMPLANT
GLOVE BIO SURGEON STRL SZ7.5 (GLOVE) ×2 IMPLANT
GLOVE BIO SURGEON STRL SZ8 (GLOVE) ×2 IMPLANT
GLOVE EUDERMIC 7 POWDERFREE (GLOVE) ×2 IMPLANT
GLOVE SS BIOGEL STRL SZ 7.5 (GLOVE) ×1 IMPLANT
GLOVE SUPERSENSE BIOGEL SZ 7.5 (GLOVE) ×1
GOWN STRL REUS W/ TWL LRG LVL3 (GOWN DISPOSABLE) ×1 IMPLANT
GOWN STRL REUS W/ TWL XL LVL3 (GOWN DISPOSABLE) ×2 IMPLANT
GOWN STRL REUS W/TWL LRG LVL3 (GOWN DISPOSABLE) ×6
GOWN STRL REUS W/TWL XL LVL3 (GOWN DISPOSABLE) ×4
HEAD HUMERAL 19X50 UNIVERS (Joint) ×1 IMPLANT
KIT BASIN OR (CUSTOM PROCEDURE TRAY) ×2 IMPLANT
KIT SET UNIVERSAL (KITS) ×1 IMPLANT
KIT TURNOVER KIT B (KITS) ×2 IMPLANT
MANIFOLD NEPTUNE II (INSTRUMENTS) ×2 IMPLANT
NDL TAPERED W/ NITINOL LOOP (MISCELLANEOUS) ×1 IMPLANT
NEEDLE TAPERED W/ NITINOL LOOP (MISCELLANEOUS) ×2 IMPLANT
NS IRRIG 1000ML POUR BTL (IV SOLUTION) ×2 IMPLANT
PACK SHOULDER (CUSTOM PROCEDURE TRAY) ×2 IMPLANT
PAD ARMBOARD 7.5X6 YLW CONV (MISCELLANEOUS) ×5 IMPLANT
RESTRAINT HEAD UNIVERSAL NS (MISCELLANEOUS) ×2 IMPLANT
SLING ARM FOAM STRAP XLG (SOFTGOODS) ×1 IMPLANT
SLING ARM XL FOAM STRAP (SOFTGOODS) ×2 IMPLANT
SMARTMIX MINI TOWER (MISCELLANEOUS) ×2
SPONGE LAP 18X18 X RAY DECT (DISPOSABLE) ×2 IMPLANT
SPONGE LAP 4X18 RFD (DISPOSABLE) ×2 IMPLANT
STEM HUMERAL UNIV APEX 13MM (Stem) ×1 IMPLANT
SUCTION FRAZIER HANDLE 10FR (MISCELLANEOUS) ×1
SUCTION TUBE FRAZIER 10FR DISP (MISCELLANEOUS) ×1 IMPLANT
SUT FIBERWIRE #2 38 REV NDL BL (SUTURE) ×4
SUT MNCRL AB 3-0 PS2 18 (SUTURE) ×2 IMPLANT
SUT MON AB 2-0 CT1 36 (SUTURE) ×2 IMPLANT
SUT VIC AB 1 CT1 27 (SUTURE) ×6
SUT VIC AB 1 CT1 27XBRD ANBCTR (SUTURE) ×3 IMPLANT
SUTURE FIBERWR#2 38 REV NDL BL (SUTURE) ×1 IMPLANT
SUTURE TAPE 1.3 40 TPR END (SUTURE) ×3 IMPLANT
SUTURETAPE 1.3 40 TPR END (SUTURE) ×4
TOWEL OR 17X26 10 PK STRL BLUE (TOWEL DISPOSABLE) ×2 IMPLANT
TOWER SMARTMIX MINI (MISCELLANEOUS) ×1 IMPLANT

## 2017-10-04 NOTE — Discharge Instructions (Signed)
° °Kevin M. Supple, M.D., F.A.A.O.S. °Orthopaedic Surgery °Specializing in Arthroscopic and Reconstructive °Surgery of the Shoulder and Knee °[phone] °[address] - [place], [place] [address] - Fax [phone] ° ° °POST-OP TOTAL SHOULDER REPLACEMENT INSTRUCTIONS ° °1. Call the office at [phone] to schedule your first post-op appointment 10-14 days from the date of your surgery. ° °2. The bandage over your incision is waterproof. You may begin showering with this dressing on. You may leave this dressing on until first follow up appointment within 2 weeks. We prefer you leave this dressing in place until follow up however after 5-7 days if you are having itching or skin irritation and would like to remove it you may do so. Go slow and tug at the borders gently to break the bond the dressing has with the skin. At this point if there is no drainage it is okay to go without a bandage or you may cover it with a light guaze and tape. You can also expect significant bruising around your shoulder that will drift down your arm and into your chest wall. This is very normal and should resolve over several days. ° ° 3. Wear your sling/immobilizer at all times except to perform the exercises below or to occasionally let your arm dangle by your side to stretch your elbow. You also need to sleep in your sling immobilizer until instructed otherwise. ° °4. Range of motion to your elbow, wrist, and hand are encouraged 3-5 times daily. Exercise to your hand and fingers helps to reduce swelling you may experience. ° °5. Utilize ice to the shoulder 3-5 times minimum a day and additionally if you are experiencing pain. ° °6. Prescriptions for a pain medication and a muscle relaxant are provided for you. It is recommended that if you are experiencing pain that you pain medication alone is not controlling, add the muscle relaxant along with the pain medication which can give additional pain relief. The first 1-2 days  is generally the most severe of your pain and then should gradually decrease. As your pain lessens it is recommended that you decrease your use of the pain medications to an "as needed basis'" only and to always comply with the recommended dosages of the pain medications. ° °7. Pain medications can produce constipation along with their use. If you experience this, the use of an over the counter stool softener or laxative daily is recommended.  ° °8. For additional questions or concerns, please do not hesitate to call the office. If after hours there is an answering service to forward your concerns to the physician on call. ° °9.Pain control following an exparel block ° °To help control your post-operative pain you received a nerve block  performed with Exparel which is a long acting anesthetic (numbing agent) which can provide pain relief and sensations of numbness (and relief of pain) in the operative shoulder and arm for up to 3 days. Sometimes it provides mixed relief, meaning you may still have numbness in certain areas of the arm but can still be able to move  parts of that arm, hand, and fingers. We recommend that your prescribed pain medications  be used as needed. We do not feel it is necessary to "pre medicate" and "stay ahead" of pain.  Taking narcotic pain medications when you are not having any pain can lead to unnecessary and potentially dangerous side effects.  ° °POST-OP EXERCISES ° °Pendulum Exercises ° °Perform pendulum exercises while standing and bending at   the waist. Support your uninvolved arm on a table or chair and allow your operated arm to hang freely. Make sure to do these exercises passively - not using you shoulder muscles. ° °Repeat 20 times. Do 3 sessions per day. ° ° ° ° °

## 2017-10-04 NOTE — Transfer of Care (Signed)
Immediate Anesthesia Transfer of Care Note  Patient: Terry Lyons  Procedure(s) Performed: LEFT TOTAL SHOULDER ARTHROPLASTY (Left Shoulder)  Patient Location: PACU  Anesthesia Type:General and Regional  Level of Consciousness: awake, patient cooperative and responds to stimulation  Airway & Oxygen Therapy: Patient Spontanous Breathing and Patient connected to face mask oxygen  Post-op Assessment: Report given to RN and Post -op Vital signs reviewed and stable  Post vital signs: Reviewed and stable  Last Vitals:  Vitals Value Taken Time  BP 116/73 10/04/2017  9:51 AM  Temp    Pulse 86 10/04/2017  9:53 AM  Resp 18 10/04/2017  9:53 AM  SpO2 95 % 10/04/2017  9:53 AM  Vitals shown include unvalidated device data.  Last Pain:  Vitals:   10/04/17 0630  TempSrc:   PainSc: 0-No pain         Complications: No apparent anesthesia complications

## 2017-10-04 NOTE — Anesthesia Procedure Notes (Signed)
Anesthesia Regional Block: Interscalene brachial plexus block   Pre-Anesthetic Checklist: ,, timeout performed, Correct Patient, Correct Site, Correct Laterality, Correct Procedure, Correct Position, site marked, Risks and benefits discussed,  Surgical consent,  Pre-op evaluation,  At surgeon's request and post-op pain management  Laterality: Left  Prep: chloraprep       Needles:  Injection technique: Single-shot  Needle Type: Stimiplex     Needle Length: 9cm  Needle Gauge: 21     Additional Needles:   Procedures:,,,, ultrasound used (permanent image in chart),,,,  Narrative:  Start time: 10/04/2017 7:18 AM End time: 10/04/2017 7:23 AM Injection made incrementally with aspirations every 5 mL.  Performed by: Personally  Anesthesiologist: Lynda Rainwater, MD

## 2017-10-04 NOTE — Anesthesia Postprocedure Evaluation (Signed)
Anesthesia Post Note  Patient: Terry Lyons  Procedure(s) Performed: LEFT TOTAL SHOULDER ARTHROPLASTY (Left Shoulder)     Patient location during evaluation: PACU Anesthesia Type: Regional and General Level of consciousness: awake and alert Pain management: pain level controlled Vital Signs Assessment: post-procedure vital signs reviewed and stable Respiratory status: spontaneous breathing, nonlabored ventilation and respiratory function stable Cardiovascular status: blood pressure returned to baseline and stable Postop Assessment: no apparent nausea or vomiting Anesthetic complications: no    Last Vitals:  Vitals:   10/04/17 1030 10/04/17 1042  BP:  123/68  Pulse: 84 85  Resp: 16 16  Temp: 36.6 C (!) 36.4 C  SpO2: 94% 93%    Last Pain:  Vitals:   10/04/17 1119  TempSrc:   PainSc: 0-No pain                 Lynda Rainwater

## 2017-10-04 NOTE — H&P (Signed)
Terry Lyons    Chief Complaint: left shoulder osteoarthritis HPI: The patient is a 72 y.o. male with end stage left shoulder OA  Past Medical History:  Diagnosis Date  . Allergy   . Arthritis   . CHF (congestive heart failure) ([place])    hospitalized in 2017; was diagnosed with an acute excacerbation.   . Colon polyps    adenomatous  . Diabetes mellitus   . GERD (gastroesophageal reflux disease)   . Gout   . History of basal cell carcinoma    [facility]  . History of squamous cell carcinoma   . Hyperlipidemia   . Hypertension   . Obesity   . Rosacea   . Tachycardia     Past Surgical History:  Procedure Laterality Date  . colonoscopy with polypectomy     X3  . ESOPHAGOGASTRODUODENOSCOPY N/A 03/10/2015   Procedure: ESOPHAGOGASTRODUODENOSCOPY (EGD);  Surgeon: Ladene Artist, MD;  Location: [facility];  Service: Endoscopy;  Laterality: N/A;  . esophagus dilated     X3  . EUS N/A 05/20/2015   Procedure: UPPER ENDOSCOPIC ULTRASOUND (EUS) RADIAL;  Surgeon: Milus Banister, MD;  Location: [facility];  Service: Endoscopy;  Laterality: N/A;  . KNEE ARTHROSCOPY     R  . KNEE ARTHROSCOPY     bilaterally  . PILONIDAL CYST EXCISION    . SHOULDER SURGERY     right shoulder  . TONSILLECTOMY    . total ethmoidectomy & sphenoidectomiy Bilateral 05/27/13   also reduction of turbinates  . TOTAL KNEE ARTHROPLASTY     RIGHT    Family History  Problem Relation Age of Onset  . Lung cancer Mother        smoker  . Heart disease Father        Rheumatic Heart Disease  . Heart attack Brother 54       CBAG  . Lung cancer Maternal Aunt        smoker  . Heart attack Paternal Grandmother 53  . Heart attack Paternal Grandfather 61  . Diabetes Neg Hx   . Stroke Neg Hx   . Colon cancer Neg Hx   . Pancreatic cancer Neg Hx   . Rectal cancer Neg Hx   . Stomach cancer Neg Hx     Social History:  reports that he quit smoking about 43 years ago. His smoking use included cigarettes. He has  never used smokeless tobacco. He reports that he does not drink alcohol or use drugs.   Medications Prior to Admission  Medication Sig Dispense Refill  . allopurinol (ZYLOPRIM) 300 MG tablet Take 300 mg by mouth daily.     Marland Kitchen aspirin EC 81 MG tablet Take 81 mg by mouth daily.    . cetirizine (ZYRTEC) 10 MG tablet Take 10 mg by mouth daily.    . Cholecalciferol (VITAMIN D PO) Take 5,000 Units by mouth daily.     . Coenzyme Q10 (CO Q 10 PO) Take 1 capsule by mouth daily.     . cyclobenzaprine (FLEXERIL) 10 MG tablet Take 10 mg by mouth 3 (three) times daily as needed for muscle spasms.    Marland Kitchen doxazosin (CARDURA) 4 MG tablet Take 1 tablet (4 mg total) by mouth at bedtime. 90 tablet 1  . furosemide (LASIX) 40 MG tablet Take 1 tablet (40 mg total) by mouth 2 (two) times daily. 180 tablet 2  . gabapentin (NEURONTIN) 300 MG capsule Take 300 mg by mouth daily as needed (pain).     Marland Kitchen  HYDROcodone-acetaminophen (NORCO) 7.5-325 MG tablet Take 1 tablet by mouth every 4 (four) hours as needed. Pain.    Marland Kitchen KETOCONAZOLE, TOPICAL, 1 % SHAM Apply 1 application topically See admin instructions. Every 2-3 days when he washes hair.     Marland Kitchen levothyroxine (SYNTHROID, LEVOTHROID) 50 MCG tablet Take 50 mcg by mouth daily before breakfast.    . Menthol-Methyl Salicylate (THERA-GESIC EX) Apply 1 application topically daily as needed (knee pain.).    Marland Kitchen metFORMIN (GLUCOPHAGE) 500 MG tablet Take 500 mg by mouth 2 (two) times daily with a meal.    . metoprolol (LOPRESSOR) 50 MG tablet Take 50 mg by mouth 2 (two) times daily.    . mirtazapine (REMERON) 15 MG tablet Take 15 mg by mouth at bedtime.  11  . Multiple Vitamin (MULTIVITAMIN WITH MINERALS) TABS tablet Take 1 tablet by mouth daily. PACKET    . Omega-3 Fatty Acids (FISH OIL) 1000 MG CAPS Take 1 capsule by mouth daily.     Marland Kitchen omeprazole (PRILOSEC) 20 MG capsule Take 20 mg by mouth daily.     . potassium chloride (K-DUR) 10 MEQ tablet Take 10 mEq by mouth daily.    . Red  Yeast Rice Extract (RED YEAST RICE PO) Take 1 capsule by mouth at bedtime.     . Saw Palmetto, Serenoa repens, (SAW PALMETTO PO) Take 1 capsule by mouth daily.    . temazepam (RESTORIL) 15 MG capsule Take 15 mg by mouth at bedtime as needed. Sleep    . triamcinolone cream (KENALOG) 0.1 % Apply 1 application topically daily as needed (rosacea).     Marland Kitchen acetaminophen (TYLENOL) 325 MG tablet Take 650 mg by mouth every 6 (six) hours as needed for moderate pain.       Physical Exam: left shoulder with painful and restricted motion as noted at recent office visits  Vitals     Assessment/Plan  Impression: left shoulder osteoarthritis  Plan of Action: Procedure(s): LEFT TOTAL SHOULDER ARTHROPLASTY  Terry Lyons 10/04/2017, 6:06 AM Contact # [phone]

## 2017-10-04 NOTE — Anesthesia Procedure Notes (Signed)
Procedure Name: LMA Insertion Date/Time: 10/04/2017 8:40 AM Performed by: Verdie Drown, CRNA Patient Re-evaluated:Patient Re-evaluated prior to induction Oxygen Delivery Method: Circle system utilized Preoxygenation: Pre-oxygenation with 100% oxygen Induction Type: IV induction Ventilation: Oral airway inserted - appropriate to patient size LMA: LMA inserted LMA Size: 5.0 Placement Confirmation: positive ETCO2,  CO2 detector and breath sounds checked- equal and bilateral Tube secured with: Tape Dental Injury: Teeth and Oropharynx as per pre-operative assessment

## 2017-10-04 NOTE — Anesthesia Preprocedure Evaluation (Signed)
Anesthesia Evaluation  Patient identified by MRN, date of birth, ID band Patient awake    Reviewed: Allergy & Precautions, NPO status , Patient's Chart, lab work & pertinent test results, reviewed documented beta blocker date and time   Airway Mallampati: II   Neck ROM: full    Dental   Pulmonary former smoker,    breath sounds clear to auscultation       Cardiovascular hypertension, Pt. on medications and Pt. on home beta blockers +CHF   Rhythm:regular Rate:Normal  12/2013 Nuclear stress test: LV Ejection Fraction: 58%. LV Wall Motion: NL LV Function; NL Wall Motion    Neuro/Psych negative neurological ROS     GI/Hepatic Neg liver ROS, GERD  ,  Endo/Other  diabetes, Type 2, Oral Hypoglycemic AgentsHypothyroidism   Renal/GU Renal InsufficiencyRenal disease     Musculoskeletal  (+) Arthritis ,   Abdominal   Peds  Hematology  (+) anemia ,   Anesthesia Other Findings   Reproductive/Obstetrics                             Lab Results  Component Value Date   WBC 6.6 09/25/2017   HGB 15.2 09/25/2017   HCT 46.8 09/25/2017   MCV 94.7 09/25/2017   PLT 209 09/25/2017   Lab Results  Component Value Date   CREATININE 1.14 09/25/2017   BUN 18 09/25/2017   NA 136 09/25/2017   K 3.8 09/25/2017   CL 99 09/25/2017   CO2 25 09/25/2017    Anesthesia Physical  Anesthesia Plan  ASA: III  Anesthesia Plan: General and Regional   Post-op Pain Management:  Regional for Post-op pain   Induction: Intravenous  PONV Risk Score and Plan: 2 and Ondansetron, Midazolam and Treatment may vary due to age or medical condition  Airway Management Planned: LMA and Oral ETT  Additional Equipment:   Intra-op Plan:   Post-operative Plan: Extubation in OR  Informed Consent: I have reviewed the patients History and Physical, chart, labs and discussed the procedure including the risks, benefits and  alternatives for the proposed anesthesia with the patient or authorized representative who has indicated his/her understanding and acceptance.   Dental advisory given  Plan Discussed with: CRNA  Anesthesia Plan Comments:         Anesthesia Quick Evaluation

## 2017-10-04 NOTE — Op Note (Signed)
10/04/2017  9:47 AM  PATIENT:   Terry Lyons  72 y.o. male  PRE-OPERATIVE DIAGNOSIS:  left shoulder osteoarthritis  POST-OPERATIVE DIAGNOSIS:  same  PROCEDURE:  L TSA #13 stem, 50X19 concentric head, large glenoid  SURGEON:  Debra Calabretta, Metta Clines M.D.  ASSISTANTS: Shuford pac   ANESTHESIA:   LMA + ISB with exparel  EBL: 150  SPECIMEN:  none  Drains: none   PATIENT DISPOSITION:  PACU - hemodynamically stable.    PLAN OF CARE: Admit for overnight observation  Brief history:  Terry Lyons is brought to the operating room today for planned left total shoulder arthroplasty.  He has had progressive increasing pain and functional limitations related to end-stage osteoarthritis.  Plain radiographs confirm bone-on-bone changes with peripheral ossified formation.  Preoperatively and counseled him regarding treatment options and the potential risks versus benefits thereof.  Possible surgical complications were all reviewed including potential bleeding, infection, neurovascular injury, persistent pain, anesthetic complication, failure the implant, and possible need for additional surgery.  He understands and accepts and agrees with her planned procedure.  Procedure detail:  After undergoing routine preop evaluation patient received prophylactic antibiotics and interscalene block was established in the holding area by the anesthesia department.  Placed supine on the operative table underwent smooth induction of a LMA general anesthesia.  Placed in the beachchair position and appropriate padding protected.  Left shoulder girdle region was sterilely prepped and draped in standard fashion.  Timeout was called.  An anterior deltopectoral approach left shoulder was made through a 10 cm incision.  Skin flaps elevated dissection carried deeply with the cephalic vein taken laterally with the deltoid pectoralis major retracted medially the upper centimeter was then tenotomized to enhance exposure conjoined  tendon mobilized retracted medially.  Long head biceps tendon was then unroofed tenodesed at the upper border the pectoralis major tendon tenotomized the proximal segment was then removed and excised.  Then ligated the anterior humeral circumflex vessels with electrocautery and then divided the rotator cuff along the rotator interval to the base of the coracoid and then identified the subscapularis insertion of the lesser tuberosity using oscillating saw to create a lesser tuberosity osteotomy creating a very thin wafer of bone for later repair.  The capsular attachments from the anterior inferior and inferior aspects of the humeral neck within divided and subperiosteal fashion allowing deliver the humeral head through the wound.  We carefully protected the rotator cuff superiorly and posteriorly then outlined the proposed humeral head resection with the extra medullary guide head was then resected with an oscillating saw and then we remove the peripheral osteophytes with a rondure.  The humeral canal was then prepared hand reaming to size 7 broaching up to size 13 maintain the native retroversion approximate 20 degrees retroversion.  This point a size 12 stem with metal cap was then placed into the humeral canal we then exposed the glenoid with combination of Fukuda, pitchfork, and snake tongue retractors.  The proximal aspect of the long head biceps tendon was then identified we then performed a circumferential labral resection gaining complete visualization the periphery of the glenoid.  Guidepin was placed into the center of the glenoid and we reamed with a large reamer after appropriate sizing.  We then completed preparation of the glenoid with the central drill followed by the superior and inferior peg and slot respectively the glenoid was then broached in the trial showed excellent fit.  This point the glenoid was then irrigated cleaned and dried cement was  mixed introduced into the superior and inferior peg  and slot respectively.  The final then was then impacted with excellent fit fixation.  We then returned our attention to the proximal humerus where we prepared drill holes through the humeral medial calcar and then 2 drill holes through the bicipital groove and used these to then place a series of sutures that we had woven through the eyelets on her humeral stem all to allow repair of our lesser tuberosity osteotomy.  Once the sutures were all then appropriately passed we impacted our humeral stem with excellent fit and fixation.  We then performed tightening of the proximal locking screws.  At this point we then performed a series of trial reductions and felt that the 50 x 19 concentric head gave [place] excellent soft tissue balance with approximately 50% translation of the humeral head of the glenoid.  The Morse taper was then cleaned and dried the final 50 x 19 eccentric head was then impacted.  Final reduction was performed after the joint was copiously irrigated and hemostasis was obtained.  At this point we then performed our repair of the lesser tuberosity osteotomy bringing our medial suture limbs up through the bone tendon junction of the lesser tuberosity osteotomy fragment and then performed a suture repair with a superior and inferior horizontal suture limb tightened and then a series of crossing sutures all of which allowed [place] to allow excellent re-apposition good compression of the bone fragment to the lesser tuberosity footprint.  Suture limbs all then clipped.  We then repaired the rotator interval as well with a series of suture tape figure-of-eight sutures.  At the completion were very pleased with the overall soft tissue balance and the shoulder easily achieved 45 degrees of external rotation without excessive tension on the subscapularis repair.  The wound was then irrigated hemostasis obtained.  The deltopectoral interval was then reapproximated with a series of figure-of-eight #1 Vicryl sutures.   2-0 Vicryl used for the subcu layer intracuticular through Monocryl for the skin followed by Dermabond and Aquasol dressing.  The left arm was placed into a sling.  Patient was awakened extubated taken recovery room in stable condition  Olivia Mackie for PA-C was used as an Environmental consultant throughout this case essential for help with positioning the patient, position extremity, tissue manipulation, implantation prosthesis, wound closure, and intraoperative decision-making.   Marin Shutter, MD   Contact # [phone]

## 2017-10-05 ENCOUNTER — Encounter (HOSPITAL_COMMUNITY): Payer: Self-pay | Admitting: Orthopedic Surgery

## 2017-10-05 LAB — GLUCOSE, CAPILLARY: Glucose-Capillary: 135 mg/dL — ABNORMAL HIGH (ref 70–99)

## 2017-10-05 MED ORDER — OXYCODONE-ACETAMINOPHEN 5-325 MG PO TABS: 1.0000 | ORAL_TABLET | ORAL | 0 refills | Status: DC | PRN

## 2017-10-05 MED ORDER — ONDANSETRON HCL 4 MG PO TABS: 4.0000 mg | ORAL_TABLET | Freq: Three times a day (TID) | ORAL | 0 refills | Status: DC | PRN

## 2017-10-05 MED ORDER — KETOROLAC TROMETHAMINE 60 MG/2ML IM SOLN
60.0000 mg | Freq: Once | INTRAMUSCULAR | Status: AC
  Administered 2017-10-05: 60 mg via INTRAMUSCULAR
  Filled 2017-10-05: qty 2

## 2017-10-05 MED ORDER — CYCLOBENZAPRINE HCL 10 MG PO TABS: 10.0000 mg | ORAL_TABLET | Freq: Three times a day (TID) | ORAL | 0 refills | Status: DC | PRN

## 2017-10-05 NOTE — Evaluation (Signed)
Occupational Therapy Evaluation Patient Details Name: Terry Lyons MRN: [id number] DOB: May 25, 1945 Today's Date: 10/05/2017    History of Present Illness This 72 y.o. male admitted for Lt TSA    Clinical Impression   Patient evaluated by Occupational Therapy with no further acute OT needs identified. All education has been completed and the patient has no further questions. All education completed.   See below for any follow-up Occupational Therapy or equipment needs. OT is signing off. Thank you for this referral.  Wife under impression he would be seeing PT.  He is mildly unbalanced due to lethargy and meds.  He has no h/o falls, and does not use AD. Marland Kitchen  Both he and wife feel that a PT consult is not needed.           Follow Up Recommendations  Follow surgeon's recommendation for DC plan and follow-up therapies    Equipment Recommendations  None recommended by OT    Recommendations for Other Services       Precautions / Restrictions Precautions Precautions: Shoulder Type of Shoulder Precautions: May come out of sling if sitting in controlled environment. ie while watching tv, eating etc to give neck and skin break from sling. Please sleep in sling though until 4 weeks post op. Precaution Comments: New PROM restrictions (8/18) for use in hygiene and ADL only  ER 20; ABD 45; FE 60 Required Braces or Orthoses: Sling Restrictions Weight Bearing Restrictions: Yes LUE Weight Bearing: Non weight bearing      Mobility Bed Mobility               General bed mobility comments: Pt sleeping in recliner and reports he will sleep in recliner at discharge   Transfers Overall transfer level: Modified independent                    Balance Overall balance assessment: Mild deficits observed, not formally tested                                         ADL either performed or assessed with clinical judgement   ADL Overall ADL's : Needs assistance/impaired                     Lower Body Dressing: Moderate assistance;Sit to/from stand;Maximal assistance   Toilet Transfer: Min guard;Ambulation;Comfort height toilet   Toileting- Clothing Manipulation and Hygiene: Moderate assistance       Functional mobility during ADLs: Min guard General ADL Comments: wife able to assist with ADLs      Vision         Perception     Praxis      Pertinent Vitals/Pain Pain Assessment: 0-10 Pain Score: 5  Pain Location: Lt shoulder  Pain Descriptors / Indicators: Aching;Operative site guarding Pain Intervention(s): Monitored during session;Repositioned;Patient requesting pain meds-RN notified     Hand Dominance     Extremity/Trunk Assessment Upper Extremity Assessment Upper Extremity Assessment: LUE deficits/detail LUE Deficits / Details: elbow distally WFL. shoulder immobilized    Lower Extremity Assessment Lower Extremity Assessment: Overall WFL for tasks assessed       Communication     Cognition Arousal/Alertness: Awake/alert;Lethargic Behavior During Therapy: WFL for tasks assessed/performed Overall Cognitive Status: Impaired/Different from baseline  General Comments: Pt requires instructions to be repeated - likely due to meds and lethargy    General Comments  wife reports she has been assisting him to BR.  He is mildly unsteady likely due to pain meds and lethargy.   He and wife deny h/o falls or mobility impairment     Exercises Exercises: Shoulder Shoulder Exercises Pendulum Exercise: Left;Standing;10 reps Elbow Flexion: AROM;Left;10 reps;Standing Elbow Extension: AROM;Left;10 reps;Seated Wrist Flexion: AROM;Left;10 reps;Seated;Standing Wrist Extension: AROM;Left;10 reps;Seated;Standing Digit Composite Flexion: AROM;Left;10 reps;Standing;Seated Composite Extension: AROM;Left;10 reps;Seated Neck Flexion: AROM;5 reps;Standing Neck Extension: AROM;5 reps;Seated Neck  Lateral Flexion - Right: AROM;5 reps;Seated Neck Lateral Flexion - Left: AROM;5 reps;Seated   Shoulder Instructions Shoulder Instructions Donning/doffing shirt without moving shoulder: Caregiver independent with task Method for sponge bathing under operated UE: Supervision/safety;Caregiver independent with task Donning/doffing sling/immobilizer: Caregiver independent with task Correct positioning of sling/immobilizer: Caregiver independent with task Pendulum exercises (written home exercise program): Supervision/safety ROM for elbow, wrist and digits of operated UE: Supervision/safety Sling wearing schedule (on at all times/off for ADL's): Supervision/safety Proper positioning of operated UE when showering: Supervision/safety;Caregiver independent with task Positioning of UE while sleeping: Supervision/safety;Caregiver independent with task    Home Living                                          Prior Functioning/Environment                   OT Problem List: Decreased strength;Decreased range of motion;Decreased knowledge of precautions;Impaired UE functional use;Pain      OT Treatment/Interventions:      OT Goals(Current goals can be found in the care plan section) Acute Rehab OT Goals Patient Stated Goal: To go home, and have less pain  OT Goal Formulation: All assessment and education complete, DC therapy  OT Frequency:     Barriers to D/C:            Co-evaluation              AM-PAC PT "6 Clicks" Daily Activity     Outcome Measure Help from another person eating meals?: None Help from another person taking care of personal grooming?: A Little Help from another person toileting, which includes using toliet, bedpan, or urinal?: A Little Help from another person bathing (including washing, rinsing, drying)?: A Little Help from another person to put on and taking off regular upper body clothing?: A Lot Help from another person to put on and  taking off regular lower body clothing?: A Lot 6 Click Score: 17   End of Session Equipment Utilized During Treatment: Other (comment)(sling ) Nurse Communication: Mobility status;Patient requests pain meds  Activity Tolerance: Patient limited by pain Patient left: in chair;with call bell/phone within reach;with nursing/sitter in room;with family/visitor present  OT Visit Diagnosis: Pain Pain - Right/Left: Left Pain - part of body: Shoulder                Time: 9509-3267 OT Time Calculation (min): 29 min Charges:  OT General Charges $OT Visit: 1 Visit OT Evaluation $OT Eval Low Complexity: 1 Low OT Treatments $Self Care/Home Management : 8-22 mins  [facility], OTR/L [phone]   Lucille Passy M 10/05/2017, 10:55 AM

## 2017-10-05 NOTE — Progress Notes (Signed)
Terry Lyons  MRN: [id number] DOB/Age: September 14, 1945 72 y.o. [facility] Procedure: Procedure(s) (LRB): LEFT TOTAL SHOULDER ARTHROPLASTY (Left)     Subjective: Unfortunately had a lot of pain and the block did not last as long as we had hoped for. Required oxycodone several times through night and is very concerned about pain control going home.  Vital Signs Temp:  [97.5 F (36.4 C)-99.6 F (37.6 C)] 98.3 F (36.8 C) (08/23 0508) Pulse Rate:  [84-112] 102 (08/23 0508) Resp:  [12-21] 16 (08/23 0508) BP: (109-149)/(60-74) 128/71 (08/23 0508) SpO2:  [89 %-96 %] 89 % (08/23 0508)  Lab Results No results for input(s): WBC, HGB, HCT, PLT in the last 72 hours. BMET No results for input(s): NA, K, CL, CO2, GLUCOSE, BUN, CREATININE, CALCIUM in the last 72 hours. INR  Date Value Ref Range Status  06/09/2008 1.2 0.00 - 1.49 Final     Exam Left shoulder dressing dry His block has worn off and has sensation and motor in elbow wrist and hand NVI        Plan Long discussion with patient, wife and daughter who works as an Health visitor regarding pain expectations and plan for control Requested IM toradol dose prior to DC , this was ordered Will see OT and plan for DC later today  [facility] PA-C  10/05/2017, 8:22 AM Contact # [phone]

## 2017-10-05 NOTE — Progress Notes (Signed)
AVS given and discussed with pt, wife, and daughter. All questions answered to satisfaction. Pt to be escorted off unit via wheelchair by volunteer services.

## 2017-10-09 ENCOUNTER — Other Ambulatory Visit: Payer: Self-pay

## 2017-10-09 NOTE — Patient Outreach (Signed)
Terry Lyons  Terry Lyons   10/09/2017  Terry Lyons Jun 15, 1945 [id number]   72 year old male outreached by [facility] services for a  30 day post discharge medication review.  PMHx includes, but not limited to, hypertension, diastolic heart failure, GERD, Type 2 diabetes mellitus, hypothyroidism, gout and hyperlipidemia.'  Successful outreach attempt to Terry Lyons.  HIPAA identifiers verified.   Subjective: Terry Lyons reports that he is feeling okay after his left shoulder arthroplasty.  He states that he is only taking his pain medication at bedtime.  He report that he checks his glucose about once a week with values between 119-175 mg/dL.  He has agreed for outreach by [facility] RN for diabetes coaching.    Objective:  HgA1c 6.3% on 09/25/17 SCr 1.14 mg/dL on 09/25/17  Current Medications: Current Outpatient Medications  Medication Sig Dispense Refill  . allopurinol (ZYLOPRIM) 300 MG tablet Take 300 mg by mouth daily.     Marland Kitchen aspirin EC 81 MG tablet Take 81 mg by mouth daily.    . cetirizine (ZYRTEC) 10 MG tablet Take 10 mg by mouth daily.    . Cholecalciferol (VITAMIN D PO) Take 5,000 Units by mouth daily.     . Coenzyme Q10 (CO Q 10 PO) Take 1 capsule by mouth daily.     . cyclobenzaprine (FLEXERIL) 10 MG tablet Take 1 tablet (10 mg total) by mouth 3 (three) times daily as needed for muscle spasms. 30 tablet 0  . doxazosin (CARDURA) 4 MG tablet Take 1 tablet (4 mg total) by mouth at bedtime. 90 tablet 1  . furosemide (LASIX) 40 MG tablet Take 1 tablet (40 mg total) by mouth 2 (two) times daily. 180 tablet 2  . gabapentin (NEURONTIN) 300 MG capsule Take 300 mg by mouth daily as needed (pain).     Marland Kitchen KETOCONAZOLE, TOPICAL, 1 % SHAM Apply 1 application topically See admin instructions. Every 2-3 days when he washes hair.     Marland Kitchen levothyroxine (SYNTHROID, LEVOTHROID) 50 MCG tablet Take 50 mcg by mouth daily before breakfast.    . Menthol-Methyl Salicylate  (THERA-GESIC EX) Apply 1 application topically daily as needed (knee pain.).    Marland Kitchen metFORMIN (GLUCOPHAGE) 500 MG tablet Take 500 mg by mouth 2 (two) times daily with a meal.    . metoprolol (LOPRESSOR) 50 MG tablet Take 50 mg by mouth 2 (two) times daily.    . mirtazapine (REMERON) 15 MG tablet Take 15 mg by mouth at bedtime.  11  . Multiple Vitamin (MULTIVITAMIN WITH MINERALS) TABS tablet Take 1 tablet by mouth daily. PACKET    . Omega-3 Fatty Acids (FISH OIL) 1000 MG CAPS Take 1 capsule by mouth daily.     Marland Kitchen omeprazole (PRILOSEC) 20 MG capsule Take 20 mg by mouth daily.     . ondansetron (ZOFRAN) 4 MG tablet Take 1 tablet (4 mg total) by mouth every 8 (eight) hours as needed for nausea or vomiting. 10 tablet 0  . potassium chloride (K-DUR) 10 MEQ tablet Take 10 mEq by mouth daily.    . Red Yeast Rice Extract (RED YEAST RICE PO) Take 1 capsule by mouth at bedtime.     . Saw Palmetto, Serenoa repens, (SAW PALMETTO PO) Take 1 capsule by mouth daily.    . temazepam (RESTORIL) 15 MG capsule Take 15 mg by mouth at bedtime as needed. Sleep    . triamcinolone cream (KENALOG) 0.1 % Apply 1 application topically daily as  needed (rosacea).     Marland Kitchen acetaminophen (TYLENOL) 325 MG tablet Take 650 mg by mouth every 6 (six) hours as needed for moderate pain.    Marland Kitchen oxyCODONE-acetaminophen (PERCOCET) 5-325 MG tablet Take 1 tablet by mouth every 4 (four) hours as needed (max 6 q). (Patient not taking: Reported on 10/09/2017) 42 tablet 0   No current facility-administered medications for this visit.     Functional Status: In your present state of health, do you have any difficulty performing the following activities: 09/25/2017  Hearing? N  Vision? N  Difficulty concentrating or making decisions? N  Walking or climbing stairs? Y  Comment left knee.   Dressing or bathing? N  Doing errands, shopping? N  Some recent data might be hidden    Fall/Depression Screening: Fall Risk  05/21/2013 03/06/2012  Falls in the  past year? No No   PHQ 2/9 Scores 05/21/2013 03/06/2012  PHQ - 2 Score 0 0   ASSESSMENT: Date Discharged from Hospital: 10/04/17 Date Medication Reconciliation Performed: 10/09/2017  Medications Discontinued at Discharge:   Hydrocodone/APAP  New Medications at Discharge:   Oxycodone/APAP  Cyclobenzaprine  Ondansetron  Patient was recently discharged from hospital and all medications have been reviewed   Drugs sorted by system:  Neurologic/Psychologic: mirtazapine, temazepam  Cardiovascular: aspirin 81 mg, doxazosin, furosemide, metoprolol, potassium chloride  Pulmonary/Allergy: cetirizine  Gastrointestinal: omeprazole, ondansetron  Endocrine: levothyroxine, metformin  Topical: ketoconazole shampoo, Theragesic, triamcinolone cream  Pain: acetaminophen, gabapentin, oxycodone/APAP  Vitamins/Minerals: cholecalciferol, coenzyme Q10, MVI, omega-3, red yeast rice extract, saw palmetto  Miscellaneous: allopurinol, cyclobenzaprine  Duplications in therapy: Patient is taking temazepam and mirtazapine for sleep.  Please consider discontinuing temazepam.   Gaps in therapy:  Patient with Type 2 diabetes mellitus and is not on an ACEI or ARB.    Medications to avoid in the elderly:  Per the [employer] List, temazepam may have decreased metabolism and increases sensitivity in older adults.  Risk of cognitive impairment, delirium, falls, fractures and motor vehicle accidents may occur. There is strong evidence to avoid use in the elderly.  Per the [place] List, cyclobenzaprine may be poorly tolerated by older adults.  Muscle relaxants may have anticholinergic adverse effects, sedation, increased risk of fractures and effectiveness at dosages tolerated by older adults is questionable.  There is strong evidence to avoid use in the elderly  Per the [facility] List, doxazosin has a high risk of orthostatic hypotension and is not recommended for routine treatment of hypertension.  I don't see benign  prostatic hyperplasia listed as a problem.  Other issues noted:  Patient was told to discontinue hydrocodone post discharge.  Terry Lyons reports that he did get his oxycodone prescription filled, but that he is taking hydrocodone instead.  He states he is not taking the oxycodone.   Plan: Route note to PCP, Dr. Joylene Draft,  Order [facility] telephonic outreach for diabetes coaching.  Joetta Manners, PharmD Clinical Pharmacist [place] [phone]

## 2017-10-11 DIAGNOSIS — R05 Cough: Secondary | ICD-10-CM | POA: Diagnosis not present

## 2017-10-11 DIAGNOSIS — Z6841 Body Mass Index (BMI) 40.0 and over, adult: Secondary | ICD-10-CM | POA: Diagnosis not present

## 2017-10-11 DIAGNOSIS — J40 Bronchitis, not specified as acute or chronic: Secondary | ICD-10-CM | POA: Diagnosis not present

## 2017-10-16 NOTE — Discharge Summary (Signed)
PATIENT ID:      Terry Lyons  MRN:     [id number] DOB/AGE:    1945-10-15 / 72 y.o.     DISCHARGE SUMMARY  ADMISSION DATE:    10/04/2017 DISCHARGE DATE:  10/05/2017  ADMISSION DIAGNOSIS: left shoulder osteoarthritis Past Medical History:  Diagnosis Date  . Allergy   . Arthritis   . CHF (congestive heart failure) ([place])    hospitalized in 2017; was diagnosed with an acute excacerbation.   . Colon polyps    adenomatous  . Diabetes mellitus   . GERD (gastroesophageal reflux disease)   . Gout   . History of basal cell carcinoma    W-S Derm  . History of squamous cell carcinoma   . Hyperlipidemia   . Hypertension   . Obesity   . Rosacea   . Tachycardia     DISCHARGE DIAGNOSIS:   Active Problems:   Status post total shoulder arthroplasty   PROCEDURE: Procedure(s): LEFT TOTAL SHOULDER ARTHROPLASTY on 10/04/2017  CONSULTS:    HISTORY:  See H&P in chart.  HOSPITAL COURSE:  Terry Lyons is a 72 y.o. admitted on 10/04/2017 with a diagnosis of left shoulder osteoarthritis.  They were brought to the operating room on 10/04/2017 and underwent Procedure(s): LEFT TOTAL SHOULDER ARTHROPLASTY.    They were given perioperative antibiotics:  Anti-infectives (From admission, onward)   Start     Dose/Rate Route Frequency Ordered Stop   10/04/17 0630  ceFAZolin (ANCEF) 3 g in dextrose 5 % 50 mL IVPB     3 g 100 mL/hr over 30 Minutes Intravenous To [facility]Stay Surgical 10/03/17 0802 10/04/17 1732    .  Patient underwent the above named procedure and tolerated it well. The following day they were hemodynamically stable and pain was controlled on oral analgesics. They were neurovascularly intact to the operative extremity. OT was ordered and worked with patient per protocol. They were medically and orthopaedically stable for discharge on 10/05/2017.    DIAGNOSTIC STUDIES:  RECENT RADIOGRAPHIC STUDIES :  No results found.  RECENT VITAL SIGNS:  No data found.Marland Kitchen  RECENT EKG RESULTS:    Orders  placed or performed in visit on 11/01/16  . EKG 12-Lead    DISCHARGE INSTRUCTIONS:    DISCHARGE MEDICATIONS:   Allergies as of 10/05/2017      Reactions   Sulfa Antibiotics    UNSPECIFIED REACTION    Bupropion Hcl Other (See Comments)   REACTION: insomnia   Codeine Nausea Only   Escitalopram Oxalate Other (See Comments)   REACTION: insomnia   Sertraline Hcl Other (See Comments)   REACTION: insomnia   Sulfur Other (See Comments)      Medication List    STOP taking these medications   HYDROcodone-acetaminophen 7.5-325 MG tablet Commonly known as:  NORCO     TAKE these medications   acetaminophen 325 MG tablet Commonly known as:  TYLENOL Take 650 mg by mouth every 6 (six) hours as needed for moderate pain.   allopurinol 300 MG tablet Commonly known as:  ZYLOPRIM Take 300 mg by mouth daily.   aspirin EC 81 MG tablet Take 81 mg by mouth daily.   cetirizine 10 MG tablet Commonly known as:  ZYRTEC Take 10 mg by mouth daily.   CO Q 10 PO Take 1 capsule by mouth daily.   cyclobenzaprine 10 MG tablet Commonly known as:  FLEXERIL Take 1 tablet (10 mg total) by mouth 3 (three) times daily as needed for muscle spasms.  doxazosin 4 MG tablet Commonly known as:  CARDURA Take 1 tablet (4 mg total) by mouth at bedtime.   Fish Oil 1000 MG Caps Take 1 capsule by mouth daily.   furosemide 40 MG tablet Commonly known as:  LASIX Take 1 tablet (40 mg total) by mouth 2 (two) times daily.   gabapentin 300 MG capsule Commonly known as:  NEURONTIN Take 300 mg by mouth daily as needed (pain).   KETOCONAZOLE (TOPICAL) 1 % Sham Apply 1 application topically See admin instructions. Every 2-3 days when he washes hair.   levothyroxine 50 MCG tablet Commonly known as:  SYNTHROID, LEVOTHROID Take 50 mcg by mouth daily before breakfast.   metFORMIN 500 MG tablet Commonly known as:  GLUCOPHAGE Take 500 mg by mouth 2 (two) times daily with a meal.   metoprolol tartrate 50 MG  tablet Commonly known as:  LOPRESSOR Take 50 mg by mouth 2 (two) times daily.   mirtazapine 15 MG tablet Commonly known as:  REMERON Take 15 mg by mouth at bedtime.   multivitamin with minerals Tabs tablet Take 1 tablet by mouth daily. PACKET   omeprazole 20 MG capsule Commonly known as:  PRILOSEC Take 20 mg by mouth daily.   ondansetron 4 MG tablet Commonly known as:  ZOFRAN Take 1 tablet (4 mg total) by mouth every 8 (eight) hours as needed for nausea or vomiting.   oxyCODONE-acetaminophen 5-325 MG tablet Commonly known as:  PERCOCET/ROXICET Take 1 tablet by mouth every 4 (four) hours as needed (max 6 q).   potassium chloride 10 MEQ tablet Commonly known as:  K-DUR Take 10 mEq by mouth daily.   RED YEAST RICE PO Take 1 capsule by mouth at bedtime.   SAW PALMETTO PO Take 1 capsule by mouth daily.   temazepam 15 MG capsule Commonly known as:  RESTORIL Take 15 mg by mouth at bedtime as needed. Sleep   THERA-GESIC EX Apply 1 application topically daily as needed (knee pain.).   triamcinolone cream 0.1 % Commonly known as:  KENALOG Apply 1 application topically daily as needed (rosacea).   VITAMIN D PO Take 5,000 Units by mouth daily.       FOLLOW UP VISIT:   Follow-up Information    Justice Britain, MD.   Specialty:  Orthopedic Surgery Why:  call to be seen in 10-14 days Contact information: [address] [place] [address] [phone]           DISCHARGE TO: Home   DISCHARGE CONDITION:  Thereasa Parkin Terry Lyons for Dr. Justice Britain 10/16/2017, 4:33 PM

## 2017-10-17 DIAGNOSIS — M19012 Primary osteoarthritis, left shoulder: Secondary | ICD-10-CM | POA: Diagnosis not present

## 2017-10-17 DIAGNOSIS — Z4789 Encounter for other orthopedic aftercare: Secondary | ICD-10-CM | POA: Diagnosis not present

## 2017-10-24 DIAGNOSIS — M25612 Stiffness of left shoulder, not elsewhere classified: Secondary | ICD-10-CM | POA: Diagnosis not present

## 2017-10-29 DIAGNOSIS — M25612 Stiffness of left shoulder, not elsewhere classified: Secondary | ICD-10-CM | POA: Diagnosis not present

## 2017-10-31 DIAGNOSIS — M25612 Stiffness of left shoulder, not elsewhere classified: Secondary | ICD-10-CM | POA: Diagnosis not present

## 2017-11-05 DIAGNOSIS — M25612 Stiffness of left shoulder, not elsewhere classified: Secondary | ICD-10-CM | POA: Diagnosis not present

## 2017-11-06 ENCOUNTER — Other Ambulatory Visit: Payer: Self-pay

## 2017-11-06 ENCOUNTER — Ambulatory Visit: Payer: Self-pay

## 2017-11-06 NOTE — Patient Outreach (Signed)
[facility]) Care Management  11/06/2017  Terry Lyons 07/25/1945 [id number]    1st outreach attempt to the patient for initial assessment. .  Patient able to verify HIPAA.  Explained to the patient health coach role and [facility] services.  Patient is open to the call but states that he was unable to talk at the time and asked if I could call him back.  Plan: RN Health Coach will make another outreach attempt to the patient within thirty days.   [name] RN, BSN, [place] Direct Dial:  [phone]  Fax: [phone]

## 2017-11-07 DIAGNOSIS — M25612 Stiffness of left shoulder, not elsewhere classified: Secondary | ICD-10-CM | POA: Diagnosis not present

## 2017-11-08 ENCOUNTER — Other Ambulatory Visit: Payer: Self-pay

## 2017-11-08 NOTE — Patient Outreach (Signed)
[place] [facility]) Care Management  11/08/2017  Terry Lyons [birth year] [id number]    Telephone call to the patient for initial assessment. Patient answered the phone and stated that he was getting ready to leave the home.  He states that next week would be a better time.  RN Health left contact information with the patient.  Plan: RN Health Coach will send letter. [place] will  make another outreach attempt to the patient within thirty days.   [name] RN, BSN, [place] Direct Dial:  [phone]  Fax: [phone]

## 2017-11-12 DIAGNOSIS — M25612 Stiffness of left shoulder, not elsewhere classified: Secondary | ICD-10-CM | POA: Diagnosis not present

## 2017-11-14 DIAGNOSIS — M25612 Stiffness of left shoulder, not elsewhere classified: Secondary | ICD-10-CM | POA: Diagnosis not present

## 2017-11-19 DIAGNOSIS — M25612 Stiffness of left shoulder, not elsewhere classified: Secondary | ICD-10-CM | POA: Diagnosis not present

## 2017-11-27 DIAGNOSIS — J189 Pneumonia, unspecified organism: Secondary | ICD-10-CM | POA: Diagnosis not present

## 2017-11-28 DIAGNOSIS — M25612 Stiffness of left shoulder, not elsewhere classified: Secondary | ICD-10-CM | POA: Diagnosis not present

## 2017-12-03 DIAGNOSIS — M25612 Stiffness of left shoulder, not elsewhere classified: Secondary | ICD-10-CM | POA: Diagnosis not present

## 2017-12-06 DIAGNOSIS — M25612 Stiffness of left shoulder, not elsewhere classified: Secondary | ICD-10-CM | POA: Diagnosis not present

## 2017-12-07 ENCOUNTER — Other Ambulatory Visit: Payer: Self-pay

## 2017-12-07 NOTE — Patient Outreach (Signed)
[place] [facility]) Care Management  12/07/2017  JEN EPPINGER [birth year] [id number]    3rd outreach attempt to the patient for initial assessment.  Patient answered the phone and states that he is going to lunch with friends and asked if I could call him at a later time.   Plan:  RN Health Coach will outreach the patient at a later time per patient request.  [name] RN, BSN, [place]:  [phone]  Fax: [phone]

## 2017-12-11 DIAGNOSIS — M25612 Stiffness of left shoulder, not elsewhere classified: Secondary | ICD-10-CM | POA: Diagnosis not present

## 2017-12-14 DIAGNOSIS — M25612 Stiffness of left shoulder, not elsewhere classified: Secondary | ICD-10-CM | POA: Diagnosis not present

## 2017-12-21 ENCOUNTER — Other Ambulatory Visit: Payer: Self-pay

## 2017-12-21 NOTE — Patient Outreach (Signed)
[place] [facility]) Care Management  12/21/2017  Terry Lyons 1945/07/30 [id number]    Returned call to the patient for initial assessment.  HIPAA verified.  The patient was given information about [facility] services again.  The patient stated that he has recently has shoulder surgery and is recuperating and would like some time to think about services and he will call me back.  Plan:  RN Health Coach will wait for the patient to return the call or outreach him in thirty days.  [name] RN, BSN, [place] Direct Dial:  [phone]  Fax: [phone]

## 2017-12-24 DIAGNOSIS — M25612 Stiffness of left shoulder, not elsewhere classified: Secondary | ICD-10-CM | POA: Diagnosis not present

## 2017-12-26 DIAGNOSIS — Z4789 Encounter for other orthopedic aftercare: Secondary | ICD-10-CM | POA: Diagnosis not present

## 2017-12-26 DIAGNOSIS — Z96612 Presence of left artificial shoulder joint: Secondary | ICD-10-CM | POA: Diagnosis not present

## 2017-12-26 DIAGNOSIS — Z471 Aftercare following joint replacement surgery: Secondary | ICD-10-CM | POA: Diagnosis not present

## 2017-12-31 DIAGNOSIS — M109 Gout, unspecified: Secondary | ICD-10-CM | POA: Diagnosis not present

## 2017-12-31 DIAGNOSIS — E7849 Other hyperlipidemia: Secondary | ICD-10-CM | POA: Diagnosis not present

## 2017-12-31 DIAGNOSIS — R82998 Other abnormal findings in urine: Secondary | ICD-10-CM | POA: Diagnosis not present

## 2017-12-31 DIAGNOSIS — E038 Other specified hypothyroidism: Secondary | ICD-10-CM | POA: Diagnosis not present

## 2017-12-31 DIAGNOSIS — E1169 Type 2 diabetes mellitus with other specified complication: Secondary | ICD-10-CM | POA: Diagnosis not present

## 2017-12-31 DIAGNOSIS — E559 Vitamin D deficiency, unspecified: Secondary | ICD-10-CM | POA: Diagnosis not present

## 2017-12-31 DIAGNOSIS — Z125 Encounter for screening for malignant neoplasm of prostate: Secondary | ICD-10-CM | POA: Diagnosis not present

## 2018-01-01 ENCOUNTER — Ambulatory Visit: Payer: Self-pay

## 2018-01-07 DIAGNOSIS — E7849 Other hyperlipidemia: Secondary | ICD-10-CM | POA: Diagnosis not present

## 2018-01-07 DIAGNOSIS — J189 Pneumonia, unspecified organism: Secondary | ICD-10-CM | POA: Diagnosis not present

## 2018-01-07 DIAGNOSIS — Z1389 Encounter for screening for other disorder: Secondary | ICD-10-CM | POA: Diagnosis not present

## 2018-01-07 DIAGNOSIS — E038 Other specified hypothyroidism: Secondary | ICD-10-CM | POA: Diagnosis not present

## 2018-01-07 DIAGNOSIS — Q791 Other congenital malformations of diaphragm: Secondary | ICD-10-CM | POA: Diagnosis not present

## 2018-01-07 DIAGNOSIS — J309 Allergic rhinitis, unspecified: Secondary | ICD-10-CM | POA: Diagnosis not present

## 2018-01-07 DIAGNOSIS — E1169 Type 2 diabetes mellitus with other specified complication: Secondary | ICD-10-CM | POA: Diagnosis not present

## 2018-01-07 DIAGNOSIS — Z0000 Encounter for general adult medical examination without abnormal findings: Secondary | ICD-10-CM | POA: Diagnosis not present

## 2018-01-07 DIAGNOSIS — M539 Dorsopathy, unspecified: Secondary | ICD-10-CM | POA: Diagnosis not present

## 2018-01-07 DIAGNOSIS — F4310 Post-traumatic stress disorder, unspecified: Secondary | ICD-10-CM | POA: Diagnosis not present

## 2018-01-07 DIAGNOSIS — Z6841 Body Mass Index (BMI) 40.0 and over, adult: Secondary | ICD-10-CM | POA: Diagnosis not present

## 2018-01-07 DIAGNOSIS — I5032 Chronic diastolic (congestive) heart failure: Secondary | ICD-10-CM | POA: Diagnosis not present

## 2018-01-08 DIAGNOSIS — Z1212 Encounter for screening for malignant neoplasm of rectum: Secondary | ICD-10-CM | POA: Diagnosis not present

## 2018-01-31 ENCOUNTER — Other Ambulatory Visit: Payer: Self-pay

## 2018-01-31 NOTE — Patient Outreach (Signed)
[place] [facility]) Care Management  01/31/2018  ASIR BINGLEY 1946-01-29 [id number]    5th telephone outreach to the patient for initial assessment. No answer.  HIPAA compliant voicemail left with contact information.  Plan:  RN Health Coach will outreach the patient in three months.  [name] RN, BSN, [place] Direct Dial:  [phone]  Fax: [phone]

## 2018-02-21 IMAGING — CT CT CHEST W/ CM
1 series · 15 of 34 positions shown, 19 images · IV contrast (APPLIED)
Comparison: CTA chest of 03/06/2015 and chest x-ray of 03/10/2015

CLINICAL DATA: Elevated hemidiaphragm, former smoking history

EXAM:
CT CHEST WITH CONTRAST
TECHNIQUE: Multidetector CT imaging of the chest was performed during
intravenous contrast administration.
CONTRAST:  75mL 62NN30-AYY IOPAMIDOL (62NN30-AYY) INJECTION 61%

[Series 2: chest w/cm · axial · 0.81mm/px · z∈[+730,+964]mm · 15 of 139 slices shown, 19 images]
[im 11/139  mediastinal]
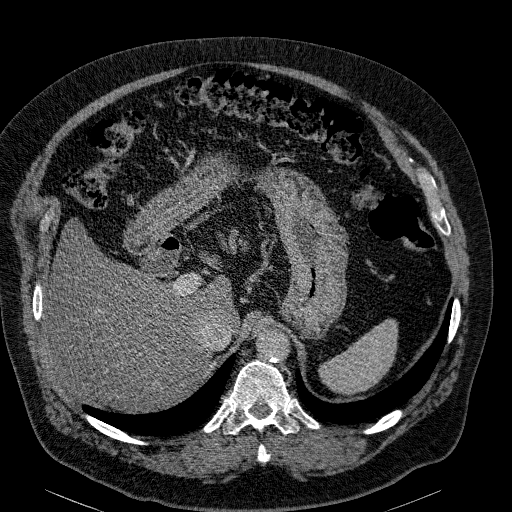
[im 11/139  lung]
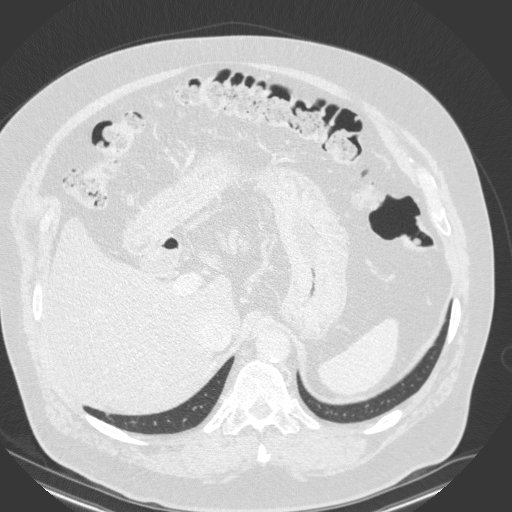
[im 21/139  lung]
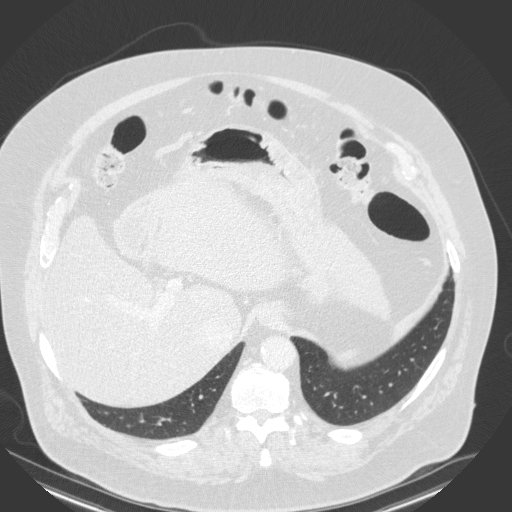
[im 28/139  lung]
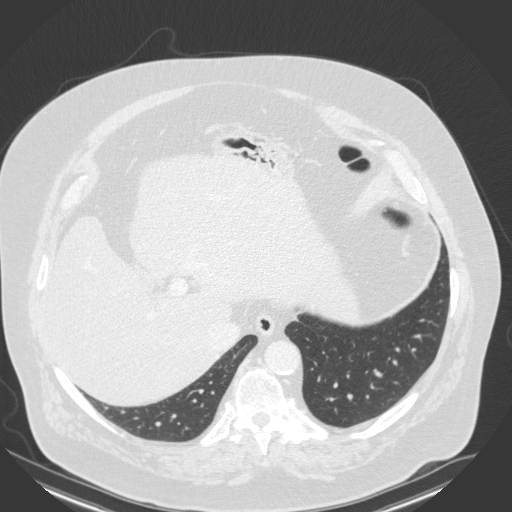
[im 36/139  lung]
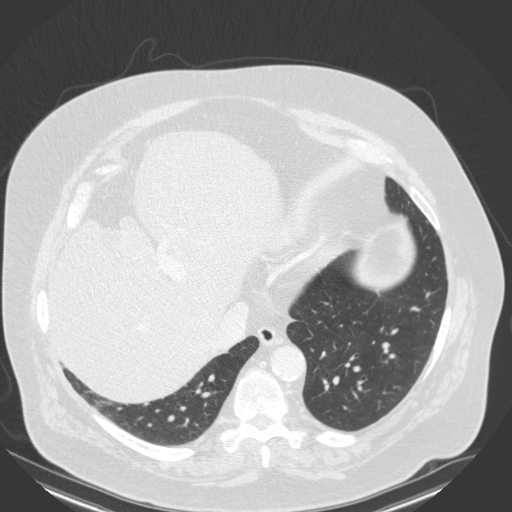
[im 47/139  mediastinal]
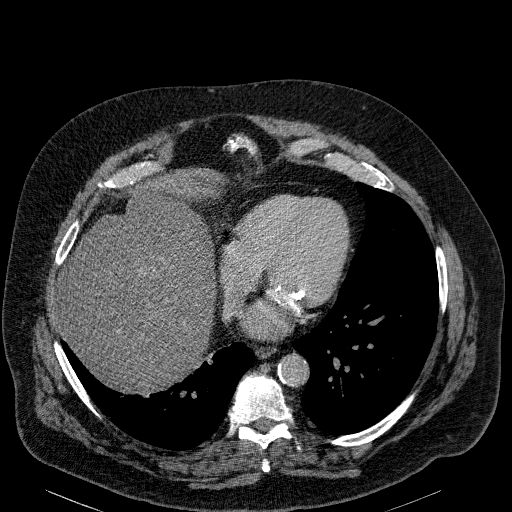
[im 47/139  lung]
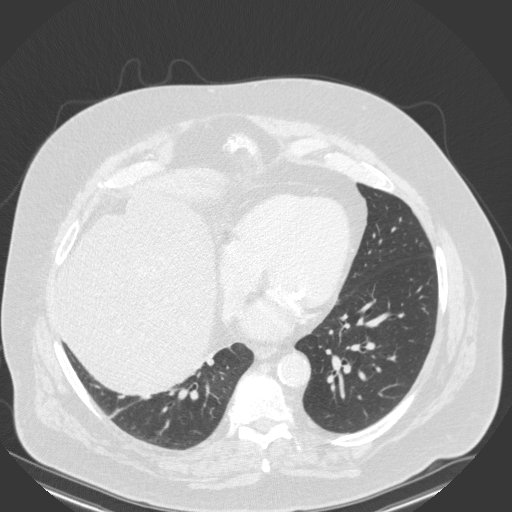
[im 56/139  lung]
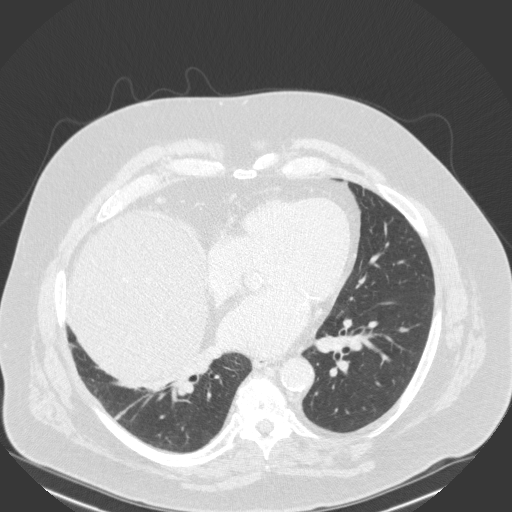
[im 62/139  lung]
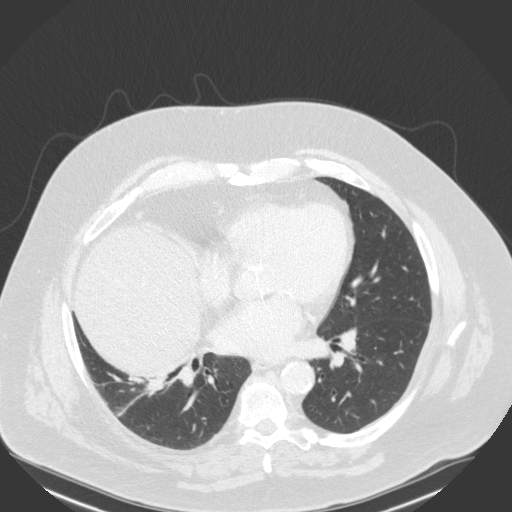
[im 72/139  lung]
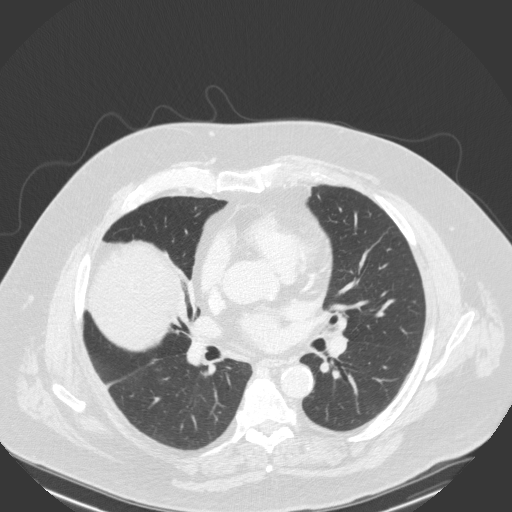
[im 77/139  mediastinal]
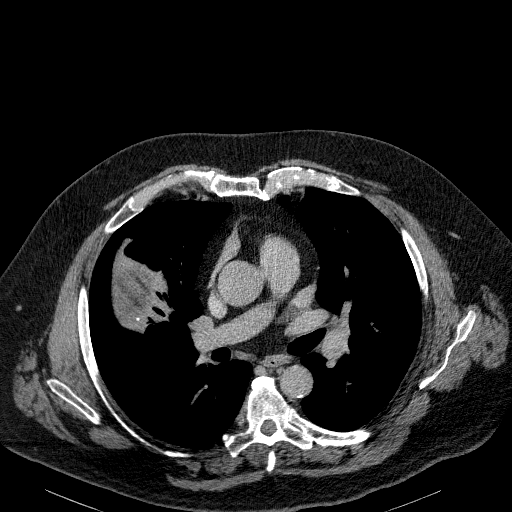
[im 77/139  lung]
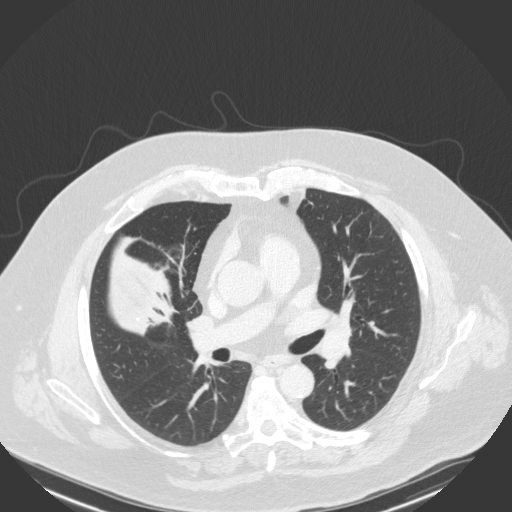
[im 83/139  lung]
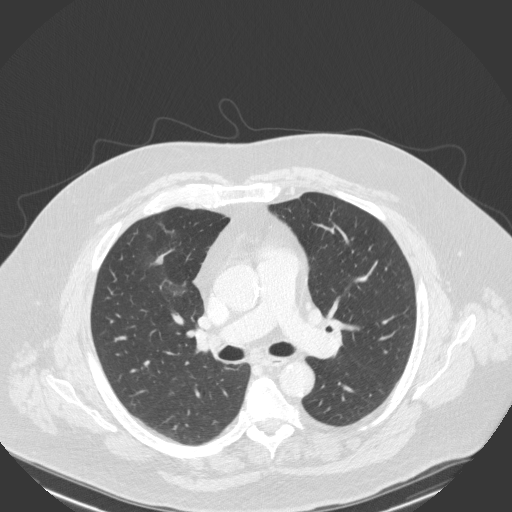
[im 93/139  lung]
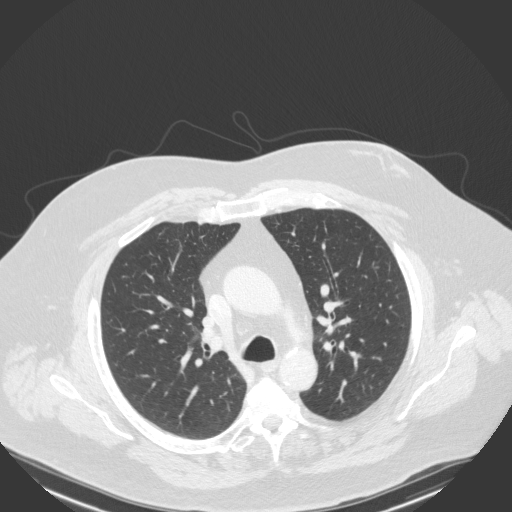
[im 103/139  lung]
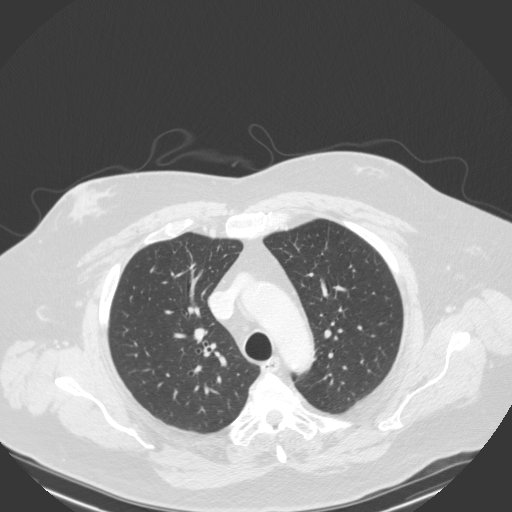
[im 111/139  mediastinal]
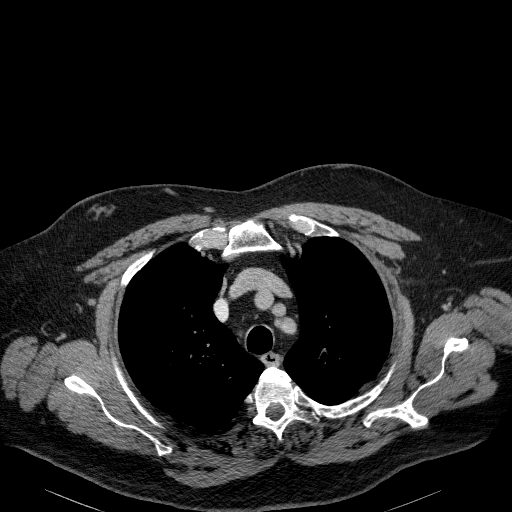
[im 111/139  lung]
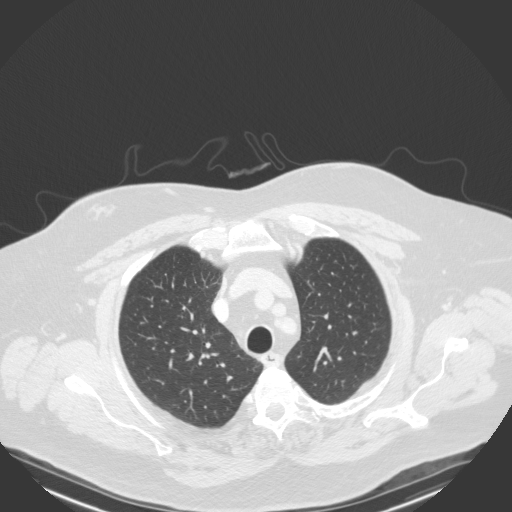
[im 118/139  lung]
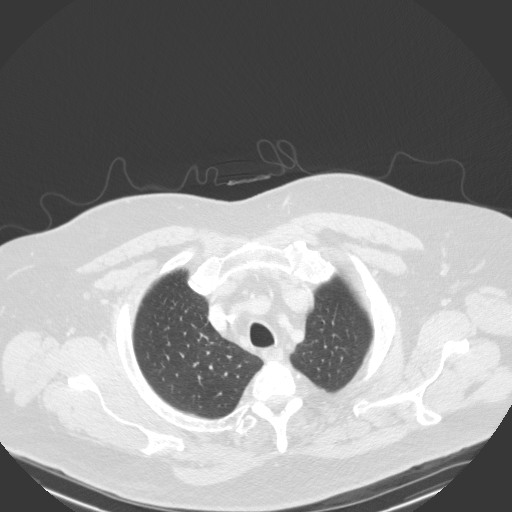
[im 128/139  lung]
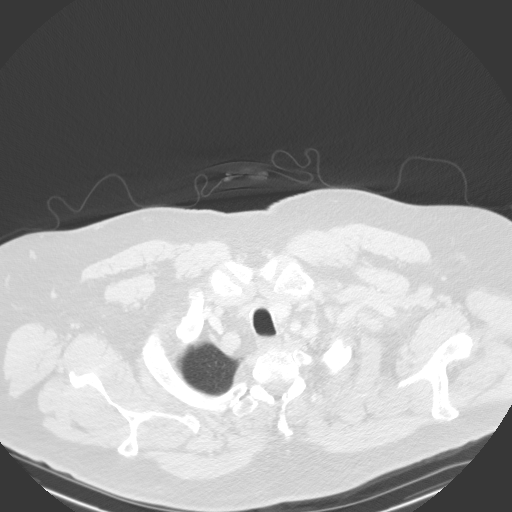

[15 of 34 positions shown; findings below may reference images not displayed]

FINDINGS: Cardiovascular: Mild thoracic aortic atherosclerosis is present. The
heart is within normal limits in size and no pericardial effusion is
seen. The mid ascending thoracic aorta measures 36 mm in diameter.
The pulmonary arteries opacified with no central abnormality
evident. Coronary artery calcifications are noted.

Mediastinum/Nodes: Only small mediastinal lymph nodes are present,
none of which are pathologically enlarged. These nodes are stable
compared to the prior CT. The thyroid gland is normal in size.

Lungs/Pleura: On lung window images, there is chronic elevation of
the right hemidiaphragm with right basilar linear atelectasis and/or
scarring. No new pulmonary nodule is noted. There is no evidence of
parenchymal infiltrate and no pleural effusion is seen. No obvious
reason for the chronic elevation of the right hemidiaphragm is seen.
The central airway is patent.

Upper Abdomen: Within the upper abdomen, the liver appears low in
attenuation consistent with diffuse fatty infiltration. The
gallbladder is contracted and no calcified gallstones are seen.

Musculoskeletal: The thoracic vertebrae are normal alignment with
mild degenerative change.
IMPRESSION: 1. No explanation for the chronic elevation of the right
hemidiaphragm is seen. No mass or adenopathy is noted.
2. Persistent right basilar linear atelectasis or scarring.
3. Diffuse fatty infiltration of the liver.
4. Mild thoracic aortic atherosclerosis with coronary artery
calcifications as well.

## 2018-05-02 ENCOUNTER — Other Ambulatory Visit: Payer: Self-pay

## 2018-05-02 NOTE — Patient Outreach (Signed)
[place] [facility]) Care Management  05/02/2018  Terry Lyons 1945-05-31 [id number]    6th attempt to outreach the patient for initial assessment.  Introduced myself to the patient.  The patient stated that he was cutting up meat at the time and asked if I would call him back.  Plan: RN Health Coach will make outreach attempt to the patient within thirty days.  [name] RN, BSN, [place] Direct Dial:  [phone]  Fax: [phone]

## 2018-05-13 DIAGNOSIS — I5032 Chronic diastolic (congestive) heart failure: Secondary | ICD-10-CM | POA: Diagnosis not present

## 2018-05-13 DIAGNOSIS — F4310 Post-traumatic stress disorder, unspecified: Secondary | ICD-10-CM | POA: Diagnosis not present

## 2018-05-13 DIAGNOSIS — M109 Gout, unspecified: Secondary | ICD-10-CM | POA: Diagnosis not present

## 2018-05-13 DIAGNOSIS — E1169 Type 2 diabetes mellitus with other specified complication: Secondary | ICD-10-CM | POA: Diagnosis not present

## 2018-05-13 DIAGNOSIS — R609 Edema, unspecified: Secondary | ICD-10-CM | POA: Diagnosis not present

## 2018-05-13 DIAGNOSIS — E039 Hypothyroidism, unspecified: Secondary | ICD-10-CM | POA: Diagnosis not present

## 2018-05-13 DIAGNOSIS — I10 Essential (primary) hypertension: Secondary | ICD-10-CM | POA: Diagnosis not present

## 2018-05-31 ENCOUNTER — Other Ambulatory Visit: Payer: Self-pay

## 2018-05-31 NOTE — Patient Outreach (Signed)
[place] [facility]) Care Management  05/31/2018   Terry Lyons January 18, 1946 [id number]      Outreach attempt # 7 to thee patient for assessment.  HIPAA provided by the patient.  He was able to complete the assessment.  Social:  The patient lives in the home with his spouse.  She is supportive.  He states that he is independent with his ADLS/IADLS.  He has transportation to and from his appointments.  He denies any pain or falls today.  The durable medical equipment in the home consist of: CBG meter, Cane, Blood pressure cuff, and scale.  Conditions: Per chart review and speaking with the patient his conditions include:  HTN, CHF, GERD, Type II diabetes, Hypothyroidism, Renal Insufficiency, Hyperlipidemia, Gout, and Hyponatremia.  The patient was referred to me for diabetes.  The patient states that he checks his blood sugar once weekly.  He states that he has never had a low BS or very high BS.  He states that his blood sugar last week was 129.  He states that his a1c was 6.3.  He has a follow up appointment every six months.  Medications: Per chart review and conversation with the patient he takes sixteen medications.  He is able to manage them.  He did not express any problems for purchasing his medications  Appointments: the patient states that he goes to the [place] but was unable to tell me when his next appointment will be.   Advanced Directives:  The patient states that he has a health care power of attorney and a living will.   Current Medications:  Current Outpatient Medications  Medication Sig Dispense Refill  . acetaminophen (TYLENOL) 325 MG tablet Take 650 mg by mouth every 6 (six) hours as needed for moderate pain.    Marland Kitchen allopurinol (ZYLOPRIM) 300 MG tablet Take 300 mg by mouth daily.     Marland Kitchen aspirin EC 81 MG tablet Take 81 mg by mouth daily.    . cetirizine (ZYRTEC) 10 MG tablet Take 10 mg by mouth daily.    . Cholecalciferol (VITAMIN D PO) Take 5,000 Units by mouth daily.      . Coenzyme Q10 (CO Q 10 PO) Take 1 capsule by mouth daily.     . cyclobenzaprine (FLEXERIL) 10 MG tablet Take 1 tablet (10 mg total) by mouth 3 (three) times daily as needed for muscle spasms. 30 tablet 0  . doxazosin (CARDURA) 4 MG tablet Take 1 tablet (4 mg total) by mouth at bedtime. 90 tablet 1  . furosemide (LASIX) 40 MG tablet Take 1 tablet (40 mg total) by mouth 2 (two) times daily. 180 tablet 2  . gabapentin (NEURONTIN) 300 MG capsule Take 300 mg by mouth daily as needed (pain).     Marland Kitchen KETOCONAZOLE, TOPICAL, 1 % SHAM Apply 1 application topically See admin instructions. Every 2-3 days when he washes hair.     Marland Kitchen levothyroxine (SYNTHROID, LEVOTHROID) 50 MCG tablet Take 50 mcg by mouth daily before breakfast.    . Menthol-Methyl Salicylate (THERA-GESIC EX) Apply 1 application topically daily as needed (knee pain.).    Marland Kitchen metFORMIN (GLUCOPHAGE) 500 MG tablet Take 500 mg by mouth 2 (two) times daily with a meal.    . metoprolol (LOPRESSOR) 50 MG tablet Take 50 mg by mouth 2 (two) times daily.    . mirtazapine (REMERON) 15 MG tablet Take 15 mg by mouth at bedtime.  11  . Multiple Vitamin (MULTIVITAMIN WITH MINERALS) TABS tablet Take 1  tablet by mouth daily. PACKET    . Omega-3 Fatty Acids (FISH OIL) 1000 MG CAPS Take 1 capsule by mouth daily.     Marland Kitchen omeprazole (PRILOSEC) 20 MG capsule Take 20 mg by mouth daily.     . potassium chloride (K-DUR) 10 MEQ tablet Take 10 mEq by mouth daily.    . prazosin (MINIPRESS) 2 MG capsule Take 2 mg by mouth as needed.    Marland Kitchen QUEtiapine (SEROQUEL XR) 50 MG TB24 24 hr tablet Take 50 mg by mouth at bedtime. Patient states that takes 2    . Red Yeast Rice Extract (RED YEAST RICE PO) Take 1 capsule by mouth at bedtime.     . Saw Palmetto, Serenoa repens, (SAW PALMETTO PO) Take 1 capsule by mouth daily.    . temazepam (RESTORIL) 15 MG capsule Take 15 mg by mouth at bedtime as needed. Sleep    . triamcinolone cream (KENALOG) 0.1 % Apply 1 application topically daily as  needed (rosacea).     . ondansetron (ZOFRAN) 4 MG tablet Take 1 tablet (4 mg total) by mouth every 8 (eight) hours as needed for nausea or vomiting. (Patient not taking: Reported on 05/31/2018) 10 tablet 0  . oxyCODONE-acetaminophen (PERCOCET) 5-325 MG tablet Take 1 tablet by mouth every 4 (four) hours as needed (max 6 q). (Patient not taking: Reported on 10/09/2017) 42 tablet 0   No current facility-administered medications for this visit.     Functional Status:  In your present state of health, do you have any difficulty performing the following activities: 05/31/2018 09/25/2017  Hearing? N N  Vision? N N  Difficulty concentrating or making decisions? N N  Walking or climbing stairs? Y Y  Comment left knee needds to be replaced left knee.   Dressing or bathing? N N  Doing errands, shopping? N N  Some recent data might be hidden    Fall/Depression Screening: Fall Risk  05/31/2018 05/21/2013 03/06/2012  Falls in the past year? 0 No No   PHQ 2/9 Scores 05/31/2018 05/21/2013 03/06/2012  PHQ - 2 Score 0 0 0    Assessment: Patient will benefit from health coach outreach for disease management and support.   THN CM Care Plan Problem One     Most Recent Value  Care Plan Problem One  knowledge deficit related to diease management of diabetes  Role Documenting the Problem One  Health Coach  Care Plan for Problem One  Active  THN Long Term Goal   In 30 days the patient will maintain an a1c of 6.3  THN Long Term Goal Start Date  05/31/18  Interventions for Problem One Long Term Goal  Discussed FBS levels, disccussed diet, encouraged patient to check blood sugars, reveiwed medications and encouraged compliance      Plan:  [place] will provide ongoing education for patient on diabetes through phone calls and sending printed information to patient for further discussion.  RN Health Coach will send printed information on diabetes.  RN Health Coach will send initial barriers letter, assessment,  and care plan to primary care physician. RN Health Coach will contact patient in the month of May and patient agrees to next outreach.   [name] RN, BSN, [place] Direct Dial:  [phone]  Fax: [phone]

## 2018-07-01 ENCOUNTER — Other Ambulatory Visit: Payer: Self-pay

## 2018-07-01 NOTE — Patient Outreach (Signed)
[place] [facility]) Care Management  07/01/2018   Terry Lyons [birth year] [id number]  Subjective: Successful outreach. Two patient identifiers obtained.  The patient denies any pain or falls.  He states that he received the information that I had mailed to him.  He haas not had time to review it yet.  Encouraged the patient to review the information and call me with any questions.  The patient states that his blood sugars have been ranging between 128-147.  He states that he is taking his medications as prescribe.  He receives his medications in the mail from the [place].  The patient states that he is stocked well with  Food and rarely has to leave the home.  Discussed precautionary measures for covid -19.  Patient verbalized understanding.     Current Medications:  Current Outpatient Medications  Medication Sig Dispense Refill  . acetaminophen (TYLENOL) 325 MG tablet Take 650 mg by mouth every 6 (six) hours as needed for moderate pain.    Marland Kitchen allopurinol (ZYLOPRIM) 300 MG tablet Take 300 mg by mouth daily.     Marland Kitchen aspirin EC 81 MG tablet Take 81 mg by mouth daily.    . cetirizine (ZYRTEC) 10 MG tablet Take 10 mg by mouth daily.    . Cholecalciferol (VITAMIN D PO) Take 5,000 Units by mouth daily.     . Coenzyme Q10 (CO Q 10 PO) Take 1 capsule by mouth daily.     . cyclobenzaprine (FLEXERIL) 10 MG tablet Take 1 tablet (10 mg total) by mouth 3 (three) times daily as needed for muscle spasms. 30 tablet 0  . doxazosin (CARDURA) 4 MG tablet Take 1 tablet (4 mg total) by mouth at bedtime. 90 tablet 1  . furosemide (LASIX) 40 MG tablet Take 1 tablet (40 mg total) by mouth 2 (two) times daily. 180 tablet 2  . gabapentin (NEURONTIN) 300 MG capsule Take 300 mg by mouth daily as needed (pain).     Marland Kitchen KETOCONAZOLE, TOPICAL, 1 % SHAM Apply 1 application topically See admin instructions. Every 2-3 days when he washes hair.     Marland Kitchen levothyroxine (SYNTHROID, LEVOTHROID) 50 MCG tablet Take 50 mcg by mouth daily  before breakfast.    . Menthol-Methyl Salicylate (THERA-GESIC EX) Apply 1 application topically daily as needed (knee pain.).    Marland Kitchen metFORMIN (GLUCOPHAGE) 500 MG tablet Take 500 mg by mouth 2 (two) times daily with a meal.    . mirtazapine (REMERON) 15 MG tablet Take 15 mg by mouth at bedtime.  11  . Multiple Vitamin (MULTIVITAMIN WITH MINERALS) TABS tablet Take 1 tablet by mouth daily. PACKET    . Omega-3 Fatty Acids (FISH OIL) 1000 MG CAPS Take 1 capsule by mouth daily.     Marland Kitchen omeprazole (PRILOSEC) 20 MG capsule Take 20 mg by mouth daily.     Marland Kitchen oxyCODONE-acetaminophen (PERCOCET) 5-325 MG tablet Take 1 tablet by mouth every 4 (four) hours as needed (max 6 q). 42 tablet 0  . potassium chloride (K-DUR) 10 MEQ tablet Take 10 mEq by mouth daily.    . prazosin (MINIPRESS) 2 MG capsule Take 2 mg by mouth as needed.    Marland Kitchen QUEtiapine (SEROQUEL XR) 50 MG TB24 24 hr tablet Take 50 mg by mouth at bedtime. Patient states that takes 2    . Red Yeast Rice Extract (RED YEAST RICE PO) Take 1 capsule by mouth at bedtime.     . Saw Palmetto, Serenoa repens, (SAW PALMETTO PO) Take 1  capsule by mouth daily.    . temazepam (RESTORIL) 15 MG capsule Take 15 mg by mouth at bedtime as needed. Sleep    . triamcinolone cream (KENALOG) 0.1 % Apply 1 application topically daily as needed (rosacea).     . metoprolol (LOPRESSOR) 50 MG tablet Take 50 mg by mouth 2 (two) times daily.    . ondansetron (ZOFRAN) 4 MG tablet Take 1 tablet (4 mg total) by mouth every 8 (eight) hours as needed for nausea or vomiting. (Patient not taking: Reported on 05/31/2018) 10 tablet 0   No current facility-administered medications for this visit.     Functional Status:  In your present state of health, do you have any difficulty performing the following activities: 05/31/2018 09/25/2017  Hearing? N N  Vision? N N  Difficulty concentrating or making decisions? N N  Walking or climbing stairs? Y Y  Comment left knee needds to be replaced left  knee.   Dressing or bathing? N N  Doing errands, shopping? N N  Some recent data might be hidden    Fall/Depression Screening: Fall Risk  07/01/2018 05/31/2018 05/21/2013  Falls in the past year? 0 0 No   PHQ 2/9 Scores 05/31/2018 05/21/2013 03/06/2012  PHQ - 2 Score 0 0 0    Assessment: Patient will continue to benefit from health coach outreach for disease management and support. THN CM Care Plan Problem One     Most Recent Value  THN Long Term Goal   In 90 days the patient will maintain an a1c of 6.3  THN Long Term Goal Start Date  07/01/18  Interventions for Problem One Long Term Goal  Discussed FBS, medication adherence and covid-19 precaution measures.     Plan: RN Health Coach will contact patient in the month of August and patient agrees to next outreach.  [name] RN, BSN, [place] Direct Dial:  [phone]  Fax: [phone]

## 2018-08-08 ENCOUNTER — Other Ambulatory Visit: Payer: Self-pay

## 2018-08-08 NOTE — Patient Outreach (Signed)
[place] [facility]) Care Management  08/08/2018  Terry Lyons [birth year] [id number]    RN Health Coach closing the program.  Patient is transitioning to external program [facility] for continued case management.   [name] RN, BSN, [place] Direct Dial:  [phone]  Fax: [phone]

## 2018-09-06 DIAGNOSIS — D225 Melanocytic nevi of trunk: Secondary | ICD-10-CM | POA: Diagnosis not present

## 2018-09-06 DIAGNOSIS — L570 Actinic keratosis: Secondary | ICD-10-CM | POA: Diagnosis not present

## 2018-09-06 DIAGNOSIS — L821 Other seborrheic keratosis: Secondary | ICD-10-CM | POA: Diagnosis not present

## 2018-09-06 DIAGNOSIS — L298 Other pruritus: Secondary | ICD-10-CM | POA: Diagnosis not present

## 2018-09-16 DIAGNOSIS — E1169 Type 2 diabetes mellitus with other specified complication: Secondary | ICD-10-CM | POA: Diagnosis not present

## 2018-09-16 DIAGNOSIS — E039 Hypothyroidism, unspecified: Secondary | ICD-10-CM | POA: Diagnosis not present

## 2018-09-16 DIAGNOSIS — R609 Edema, unspecified: Secondary | ICD-10-CM | POA: Diagnosis not present

## 2018-09-16 DIAGNOSIS — I110 Hypertensive heart disease with heart failure: Secondary | ICD-10-CM | POA: Diagnosis not present

## 2018-09-16 DIAGNOSIS — M109 Gout, unspecified: Secondary | ICD-10-CM | POA: Diagnosis not present

## 2018-09-16 DIAGNOSIS — I5032 Chronic diastolic (congestive) heart failure: Secondary | ICD-10-CM | POA: Diagnosis not present

## 2018-09-16 DIAGNOSIS — F419 Anxiety disorder, unspecified: Secondary | ICD-10-CM | POA: Diagnosis not present

## 2018-09-16 DIAGNOSIS — F4310 Post-traumatic stress disorder, unspecified: Secondary | ICD-10-CM | POA: Diagnosis not present

## 2018-09-18 DIAGNOSIS — M720 Palmar fascial fibromatosis [Dupuytren]: Secondary | ICD-10-CM | POA: Diagnosis not present

## 2018-09-18 DIAGNOSIS — M19042 Primary osteoarthritis, left hand: Secondary | ICD-10-CM | POA: Diagnosis not present

## 2018-09-18 DIAGNOSIS — M79642 Pain in left hand: Secondary | ICD-10-CM | POA: Diagnosis not present

## 2018-10-01 ENCOUNTER — Ambulatory Visit: Payer: PPO

## 2018-10-18 DIAGNOSIS — E1129 Type 2 diabetes mellitus with other diabetic kidney complication: Secondary | ICD-10-CM | POA: Diagnosis not present

## 2018-10-18 DIAGNOSIS — Z23 Encounter for immunization: Secondary | ICD-10-CM | POA: Diagnosis not present

## 2018-10-21 ENCOUNTER — Encounter: Payer: Self-pay | Admitting: Internal Medicine

## 2018-11-08 ENCOUNTER — Encounter: Payer: Self-pay | Admitting: Internal Medicine

## 2018-12-12 ENCOUNTER — Encounter: Payer: PPO | Admitting: Internal Medicine

## 2019-01-01 ENCOUNTER — Encounter: Payer: Self-pay | Admitting: Internal Medicine

## 2019-01-01 ENCOUNTER — Ambulatory Visit: Payer: PPO | Admitting: Internal Medicine

## 2019-01-01 VITALS — BP 116/70 | HR 80 | Temp 97.4°F | Ht 67.0 in | Wt 290.0 lb

## 2019-01-01 DIAGNOSIS — R195 Other fecal abnormalities: Secondary | ICD-10-CM

## 2019-01-01 DIAGNOSIS — K2100 Gastro-esophageal reflux disease with esophagitis, without bleeding: Secondary | ICD-10-CM

## 2019-01-01 DIAGNOSIS — R1310 Dysphagia, unspecified: Secondary | ICD-10-CM | POA: Diagnosis not present

## 2019-01-01 DIAGNOSIS — R1319 Other dysphagia: Secondary | ICD-10-CM

## 2019-01-01 DIAGNOSIS — Z1159 Encounter for screening for other viral diseases: Secondary | ICD-10-CM | POA: Diagnosis not present

## 2019-01-01 DIAGNOSIS — Z86010 Personal history of colonic polyps: Secondary | ICD-10-CM

## 2019-01-01 MED ORDER — NA SULFATE-K SULFATE-MG SULF 17.5-3.13-1.6 GM/177ML PO SOLN: 1.0000 | Freq: Once | ORAL | 0 refills | Status: AC

## 2019-01-01 NOTE — Progress Notes (Signed)
HISTORY OF PRESENT ILLNESS:  Terry Lyons is a 73 y.o. male with multiple significant medical problems including diabetes mellitus, congestive heart failure (ejection fraction 65 to 70%), hypertension, hyperlipidemia, and morbid obesity who presents today regarding surveillance colonoscopy, GERD, and recurrent dysphagia.  The patient was last seen in this office April 19, 2015.  He was here regarding follow-up of questionable abnormality on CT scan.  Subsequent endoscopic ultrasound was unremarkable regarding potential duodenal abnormality.  This was performed by Dr. Ardis Hughs May 20, 2015.  The patient does have a history of peptic stricture.  For his GERD he is maintained on omeprazole 20 mg once daily.  He has rare breakthrough symptoms.  He has been experiencing intermittent solid food dysphagia for 18 months.  Slightly progressive.  Has not had to vomit or regurgitate his food.  He is also due for surveillance colonoscopy.  Last colonoscopy September 2015.  Does have a history of multiple adenomas with previous colonoscopies 2009 and 2012, as well.  Also advanced adenoma (10 mm).  1 adenoma at the time of his last exam.  Otherwise normal exam.  He does report to me longtime problems with bowel habits tending toward constipation, typically 1 movement every 3 to 4 days.  That has continued.  However occasionally he will notice loose stools after having had solid stools.  He is on metformin.  No bleeding.  Review of CT scan January 2017 as previously described.  No interval blood work for review.  He is accompanied by his wife Remo Lipps  REVIEW OF SYSTEMS:  All non-GI ROS negative unless otherwise stated in the HPI except for arthritis  Past Medical History:  Diagnosis Date  . Allergy   . Arthritis   . CHF (congestive heart failure) ([place])    hospitalized in 2017; was diagnosed with an acute excacerbation.   . Colon polyps    adenomatous  . Diabetes mellitus   . GERD (gastroesophageal reflux disease)   .  Gout   . History of basal cell carcinoma    [facility]  . History of squamous cell carcinoma   . Hyperlipidemia   . Hypertension   . Obesity   . Rosacea   . Tachycardia     Past Surgical History:  Procedure Laterality Date  . colonoscopy with polypectomy     X3  . ESOPHAGOGASTRODUODENOSCOPY N/A 03/10/2015   Procedure: ESOPHAGOGASTRODUODENOSCOPY (EGD);  Surgeon: Ladene Artist, MD;  Location: [facility];  Service: Endoscopy;  Laterality: N/A;  . esophagus dilated     X3  . EUS N/A 05/20/2015   Procedure: UPPER ENDOSCOPIC ULTRASOUND (EUS) RADIAL;  Surgeon: Milus Banister, MD;  Location: [facility];  Service: Endoscopy;  Laterality: N/A;  . KNEE ARTHROSCOPY     bilaterally  . PILONIDAL CYST EXCISION    . REPLACEMENT TOTAL KNEE Right    R  . SHOULDER SURGERY     right shoulder  . TONSILLECTOMY    . total ethmoidectomy & sphenoidectomiy Bilateral 05/27/13   also reduction of turbinates  . TOTAL KNEE ARTHROPLASTY     RIGHT  . TOTAL SHOULDER ARTHROPLASTY Left 10/04/2017   Procedure: LEFT TOTAL SHOULDER ARTHROPLASTY;  Surgeon: Justice Britain, MD;  Location: [facility];  Service: Orthopedics;  Laterality: Left;    Social History DOUGLES TILLERSON  reports that he quit smoking about 45 years ago. His smoking use included cigarettes. He has never used smokeless tobacco. He reports that he does not drink alcohol or use drugs.  family history includes Heart attack (age of onset: 35) in his brother; Heart attack (age of onset: 43) in his paternal grandfather; Heart attack (age of onset: 56) in his paternal grandmother; Heart disease in his father; Lung cancer in his maternal aunt and mother.  Allergies  Allergen Reactions  . Sulfa Antibiotics     UNSPECIFIED REACTION   . Bupropion Hcl Other (See Comments)    REACTION: insomnia  . Codeine Nausea Only  . Escitalopram Oxalate Other (See Comments)    REACTION: insomnia  . Sertraline Hcl Other (See Comments)    REACTION: insomnia        PHYSICAL EXAMINATION: Vital signs: BP 116/70   Pulse 80   Temp (!) 97.4 F (36.3 C)   Ht 5\' 7"  (1.702 m)   Wt 290 lb (131.5 kg)   BMI 45.42 kg/m   Constitutional: Obese, generally well-appearing, no acute distress Psychiatric: alert and oriented x3, cooperative Eyes: extraocular movements intact, anicteric, conjunctiva pink Mouth: Mask Neck: supple no lymphadenopathy Cardiovascular: heart regular rate and rhythm, no murmur Lungs: clear to auscultation bilaterally Abdomen: soft, nontender, nondistended, no obvious ascites, no peritoneal signs, normal bowel sounds, no organomegaly Rectal: Deferred until colonoscopy Extremities: no clubbing or cyanosis.  1+ lower extremity edema bilaterally Skin: no lesions on visible extremities Neuro: No focal deficits.  Cranial nerves intact  ASSESSMENT:  1.  GERD with recurrent intermittent solid food dysphagia.  Rule out peptic stricture 2.  History of multiple and advanced adenomas.  Last examination September 2015.  Due for surveillance 3.  Morbid obesity 4.  Multiple medical problems 5.  Occasional loose stool secondary to metformin   PLAN:  1.  Reflux precautions 2.  Weight loss 3.  Continue omeprazole 20 mg daily.  Medication risks reviewed 4.  Schedule upper endoscopy with esophageal dilation.  The patient is HIGH RISK given his comorbidities and obesity.  He will need monitored anesthesia care for his procedure.The nature of the procedure, as well as the risks, benefits, and alternatives were carefully and thoroughly reviewed with the patient. Ample time for discussion and questions allowed. The patient understood, was satisfied, and agreed to proceed. 5.  Schedule surveillance colonoscopy.  The patient is high risk as above.The nature of the procedure, as well as the risks, benefits, and alternatives were carefully and thoroughly reviewed with the patient. Ample time for discussion and questions allowed. The patient understood, was  satisfied, and agreed to proceed.

## 2019-01-01 NOTE — Patient Instructions (Signed)
If you are age 75 or older, your body mass index should be between 23-30. Your Body mass index is 45.42 kg/m. If this is out of the aforementioned range listed, please consider follow up with your Primary Care Provider.  If you are age 28 or younger, your body mass index should be between 19-25. Your Body mass index is 45.42 kg/m. If this is out of the aformentioned range listed, please consider follow up with your Primary Care Provider.   You have been scheduled for an endoscopy and colonoscopy. Please follow the written instructions given to you at your visit today. Please pick up your prep supplies at the pharmacy within the next 1-3 days. If you use inhalers (even only as needed), please bring them with you on the day of your procedure.  It was a pleasure to see you today!

## 2019-01-20 ENCOUNTER — Ambulatory Visit (INDEPENDENT_AMBULATORY_CARE_PROVIDER_SITE_OTHER): Payer: PPO

## 2019-01-20 ENCOUNTER — Other Ambulatory Visit: Payer: Self-pay | Admitting: Internal Medicine

## 2019-01-20 DIAGNOSIS — Z8589 Personal history of malignant neoplasm of other organs and systems: Secondary | ICD-10-CM | POA: Insufficient documentation

## 2019-01-20 DIAGNOSIS — Z1159 Encounter for screening for other viral diseases: Secondary | ICD-10-CM | POA: Diagnosis not present

## 2019-01-21 LAB — SARS CORONAVIRUS 2 (TAT 6-24 HRS): SARS Coronavirus 2: NEGATIVE

## 2019-01-22 ENCOUNTER — Ambulatory Visit (AMBULATORY_SURGERY_CENTER): Payer: PPO | Admitting: Internal Medicine

## 2019-01-22 ENCOUNTER — Other Ambulatory Visit: Payer: Self-pay

## 2019-01-22 ENCOUNTER — Encounter: Payer: Self-pay | Admitting: Internal Medicine

## 2019-01-22 VITALS — BP 119/54 | HR 67 | Temp 98.6°F | Resp 23 | Ht 67.0 in | Wt 290.0 lb

## 2019-01-22 DIAGNOSIS — K2100 Gastro-esophageal reflux disease with esophagitis, without bleeding: Secondary | ICD-10-CM | POA: Diagnosis not present

## 2019-01-22 DIAGNOSIS — D124 Benign neoplasm of descending colon: Secondary | ICD-10-CM | POA: Diagnosis not present

## 2019-01-22 DIAGNOSIS — R1319 Other dysphagia: Secondary | ICD-10-CM

## 2019-01-22 DIAGNOSIS — Z86010 Personal history of colonic polyps: Secondary | ICD-10-CM | POA: Diagnosis not present

## 2019-01-22 DIAGNOSIS — R1310 Dysphagia, unspecified: Secondary | ICD-10-CM

## 2019-01-22 DIAGNOSIS — D125 Benign neoplasm of sigmoid colon: Secondary | ICD-10-CM | POA: Diagnosis not present

## 2019-01-22 DIAGNOSIS — D123 Benign neoplasm of transverse colon: Secondary | ICD-10-CM | POA: Diagnosis not present

## 2019-01-22 DIAGNOSIS — K635 Polyp of colon: Secondary | ICD-10-CM | POA: Diagnosis not present

## 2019-01-22 DIAGNOSIS — R195 Other fecal abnormalities: Secondary | ICD-10-CM

## 2019-01-22 MED ORDER — SODIUM CHLORIDE 0.9 % IV SOLN: 500.0000 mL | Freq: Once | INTRAVENOUS | Status: DC

## 2019-01-22 NOTE — Progress Notes (Signed)
Temp  Jr Vitals cw

## 2019-01-22 NOTE — Progress Notes (Signed)
Pt Drowsy. VSS. To PACU, report to RN. No anesthetic complications noted.  

## 2019-01-22 NOTE — Progress Notes (Signed)
Called to room to assist during endoscopic procedure.  Patient ID and intended procedure confirmed with present staff. Received instructions for my participation in the procedure from the performing physician.  

## 2019-01-22 NOTE — Op Note (Signed)
[facility] Patient Name: Terry Lyons Procedure Date: 01/22/2019 12:57 PM MRN: [id number] Endoscopist: Docia Chuck. Henrene Pastor , MD Age: 73 Referring MD:  Date of Birth: 1946-01-08 Gender: Male Account #: [id number] Procedure:                Upper GI endoscopy with [facility] dilation of the                            esophagus. 44 French Indications:              Dysphagia Medicines:                Monitored Anesthesia Care Procedure:                Pre-Anesthesia Assessment:                           - Prior to the procedure, a History and Physical                            was performed, and patient medications and                            allergies were reviewed. The patient's tolerance of                            previous anesthesia was also reviewed. The risks                            and benefits of the procedure and the sedation                            options and risks were discussed with the patient.                            All questions were answered, and informed consent                            was obtained. Prior Anticoagulants: The patient has                            taken no previous anticoagulant or antiplatelet                            agents. ASA Grade Assessment: II - A patient with                            mild systemic disease. After reviewing the risks                            and benefits, the patient was deemed in                            satisfactory condition to undergo the procedure.  After obtaining informed consent, the endoscope was                            passed under direct vision. Throughout the                            procedure, the patient's blood pressure, pulse, and                            oxygen saturations were monitored continuously. The                            Endoscope was introduced through the mouth, and                            advanced to the second part of duodenum. The upper                          GI endoscopy was accomplished without difficulty.                            The patient tolerated the procedure well. Scope In: Scope Out: Findings:                 The esophagus revealed a partial ring at the                            gastroesophageal junction but was otherwise                            entirely normal. The scope was withdrawn after                            completing the entire endoscopic survey. Dilation                            was performed with a Maloney dilator with no                            resistance at 3 Fr.                           The stomach was normal.                           The examined duodenum was normal.                           The cardia and gastric fundus were normal on                            retroflexion. Complications:            No immediate complications. Estimated Blood Loss:     Estimated blood loss: none. Impression:               -  Partial stricture: Otherwise normal esophagus.                            Dilated.                           - Normal stomach.                           - Normal examined duodenum.                           - No specimens collected. Recommendation:           - Patient has a contact number available for                            emergencies. The signs and symptoms of potential                            delayed complications were discussed with the                            patient. Return to normal activities tomorrow.                            Written discharge instructions were provided to the                            patient.                           - Post dilation diet.                           - Continue present medications. Docia Chuck. Henrene Pastor, MD 01/22/2019 1:58:15 PM This report has been signed electronically.

## 2019-01-22 NOTE — Op Note (Signed)
[facility] Patient Name: Terry Lyons Procedure Date: 01/22/2019 12:57 PM MRN: [id number] Endoscopist: Docia Chuck. Henrene Pastor , MD Age: 73 Referring MD:  Date of Birth: 26-Dec-1945 Gender: Male Account #: [id number] Procedure:                Colonoscopy with cold snare polypectomy x 4 Indications:              High risk colon cancer surveillance: Personal                            history of adenoma (10 mm or greater in size), High                            risk colon cancer surveillance: Personal history of                            multiple (3 or more) adenomas. Previous                            examinations 2009, 2012, 2015 Medicines:                Monitored Anesthesia Care Procedure:                Pre-Anesthesia Assessment:                           - Prior to the procedure, a History and Physical                            was performed, and patient medications and                            allergies were reviewed. The patient's tolerance of                            previous anesthesia was also reviewed. The risks                            and benefits of the procedure and the sedation                            options and risks were discussed with the patient.                            All questions were answered, and informed consent                            was obtained. Prior Anticoagulants: The patient has                            taken no previous anticoagulant or antiplatelet                            agents. ASA Grade Assessment: II - A patient with  mild systemic disease. After reviewing the risks                            and benefits, the patient was deemed in                            satisfactory condition to undergo the procedure.                           After obtaining informed consent, the colonoscope                            was passed under direct vision. Throughout the                            procedure, the  patient's blood pressure, pulse, and                            oxygen saturations were monitored continuously. The                            Colonoscope was introduced through the anus and                            advanced to the the cecum, identified by                            appendiceal orifice and ileocecal valve. The                            ileocecal valve, appendiceal orifice, and rectum                            were photographed. The quality of the bowel                            preparation was good. The colonoscopy was performed                            without difficulty. The patient tolerated the                            procedure well. The bowel preparation used was                            SUPREP via split dose instruction. Scope In: 1:20:31 PM Scope Out: 1:39:59 PM Scope Withdrawal Time: 0 hours 17 minutes 11 seconds  Total Procedure Duration: 0 hours 19 minutes 28 seconds  Findings:                 Four polyps were found in the sigmoid colon,                            descending colon and transverse colon. The polyps  were 2 to 4 mm in size. These polyps were removed                            with a cold snare. Resection and retrieval were                            complete.                           The terminal ileum was normal. The exam was                            otherwise without abnormality on direct and                            retroflexion views. Internal hemorrhoids present Complications:            No immediate complications. Estimated blood loss:                            None. Estimated Blood Loss:     Estimated blood loss: none. Impression:               - Four 2 to 4 mm polyps in the sigmoid colon, in                            the descending colon and in the transverse colon,                            removed with a cold snare. Resected and retrieved.                           - Normal terminal ileum                            - Internal hemorrhoids                           - The examination was otherwise normal on direct                            and retroflexion views. Recommendation:           - Repeat colonoscopy in 5 years for surveillance,                            could be considered if medically fit and well.                           - Patient has a contact number available for                            emergencies. The signs and symptoms of potential  delayed complications were discussed with the                            patient. Return to normal activities tomorrow.                            Written discharge instructions were provided to the                            patient.                           - Resume previous diet.                           - Continue present medications.                           - Await pathology results. Docia Chuck. Henrene Pastor, MD 01/22/2019 1:45:53 PM This report has been signed electronically.

## 2019-01-22 NOTE — Patient Instructions (Signed)
Please read handouts provided. Follow Dilation Diet. Continue present medications. Await pathology results.       YOU HAD AN ENDOSCOPIC PROCEDURE TODAY AT [facility]:   Refer to the procedure report that was given to you for any specific questions about what was found during the examination.  If the procedure report does not answer your questions, please call your gastroenterologist to clarify.  If you requested that your care partner not be given the details of your procedure findings, then the procedure report has been included in a sealed envelope for you to review at your convenience later.  YOU SHOULD EXPECT: Some feelings of bloating in the abdomen. Passage of more gas than usual.  Walking can help get rid of the air that was put into your GI tract during the procedure and reduce the bloating. If you had a lower endoscopy (such as a colonoscopy or flexible sigmoidoscopy) you may notice spotting of blood in your stool or on the toilet paper. If you underwent a bowel prep for your procedure, you may not have a normal bowel movement for a few days.  Please Note:  You might notice some irritation and congestion in your nose or some drainage.  This is from the oxygen used during your procedure.  There is no need for concern and it should clear up in a day or so.  SYMPTOMS TO REPORT IMMEDIATELY:   Following lower endoscopy (colonoscopy or flexible sigmoidoscopy):  Excessive amounts of blood in the stool  Significant tenderness or worsening of abdominal pains  Swelling of the abdomen that is new, acute  Fever of 100F or higher   Following upper endoscopy (EGD)  Vomiting of blood or coffee ground material  New chest pain or pain under the shoulder blades  Painful or persistently difficult swallowing  New shortness of breath  Fever of 100F or higher  Black, tarry-looking stools  For urgent or emergent issues, a gastroenterologist can be reached at any hour by  calling [phone].   DIET:  Drink plenty of fluids but you should avoid alcoholic beverages for 24 hours.  ACTIVITY:  You should plan to take it easy for the rest of today and you should NOT DRIVE or use heavy machinery until tomorrow (because of the sedation medicines used during the test).    FOLLOW UP: Our staff will call the number listed on your records 48-72 hours following your procedure to check on you and address any questions or concerns that you may have regarding the information given to you following your procedure. If we do not reach you, we will leave a message.  We will attempt to reach you two times.  During this call, we will ask if you have developed any symptoms of COVID 19. If you develop any symptoms (ie: fever, flu-like symptoms, shortness of breath, cough etc.) before then, please call [phone].  If you test positive for Covid 19 in the 2 weeks post procedure, please call and report this information to [place].    If any biopsies were taken you will be contacted by phone or by letter within the next 1-3 weeks.  Please call us at [phone] if you have not heard about the biopsies in 3 weeks.    SIGNATURES/CONFIDENTIALITY: You and/or your care partner have signed paperwork which will be entered into your electronic medical record.  These signatures attest to the fact that that the information above on your After Visit Summary has been reviewed and  is understood.  Full responsibility of the confidentiality of this discharge information lies with you and/or your care-partner.

## 2019-01-24 ENCOUNTER — Telehealth: Payer: Self-pay | Admitting: *Deleted

## 2019-01-24 NOTE — Telephone Encounter (Signed)
  Follow up Call-  Call back number 01/22/2019  Post procedure Call Back phone  # [phone]  Permission to leave phone message Yes  Some recent data might be hidden     Patient questions:  Do you have a fever, pain , or abdominal swelling? No. Pain Score  0 *  Have you tolerated food without any problems? Yes.    Have you been able to return to your normal activities? Yes.    Do you have any questions about your discharge instructions: Diet   No. Medications  No. Follow up visit  No.  Do you have questions or concerns about your Care? No.  Actions: * If pain score is 4 or above: No action needed, pain <4.  1. Have you developed a fever since your procedure? no  2.   Have you had an respiratory symptoms (SOB or cough) since your procedure? no  3.   Have you tested positive for COVID 19 since your procedure no  4.   Have you had any family members/close contacts diagnosed with the COVID 19 since your procedure?  no   If yes to any of these questions please route to Joylene John, RN and Alphonsa Gin, Therapist, sports.

## 2019-01-24 NOTE — Telephone Encounter (Signed)
Follow up call made. Left message

## 2019-01-27 ENCOUNTER — Encounter: Payer: Self-pay | Admitting: Internal Medicine

## 2019-02-17 DIAGNOSIS — E7849 Other hyperlipidemia: Secondary | ICD-10-CM | POA: Diagnosis not present

## 2019-02-17 DIAGNOSIS — E669 Obesity, unspecified: Secondary | ICD-10-CM | POA: Diagnosis not present

## 2019-02-17 DIAGNOSIS — E1169 Type 2 diabetes mellitus with other specified complication: Secondary | ICD-10-CM | POA: Diagnosis not present

## 2019-02-17 DIAGNOSIS — E291 Testicular hypofunction: Secondary | ICD-10-CM | POA: Diagnosis not present

## 2019-02-17 DIAGNOSIS — E559 Vitamin D deficiency, unspecified: Secondary | ICD-10-CM | POA: Diagnosis not present

## 2019-02-17 DIAGNOSIS — Z125 Encounter for screening for malignant neoplasm of prostate: Secondary | ICD-10-CM | POA: Diagnosis not present

## 2019-02-17 DIAGNOSIS — E038 Other specified hypothyroidism: Secondary | ICD-10-CM | POA: Diagnosis not present

## 2019-02-24 DIAGNOSIS — R809 Proteinuria, unspecified: Secondary | ICD-10-CM | POA: Diagnosis not present

## 2019-02-24 DIAGNOSIS — E039 Hypothyroidism, unspecified: Secondary | ICD-10-CM | POA: Diagnosis not present

## 2019-02-24 DIAGNOSIS — I130 Hypertensive heart and chronic kidney disease with heart failure and stage 1 through stage 4 chronic kidney disease, or unspecified chronic kidney disease: Secondary | ICD-10-CM | POA: Diagnosis not present

## 2019-02-24 DIAGNOSIS — R609 Edema, unspecified: Secondary | ICD-10-CM | POA: Diagnosis not present

## 2019-02-24 DIAGNOSIS — E1129 Type 2 diabetes mellitus with other diabetic kidney complication: Secondary | ICD-10-CM | POA: Diagnosis not present

## 2019-02-24 DIAGNOSIS — E1169 Type 2 diabetes mellitus with other specified complication: Secondary | ICD-10-CM | POA: Diagnosis not present

## 2019-02-24 DIAGNOSIS — I5032 Chronic diastolic (congestive) heart failure: Secondary | ICD-10-CM | POA: Diagnosis not present

## 2019-02-24 DIAGNOSIS — R011 Cardiac murmur, unspecified: Secondary | ICD-10-CM | POA: Diagnosis not present

## 2019-02-24 DIAGNOSIS — E785 Hyperlipidemia, unspecified: Secondary | ICD-10-CM | POA: Diagnosis not present

## 2019-02-24 DIAGNOSIS — Z1331 Encounter for screening for depression: Secondary | ICD-10-CM | POA: Diagnosis not present

## 2019-02-24 DIAGNOSIS — Q791 Other congenital malformations of diaphragm: Secondary | ICD-10-CM | POA: Diagnosis not present

## 2019-02-24 DIAGNOSIS — Z0000 Encounter for general adult medical examination without abnormal findings: Secondary | ICD-10-CM | POA: Diagnosis not present

## 2019-02-24 DIAGNOSIS — M109 Gout, unspecified: Secondary | ICD-10-CM | POA: Diagnosis not present

## 2019-02-24 DIAGNOSIS — N183 Chronic kidney disease, stage 3 unspecified: Secondary | ICD-10-CM | POA: Diagnosis not present

## 2019-02-26 DIAGNOSIS — I10 Essential (primary) hypertension: Secondary | ICD-10-CM | POA: Diagnosis not present

## 2019-02-26 DIAGNOSIS — R82998 Other abnormal findings in urine: Secondary | ICD-10-CM | POA: Diagnosis not present

## 2019-03-27 DIAGNOSIS — R21 Rash and other nonspecific skin eruption: Secondary | ICD-10-CM | POA: Diagnosis not present

## 2019-03-27 DIAGNOSIS — L301 Dyshidrosis [pompholyx]: Secondary | ICD-10-CM | POA: Diagnosis not present

## 2019-04-18 DIAGNOSIS — Z1152 Encounter for screening for COVID-19: Secondary | ICD-10-CM | POA: Diagnosis not present

## 2019-04-18 DIAGNOSIS — J181 Lobar pneumonia, unspecified organism: Secondary | ICD-10-CM | POA: Diagnosis not present

## 2019-04-18 DIAGNOSIS — R05 Cough: Secondary | ICD-10-CM | POA: Diagnosis not present

## 2019-04-18 DIAGNOSIS — R0602 Shortness of breath: Secondary | ICD-10-CM | POA: Diagnosis not present

## 2019-04-28 DIAGNOSIS — Z1152 Encounter for screening for COVID-19: Secondary | ICD-10-CM | POA: Diagnosis not present

## 2019-04-28 DIAGNOSIS — J181 Lobar pneumonia, unspecified organism: Secondary | ICD-10-CM | POA: Diagnosis not present

## 2019-04-28 DIAGNOSIS — E039 Hypothyroidism, unspecified: Secondary | ICD-10-CM | POA: Diagnosis not present

## 2019-04-28 DIAGNOSIS — R0602 Shortness of breath: Secondary | ICD-10-CM | POA: Diagnosis not present

## 2019-05-22 DIAGNOSIS — M1712 Unilateral primary osteoarthritis, left knee: Secondary | ICD-10-CM | POA: Diagnosis not present

## 2019-05-28 DIAGNOSIS — M1712 Unilateral primary osteoarthritis, left knee: Secondary | ICD-10-CM | POA: Diagnosis not present

## 2019-06-04 DIAGNOSIS — M1712 Unilateral primary osteoarthritis, left knee: Secondary | ICD-10-CM | POA: Diagnosis not present

## 2019-06-24 DIAGNOSIS — I130 Hypertensive heart and chronic kidney disease with heart failure and stage 1 through stage 4 chronic kidney disease, or unspecified chronic kidney disease: Secondary | ICD-10-CM | POA: Diagnosis not present

## 2019-06-24 DIAGNOSIS — E1169 Type 2 diabetes mellitus with other specified complication: Secondary | ICD-10-CM | POA: Diagnosis not present

## 2019-06-24 DIAGNOSIS — R0602 Shortness of breath: Secondary | ICD-10-CM | POA: Diagnosis not present

## 2019-06-24 DIAGNOSIS — M109 Gout, unspecified: Secondary | ICD-10-CM | POA: Diagnosis not present

## 2019-06-24 DIAGNOSIS — E1129 Type 2 diabetes mellitus with other diabetic kidney complication: Secondary | ICD-10-CM | POA: Diagnosis not present

## 2019-06-24 DIAGNOSIS — E039 Hypothyroidism, unspecified: Secondary | ICD-10-CM | POA: Diagnosis not present

## 2019-06-24 DIAGNOSIS — J181 Lobar pneumonia, unspecified organism: Secondary | ICD-10-CM | POA: Diagnosis not present

## 2019-06-24 DIAGNOSIS — I5032 Chronic diastolic (congestive) heart failure: Secondary | ICD-10-CM | POA: Diagnosis not present

## 2019-06-24 DIAGNOSIS — E785 Hyperlipidemia, unspecified: Secondary | ICD-10-CM | POA: Diagnosis not present

## 2019-06-24 DIAGNOSIS — N183 Chronic kidney disease, stage 3 unspecified: Secondary | ICD-10-CM | POA: Diagnosis not present

## 2019-06-24 DIAGNOSIS — F4310 Post-traumatic stress disorder, unspecified: Secondary | ICD-10-CM | POA: Diagnosis not present

## 2019-07-25 ENCOUNTER — Ambulatory Visit (INDEPENDENT_AMBULATORY_CARE_PROVIDER_SITE_OTHER): Payer: PPO | Admitting: Cardiology

## 2019-07-25 ENCOUNTER — Other Ambulatory Visit: Payer: Self-pay

## 2019-07-25 ENCOUNTER — Encounter: Payer: Self-pay | Admitting: Cardiology

## 2019-07-25 VITALS — BP 136/80 | HR 87 | Ht 68.5 in | Wt 298.0 lb

## 2019-07-25 DIAGNOSIS — Z01812 Encounter for preprocedural laboratory examination: Secondary | ICD-10-CM

## 2019-07-25 DIAGNOSIS — Z789 Other specified health status: Secondary | ICD-10-CM

## 2019-07-25 DIAGNOSIS — I10 Essential (primary) hypertension: Secondary | ICD-10-CM | POA: Diagnosis not present

## 2019-07-25 DIAGNOSIS — E782 Mixed hyperlipidemia: Secondary | ICD-10-CM

## 2019-07-25 DIAGNOSIS — I5031 Acute diastolic (congestive) heart failure: Secondary | ICD-10-CM

## 2019-07-25 DIAGNOSIS — R079 Chest pain, unspecified: Secondary | ICD-10-CM

## 2019-07-25 DIAGNOSIS — E8881 Metabolic syndrome: Secondary | ICD-10-CM

## 2019-07-25 DIAGNOSIS — Z8249 Family history of ischemic heart disease and other diseases of the circulatory system: Secondary | ICD-10-CM

## 2019-07-25 NOTE — Patient Instructions (Addendum)
Medication Instructions:  Your physician recommends that you continue on your current medications as directed. Please refer to the Current Medication list given to you today.  *If you need a refill on your cardiac medications before your next appointment, please call your pharmacy*   Lab Work: BMET [place] CTA  If you have labs (blood work) drawn today and your tests are completely normal, you will receive your results only by: Marland Kitchen MyChart Message (if you have MyChart) OR . A paper copy in the mail If you have any lab test that is abnormal or we need to change your treatment, we will call you to review the results.   Testing/Procedures: Your cardiac CT will be scheduled at one of the below locations:   [facility] [address] [place], [place] [address] [phone]   If scheduled at [facility], please arrive at the [facility] main entrance of [facility] 30 minutes prior to test start time. Proceed to the [facility] Radiology Department (first floor) to check-in and test prep. .  Please follow these instructions carefully (unless otherwise directed):  Hold all erectile dysfunction medications at least 3 days (72 hrs) prior to test.  On the Night Before the Test: . Be sure to Drink plenty of water. . Do not consume any caffeinated/decaffeinated beverages or chocolate 12 hours prior to your test. On the Day of the Test: . Drink plenty of water. Do not drink any water within one hour of the test. . Do not eat any food 4 hours prior to the test. . You may take your regular medications prior to the test.  . Take metoprolol (Lopressor) two hours prior to test.TAKE 100 MG AM OF TEST PER DR NELSON . HOLD Furosemide/Hydrochlorothiazide morning of the test. .    *For Clinical Staff only. Please instruct patient the following:*        -Drink plenty of water       -Hold Furosemide/hydrochlorothiazide morning of the test       -Take metoprolol  (Lopressor) 2 hours prior to test (if applicable).                  -If HR is less than 55 BPM- No Lopressor                -IF HR is greater than 55 BPM and patient is less than or equal to 91 yrs old Lopressor '100mg'$  x1.                -If HR is greater than 55 BPM and patient is greater than 17 yrs old Lopressor 50 mg x1.     Do not give Lopressor to patients with an allergy to lopressor or anyone with asthma or active COPD symptoms (currently taking steroids).       After the Test: . Drink plenty of water. . After receiving IV contrast, you may experience a mild flushed feeling. This is normal. . On occasion, you may experience a mild rash up to 24 hours after the test. This is not dangerous. If this occurs, you can take Benadryl 25 mg and increase your fluid intake. . If you experience trouble breathing, this can be serious. If it is severe call 911 IMMEDIATELY. If it is mild, please call our office. . If you take any of these medications: Glipizide/Metformin, Avandament, Glucavance, please do not take 48 hours after completing test unless otherwise instructed.   Once we have confirmed  authorization from your insurance company, we will call you to set up a date and time for your test.   For non-scheduling related questions, please contact the cardiac imaging nurse navigator should you have any questions/concerns: Marchia Bond, Cardiac Imaging Nurse Navigator Burley Saver, Interim Cardiac Imaging Nurse [place] and Vascular Services Direct Office Dial: [phone]   For scheduling needs, including cancellations and rescheduling, please call [phone].     Follow-Up: At [facility], you and your health needs are our priority.  As part of our continuing mission to provide you with exceptional heart care, we have created designated Provider Care Teams.  These Care Teams include your primary Cardiologist (physician) and Advanced Practice Providers (APPs -  Physician  Assistants and Nurse Practitioners) who all work together to provide you with the care you need, when you need it.  We recommend signing up for the patient portal called "MyChart".  Sign up information is provided on this After Visit Summary.  MyChart is used to connect with patients for Virtual Visits (Telemedicine).  Patients are able to view lab/test results, encounter notes, upcoming appointments, etc.  Non-urgent messages can be sent to your provider as well.   To learn more about what you can do with MyChart, go to [email or web address].    Your next appointment:   6 month(s)  The format for your next appointment:   In Person  Provider:   Ena Dawley, MD

## 2019-07-25 NOTE — Progress Notes (Signed)
Cardiology Office Note:    Date:  07/26/2019   ID:  Terry Lyons, DOB Dec 09, 1945, MRN [id number]  PCP:  Crist Infante, MD  [place] Cardiologist:  No primary care provider on file.  [facility] Electrophysiologist:  None   Referring MD: Crist Infante, MD   Reason for visit: chest pain  History of Present Illness:    Terry Lyons is a 74 y.o. male with a hx of metabolic syndrome - morbid obesity, hyper triglyceridemia, type II non-insulin-dependent diabetes, & physical inactivity. He was originally evaluated due to a concern of worsening dyspnea on exertion. Remote nuclear stress test that was negative in 12/2013. Echo from 2010 that was nonconclusive with possible inferior wall hypokinesis. FH + for coronary artery disease. His brother died of myocardial infarction 64, 2 of his grandmothers both died in their 24s of myocardial infarction. His father died of complications of rheumatic heart disease at age of 108.   Today he states that he feels more tired, SOB and has chest pains. He gained weight and doesn't have any restrictions with regards to food types or quantities.  He underwent EGD with esophageal stretching for a stricture. His chest pain started after that, they are non-exertional, last 5-30 minutes, there are no exacerbating or alleviating factors. He is almost completely inactive as he gets SOB with minimal activity.  Past Medical History:  Diagnosis Date  . Allergy   . Arthritis   . CHF (congestive heart failure) ([place])    hospitalized in 2017; was diagnosed with an acute excacerbation.   . Colon polyps    adenomatous  . Diabetes mellitus   . GERD (gastroesophageal reflux disease)   . Gout   . History of basal cell carcinoma    [facility] D[facility]  . History of squamous cell carcinoma   . Hyperlipidemia   . Hypertension   . Obesity   . Rosacea   . Tachycardia     Past Surgical History:  Procedure Laterality Date  . colonoscopy with polypectomy     X3  .  ESOPHAGOGASTRODUODENOSCOPY N/A 03/10/2015   Procedure: ESOPHAGOGASTRODUODENOSCOPY (EGD);  Surgeon: Ladene Artist, MD;  Location: [facility];  Service: Endoscopy;  Laterality: N/A;  . esophagus dilated     X3  . EUS N/A 05/20/2015   Procedure: UPPER ENDOSCOPIC ULTRASOUND (EUS) RADIAL;  Surgeon: Milus Banister, MD;  Location: [facility];  Service: Endoscopy;  Laterality: N/A;  . KNEE ARTHROSCOPY     bilaterally  . PILONIDAL CYST EXCISION    . REPLACEMENT TOTAL KNEE Right    R  . SHOULDER SURGERY     right shoulder  . TONSILLECTOMY    . total ethmoidectomy & sphenoidectomiy Bilateral 05/27/13   also reduction of turbinates  . TOTAL KNEE ARTHROPLASTY     RIGHT  . TOTAL SHOULDER ARTHROPLASTY Left 10/04/2017   Procedure: LEFT TOTAL SHOULDER ARTHROPLASTY;  Surgeon: Justice Britain, MD;  Location: [facility];  Service: Orthopedics;  Laterality: Left;    Current Medications: Current Meds  Medication Sig  . Acetaminophen 500 MG coapsule Take 1 capsule by mouth 4 (four) times daily as needed for fever.  Marland Kitchen allopurinol (ZYLOPRIM) 300 MG tablet Take 450 mg by mouth daily.   Marland Kitchen aspirin EC 81 MG tablet Take 81 mg by mouth daily.  . cetirizine (ZYRTEC) 10 MG tablet Take 10 mg by mouth daily.  . Cholecalciferol (VITAMIN D PO) Take 5,000 Units by mouth daily.   . clotrimazole (LOTRIMIN) 1 % cream  Apply 1 application topically as directed.  . Coenzyme Q10 (CO Q 10 PO) Take 1 capsule by mouth daily.   . cyclobenzaprine (FLEXERIL) 10 MG tablet Take 1 tablet (10 mg total) by mouth 3 (three) times daily as needed for muscle spasms.  Marland Kitchen doxazosin (CARDURA) 8 MG tablet Take 4 mg by mouth daily.  . furosemide (LASIX) 40 MG tablet Take 1 tablet (40 mg total) by mouth 2 (two) times daily.  Marland Kitchen gabapentin (NEURONTIN) 300 MG capsule Take 300 mg by mouth daily as needed (pain).   Marland Kitchen HYDROcodone-acetaminophen (NORCO) 7.5-325 MG tablet Take 1 tablet by mouth as needed.   . hydrocortisone 2.5 % cream Apply 1 application  topically daily as needed.  Marland Kitchen KETOCONAZOLE, TOPICAL, 1 % SHAM Apply 1 application topically See admin instructions. Every 2-3 days when he washes hair.   Marland Kitchen levothyroxine (SYNTHROID, LEVOTHROID) 50 MCG tablet Take 50 mcg by mouth daily before breakfast.  . Menthol-Methyl Salicylate (THERA-GESIC EX) Apply 1 application topically 4 (four) times daily as needed (knee pain.).   Marland Kitchen metFORMIN (GLUCOPHAGE) 500 MG tablet Take 500 mg by mouth 2 (two) times daily with a meal.  . metoprolol (LOPRESSOR) 50 MG tablet Take 50 mg by mouth 2 (two) times daily.  . mirtazapine (REMERON) 30 MG tablet Take 30 mg by mouth at bedtime.  . Multiple Vitamin (MULTIVITAMIN WITH MINERALS) TABS tablet Take 1 tablet by mouth daily. PACKET  . Omega-3 Fatty Acids (FISH OIL) 1000 MG CAPS Take 1 capsule by mouth daily.   Marland Kitchen omeprazole (PRILOSEC) 20 MG capsule Take 40 mg by mouth 2 (two) times daily.   . potassium chloride (K-DUR) 10 MEQ tablet Take 10 mEq by mouth daily.  . QUEtiapine (SEROQUEL XR) 50 MG TB24 24 hr tablet Take 150 mg by mouth at bedtime.   . Red Yeast Rice Extract (RED YEAST RICE PO) Take 1 capsule by mouth at bedtime.   . Saw Palmetto, Serenoa repens, (SAW PALMETTO PO) Take 1 capsule by mouth daily.  . temazepam (RESTORIL) 15 MG capsule Take 15 mg by mouth at bedtime as needed. Sleep  . triamcinolone cream (KENALOG) 0.1 % Apply 1 application topically daily as needed (rosacea).      Allergies:   Sulfa antibiotics, Bupropion hcl, Codeine, Escitalopram oxalate, and Sertraline hcl   Social History   Socioeconomic History  . Marital status: Married    Spouse name: Not on file  . Number of children: Not on file  . Years of education: Not on file  . Highest education level: Not on file  Occupational History  . Not on file  Tobacco Use  . Smoking status: Former Smoker    Types: Cigarettes    Quit date: 10/17/1973    Years since quitting: 45.8  . Smokeless tobacco: Never Used  Vaping Use  . Vaping Use: Never  used  Substance and Sexual Activity  . Alcohol use: No    Alcohol/week: 0.0 standard drinks  . Drug use: No  . Sexual activity: Not on file  Other Topics Concern  . Not on file  Social History Narrative  . Not on file   Social Determinants of Health   Financial Resource Strain:   . Difficulty of Paying Living Expenses:   Food Insecurity:   . Worried About Charity fundraiser in the Last Year:   . Arboriculturist in the Last Year:   Transportation Needs:   . Film/video editor (Medical):   Marland Kitchen Lack  of Transportation (Non-Medical):   Physical Activity:   . Days of Exercise per Week:   . Minutes of Exercise per Session:   Stress:   . Feeling of Stress :   Social Connections:   . Frequency of Communication with Friends and Family:   . Frequency of Social Gatherings with Friends and Family:   . Attends Religious Services:   . Active Member of Clubs or Organizations:   . Attends Archivist Meetings:   Marland Kitchen Marital Status:      Family History: The patient's family history includes Heart attack (age of onset: 51) in his brother; Heart attack (age of onset: 42) in his paternal grandfather; Heart attack (age of onset: 14) in his paternal grandmother; Heart disease in his father; Lung cancer in his maternal aunt and mother. There is no history of Diabetes, Stroke, Colon cancer, Pancreatic cancer, Rectal cancer, or Stomach cancer.  ROS:   Please see the history of present illness.    All other systems reviewed and are negative.  EKGs/Labs/Other Studies Reviewed:    The following studies were reviewed today:  EKG:  EKG is ordered today.  The ekg ordered today demonstrates SR, 82 BPM, normal ECG, unchanged from prior, personally reviewed.  Recent Labs: No results found for requested labs within last 8760 hours.  Recent Lipid Panel    Component Value Date/Time   CHOL 188 12/21/2010 1109   TRIG 252.0 (H) 12/21/2010 1109   HDL 45.30 12/21/2010 1109   CHOLHDL 4  12/21/2010 1109   VLDL 50.4 (H) 12/21/2010 1109   LDLDIRECT 104.7 12/21/2010 1109    Physical Exam:    VS:  BP 136/80   Pulse 87   Ht 5' 8.5" (1.74 m)   Wt 298 lb (135.2 kg)   SpO2 91%   BMI 44.65 kg/m     Wt Readings from Last 3 Encounters:  07/25/19 298 lb (135.2 kg)  01/22/19 290 lb (131.5 kg)  01/01/19 290 lb (131.5 kg)     GEN:  Well nourished, well developed in no acute distress HEENT: Normal NECK: No JVD; No carotid bruits LYMPHATICS: No lymphadenopathy CARDIAC: RRR, no murmurs, rubs, gallops RESPIRATORY:  Clear to auscultation without rales, wheezing or rhonchi  ABDOMEN: Soft, non-tender, non-distended MUSCULOSKELETAL:  No edema; No deformity  SKIN: Warm and dry NEUROLOGIC:  Alert and oriented x 3 PSYCHIATRIC:  Normal affect   ASSESSMENT:    1. Essential hypertension, benign   2. Acute diastolic CHF (congestive heart failure) (HCC)   3. Chest pain, unspecified type   4. Pre-procedure lab exam   5. Family history of early CAD   52. Statin intolerance   7. Mixed hyperlipidemia   8. Metabolic syndrome    PLAN:    In order of problems listed above:  Chest pain - atypical, at rest, after esophageal stretching, however multiple risk factors, we will obtain coronary CTA to further evaluate.  Acute on chronic diastolic HF - with multiple CV risk factors - We have previously checked his BNP was always normal. His shortness of breath and swelling is most probably related to physical inactivity, obesity and diet noncompliance.   HTN - BP ok on current regimen  HLD - refuses statin therapy sec to muscle/joint pain, on red yeast rice that he tolerates well. If abnormal CTA we will refer to the lipid clinic.  Metabolic syndrome - multiple risk factors - unfortunately I do not think he has the insight to make the changes that he  needs to make. He gets aggressive verbally when any intervention is suggested.   Medication Adjustments/Labs and Tests Ordered: Current  medicines are reviewed at length with the patient today.  Concerns regarding medicines are outlined above.  Orders Placed This Encounter  Procedures  . CT CORONARY MORPH W/CTA COR W/SCORE W/CA W/CM &/OR WO/CM  . CT CORONARY FRACTIONAL FLOW RESERVE DATA PREP  . CT CORONARY FRACTIONAL FLOW RESERVE FLUID ANALYSIS  . Basic metabolic panel  . EKG 12-Lead   No orders of the defined types were placed in this encounter.   Patient Instructions  Medication Instructions:  Your physician recommends that you continue on your current medications as directed. Please refer to the Current Medication list given to you today.  *If you need a refill on your cardiac medications before your next appointment, please call your pharmacy*   Lab Work: BMET [place] CTA  If you have labs (blood work) drawn today and your tests are completely normal, you will receive your results only by: Marland Kitchen MyChart Message (if you have MyChart) OR . A paper copy in the mail If you have any lab test that is abnormal or we need to change your treatment, we will call you to review the results.   Testing/Procedures: Your cardiac CT will be scheduled at one of the below locations:   [facility] [address] [place], [place] [address] [phone]   If scheduled at [facility], please arrive at the [facility] main entrance of [facility] 30 minutes prior to test start time. Proceed to the [facility] Radiology Department (first floor) to check-in and test prep. .  Please follow these instructions carefully (unless otherwise directed):  Hold all erectile dysfunction medications at least 3 days (72 hrs) prior to test.  On the Night Before the Test: . Be sure to Drink plenty of water. . Do not consume any caffeinated/decaffeinated beverages or chocolate 12 hours prior to your test. On the Day of the Test: . Drink plenty of water. Do not drink any water within one hour of the test. . Do  not eat any food 4 hours prior to the test. . You may take your regular medications prior to the test.  . Take metoprolol (Lopressor) two hours prior to test.TAKE 100 MG AM OF TEST PER DR Camdyn Laden . HOLD Furosemide/Hydrochlorothiazide morning of the test. .    *For Clinical Staff only. Please instruct patient the following:*        -Drink plenty of water       -Hold Furosemide/hydrochlorothiazide morning of the test       -Take metoprolol (Lopressor) 2 hours prior to test (if applicable).                  -If HR is less than 55 BPM- No Lopressor                -IF HR is greater than 55 BPM and patient is less than or equal to 44 yrs old Lopressor 171m x1.                -If HR is greater than 55 BPM and patient is greater than 779yrs old Lopressor 50 mg x1.     Do not give Lopressor to patients with an allergy to lopressor or anyone with asthma or active COPD symptoms (currently taking steroids).       After the Test: . Drink plenty of water. . After  receiving IV contrast, you may experience a mild flushed feeling. This is normal. . On occasion, you may experience a mild rash up to 24 hours after the test. This is not dangerous. If this occurs, you can take Benadryl 25 mg and increase your fluid intake. . If you experience trouble breathing, this can be serious. If it is severe call 911 IMMEDIATELY. If it is mild, please call our office. . If you take any of these medications: Glipizide/Metformin, Avandament, Glucavance, please do not take 48 hours after completing test unless otherwise instructed.   Once we have confirmed authorization from your insurance company, we will call you to set up a date and time for your test.   For non-scheduling related questions, please contact the cardiac imaging nurse navigator should you have any questions/concerns: Marchia Bond, Cardiac Imaging Nurse Navigator Burley Saver, Interim Cardiac Imaging Nurse [place] and Vascular  Services Direct Office Dial: [phone]   For scheduling needs, including cancellations and rescheduling, please call [phone].     Follow-Up: At [facility], you and your health needs are our priority.  As part of our continuing mission to provide you with exceptional heart care, we have created designated Provider Care Teams.  These Care Teams include your primary Cardiologist (physician) and Advanced Practice Providers (APPs -  Physician Assistants and Nurse Practitioners) who all work together to provide you with the care you need, when you need it.  We recommend signing up for the patient portal called "MyChart".  Sign up information is provided on this After Visit Summary.  MyChart is used to connect with patients for Virtual Visits (Telemedicine).  Patients are able to view lab/test results, encounter notes, upcoming appointments, etc.  Non-urgent messages can be sent to your provider as well.   To learn more about what you can do with MyChart, go to [email or web address].    Your next appointment:   6 month(s)  The format for your next appointment:   In Person  Provider:   Ena Dawley, MD      Signed, Ena Dawley, MD  07/26/2019 10:27 AM    [place]

## 2019-08-21 ENCOUNTER — Other Ambulatory Visit: Payer: Self-pay

## 2019-08-21 ENCOUNTER — Other Ambulatory Visit: Payer: PPO | Admitting: *Deleted

## 2019-08-21 DIAGNOSIS — Z01812 Encounter for preprocedural laboratory examination: Secondary | ICD-10-CM | POA: Diagnosis not present

## 2019-08-21 DIAGNOSIS — M1712 Unilateral primary osteoarthritis, left knee: Secondary | ICD-10-CM | POA: Diagnosis not present

## 2019-08-21 LAB — BASIC METABOLIC PANEL
BUN/Creatinine Ratio: 13 (ref 10–24)
BUN: 15 mg/dL (ref 8–27)
CO2: 23 mmol/L (ref 20–29)
Calcium: 9.2 mg/dL (ref 8.6–10.2)
Chloride: 100 mmol/L (ref 96–106)
Creatinine, Ser: 1.16 mg/dL (ref 0.76–1.27)
GFR calc Af Amer: 70 mL/min/{1.73_m2} (ref >59)
GFR calc non Af Amer: 61 mL/min/{1.73_m2} (ref >59)
Glucose: 157 mg/dL — ABNORMAL HIGH (ref 65–99)
Potassium: 4.4 mmol/L (ref 3.5–5.2)
Sodium: 139 mmol/L (ref 134–144)

## 2019-08-27 ENCOUNTER — Telehealth (HOSPITAL_COMMUNITY): Payer: Self-pay | Admitting: *Deleted

## 2019-08-27 ENCOUNTER — Telehealth: Payer: Self-pay | Admitting: Cardiology

## 2019-08-27 NOTE — Telephone Encounter (Signed)
° ° °  Pt and his wife calling, they said CT is tomorrow and no one call them yet to give instructions what to do prior CT

## 2019-08-27 NOTE — Telephone Encounter (Addendum)
Spoke with the pt and his wife and informed them both that CT instructions were given to him at his last OV with Dr. Meda Coffee 07/25/19.  Pt and wife were able to locate that paperwork and I went over this with them over the phone verbatim.  Hit the highpoints with the pt about how he needs to hold his lasix morning of his CT, as well as to hold Metformin 48 hours after this test.  Also instructed him that he is to take Metoprolol tartrate 100 mg po 2 hours prior to his Coronary CT.  They both were able to read along the instructions while I went over this on the phone with him. Informed them both that they should receive a call from our [place], to go over these instructions again with the pt and explain any other needed information to help prepare him for his test tomorrow.  Will send Merle RN this encounter for her reference as well. Both the pt and wife verbalized understanding and agrees with this plan. Both were gracious for all the assistance provided.

## 2019-08-27 NOTE — Telephone Encounter (Signed)
As mentioned below, pt was given these instructions in his AVS when he saw Dr. Meda Coffee in clinic on 07/25/19.     Testing/Procedures: Your cardiac CT will be scheduled at one of the below locations:   [facility] [address] [place], [place] [address] [phone]   If scheduled at [facility], please arrive at the [facility] main entrance of [facility] 30 minutes prior to test start time. Proceed to the [facility] Radiology Department (first floor) to check-in and test prep. .  Please follow these instructions carefully (unless otherwise directed):  Hold all erectile dysfunction medications at least 3 days (72 hrs) prior to test.  On the Night Before the Test:  Be sure to Drink plenty of water.  Do not consume any caffeinated/decaffeinated beverages or chocolate 12 hours prior to your test. On the Day of the Test:  Drink plenty of water. Do not drink any water within one hour of the test.  Do not eat any food 4 hours prior to the test.  You may take your regular medications prior to the test.   Take metoprolol (Lopressor) two hours prior to test.TAKE 100 MG AM OF TEST PER DR NELSON  HOLD Furosemide/Hydrochlorothiazide morning of the test.          -Drink plenty of water      After the Test:  Drink plenty of water.  After receiving IV contrast, you may experience a mild flushed feeling. This is normal.  On occasion, you may experience a mild rash up to 24 hours after the test. This is not dangerous. If this occurs, you can take Benadryl 25 mg and increase your fluid intake.  If you experience trouble breathing, this can be serious. If it is severe call 911 IMMEDIATELY. If it is mild, please call our office.  If you take any of these medications: Glipizide/Metformin, Avandament, Glucavance, please do not take 48 hours after completing test unless otherwise instructed.   Once we have confirmed authorization from your  insurance company, we will call you to set up a date and time for your test.   For non-scheduling related questions, please contact the cardiac imaging nurse navigator should you have any questions/concerns: Marchia Bond, Cardiac Imaging Nurse Navigator Burley Saver, Interim Cardiac Imaging Nurse [place] and Vascular Services Direct Office Dial: [phone]   For scheduling needs, including cancellations and rescheduling, please call [phone].

## 2019-08-27 NOTE — Telephone Encounter (Signed)
Reaching out to patient to offer assistance regarding upcoming cardiac imaging study; pt verbalizes understanding of appt date/time.  Instructions were reviewed with the cardiology prior to my phone call.  Pt states he did not have any additional questions.  Burley Saver RN Navigator Cardiac Imaging [facility] Heart and Vascular [phone] office [phone] cell

## 2019-08-28 ENCOUNTER — Encounter: Payer: PPO | Admitting: *Deleted

## 2019-08-28 ENCOUNTER — Ambulatory Visit (HOSPITAL_COMMUNITY)
Admission: RE | Admit: 2019-08-28 | Discharge: 2019-08-28 | Disposition: A | Discharge status: Home / Self Care | Payer: PPO | Source: Ambulatory Visit | Attending: Cardiology | Admitting: Cardiology

## 2019-08-28 ENCOUNTER — Other Ambulatory Visit: Payer: Self-pay

## 2019-08-28 DIAGNOSIS — Z006 Encounter for examination for normal comparison and control in clinical research program: Secondary | ICD-10-CM

## 2019-08-28 DIAGNOSIS — I700 Atherosclerosis of aorta: Secondary | ICD-10-CM | POA: Insufficient documentation

## 2019-08-28 DIAGNOSIS — R079 Chest pain, unspecified: Secondary | ICD-10-CM | POA: Insufficient documentation

## 2019-08-28 DIAGNOSIS — K760 Fatty (change of) liver, not elsewhere classified: Secondary | ICD-10-CM | POA: Diagnosis not present

## 2019-08-28 MED ORDER — NITROGLYCERIN 0.4 MG SL SUBL
0.8000 mg | SUBLINGUAL_TABLET | Freq: Once | SUBLINGUAL | Status: AC
  Administered 2019-08-28: 0.8 mg via SUBLINGUAL

## 2019-08-28 MED ORDER — METOPROLOL TARTRATE 5 MG/5ML IV SOLN
5.0000 mg | INTRAVENOUS | Status: DC | PRN
  Administered 2019-08-28: 5 mg via INTRAVENOUS

## 2019-08-28 MED ORDER — METOPROLOL TARTRATE 5 MG/5ML IV SOLN
INTRAVENOUS | Status: AC
  Filled 2019-08-28: qty 5

## 2019-08-28 MED ORDER — NITROGLYCERIN 0.4 MG SL SUBL
SUBLINGUAL_TABLET | SUBLINGUAL | Status: AC
  Filled 2019-08-28: qty 2

## 2019-08-28 MED ORDER — IOHEXOL 350 MG/ML SOLN
100.0000 mL | Freq: Once | INTRAVENOUS | Status: AC | PRN
  Administered 2019-08-28: 100 mL via INTRAVENOUS

## 2019-08-28 NOTE — Research (Signed)
CADFEM Informed Consent                  Subject Name:   Emari Demmer   Subject met inclusion and exclusion criteria.  The informed consent form, study requirements and expectations were reviewed with the subject and questions and concerns were addressed prior to the signing of the consent form.  The subject verbalized understanding of the trial requirements.  The subject agreed to participate in the CADFEM trial and signed the informed consent.  The informed consent was obtained prior to performance of any protocol-specific procedures for the subject.  A copy of the signed informed consent was given to the subject and a copy was placed in the subject's medical record.   [place] Neema Barreira, Research Assistant  08/28/2019 11:46 a.m.

## 2019-08-29 ENCOUNTER — Other Ambulatory Visit: Payer: Self-pay | Admitting: *Deleted

## 2019-08-29 DIAGNOSIS — E785 Hyperlipidemia, unspecified: Secondary | ICD-10-CM

## 2019-09-04 ENCOUNTER — Ambulatory Visit (INDEPENDENT_AMBULATORY_CARE_PROVIDER_SITE_OTHER): Payer: PPO | Admitting: Pharmacist

## 2019-09-04 ENCOUNTER — Other Ambulatory Visit: Payer: Self-pay

## 2019-09-04 DIAGNOSIS — E782 Mixed hyperlipidemia: Secondary | ICD-10-CM | POA: Diagnosis not present

## 2019-09-04 MED ORDER — PRAVASTATIN SODIUM 20 MG PO TABS
20.0000 mg | ORAL_TABLET | Freq: Every evening | ORAL | 3 refills | Status: DC
Start: 2019-09-04 — End: 2023-08-11

## 2019-09-04 NOTE — Progress Notes (Signed)
Patient ID: ROMMIE DUNN                 DOB: September 10, 1945                    MRN: [id number]     HPI: Terry Lyons is a 74 y.o. male patient referred to lipid clinic by Dr Meda Coffee. PMH is significant for metabolic syndrome - morbid obesity, hypertriglyceridemia, type 2 DM, and physical inactivity. Also with history of acute on chronic diastolic CHF, GERD, gout, HTN, arthritis, and family history of premature CAD. He reported atypical chest pain at rest during his last office visit with Dr Meda Coffee on 07/25/19. He underwent coronary CT 08/28/19 which showed elevated calcium score of 457 which was 61st percentile for age and sex. CAD-RADs score was 3 indicating moderate stenosis which was noted in the RCA, prox left main, prox LAD, and D1.  Pt presents today with his wife. He has never taken any cholesterol medication before. States he has been prescribed statins in the past but never started them due to concern over side effects he read about.   Current Medications: only OTC products - red yeast rice, fish oil 1g daily, CoQ10 Intolerances: none Risk Factors: CAD on CTA, metabolic syndrome, obesity, DM, family history of premature CAD LDL goal: 70mg /dL  Diet: Discussed heart healthy diet, pt did not offer specific diet details  Exercise: None  Family History: Brother died from MI at age 57, both grandmothers died in their 21s from MI. Paternal grandfather had MI at 22. Father died of complications from rheumatic heart disease at 85.  Social History: Former tobacco use, quit in 1975, denies alcohol and illicit drug use.  Labs: 02/17/19: TC 180, LDL 85, HDL 44, TG 257 (no lipid lowering therapy)  Past Medical History:  Diagnosis Date   Allergy    Arthritis    CHF (congestive heart failure) ([place])    hospitalized in 2017; was diagnosed with an acute excacerbation.    Colon polyps    adenomatous   Diabetes mellitus    GERD (gastroesophageal reflux disease)    Gout    History of basal cell  carcinoma    W-S Derm   History of squamous cell carcinoma    Hyperlipidemia    Hypertension    Obesity    Rosacea    Tachycardia     Current Outpatient Medications on File Prior to Visit  Medication Sig Dispense Refill   Acetaminophen 500 MG coapsule Take 1 capsule by mouth 4 (four) times daily as needed for fever.     allopurinol (ZYLOPRIM) 300 MG tablet Take 450 mg by mouth daily.      aspirin EC 81 MG tablet Take 81 mg by mouth daily.     cetirizine (ZYRTEC) 10 MG tablet Take 10 mg by mouth daily.     Cholecalciferol (VITAMIN D PO) Take 5,000 Units by mouth daily.      clotrimazole (LOTRIMIN) 1 % cream Apply 1 application topically as directed.     Coenzyme Q10 (CO Q 10 PO) Take 1 capsule by mouth daily.      cyclobenzaprine (FLEXERIL) 10 MG tablet Take 1 tablet (10 mg total) by mouth 3 (three) times daily as needed for muscle spasms. 30 tablet 0   doxazosin (CARDURA) 8 MG tablet Take 4 mg by mouth daily.     furosemide (LASIX) 40 MG tablet Take 1 tablet (40 mg total) by mouth 2 (two) times daily.  180 tablet 2   gabapentin (NEURONTIN) 300 MG capsule Take 300 mg by mouth daily as needed (pain).      HYDROcodone-acetaminophen (NORCO) 7.5-325 MG tablet Take 1 tablet by mouth as needed.      hydrocortisone 2.5 % cream Apply 1 application topically daily as needed.     KETOCONAZOLE, TOPICAL, 1 % SHAM Apply 1 application topically See admin instructions. Every 2-3 days when he washes hair.      levothyroxine (SYNTHROID, LEVOTHROID) 50 MCG tablet Take 50 mcg by mouth daily before breakfast.     Menthol-Methyl Salicylate (THERA-GESIC EX) Apply 1 application topically 4 (four) times daily as needed (knee pain.).      metFORMIN (GLUCOPHAGE) 500 MG tablet Take 500 mg by mouth 2 (two) times daily with a meal.     metoprolol (LOPRESSOR) 50 MG tablet Take 50 mg by mouth 2 (two) times daily.     mirtazapine (REMERON) 30 MG tablet Take 30 mg by mouth at bedtime.      Multiple Vitamin (MULTIVITAMIN WITH MINERALS) TABS tablet Take 1 tablet by mouth daily. PACKET     Omega-3 Fatty Acids (FISH OIL) 1000 MG CAPS Take 1 capsule by mouth daily.      omeprazole (PRILOSEC) 20 MG capsule Take 40 mg by mouth 2 (two) times daily.      potassium chloride (K-DUR) 10 MEQ tablet Take 10 mEq by mouth daily.     QUEtiapine (SEROQUEL XR) 50 MG TB24 24 hr tablet Take 150 mg by mouth at bedtime.      Red Yeast Rice Extract (RED YEAST RICE PO) Take 1 capsule by mouth at bedtime.      Saw Palmetto, Serenoa repens, (SAW PALMETTO PO) Take 1 capsule by mouth daily.     temazepam (RESTORIL) 15 MG capsule Take 15 mg by mouth at bedtime as needed. Sleep     triamcinolone cream (KENALOG) 0.1 % Apply 1 application topically daily as needed (rosacea).      No current facility-administered medications on file prior to visit.    Allergies  Allergen Reactions   Sulfa Antibiotics     UNSPECIFIED REACTION    Bupropion Hcl Other (See Comments)    REACTION: insomnia   Codeine Nausea Only   Escitalopram Oxalate Other (See Comments)    REACTION: insomnia   Sertraline Hcl Other (See Comments)    REACTION: insomnia    Assessment/Plan:  1. Hyperlipidemia - LDL 85 above goal < 70 due to CAD noted on CTA, TG 257 above goal < 150. Pt has not tried lipid lowering therapy in the past but is concerned with side effects of statins. Discussed extensive cardiovascular benefit of statins and pt was agreeable to starting pravastatin 20mg  daily. Discussed that pravastatin is hydrophilic so has lower rates of myalgias, and is net neutral on glucose (pt has DM). Rx sent to local pharmacy, also provided pt with hard copy to bring to [facility] per request. Will need lipids rechecked in 2-3 months, pt prefers to have these done at the [place]. Can try low dose rosuvastatin or ezetimibe if pt does not tolerate pravastatin. Also encouraged him to start walking for 15 minutes each day and provided him with  handout on heart healthy diet. Advised him he can stop his red yeast rice.  Traquan Duarte E. Breton Berns, PharmD, BCACP, [facility] [address], [place], [place] [address] Phone: [phone]; Fax: [phone] 09/04/2019 2:41 PM

## 2019-09-04 NOTE — Patient Instructions (Addendum)
Your LDL is 85 and your goal is < 70  Your triglycerides are 257 and your goal is < 150  Start taking pravastatin 20mg  1 tablet every evening  You can stop taking your red yeast rice  Call clinic with any trouble tolerating your medication  We would like to have your cholesterol and liver enzymes rechecked in 3 months

## 2019-09-19 ENCOUNTER — Other Ambulatory Visit: Payer: Self-pay | Admitting: Adult Health

## 2019-09-19 DIAGNOSIS — E785 Hyperlipidemia, unspecified: Secondary | ICD-10-CM | POA: Diagnosis not present

## 2019-09-19 DIAGNOSIS — U071 COVID-19: Secondary | ICD-10-CM | POA: Diagnosis not present

## 2019-09-19 DIAGNOSIS — N183 Chronic kidney disease, stage 3 unspecified: Secondary | ICD-10-CM | POA: Diagnosis not present

## 2019-09-19 DIAGNOSIS — E1129 Type 2 diabetes mellitus with other diabetic kidney complication: Secondary | ICD-10-CM | POA: Diagnosis not present

## 2019-09-19 DIAGNOSIS — J1282 Pneumonia due to coronavirus disease 2019: Secondary | ICD-10-CM | POA: Diagnosis not present

## 2019-09-19 DIAGNOSIS — Z1152 Encounter for screening for COVID-19: Secondary | ICD-10-CM | POA: Diagnosis not present

## 2019-09-19 DIAGNOSIS — I110 Hypertensive heart disease with heart failure: Secondary | ICD-10-CM | POA: Diagnosis not present

## 2019-09-19 DIAGNOSIS — R05 Cough: Secondary | ICD-10-CM | POA: Diagnosis not present

## 2019-09-19 NOTE — Progress Notes (Signed)
I connected by phone with Terry Lyons on 09/19/2019 at 8:41 PM to discuss the potential use of a new treatment for mild to moderate COVID-19 viral infection in non-hospitalized patients.  This patient is a 74 y.o. male that meets the FDA criteria for Emergency Use Authorization of COVID monoclonal antibody casirivimab/imdevimab.  Has a (+) direct SARS-CoV-2 viral test result  Has mild or moderate COVID-19   Is NOT hospitalized due to COVID-19  Is within 10 days of symptom onset  Has at least one of the high risk factor(s) for progression to severe COVID-19 and/or hospitalization as defined in EUA.  Specific high risk criteria : Older age (>/= 76 yo)   I have spoken and communicated the following to the patient or parent/caregiver regarding COVID monoclonal antibody treatment:  1. FDA has authorized the emergency use for the treatment of mild to moderate COVID-19 in adults and pediatric patients with positive results of direct SARS-CoV-2 viral testing who are 42 years of age and older weighing at least 40 kg, and who are at high risk for progressing to severe COVID-19 and/or hospitalization.  2. The significant known and potential risks and benefits of COVID monoclonal antibody, and the extent to which such potential risks and benefits are unknown.  3. Information on available alternative treatments and the risks and benefits of those alternatives, including clinical trials.  4. Patients treated with COVID monoclonal antibody should continue to self-isolate and use infection control measures (e.g., wear mask, isolate, social distance, avoid sharing personal items, clean and disinfect "high touch" surfaces, and frequent handwashing) according to CDC guidelines.   5. The patient or parent/caregiver has the option to accept or refuse COVID monoclonal antibody treatment.  After reviewing this information with the patient, The patient agreed to proceed with receiving casirivimab\imdevimab  infusion and will be provided a copy of the Fact sheet prior to receiving the infusion. Scot Dock 09/19/2019 8:41 PM

## 2019-09-21 MED ORDER — SODIUM CHLORIDE 0.9 % IV SOLN
1200.0000 mg | Freq: Once | INTRAVENOUS | Status: AC
  Administered 2019-09-22: 1200 mg via INTRAVENOUS
  Filled 2019-09-21: qty 1200

## 2019-09-22 ENCOUNTER — Ambulatory Visit (HOSPITAL_COMMUNITY)
Admission: RE | Admit: 2019-09-22 | Discharge: 2019-09-22 | Disposition: A | Discharge status: Home / Self Care | Payer: Medicare Other | Source: Ambulatory Visit | Attending: Pulmonary Disease | Admitting: Pulmonary Disease

## 2019-09-22 DIAGNOSIS — Z23 Encounter for immunization: Secondary | ICD-10-CM | POA: Insufficient documentation

## 2019-09-22 DIAGNOSIS — U071 COVID-19: Secondary | ICD-10-CM

## 2019-09-22 MED ORDER — EPINEPHRINE 0.3 MG/0.3ML IJ SOAJ: 0.3000 mg | Freq: Once | INTRAMUSCULAR | Status: DC | PRN

## 2019-09-22 MED ORDER — METHYLPREDNISOLONE SODIUM SUCC 125 MG IJ SOLR: 125.0000 mg | Freq: Once | INTRAMUSCULAR | Status: DC | PRN

## 2019-09-22 MED ORDER — SODIUM CHLORIDE 0.9 % IV SOLN: INTRAVENOUS | Status: DC | PRN

## 2019-09-22 MED ORDER — ALBUTEROL SULFATE HFA 108 (90 BASE) MCG/ACT IN AERS: 2.0000 | INHALATION_SPRAY | Freq: Once | RESPIRATORY_TRACT | Status: DC | PRN

## 2019-09-22 MED ORDER — FAMOTIDINE IN NACL 20-0.9 MG/50ML-% IV SOLN: 20.0000 mg | Freq: Once | INTRAVENOUS | Status: DC | PRN

## 2019-09-22 MED ORDER — DIPHENHYDRAMINE HCL 50 MG/ML IJ SOLN: 50.0000 mg | Freq: Once | INTRAMUSCULAR | Status: DC | PRN

## 2019-09-22 NOTE — Discharge Instructions (Signed)

## 2019-09-22 NOTE — Progress Notes (Signed)
  Diagnosis: COVID-19  Physician: Dr. Asencion Noble  Procedure: Covid Infusion Clinic Med: casirivimab\imdevimab infusion - Provided patient with casirivimab\imdevimab fact sheet for patients, parents and caregivers prior to infusion.  Complications: No immediate complications noted.  Discharge: Discharged home   Acquanetta Chain 09/22/2019

## 2019-10-06 DIAGNOSIS — J309 Allergic rhinitis, unspecified: Secondary | ICD-10-CM | POA: Diagnosis not present

## 2019-10-06 DIAGNOSIS — U071 COVID-19: Secondary | ICD-10-CM | POA: Diagnosis not present

## 2019-10-06 DIAGNOSIS — J189 Pneumonia, unspecified organism: Secondary | ICD-10-CM | POA: Diagnosis not present

## 2019-11-05 DIAGNOSIS — E1129 Type 2 diabetes mellitus with other diabetic kidney complication: Secondary | ICD-10-CM | POA: Diagnosis not present

## 2019-11-05 DIAGNOSIS — F4310 Post-traumatic stress disorder, unspecified: Secondary | ICD-10-CM | POA: Diagnosis not present

## 2019-11-05 DIAGNOSIS — U071 COVID-19: Secondary | ICD-10-CM | POA: Diagnosis not present

## 2019-11-05 DIAGNOSIS — M1990 Unspecified osteoarthritis, unspecified site: Secondary | ICD-10-CM | POA: Diagnosis not present

## 2019-11-05 DIAGNOSIS — I10 Essential (primary) hypertension: Secondary | ICD-10-CM | POA: Diagnosis not present

## 2019-11-05 DIAGNOSIS — E291 Testicular hypofunction: Secondary | ICD-10-CM | POA: Diagnosis not present

## 2019-11-05 DIAGNOSIS — E039 Hypothyroidism, unspecified: Secondary | ICD-10-CM | POA: Diagnosis not present

## 2019-11-05 DIAGNOSIS — M109 Gout, unspecified: Secondary | ICD-10-CM | POA: Diagnosis not present

## 2019-11-05 DIAGNOSIS — I5032 Chronic diastolic (congestive) heart failure: Secondary | ICD-10-CM | POA: Diagnosis not present

## 2019-11-05 DIAGNOSIS — F419 Anxiety disorder, unspecified: Secondary | ICD-10-CM | POA: Diagnosis not present

## 2019-11-05 DIAGNOSIS — I110 Hypertensive heart disease with heart failure: Secondary | ICD-10-CM | POA: Diagnosis not present

## 2020-01-26 ENCOUNTER — Other Ambulatory Visit: Payer: Self-pay

## 2020-01-26 ENCOUNTER — Ambulatory Visit: Payer: PPO | Admitting: Cardiology

## 2020-01-26 ENCOUNTER — Encounter: Payer: Self-pay | Admitting: Cardiology

## 2020-01-26 VITALS — BP 132/74 | HR 87 | Ht 68.5 in | Wt 281.2 lb

## 2020-01-26 DIAGNOSIS — R011 Cardiac murmur, unspecified: Secondary | ICD-10-CM

## 2020-01-26 DIAGNOSIS — E785 Hyperlipidemia, unspecified: Secondary | ICD-10-CM | POA: Diagnosis not present

## 2020-01-26 DIAGNOSIS — Z8249 Family history of ischemic heart disease and other diseases of the circulatory system: Secondary | ICD-10-CM | POA: Diagnosis not present

## 2020-01-26 DIAGNOSIS — E782 Mixed hyperlipidemia: Secondary | ICD-10-CM | POA: Diagnosis not present

## 2020-01-26 DIAGNOSIS — I2510 Atherosclerotic heart disease of native coronary artery without angina pectoris: Secondary | ICD-10-CM

## 2020-01-26 DIAGNOSIS — U071 COVID-19: Secondary | ICD-10-CM | POA: Diagnosis not present

## 2020-01-26 DIAGNOSIS — I350 Nonrheumatic aortic (valve) stenosis: Secondary | ICD-10-CM | POA: Diagnosis not present

## 2020-01-26 DIAGNOSIS — Z789 Other specified health status: Secondary | ICD-10-CM

## 2020-01-26 MED ORDER — CARVEDILOL 25 MG PO TABS: 25.0000 mg | ORAL_TABLET | Freq: Two times a day (BID) | ORAL | 3 refills | Status: AC

## 2020-01-26 NOTE — Progress Notes (Signed)
Cardiology Office Note:    Date:  01/26/2020   ID:  ISAO SELTZER, DOB [birth year], MRN [id number]  PCP:  Crist Infante, MD  [place] Cardiologist:  No primary care provider on file.  [facility] Electrophysiologist:  None   Referring MD: Crist Infante, MD   Reason for visit: Follow-up visit for hypertension and post Covid infection  History of Present Illness:    CONSTANTINO STARACE is a 74 y.o. male with a hx of metabolic syndrome - morbid obesity, hyper triglyceridemia, type II non-insulin-dependent diabetes, & physical inactivity. He was originally evaluated due to a concern of worsening dyspnea on exertion. Remote nuclear stress test that was negative in 12/2013. Echo from 2010 that was nonconclusive with possible inferior wall hypokinesis. FH + for coronary artery disease. His brother died of myocardial infarction 67, 2 of his grandmothers both died in their 20s of myocardial infarction. His father died of complications of rheumatic heart disease at age of 74.   The patient underwent coronary CTA in July, 2021 with finding of diffuse moderate nonobstructive CAD.  He was seen by Mathews Robinsons.D. in our lipid clinic and agreed to start pravastatin 20 mg daily that he is tolerating well.  He denies any myalgias.  His blood pressure has been elevated recently in 130s and 140s.  He denies any recent chest pain, gets short of breath on exertion, per patient unable to perform any exercise, uses scooter to shop groceries.  He had a Covid infection in August of this year and received monoclonal antibodies, he did not require hospitalization.  Past Medical History:  Diagnosis Date  . Allergy   . Arthritis   . CHF (congestive heart failure) ([place])    hospitalized in 2017; was diagnosed with an acute excacerbation.   . Colon polyps    adenomatous  . Diabetes mellitus   . GERD (gastroesophageal reflux disease)   . Gout   . History of basal cell carcinoma    W-S Derm  . History of squamous  cell carcinoma   . Hyperlipidemia   . Hypertension   . Obesity   . Rosacea   . Tachycardia     Past Surgical History:  Procedure Laterality Date  . colonoscopy with polypectomy     X3  . ESOPHAGOGASTRODUODENOSCOPY N/A 03/10/2015   Procedure: ESOPHAGOGASTRODUODENOSCOPY (EGD);  Surgeon: Ladene Artist, MD;  Location: [facility];  Service: Endoscopy;  Laterality: N/A;  . esophagus dilated     X3  . EUS N/A 05/20/2015   Procedure: UPPER ENDOSCOPIC ULTRASOUND (EUS) RADIAL;  Surgeon: Milus Banister, MD;  Location: [facility];  Service: Endoscopy;  Laterality: N/A;  . KNEE ARTHROSCOPY     bilaterally  . PILONIDAL CYST EXCISION    . REPLACEMENT TOTAL KNEE Right    R  . SHOULDER SURGERY     right shoulder  . TONSILLECTOMY    . total ethmoidectomy & sphenoidectomiy Bilateral 05/27/13   also reduction of turbinates  . TOTAL KNEE ARTHROPLASTY     RIGHT  . TOTAL SHOULDER ARTHROPLASTY Left 10/04/2017   Procedure: LEFT TOTAL SHOULDER ARTHROPLASTY;  Surgeon: Justice Britain, MD;  Location: [facility];  Service: Orthopedics;  Laterality: Left;    Current Medications: Current Meds  Medication Sig  . Acetaminophen 500 MG coapsule Take 1 capsule by mouth 4 (four) times daily as needed for fever.  Marland Kitchen allopurinol (ZYLOPRIM) 300 MG tablet Take 450 mg by mouth daily.   Marland Kitchen aspirin EC 81 MG tablet  Take 81 mg by mouth daily.  . cetirizine (ZYRTEC) 10 MG tablet Take 10 mg by mouth daily.  . Cholecalciferol (VITAMIN D PO) Take 5,000 Units by mouth daily.   . clotrimazole (LOTRIMIN) 1 % cream Apply 1 application topically as directed.  . Coenzyme Q10 (CO Q 10 PO) Take 1 capsule by mouth daily.   . cyclobenzaprine (FLEXERIL) 10 MG tablet Take 1 tablet (10 mg total) by mouth 3 (three) times daily as needed for muscle spasms.  Marland Kitchen doxazosin (CARDURA) 8 MG tablet Take 4 mg by mouth daily.  . furosemide (LASIX) 40 MG tablet Take 1 tablet (40 mg total) by mouth 2 (two) times daily.  Marland Kitchen gabapentin (NEURONTIN) 300 MG  capsule Take 300 mg by mouth daily as needed (pain).   Marland Kitchen HYDROcodone-acetaminophen (NORCO) 7.5-325 MG tablet Take 1 tablet by mouth as needed.   . hydrocortisone 2.5 % cream Apply 1 application topically daily as needed.  Marland Kitchen KETOCONAZOLE, TOPICAL, 1 % SHAM Apply 1 application topically See admin instructions. Every 2-3 days when he washes hair.  Marland Kitchen levothyroxine (SYNTHROID, LEVOTHROID) 50 MCG tablet Take 50 mcg by mouth daily before breakfast.  . Menthol-Methyl Salicylate (THERA-GESIC EX) Apply 1 application topically 4 (four) times daily as needed (knee pain.).   Marland Kitchen metFORMIN (GLUCOPHAGE) 500 MG tablet Take 500 mg by mouth 2 (two) times daily with a meal.  . mirtazapine (REMERON) 30 MG tablet Take 30 mg by mouth at bedtime.  . Multiple Vitamin (MULTIVITAMIN WITH MINERALS) TABS tablet Take 1 tablet by mouth daily. PACKET  . Omega-3 Fatty Acids (FISH OIL) 1000 MG CAPS Take 1 capsule by mouth daily.  Marland Kitchen omeprazole (PRILOSEC) 20 MG capsule Take 40 mg by mouth 2 (two) times daily.   . potassium chloride (K-DUR) 10 MEQ tablet Take 10 mEq by mouth daily.  . pravastatin (PRAVACHOL) 20 MG tablet Take 1 tablet (20 mg total) by mouth every evening.  Marland Kitchen QUEtiapine (SEROQUEL XR) 50 MG TB24 24 hr tablet Take 150 mg by mouth at bedtime.   . Saw Palmetto, Serenoa repens, (SAW PALMETTO PO) Take 1 capsule by mouth daily.  . temazepam (RESTORIL) 15 MG capsule Take 15 mg by mouth at bedtime as needed. Sleep  . triamcinolone cream (KENALOG) 0.1 % Apply 1 application topically daily as needed (rosacea).   . [DISCONTINUED] metoprolol (LOPRESSOR) 50 MG tablet Take 50 mg by mouth 2 (two) times daily.     Allergies:   Sulfa antibiotics, Bupropion hcl, Codeine, Escitalopram oxalate, and Sertraline hcl   Social History   Socioeconomic History  . Marital status: Married    Spouse name: Not on file  . Number of children: Not on file  . Years of education: Not on file  . Highest education level: Not on file  Occupational  History  . Not on file  Tobacco Use  . Smoking status: Former Smoker    Types: Cigarettes    Quit date: 10/17/1973    Years since quitting: 46.3  . Smokeless tobacco: Never Used  Vaping Use  . Vaping Use: Never used  Substance and Sexual Activity  . Alcohol use: No    Alcohol/week: 0.0 standard drinks  . Drug use: No  . Sexual activity: Not on file  Other Topics Concern  . Not on file  Social History Narrative  . Not on file   Social Determinants of Health   Financial Resource Strain: Not on file  Food Insecurity: Not on file  Transportation Needs: Not on  file  Physical Activity: Not on file  Stress: Not on file  Social Connections: Not on file     Family History: The patient's family history includes Heart attack (age of onset: 88) in his brother; Heart attack (age of onset: 62) in his paternal grandfather; Heart attack (age of onset: 46) in his paternal grandmother; Heart disease in his father; Lung cancer in his maternal aunt and mother. There is no history of Diabetes, Stroke, Colon cancer, Pancreatic cancer, Rectal cancer, or Stomach cancer.  ROS:   Please see the history of present illness.    All other systems reviewed and are negative.  EKGs/Labs/Other Studies Reviewed:    The following studies were reviewed today:  EKG:  EKG is ordered today.  The ekg ordered today demonstrates SR, normal ECG, unchanged from prior, personally reviewed.  Recent Labs: 08/21/2019: BUN 15; Creatinine, Ser 1.16; Potassium 4.4; Sodium 139  Recent Lipid Panel    Component Value Date/Time   CHOL 188 12/21/2010 1109   TRIG 252.0 (H) 12/21/2010 1109   HDL 45.30 12/21/2010 1109   CHOLHDL 4 12/21/2010 1109   VLDL 50.4 (H) 12/21/2010 1109   LDLDIRECT 104.7 12/21/2010 1109    Physical Exam:    VS:  BP 132/74   Pulse 87   Ht 5' 8.5" (1.74 m)   Wt 281 lb 3.2 oz (127.6 kg)   SpO2 94%   BMI 42.13 kg/m     Wt Readings from Last 3 Encounters:  01/26/20 281 lb 3.2 oz (127.6 kg)   07/25/19 298 lb (135.2 kg)  01/22/19 290 lb (131.5 kg)     GEN:  Well nourished, well developed in no acute distress HEENT: Normal NECK: No JVD; No carotid bruits LYMPHATICS: No lymphadenopathy CARDIAC: RRR, no murmurs, rubs, gallops RESPIRATORY:  Clear to auscultation without rales, wheezing or rhonchi  ABDOMEN: Soft, non-tender, non-distended MUSCULOSKELETAL:  No edema; No deformity  SKIN: Warm and dry NEUROLOGIC:  Alert and oriented x 3 PSYCHIATRIC:  Normal affect   ASSESSMENT:    1. Coronary artery disease involving native coronary artery of native heart without angina pectoris   2. Murmur   3. COVID   4. Mild aortic stenosis   5. Hyperlipidemia, unspecified hyperlipidemia type   6. Mixed hyperlipidemia   7. Family history of early CAD   40. Statin intolerance    PLAN:    In order of problems listed above:  Chest pain -coronary CTA showed moderate nonobstructive disease, he was started on pravastatin 20 mg daily, I will obtain new lipid panel and LFTs.  He is encouraged to find an exercise that is low impact such as biking on a stationary bike or water activities.  Acute on chronic diastolic HF - with multiple CV risk factors - We have previously checked his BNP was always normal. His shortness of breath and swelling is most probably related to physical inactivity, obesity and diet noncompliance.   HTN -elevated, I will switch metoprolol 50 mg p.o. twice daily to carvedilol 25 mg p.o. twice daily, he is advised to watch for symptoms of orthostatic hypotension and let us know if that happens.  HLD -followed by lipid clinic, started on pravastatin, we will obtain repeat lipid panel and LFTs.  Covid infection -the patient is strongly encouraged to get booster shot, as antibiotic count decreases her Covid infection is much better with vaccine.  The patient does not want to be vaccinated.  He is informed that he can get Covid infection multiple times.  We will obtain an  echocardiogram to evaluate for LVEF  Systolic murmur and mild aortic stenosis on echo in 2017, will obtain new echocardiogram.  Medication Adjustments/Labs and Tests Ordered: Current medicines are reviewed at length with the patient today.  Concerns regarding medicines are outlined above.  Orders Placed This Encounter  Procedures  . Comp Met (CMET)  . CBC  . Lipid Profile  . TSH  . ECHOCARDIOGRAM COMPLETE   Meds ordered this encounter  Medications  . carvedilol (COREG) 25 MG tablet    Sig: Take 1 tablet (25 mg total) by mouth 2 (two) times daily with a meal.    Dispense:  180 tablet    Refill:  3    Patient Instructions  Medication Instructions:   STOP TAKING METOPROLOL NOW  START TAKING CARVEDILOL 25 MG BY MOUTH TWICE DAILY  *If you need a refill on your cardiac medications before your next appointment, please call your pharmacy*   Lab Work:  THIS WEEK OR NEXT--ASAP--WILL CHECK CMET, CBC, TSH, AND LIPIDS--PLEASE COME FASTING TO THIS LAB APPOINTMENT.   If you have labs (blood work) drawn today and your tests are completely normal, you will receive your results only by: Marland Kitchen MyChart Message (if you have MyChart) OR . A paper copy in the mail If you have any lab test that is abnormal or we need to change your treatment, we will call you to review the results.   Testing/Procedures:  Your physician has requested that you have an echocardiogram. Echocardiography is a painless test that uses sound waves to create images of your heart. It provides your doctor with information about the size and shape of your heart and how well your heart's chambers and valves are working. This procedure takes approximately one hour. There are no restrictions for this procedure.  Follow-Up: At [facility], you and your health needs are our priority.  As part of our continuing mission to provide you with exceptional heart care, we have created designated Provider Care Teams.  These Care Teams  include your primary Cardiologist (physician) and Advanced Practice Providers (APPs -  Physician Assistants and Nurse Practitioners) who all work together to provide you with the care you need, when you need it.  We recommend signing up for the patient portal called "MyChart".  Sign up information is provided on this After Visit Summary.  MyChart is used to connect with patients for Virtual Visits (Telemedicine).  Patients are able to view lab/test results, encounter notes, upcoming appointments, etc.  Non-urgent messages can be sent to your provider as well.   To learn more about what you can do with MyChart, go to [email or web address].    Your next appointment:   6 month(s)  The format for your next appointment:   In Person  Provider:   Ena Dawley, MD    Signed, Ena Dawley, MD  01/26/2020 12:29 PM    [place]

## 2020-01-26 NOTE — Patient Instructions (Signed)
Medication Instructions:   STOP TAKING METOPROLOL NOW  START TAKING CARVEDILOL 25 MG BY MOUTH TWICE DAILY  *If you need a refill on your cardiac medications before your next appointment, please call your pharmacy*   Lab Work:  THIS WEEK OR NEXT--ASAP--WILL CHECK CMET, CBC, TSH, AND LIPIDS--PLEASE COME FASTING TO THIS LAB APPOINTMENT.   If you have labs (blood work) drawn today and your tests are completely normal, you will receive your results only by: [place] MyChart Message (if you have MyChart) OR . A paper copy in the mail If you have any lab test that is abnormal or we need to change your treatment, we will call you to review the results.   Testing/Procedures:  Your physician has requested that you have an echocardiogram. Echocardiography is a painless test that uses sound waves to create images of your heart. It provides your doctor with information about the size and shape of your heart and how well your heart's chambers and valves are working. This procedure takes approximately one hour. There are no restrictions for this procedure.  Follow-Up: At [facility], you and your health needs are our priority.  As part of our continuing mission to provide you with exceptional heart care, we have created designated Provider Care Teams.  These Care Teams include your primary Cardiologist (physician) and Advanced Practice Providers (APPs -  Physician Assistants and Nurse Practitioners) who all work together to provide you with the care you need, when you need it.  We recommend signing up for the patient portal called "MyChart".  Sign up information is provided on this After Visit Summary.  MyChart is used to connect with patients for Virtual Visits (Telemedicine).  Patients are able to view lab/test results, encounter notes, upcoming appointments, etc.  Non-urgent messages can be sent to your provider as well.   To learn more about what you can do with MyChart, go to [email or web address].     Your next appointment:   6 month(s)  The format for your next appointment:   In Person  Provider:   Ena Dawley, MD

## 2020-01-30 ENCOUNTER — Other Ambulatory Visit: Payer: PPO

## 2020-01-30 NOTE — Telephone Encounter (Signed)
Most recent office note faxed to [facility] today

## 2020-02-20 ENCOUNTER — Other Ambulatory Visit: Payer: Self-pay

## 2020-02-20 ENCOUNTER — Ambulatory Visit (HOSPITAL_COMMUNITY): Payer: PPO | Attending: Cardiovascular Disease

## 2020-02-20 ENCOUNTER — Other Ambulatory Visit: Payer: PPO | Admitting: *Deleted

## 2020-02-20 DIAGNOSIS — U071 COVID-19: Secondary | ICD-10-CM | POA: Diagnosis not present

## 2020-02-20 DIAGNOSIS — I350 Nonrheumatic aortic (valve) stenosis: Secondary | ICD-10-CM | POA: Insufficient documentation

## 2020-02-20 DIAGNOSIS — R011 Cardiac murmur, unspecified: Secondary | ICD-10-CM | POA: Insufficient documentation

## 2020-02-20 DIAGNOSIS — Z789 Other specified health status: Secondary | ICD-10-CM

## 2020-02-20 DIAGNOSIS — Z8249 Family history of ischemic heart disease and other diseases of the circulatory system: Secondary | ICD-10-CM | POA: Diagnosis not present

## 2020-02-20 DIAGNOSIS — E782 Mixed hyperlipidemia: Secondary | ICD-10-CM

## 2020-02-20 DIAGNOSIS — E785 Hyperlipidemia, unspecified: Secondary | ICD-10-CM

## 2020-02-20 LAB — ECHOCARDIOGRAM COMPLETE
AR max vel: 1.47 cm2
AV Area VTI: 1.55 cm2
AV Area mean vel: 1.34 cm2
AV Mean grad: 15 mmHg
AV Peak grad: 25 mmHg
Ao pk vel: 2.5 m/s
Area-P 1/2: 2.42 cm2
S' Lateral: 3 cm

## 2020-02-21 LAB — LIPID PANEL
Chol/HDL Ratio: 2.8 ratio (ref 0.0–5.0)
Cholesterol, Total: 133 mg/dL (ref 100–199)
HDL: 47 mg/dL (ref >39)
LDL Chol Calc (NIH): 46 mg/dL (ref 0–99)
Triglycerides: 260 mg/dL — ABNORMAL HIGH (ref 0–149)
VLDL Cholesterol Cal: 40 mg/dL (ref 5–40)

## 2020-02-21 LAB — COMPREHENSIVE METABOLIC PANEL
ALT: 23 IU/L (ref 0–44)
AST: 26 IU/L (ref 0–40)
Albumin/Globulin Ratio: 1.8 (ref 1.2–2.2)
Albumin: 4.2 g/dL (ref 3.7–4.7)
Alkaline Phosphatase: 84 IU/L (ref 44–121)
BUN/Creatinine Ratio: 10 (ref 10–24)
BUN: 13 mg/dL (ref 8–27)
Bilirubin Total: 0.4 mg/dL (ref 0.0–1.2)
CO2: 28 mmol/L (ref 20–29)
Calcium: 9.4 mg/dL (ref 8.6–10.2)
Chloride: 100 mmol/L (ref 96–106)
Creatinine, Ser: 1.28 mg/dL — ABNORMAL HIGH (ref 0.76–1.27)
GFR calc Af Amer: 62 mL/min/{1.73_m2} (ref >59)
GFR calc non Af Amer: 54 mL/min/{1.73_m2} — ABNORMAL LOW (ref >59)
Globulin, Total: 2.3 g/dL (ref 1.5–4.5)
Glucose: 120 mg/dL — ABNORMAL HIGH (ref 65–99)
Potassium: 4.3 mmol/L (ref 3.5–5.2)
Sodium: 140 mmol/L (ref 134–144)
Total Protein: 6.5 g/dL (ref 6.0–8.5)

## 2020-02-21 LAB — TSH: TSH: 2.32 u[IU]/mL (ref 0.450–4.500)

## 2020-02-21 LAB — CBC
Hematocrit: 46.4 % (ref 37.5–51.0)
Hemoglobin: 15.3 g/dL (ref 13.0–17.7)
MCH: 30.4 pg (ref 26.6–33.0)
MCHC: 33 g/dL (ref 31.5–35.7)
MCV: 92 fL (ref 79–97)
Platelets: 219 10*3/uL (ref 150–450)
RBC: 5.03 x10E6/uL (ref 4.14–5.80)
RDW: 13.9 % (ref 11.6–15.4)
WBC: 5.5 10*3/uL (ref 3.4–10.8)

## 2020-02-23 ENCOUNTER — Telehealth: Payer: Self-pay | Admitting: *Deleted

## 2020-02-23 MED ORDER — FENOFIBRATE 160 MG PO TABS: 160.0000 mg | ORAL_TABLET | Freq: Every day | ORAL | 3 refills | Status: DC

## 2020-02-23 NOTE — Telephone Encounter (Signed)
-----   Message from Nuala Alpha, LPN sent at 2/58/5277  8:05 AM EST -----  ----- Message ----- From: Dorothy Spark, MD Sent: 02/22/2020   9:58 PM EST To: Nuala Alpha, LPN  Stable labs except for elevated TG, please add fenofibrate 160 mg po daily to his regimen

## 2020-02-23 NOTE — Telephone Encounter (Signed)
Pt has been notified of both echo and lab results by phone with verbal understanding to all results. Pt is agreeable to plan of care to add Fenofibrate 160 mg daily to regimen, Rx has been sent in to [facility]. Pt thanked me for the call. Patient notified of result.  Please refer to phone note from today for complete details.   Julaine Hua, [facility] 02/23/2020 8:47 AM

## 2020-03-04 DIAGNOSIS — E559 Vitamin D deficiency, unspecified: Secondary | ICD-10-CM | POA: Diagnosis not present

## 2020-03-04 DIAGNOSIS — R82998 Other abnormal findings in urine: Secondary | ICD-10-CM | POA: Diagnosis not present

## 2020-03-04 DIAGNOSIS — F419 Anxiety disorder, unspecified: Secondary | ICD-10-CM | POA: Diagnosis not present

## 2020-03-04 DIAGNOSIS — E291 Testicular hypofunction: Secondary | ICD-10-CM | POA: Diagnosis not present

## 2020-03-04 DIAGNOSIS — M109 Gout, unspecified: Secondary | ICD-10-CM | POA: Diagnosis not present

## 2020-03-04 DIAGNOSIS — E1169 Type 2 diabetes mellitus with other specified complication: Secondary | ICD-10-CM | POA: Diagnosis not present

## 2020-03-04 DIAGNOSIS — Z125 Encounter for screening for malignant neoplasm of prostate: Secondary | ICD-10-CM | POA: Diagnosis not present

## 2020-03-04 DIAGNOSIS — I10 Essential (primary) hypertension: Secondary | ICD-10-CM | POA: Diagnosis not present

## 2020-03-04 DIAGNOSIS — E039 Hypothyroidism, unspecified: Secondary | ICD-10-CM | POA: Diagnosis not present

## 2020-03-04 DIAGNOSIS — N183 Chronic kidney disease, stage 3 unspecified: Secondary | ICD-10-CM | POA: Diagnosis not present

## 2020-03-04 DIAGNOSIS — I110 Hypertensive heart disease with heart failure: Secondary | ICD-10-CM | POA: Diagnosis not present

## 2020-03-04 DIAGNOSIS — Z0000 Encounter for general adult medical examination without abnormal findings: Secondary | ICD-10-CM | POA: Diagnosis not present

## 2020-03-04 DIAGNOSIS — E785 Hyperlipidemia, unspecified: Secondary | ICD-10-CM | POA: Diagnosis not present

## 2020-03-04 DIAGNOSIS — E1129 Type 2 diabetes mellitus with other diabetic kidney complication: Secondary | ICD-10-CM | POA: Diagnosis not present

## 2020-03-04 DIAGNOSIS — I5032 Chronic diastolic (congestive) heart failure: Secondary | ICD-10-CM | POA: Diagnosis not present

## 2020-03-18 DIAGNOSIS — M5031 Other cervical disc degeneration,  high cervical region: Secondary | ICD-10-CM | POA: Diagnosis not present

## 2020-03-18 DIAGNOSIS — M9901 Segmental and somatic dysfunction of cervical region: Secondary | ICD-10-CM | POA: Diagnosis not present

## 2020-03-23 DIAGNOSIS — M5031 Other cervical disc degeneration,  high cervical region: Secondary | ICD-10-CM | POA: Diagnosis not present

## 2020-03-23 DIAGNOSIS — M9901 Segmental and somatic dysfunction of cervical region: Secondary | ICD-10-CM | POA: Diagnosis not present

## 2020-03-25 DIAGNOSIS — M5031 Other cervical disc degeneration,  high cervical region: Secondary | ICD-10-CM | POA: Diagnosis not present

## 2020-03-25 DIAGNOSIS — M9901 Segmental and somatic dysfunction of cervical region: Secondary | ICD-10-CM | POA: Diagnosis not present

## 2020-03-30 DIAGNOSIS — M5031 Other cervical disc degeneration,  high cervical region: Secondary | ICD-10-CM | POA: Diagnosis not present

## 2020-03-30 DIAGNOSIS — M9901 Segmental and somatic dysfunction of cervical region: Secondary | ICD-10-CM | POA: Diagnosis not present

## 2020-04-01 DIAGNOSIS — M9901 Segmental and somatic dysfunction of cervical region: Secondary | ICD-10-CM | POA: Diagnosis not present

## 2020-04-01 DIAGNOSIS — M5031 Other cervical disc degeneration,  high cervical region: Secondary | ICD-10-CM | POA: Diagnosis not present

## 2020-04-05 DIAGNOSIS — M9901 Segmental and somatic dysfunction of cervical region: Secondary | ICD-10-CM | POA: Diagnosis not present

## 2020-04-05 DIAGNOSIS — M5031 Other cervical disc degeneration,  high cervical region: Secondary | ICD-10-CM | POA: Diagnosis not present

## 2020-04-08 DIAGNOSIS — M5031 Other cervical disc degeneration,  high cervical region: Secondary | ICD-10-CM | POA: Diagnosis not present

## 2020-04-08 DIAGNOSIS — M9901 Segmental and somatic dysfunction of cervical region: Secondary | ICD-10-CM | POA: Diagnosis not present

## 2020-04-12 DIAGNOSIS — I5030 Unspecified diastolic (congestive) heart failure: Secondary | ICD-10-CM | POA: Diagnosis not present

## 2020-04-12 DIAGNOSIS — M9901 Segmental and somatic dysfunction of cervical region: Secondary | ICD-10-CM | POA: Diagnosis not present

## 2020-04-12 DIAGNOSIS — M5031 Other cervical disc degeneration,  high cervical region: Secondary | ICD-10-CM | POA: Diagnosis not present

## 2020-04-12 DIAGNOSIS — E1122 Type 2 diabetes mellitus with diabetic chronic kidney disease: Secondary | ICD-10-CM | POA: Diagnosis not present

## 2020-04-12 DIAGNOSIS — E261 Secondary hyperaldosteronism: Secondary | ICD-10-CM | POA: Diagnosis not present

## 2020-04-12 DIAGNOSIS — F339 Major depressive disorder, recurrent, unspecified: Secondary | ICD-10-CM | POA: Diagnosis not present

## 2020-04-12 DIAGNOSIS — F4310 Post-traumatic stress disorder, unspecified: Secondary | ICD-10-CM | POA: Diagnosis not present

## 2020-04-12 DIAGNOSIS — E1151 Type 2 diabetes mellitus with diabetic peripheral angiopathy without gangrene: Secondary | ICD-10-CM | POA: Diagnosis not present

## 2020-04-12 DIAGNOSIS — E1159 Type 2 diabetes mellitus with other circulatory complications: Secondary | ICD-10-CM | POA: Diagnosis not present

## 2020-04-12 DIAGNOSIS — E1142 Type 2 diabetes mellitus with diabetic polyneuropathy: Secondary | ICD-10-CM | POA: Diagnosis not present

## 2020-04-12 DIAGNOSIS — I2510 Atherosclerotic heart disease of native coronary artery without angina pectoris: Secondary | ICD-10-CM | POA: Diagnosis not present

## 2020-04-12 DIAGNOSIS — N183 Chronic kidney disease, stage 3 unspecified: Secondary | ICD-10-CM | POA: Diagnosis not present

## 2020-04-12 DIAGNOSIS — Z6841 Body Mass Index (BMI) 40.0 and over, adult: Secondary | ICD-10-CM | POA: Diagnosis not present

## 2020-04-15 DIAGNOSIS — M9901 Segmental and somatic dysfunction of cervical region: Secondary | ICD-10-CM | POA: Diagnosis not present

## 2020-04-15 DIAGNOSIS — M5031 Other cervical disc degeneration,  high cervical region: Secondary | ICD-10-CM | POA: Diagnosis not present

## 2020-04-19 DIAGNOSIS — M5031 Other cervical disc degeneration,  high cervical region: Secondary | ICD-10-CM | POA: Diagnosis not present

## 2020-04-19 DIAGNOSIS — M9901 Segmental and somatic dysfunction of cervical region: Secondary | ICD-10-CM | POA: Diagnosis not present

## 2020-05-13 DIAGNOSIS — F419 Anxiety disorder, unspecified: Secondary | ICD-10-CM | POA: Diagnosis not present

## 2020-05-13 DIAGNOSIS — I5032 Chronic diastolic (congestive) heart failure: Secondary | ICD-10-CM | POA: Diagnosis not present

## 2020-05-13 DIAGNOSIS — E1129 Type 2 diabetes mellitus with other diabetic kidney complication: Secondary | ICD-10-CM | POA: Diagnosis not present

## 2020-05-13 DIAGNOSIS — J309 Allergic rhinitis, unspecified: Secondary | ICD-10-CM | POA: Diagnosis not present

## 2020-05-13 DIAGNOSIS — E039 Hypothyroidism, unspecified: Secondary | ICD-10-CM | POA: Diagnosis not present

## 2020-05-13 DIAGNOSIS — I10 Essential (primary) hypertension: Secondary | ICD-10-CM | POA: Diagnosis not present

## 2020-05-13 DIAGNOSIS — N183 Chronic kidney disease, stage 3 unspecified: Secondary | ICD-10-CM | POA: Diagnosis not present

## 2020-05-13 DIAGNOSIS — G4700 Insomnia, unspecified: Secondary | ICD-10-CM | POA: Diagnosis not present

## 2020-05-13 DIAGNOSIS — E1122 Type 2 diabetes mellitus with diabetic chronic kidney disease: Secondary | ICD-10-CM | POA: Diagnosis not present

## 2020-05-13 DIAGNOSIS — I130 Hypertensive heart and chronic kidney disease with heart failure and stage 1 through stage 4 chronic kidney disease, or unspecified chronic kidney disease: Secondary | ICD-10-CM | POA: Diagnosis not present

## 2020-05-13 DIAGNOSIS — M109 Gout, unspecified: Secondary | ICD-10-CM | POA: Diagnosis not present

## 2020-05-13 DIAGNOSIS — E785 Hyperlipidemia, unspecified: Secondary | ICD-10-CM | POA: Diagnosis not present

## 2020-08-03 ENCOUNTER — Encounter: Payer: Self-pay | Admitting: Cardiovascular Disease

## 2020-08-03 NOTE — Progress Notes (Signed)
Cardiology Office Note:    Date:  08/04/2020   ID:  Terry Lyons, DOB Feb 14, 1946, MRN [id number]  PCP:  Terry Infante, MD   [place] Providers Cardiologist:  Terry Lyons 3 Click to update primary MD,subspecialty MD or APP then REFRESH:1}    Referring MD: Terry Infante, MD   No chief complaint on file. HTN Past covid infection   August 04, 2020   [name] is a 75 y.o. male with a hx HTN. Previous patient of dr, Terry Lyons. This is our initial visit Fam hx of CAD, - brother died at age 81 of cad Coronary CCA revealed moderate nonobstructive CAD No CP recenlty  No regular exercise  Has lost 40 lbs over past year of so .  Had covid in august  Had an echo at the [place] - no acute issues.     Past Medical History:  Diagnosis Date   Allergy    Arthritis    CHF (congestive heart failure) ([place])    hospitalized in 2017; was diagnosed with an acute excacerbation.    Colon polyps    adenomatous   Diabetes mellitus    GERD (gastroesophageal reflux disease)    Gout    History of basal cell carcinoma    W-S Derm   History of squamous cell carcinoma    Hyperlipidemia    Hypertension    Obesity    Rosacea    Tachycardia     Past Surgical History:  Procedure Laterality Date   colonoscopy with polypectomy     X3   ESOPHAGOGASTRODUODENOSCOPY N/A 03/10/2015   Procedure: ESOPHAGOGASTRODUODENOSCOPY (EGD);  Surgeon: Terry Artist, MD;  Location: [facility];  Service: Endoscopy;  Laterality: N/A;   esophagus dilated     X3   EUS N/A 05/20/2015   Procedure: UPPER ENDOSCOPIC ULTRASOUND (EUS) RADIAL;  Surgeon: Terry Banister, MD;  Location: [facility];  Service: Endoscopy;  Laterality: N/A;   KNEE ARTHROSCOPY     bilaterally   PILONIDAL CYST EXCISION     REPLACEMENT TOTAL KNEE Right    R   SHOULDER SURGERY     right shoulder   TONSILLECTOMY     total ethmoidectomy & sphenoidectomiy Bilateral 05/27/13   also reduction of turbinates   TOTAL KNEE ARTHROPLASTY     RIGHT    TOTAL SHOULDER ARTHROPLASTY Left 10/04/2017   Procedure: LEFT TOTAL SHOULDER ARTHROPLASTY;  Surgeon: Terry Britain, MD;  Location: [place];  Service: Orthopedics;  Laterality: Left;    Current Medications: Current Meds  Medication Sig   Acetaminophen 500 MG coapsule Take 1 capsule by mouth 4 (four) times daily as needed for fever.   allopurinol (ZYLOPRIM) 300 MG tablet Take 450 mg by mouth daily.    aspirin EC 81 MG tablet Take 81 mg by mouth daily.   busPIRone (BUSPAR) 10 MG tablet TAKE ONE TABLET TWICE A DAY   carvedilol (COREG) 25 MG tablet Take 1 tablet (25 mg total) by mouth 2 (two) times daily with a meal.   cetirizine (ZYRTEC) 10 MG tablet Take 10 mg by mouth daily.   Cholecalciferol (VITAMIN D PO) Take 5,000 Units by mouth daily.    clotrimazole (LOTRIMIN) 1 % cream Apply 1 application topically as directed.   Coenzyme Q10 (CO Q 10 PO) Take 1 capsule by mouth daily.    cyclobenzaprine (FLEXERIL) 10 MG tablet Take 1 tablet (10 mg total) by mouth 3 (three) times daily as needed for muscle spasms.   doxazosin (CARDURA)  8 MG tablet Take 4 mg by mouth daily.   fenofibrate 160 MG tablet Take 1 tablet (160 mg total) by mouth daily.   furosemide (LASIX) 40 MG tablet Take 1 tablet (40 mg total) by mouth 2 (two) times daily.   gabapentin (NEURONTIN) 300 MG capsule Take 300 mg by mouth daily as needed (pain).    HYDROcodone-acetaminophen (NORCO) 7.5-325 MG tablet Take 1 tablet by mouth as needed.    hydrocortisone 2.5 % cream Apply 1 application topically daily as needed.   KETOCONAZOLE, TOPICAL, 1 % SHAM Apply 1 application topically See admin instructions. Every 2-3 days when he washes hair.   levothyroxine (SYNTHROID, LEVOTHROID) 50 MCG tablet Take 50 mcg by mouth daily before breakfast.   Menthol-Methyl Salicylate (THERA-GESIC EX) Apply 1 application topically 4 (four) times daily as needed (knee pain.).    metFORMIN (GLUCOPHAGE) 500 MG tablet Take 500 mg by mouth 2 (two) times daily with a  meal.   mirtazapine (REMERON) 30 MG tablet Take 30 mg by mouth at bedtime.   Multiple Vitamin (MULTIVITAMIN WITH MINERALS) TABS tablet Take 1 tablet by mouth daily. PACKET   Omega-3 Fatty Acids (FISH OIL) 1000 MG CAPS Take 1 capsule by mouth daily.   omeprazole (PRILOSEC) 20 MG capsule Take 40 mg by mouth 2 (two) times daily.    potassium chloride (K-DUR) 10 MEQ tablet Take 10 mEq by mouth daily.   pravastatin (PRAVACHOL) 20 MG tablet Take 1 tablet (20 mg total) by mouth every evening.   QUEtiapine (SEROQUEL XR) 50 MG TB24 24 hr tablet Take 150 mg by mouth at bedtime.    Saw Palmetto, Serenoa repens, (SAW PALMETTO PO) Take 1 capsule by mouth daily.   triamcinolone cream (KENALOG) 0.1 % Apply 1 application topically daily as needed (rosacea).      Allergies:   Sulfa antibiotics, Bupropion hcl, Codeine, Escitalopram oxalate, and Sertraline hcl   Social History   Socioeconomic History   Marital status: Married    Spouse name: Not on file   Number of children: Not on file   Years of education: Not on file   Highest education level: Not on file  Occupational History   Not on file  Tobacco Use   Smoking status: Former    Pack years: 0.00    Types: Cigarettes    Quit date: 10/17/1973    Years since quitting: 46.8   Smokeless tobacco: Never  Vaping Use   Vaping Use: Never used  Substance and Sexual Activity   Alcohol use: No    Alcohol/week: 0.0 standard drinks   Drug use: No   Sexual activity: Not on file  Other Topics Concern   Not on file  Social History Narrative   Not on file   Social Determinants of Health   Financial Resource Strain: Not on file  Food Insecurity: Not on file  Transportation Needs: Not on file  Physical Activity: Not on file  Stress: Not on file  Social Connections: Not on file     Family History: The patient's family history includes Heart attack (age of onset: 40) in his brother; Heart attack (age of onset: 41) in his paternal grandfather; Heart  attack (age of onset: 75) in his paternal grandmother; Heart disease in his father; Lung cancer in his maternal aunt and mother. There is no history of Diabetes, Stroke, Colon cancer, Pancreatic cancer, Rectal cancer, or Stomach cancer.  ROS:   Please see the history of present illness.     All  other systems reviewed and are negative.  EKGs/Labs/Other Studies Reviewed:    The following studies were reviewed today:   EKG:  August 04, 2020  NSR , no changes from previous   Recent Labs: 02/20/2020: ALT 23; BUN 13; Creatinine, Ser 1.28; Hemoglobin 15.3; Platelets 219; Potassium 4.3; Sodium 140; TSH 2.320  Recent Lipid Panel    Component Value Date/Time   CHOL 133 02/20/2020 1213   TRIG 260 (H) 02/20/2020 1213   HDL 47 02/20/2020 1213   CHOLHDL 2.8 02/20/2020 1213   CHOLHDL 4 12/21/2010 1109   VLDL 50.4 (H) 12/21/2010 1109   LDLCALC 46 02/20/2020 1213   LDLDIRECT 104.7 12/21/2010 1109     Risk Assessment/Calculations:           Physical Exam:    VS:  BP 114/64 (BP Location: Left Arm, Patient Position: Sitting)   Pulse 86   Ht 5\' 8"  (1.727 m)   Wt 269 lb (122 kg)   BMI 40.90 kg/m     Wt Readings from Last 3 Encounters:  08/04/20 269 lb (122 kg)  01/26/20 281 lb 3.2 oz (127.6 kg)  07/25/19 298 lb (135.2 kg)     GEN:  elderly , moderately obese male, NAD  HEENT: Normal NECK: No JVD; No carotid bruits LYMPHATICS: No lymphadenopathy CARDIAC: RRR,  soft systolic murmur  RESPIRATORY:  Clear to auscultation without rales, wheezing or rhonchi  ABDOMEN: Soft, non-tender, non-distended MUSCULOSKELETAL:  No edema; No deformity  SKIN: Warm and dry NEUROLOGIC:  Alert and oriented x 3 PSYCHIATRIC:  Normal affect   ASSESSMENT:    No diagnosis found. PLAN:    In order of problems listed above:  Mild CAD  2.   Hyperlipidemia :   labs are improving  Trigs were > 400 when he was in his 48s. Most recent level was 194 - he is fenofibrate  Had labs at the [place]  He will send  labs to [place] .   3.  Family hx of CAD:   brother and father died prematurely of CAD  75.  Soft systolic murmur:   he may have aortic stenosis Will get the echo report from the [place] to [place].         Medication Adjustments/Labs and Tests Ordered: Current medicines are reviewed at length with the patient today.  Concerns regarding medicines are outlined above.  No orders of the defined types were placed in this encounter.  No orders of the defined types were placed in this encounter.   Patient Instructions  Medication Instructions:  Your physician recommends that you continue on your current medications as directed. Please refer to the Current Medication list given to you today.  *If you need a refill on your cardiac medications before your next appointment, please call your pharmacy*   Lab Work: none If you have labs (blood work) drawn today and your tests are completely normal, you will receive your results only by: [place] (if you have MyChart) OR A paper copy in the mail If you have any lab test that is abnormal or we need to change your treatment, we will call you to review the results.   Testing/Procedures: none   Follow-Up: At [facility], you and your health needs are our priority.  As part of our continuing mission to provide you with exceptional heart care, we have created designated Provider Care Teams.  These Care Teams include your primary Cardiologist (physician) and Advanced Practice Providers (APPs -  Physician Assistants and  Nurse Practitioners) who all work together to provide you with the care you need, when you need it.  We recommend signing up for the patient portal called "MyChart".  Sign up information is provided on this After Visit Summary.  MyChart is used to connect with patients for Virtual Visits (Telemedicine).  Patients are able to view lab/test results, encounter notes, upcoming appointments, etc.  Non-urgent messages can be sent to your  provider as well.   To learn more about what you can do with MyChart, go to [email or web address].    Your next appointment:   1 year(s)  The format for your next appointment:   In Person  Provider:   Mertie Moores, MD   Signed, Mertie Moores, MD  08/04/2020 5:25 PM    [place]

## 2020-08-04 ENCOUNTER — Ambulatory Visit: Payer: PPO | Admitting: Cardiovascular Disease

## 2020-08-04 ENCOUNTER — Encounter: Payer: Self-pay | Admitting: Cardiovascular Disease

## 2020-08-04 VITALS — BP 114/64 | HR 86 | Ht 68.0 in | Wt 269.0 lb

## 2020-08-04 DIAGNOSIS — I5031 Acute diastolic (congestive) heart failure: Secondary | ICD-10-CM

## 2020-08-04 NOTE — Patient Instructions (Signed)
Medication Instructions:  Your physician recommends that you continue on your current medications as directed. Please refer to the Current Medication list given to you today.  *If you need a refill on your cardiac medications before your next appointment, please call your pharmacy*   Lab Work: none If you have labs (blood work) drawn today and your tests are completely normal, you will receive your results only by: [place] (if you have MyChart) OR A paper copy in the mail If you have any lab test that is abnormal or we need to change your treatment, we will call you to review the results.   Testing/Procedures: none   Follow-Up: At [facility], you and your health needs are our priority.  As part of our continuing mission to provide you with exceptional heart care, we have created designated Provider Care Teams.  These Care Teams include your primary Cardiologist (physician) and Advanced Practice Providers (APPs -  Physician Assistants and Nurse Practitioners) who all work together to provide you with the care you need, when you need it.  We recommend signing up for the patient portal called "MyChart".  Sign up information is provided on this After Visit Summary.  MyChart is used to connect with patients for Virtual Visits (Telemedicine).  Patients are able to view lab/test results, encounter notes, upcoming appointments, etc.  Non-urgent messages can be sent to your provider as well.   To learn more about what you can do with MyChart, go to [email or web address].    Your next appointment:   1 year(s)  The format for your next appointment:   In Person  Provider:   Mertie Moores, MD

## 2020-08-05 ENCOUNTER — Other Ambulatory Visit: Payer: Self-pay

## 2020-08-05 NOTE — Addendum Note (Signed)
Addended by: Para March on: 08/05/2020 10:52 AM   Modules accepted: Orders

## 2020-08-12 DIAGNOSIS — I5032 Chronic diastolic (congestive) heart failure: Secondary | ICD-10-CM | POA: Diagnosis not present

## 2020-08-12 DIAGNOSIS — N183 Chronic kidney disease, stage 3 unspecified: Secondary | ICD-10-CM | POA: Diagnosis not present

## 2020-08-12 DIAGNOSIS — I130 Hypertensive heart and chronic kidney disease with heart failure and stage 1 through stage 4 chronic kidney disease, or unspecified chronic kidney disease: Secondary | ICD-10-CM | POA: Diagnosis not present

## 2020-08-12 DIAGNOSIS — E1122 Type 2 diabetes mellitus with diabetic chronic kidney disease: Secondary | ICD-10-CM | POA: Diagnosis not present

## 2020-09-10 DIAGNOSIS — G4700 Insomnia, unspecified: Secondary | ICD-10-CM | POA: Diagnosis not present

## 2020-09-10 DIAGNOSIS — E1129 Type 2 diabetes mellitus with other diabetic kidney complication: Secondary | ICD-10-CM | POA: Diagnosis not present

## 2020-09-10 DIAGNOSIS — F419 Anxiety disorder, unspecified: Secondary | ICD-10-CM | POA: Diagnosis not present

## 2020-09-10 DIAGNOSIS — I1310 Hypertensive heart and chronic kidney disease without heart failure, with stage 1 through stage 4 chronic kidney disease, or unspecified chronic kidney disease: Secondary | ICD-10-CM | POA: Diagnosis not present

## 2020-09-10 DIAGNOSIS — N183 Chronic kidney disease, stage 3 unspecified: Secondary | ICD-10-CM | POA: Diagnosis not present

## 2020-09-10 DIAGNOSIS — E785 Hyperlipidemia, unspecified: Secondary | ICD-10-CM | POA: Diagnosis not present

## 2020-09-10 DIAGNOSIS — J309 Allergic rhinitis, unspecified: Secondary | ICD-10-CM | POA: Diagnosis not present

## 2020-09-10 DIAGNOSIS — E039 Hypothyroidism, unspecified: Secondary | ICD-10-CM | POA: Diagnosis not present

## 2020-09-10 DIAGNOSIS — I5032 Chronic diastolic (congestive) heart failure: Secondary | ICD-10-CM | POA: Diagnosis not present

## 2020-11-01 DIAGNOSIS — M170 Bilateral primary osteoarthritis of knee: Secondary | ICD-10-CM | POA: Diagnosis not present

## 2020-11-01 DIAGNOSIS — M1712 Unilateral primary osteoarthritis, left knee: Secondary | ICD-10-CM | POA: Diagnosis not present

## 2020-11-12 DIAGNOSIS — N183 Chronic kidney disease, stage 3 unspecified: Secondary | ICD-10-CM | POA: Diagnosis not present

## 2020-11-12 DIAGNOSIS — I5032 Chronic diastolic (congestive) heart failure: Secondary | ICD-10-CM | POA: Diagnosis not present

## 2020-11-12 DIAGNOSIS — I130 Hypertensive heart and chronic kidney disease with heart failure and stage 1 through stage 4 chronic kidney disease, or unspecified chronic kidney disease: Secondary | ICD-10-CM | POA: Diagnosis not present

## 2020-11-12 DIAGNOSIS — E1122 Type 2 diabetes mellitus with diabetic chronic kidney disease: Secondary | ICD-10-CM | POA: Diagnosis not present

## 2020-11-23 ENCOUNTER — Other Ambulatory Visit: Payer: Self-pay | Admitting: Orthopedic Surgery

## 2020-11-23 DIAGNOSIS — M720 Palmar fascial fibromatosis [Dupuytren]: Secondary | ICD-10-CM | POA: Diagnosis not present

## 2020-12-09 DIAGNOSIS — R519 Headache, unspecified: Secondary | ICD-10-CM | POA: Diagnosis not present

## 2020-12-09 DIAGNOSIS — Z1152 Encounter for screening for COVID-19: Secondary | ICD-10-CM | POA: Diagnosis not present

## 2020-12-09 DIAGNOSIS — R5383 Other fatigue: Secondary | ICD-10-CM | POA: Diagnosis not present

## 2020-12-09 DIAGNOSIS — R0602 Shortness of breath: Secondary | ICD-10-CM | POA: Diagnosis not present

## 2020-12-09 DIAGNOSIS — U071 COVID-19: Secondary | ICD-10-CM | POA: Diagnosis not present

## 2020-12-09 DIAGNOSIS — R058 Other specified cough: Secondary | ICD-10-CM | POA: Diagnosis not present

## 2020-12-16 DIAGNOSIS — M1712 Unilateral primary osteoarthritis, left knee: Secondary | ICD-10-CM | POA: Diagnosis not present

## 2020-12-23 DIAGNOSIS — M1712 Unilateral primary osteoarthritis, left knee: Secondary | ICD-10-CM | POA: Diagnosis not present

## 2021-01-03 DIAGNOSIS — M1712 Unilateral primary osteoarthritis, left knee: Secondary | ICD-10-CM | POA: Diagnosis not present

## 2021-01-16 ENCOUNTER — Emergency Department (HOSPITAL_COMMUNITY): Payer: No Typology Code available for payment source

## 2021-01-16 ENCOUNTER — Inpatient Hospital Stay (HOSPITAL_COMMUNITY)
Admission: EM | Admit: 2021-01-16 | Discharge: 2021-01-18 | DRG: 871 | Disposition: A | Discharge status: Home / Self Care | Payer: No Typology Code available for payment source | Attending: Internal Medicine | Admitting: Internal Medicine

## 2021-01-16 ENCOUNTER — Encounter (HOSPITAL_COMMUNITY): Payer: Self-pay | Admitting: Emergency Medicine

## 2021-01-16 ENCOUNTER — Other Ambulatory Visit: Payer: Self-pay

## 2021-01-16 ENCOUNTER — Observation Stay (HOSPITAL_COMMUNITY): Payer: No Typology Code available for payment source

## 2021-01-16 DIAGNOSIS — N179 Acute kidney failure, unspecified: Secondary | ICD-10-CM | POA: Diagnosis not present

## 2021-01-16 DIAGNOSIS — N1831 Chronic kidney disease, stage 3a: Secondary | ICD-10-CM | POA: Diagnosis present

## 2021-01-16 DIAGNOSIS — A419 Sepsis, unspecified organism: Secondary | ICD-10-CM

## 2021-01-16 DIAGNOSIS — Z882 Allergy status to sulfonamides status: Secondary | ICD-10-CM

## 2021-01-16 DIAGNOSIS — F419 Anxiety disorder, unspecified: Secondary | ICD-10-CM | POA: Diagnosis present

## 2021-01-16 DIAGNOSIS — G928 Other toxic encephalopathy: Secondary | ICD-10-CM | POA: Diagnosis present

## 2021-01-16 DIAGNOSIS — Z7982 Long term (current) use of aspirin: Secondary | ICD-10-CM

## 2021-01-16 DIAGNOSIS — Z96612 Presence of left artificial shoulder joint: Secondary | ICD-10-CM | POA: Diagnosis present

## 2021-01-16 DIAGNOSIS — Z79890 Hormone replacement therapy (postmenopausal): Secondary | ICD-10-CM

## 2021-01-16 DIAGNOSIS — Z8249 Family history of ischemic heart disease and other diseases of the circulatory system: Secondary | ICD-10-CM

## 2021-01-16 DIAGNOSIS — N3001 Acute cystitis with hematuria: Secondary | ICD-10-CM

## 2021-01-16 DIAGNOSIS — M109 Gout, unspecified: Secondary | ICD-10-CM | POA: Diagnosis present

## 2021-01-16 DIAGNOSIS — K219 Gastro-esophageal reflux disease without esophagitis: Secondary | ICD-10-CM | POA: Diagnosis present

## 2021-01-16 DIAGNOSIS — E039 Hypothyroidism, unspecified: Secondary | ICD-10-CM | POA: Diagnosis present

## 2021-01-16 DIAGNOSIS — G9341 Metabolic encephalopathy: Secondary | ICD-10-CM | POA: Diagnosis present

## 2021-01-16 DIAGNOSIS — Z20822 Contact with and (suspected) exposure to covid-19: Secondary | ICD-10-CM | POA: Diagnosis present

## 2021-01-16 DIAGNOSIS — Z96651 Presence of right artificial knee joint: Secondary | ICD-10-CM | POA: Diagnosis present

## 2021-01-16 DIAGNOSIS — R4182 Altered mental status, unspecified: Secondary | ICD-10-CM | POA: Diagnosis not present

## 2021-01-16 DIAGNOSIS — Z87891 Personal history of nicotine dependence: Secondary | ICD-10-CM

## 2021-01-16 DIAGNOSIS — I5032 Chronic diastolic (congestive) heart failure: Secondary | ICD-10-CM | POA: Diagnosis present

## 2021-01-16 DIAGNOSIS — Z85828 Personal history of other malignant neoplasm of skin: Secondary | ICD-10-CM

## 2021-01-16 DIAGNOSIS — N400 Enlarged prostate without lower urinary tract symptoms: Secondary | ICD-10-CM | POA: Diagnosis present

## 2021-01-16 DIAGNOSIS — R509 Fever, unspecified: Secondary | ICD-10-CM | POA: Diagnosis not present

## 2021-01-16 DIAGNOSIS — E1122 Type 2 diabetes mellitus with diabetic chronic kidney disease: Secondary | ICD-10-CM | POA: Diagnosis present

## 2021-01-16 DIAGNOSIS — Z6838 Body mass index (BMI) 38.0-38.9, adult: Secondary | ICD-10-CM

## 2021-01-16 DIAGNOSIS — E876 Hypokalemia: Secondary | ICD-10-CM | POA: Diagnosis present

## 2021-01-16 DIAGNOSIS — N2889 Other specified disorders of kidney and ureter: Secondary | ICD-10-CM | POA: Diagnosis present

## 2021-01-16 DIAGNOSIS — E785 Hyperlipidemia, unspecified: Secondary | ICD-10-CM | POA: Diagnosis present

## 2021-01-16 DIAGNOSIS — Z888 Allergy status to other drugs, medicaments and biological substances status: Secondary | ICD-10-CM

## 2021-01-16 DIAGNOSIS — G9340 Encephalopathy, unspecified: Secondary | ICD-10-CM

## 2021-01-16 DIAGNOSIS — A4151 Sepsis due to Escherichia coli [E. coli]: Principal | ICD-10-CM | POA: Diagnosis present

## 2021-01-16 DIAGNOSIS — Z885 Allergy status to narcotic agent status: Secondary | ICD-10-CM

## 2021-01-16 DIAGNOSIS — I130 Hypertensive heart and chronic kidney disease with heart failure and stage 1 through stage 4 chronic kidney disease, or unspecified chronic kidney disease: Secondary | ICD-10-CM | POA: Diagnosis present

## 2021-01-16 DIAGNOSIS — Z7984 Long term (current) use of oral hypoglycemic drugs: Secondary | ICD-10-CM

## 2021-01-16 DIAGNOSIS — F32A Depression, unspecified: Secondary | ICD-10-CM | POA: Diagnosis present

## 2021-01-16 DIAGNOSIS — N390 Urinary tract infection, site not specified: Secondary | ICD-10-CM | POA: Diagnosis present

## 2021-01-16 DIAGNOSIS — Z79899 Other long term (current) drug therapy: Secondary | ICD-10-CM

## 2021-01-16 DIAGNOSIS — E669 Obesity, unspecified: Secondary | ICD-10-CM | POA: Diagnosis present

## 2021-01-16 LAB — HEMOGLOBIN A1C
Hgb A1c MFr Bld: 5.8 % — ABNORMAL HIGH (ref 4.8–5.6)
Mean Plasma Glucose: 119.76 mg/dL

## 2021-01-16 LAB — CBC WITH DIFFERENTIAL/PLATELET
Abs Immature Granulocytes: 0.51 10*3/uL — ABNORMAL HIGH (ref 0.00–0.07)
Basophils Absolute: 0.1 10*3/uL (ref 0.0–0.1)
Basophils Relative: 0 %
Eosinophils Absolute: 0.1 10*3/uL (ref 0.0–0.5)
Eosinophils Relative: 0 %
HCT: 42.8 % (ref 39.0–52.0)
Hemoglobin: 14.1 g/dL (ref 13.0–17.0)
Immature Granulocytes: 2 %
Lymphocytes Relative: 4 %
Lymphs Abs: 1.1 10*3/uL (ref 0.7–4.0)
MCH: 30.7 pg (ref 26.0–34.0)
MCHC: 32.9 g/dL (ref 30.0–36.0)
MCV: 93.2 fL (ref 80.0–100.0)
Monocytes Absolute: 2.2 10*3/uL — ABNORMAL HIGH (ref 0.1–1.0)
Monocytes Relative: 8 %
Neutro Abs: 22.5 10*3/uL — ABNORMAL HIGH (ref 1.7–7.7)
Neutrophils Relative %: 86 %
Platelets: 227 10*3/uL (ref 150–400)
RBC: 4.59 MIL/uL (ref 4.22–5.81)
RDW: 14.7 % (ref 11.5–15.5)
WBC: 26.3 10*3/uL — ABNORMAL HIGH (ref 4.0–10.5)
nRBC: 0 % (ref 0.0–0.2)

## 2021-01-16 LAB — URINALYSIS, ROUTINE W REFLEX MICROSCOPIC
Bilirubin Urine: NEGATIVE
Glucose, UA: NEGATIVE mg/dL
Ketones, ur: NEGATIVE mg/dL
Nitrite: NEGATIVE
Protein, ur: 100 mg/dL — AB
RBC / HPF: >50 RBC/hpf — ABNORMAL HIGH (ref 0–5)
Specific Gravity, Urine: 1.026 (ref 1.005–1.030)
WBC, UA: >50 WBC/hpf — ABNORMAL HIGH (ref 0–5)
pH: 5 (ref 5.0–8.0)

## 2021-01-16 LAB — COMPREHENSIVE METABOLIC PANEL
ALT: 25 U/L (ref 0–44)
AST: 34 U/L (ref 15–41)
Albumin: 3.2 g/dL — ABNORMAL LOW (ref 3.5–5.0)
Alkaline Phosphatase: 66 U/L (ref 38–126)
Anion gap: 11 (ref 5–15)
BUN: 20 mg/dL (ref 8–23)
CO2: 21 mmol/L — ABNORMAL LOW (ref 22–32)
Calcium: 9.1 mg/dL (ref 8.9–10.3)
Chloride: 101 mmol/L (ref 98–111)
Creatinine, Ser: 1.5 mg/dL — ABNORMAL HIGH (ref 0.61–1.24)
GFR, Estimated: 48 mL/min — ABNORMAL LOW (ref >60)
Glucose, Bld: 166 mg/dL — ABNORMAL HIGH (ref 70–99)
Potassium: 3.7 mmol/L (ref 3.5–5.1)
Sodium: 133 mmol/L — ABNORMAL LOW (ref 135–145)
Total Bilirubin: 1.4 mg/dL — ABNORMAL HIGH (ref 0.3–1.2)
Total Protein: 6.8 g/dL (ref 6.5–8.1)

## 2021-01-16 LAB — CBG MONITORING, ED
Glucose-Capillary: 178 mg/dL — ABNORMAL HIGH (ref 70–99)
Glucose-Capillary: 133 mg/dL — ABNORMAL HIGH (ref 70–99)

## 2021-01-16 LAB — RESP PANEL BY RT-PCR (FLU A&B, COVID) ARPGX2
Influenza A by PCR: NEGATIVE
Influenza B by PCR: NEGATIVE
SARS Coronavirus 2 by RT PCR: NEGATIVE

## 2021-01-16 LAB — PROTIME-INR
INR: 1.2 (ref 0.8–1.2)
Prothrombin Time: 14.9 seconds (ref 11.4–15.2)

## 2021-01-16 LAB — LACTIC ACID, PLASMA: Lactic Acid, Venous: 1.7 mmol/L (ref 0.5–1.9)

## 2021-01-16 MED ORDER — FENOFIBRATE 160 MG PO TABS
160.0000 mg | ORAL_TABLET | Freq: Every day | ORAL | Status: DC
  Administered 2021-01-16 – 2021-01-18 (×3): 160 mg via ORAL
  Filled 2021-01-16 (×3): qty 1

## 2021-01-16 MED ORDER — SODIUM CHLORIDE 0.9 % IV SOLN
2.0000 g | Freq: Once | INTRAVENOUS | Status: AC
  Administered 2021-01-16: 12:00:00 2 g via INTRAVENOUS
  Filled 2021-01-16: qty 20

## 2021-01-16 MED ORDER — SODIUM CHLORIDE 0.9 % IV BOLUS
500.0000 mL | Freq: Once | INTRAVENOUS | Status: AC
  Administered 2021-01-16: 12:00:00 500 mL via INTRAVENOUS

## 2021-01-16 MED ORDER — ALLOPURINOL 300 MG PO TABS
450.0000 mg | ORAL_TABLET | Freq: Every day | ORAL | Status: DC
  Administered 2021-01-16 – 2021-01-18 (×3): 450 mg via ORAL
  Filled 2021-01-16: qty 2
  Filled 2021-01-16 (×2): qty 5

## 2021-01-16 MED ORDER — ONDANSETRON HCL 4 MG/2ML IJ SOLN: 4.0000 mg | Freq: Four times a day (QID) | INTRAMUSCULAR | Status: DC | PRN

## 2021-01-16 MED ORDER — PRAVASTATIN SODIUM 10 MG PO TABS
20.0000 mg | ORAL_TABLET | Freq: Every evening | ORAL | Status: DC
  Administered 2021-01-16 – 2021-01-17 (×2): 20 mg via ORAL
  Filled 2021-01-16 (×2): qty 2

## 2021-01-16 MED ORDER — MIRTAZAPINE 15 MG PO TABS
30.0000 mg | ORAL_TABLET | Freq: Every day | ORAL | Status: DC
  Administered 2021-01-16 – 2021-01-17 (×2): 30 mg via ORAL
  Filled 2021-01-16 (×2): qty 1
  Filled 2021-01-16: qty 2

## 2021-01-16 MED ORDER — ENOXAPARIN SODIUM 40 MG/0.4ML IJ SOSY
40.0000 mg | PREFILLED_SYRINGE | INTRAMUSCULAR | Status: DC
  Administered 2021-01-17: 40 mg via SUBCUTANEOUS
  Filled 2021-01-16: qty 0.4

## 2021-01-16 MED ORDER — TAMSULOSIN HCL 0.4 MG PO CAPS
0.4000 mg | ORAL_CAPSULE | Freq: Every day | ORAL | Status: DC
  Administered 2021-01-16 – 2021-01-17 (×2): 0.4 mg via ORAL
  Filled 2021-01-16 (×2): qty 1

## 2021-01-16 MED ORDER — LEVOTHYROXINE SODIUM 50 MCG PO TABS
50.0000 ug | ORAL_TABLET | Freq: Every day | ORAL | Status: DC
  Administered 2021-01-17 – 2021-01-18 (×2): 50 ug via ORAL
  Filled 2021-01-16: qty 2
  Filled 2021-01-16: qty 1

## 2021-01-16 MED ORDER — ACETAMINOPHEN 325 MG PO TABS
650.0000 mg | ORAL_TABLET | Freq: Four times a day (QID) | ORAL | Status: DC | PRN
  Administered 2021-01-16 (×2): 650 mg via ORAL
  Filled 2021-01-16 (×2): qty 2

## 2021-01-16 MED ORDER — QUETIAPINE FUMARATE ER 50 MG PO TB24
150.0000 mg | ORAL_TABLET | Freq: Every day | ORAL | Status: DC
  Administered 2021-01-16 – 2021-01-17 (×2): 150 mg via ORAL
  Filled 2021-01-16 (×2): qty 3

## 2021-01-16 MED ORDER — CARVEDILOL 25 MG PO TABS
25.0000 mg | ORAL_TABLET | Freq: Two times a day (BID) | ORAL | Status: DC
  Administered 2021-01-17 – 2021-01-18 (×3): 25 mg via ORAL
  Filled 2021-01-16: qty 2
  Filled 2021-01-16 (×2): qty 1

## 2021-01-16 MED ORDER — LORATADINE 10 MG PO TABS
10.0000 mg | ORAL_TABLET | Freq: Every day | ORAL | Status: DC
  Administered 2021-01-16 – 2021-01-18 (×3): 10 mg via ORAL
  Filled 2021-01-16 (×3): qty 1

## 2021-01-16 MED ORDER — SODIUM CHLORIDE 0.9 % IV SOLN
2.0000 g | INTRAVENOUS | Status: DC
  Administered 2021-01-17 – 2021-01-18 (×2): 2 g via INTRAVENOUS
  Filled 2021-01-16 (×2): qty 20

## 2021-01-16 MED ORDER — PANTOPRAZOLE SODIUM 40 MG PO TBEC
40.0000 mg | DELAYED_RELEASE_TABLET | Freq: Every day | ORAL | Status: DC
  Administered 2021-01-16 – 2021-01-18 (×3): 40 mg via ORAL
  Filled 2021-01-16 (×3): qty 1

## 2021-01-16 MED ORDER — DOXAZOSIN MESYLATE 4 MG PO TABS
4.0000 mg | ORAL_TABLET | Freq: Every day | ORAL | Status: DC
  Administered 2021-01-16 – 2021-01-18 (×3): 4 mg via ORAL
  Filled 2021-01-16 (×3): qty 1

## 2021-01-16 MED ORDER — METFORMIN HCL 500 MG PO TABS: 500.0000 mg | ORAL_TABLET | Freq: Two times a day (BID) | ORAL | Status: DC

## 2021-01-16 MED ORDER — ONDANSETRON HCL 4 MG PO TABS: 4.0000 mg | ORAL_TABLET | Freq: Four times a day (QID) | ORAL | Status: DC | PRN

## 2021-01-16 MED ORDER — ASPIRIN EC 81 MG PO TBEC
81.0000 mg | DELAYED_RELEASE_TABLET | Freq: Every day | ORAL | Status: DC
  Administered 2021-01-16 – 2021-01-18 (×3): 81 mg via ORAL
  Filled 2021-01-16 (×3): qty 1

## 2021-01-16 MED ORDER — CYCLOBENZAPRINE HCL 5 MG PO TABS: 10.0000 mg | ORAL_TABLET | Freq: Three times a day (TID) | ORAL | Status: DC | PRN

## 2021-01-16 MED ORDER — BUSPIRONE HCL 5 MG PO TABS
10.0000 mg | ORAL_TABLET | Freq: Two times a day (BID) | ORAL | Status: DC
  Administered 2021-01-16 – 2021-01-18 (×4): 10 mg via ORAL
  Filled 2021-01-16: qty 2
  Filled 2021-01-16 (×2): qty 1
  Filled 2021-01-16: qty 2

## 2021-01-16 MED ORDER — INSULIN ASPART 100 UNIT/ML IJ SOLN
0.0000 [IU] | Freq: Three times a day (TID) | INTRAMUSCULAR | Status: DC
  Administered 2021-01-16: 17:00:00 1 [IU] via SUBCUTANEOUS
  Administered 2021-01-17 (×2): 2 [IU] via SUBCUTANEOUS
  Administered 2021-01-17 – 2021-01-18 (×2): 1 [IU] via SUBCUTANEOUS

## 2021-01-16 MED ORDER — ACETAMINOPHEN 650 MG RE SUPP: 650.0000 mg | Freq: Four times a day (QID) | RECTAL | Status: DC | PRN

## 2021-01-16 MED ORDER — SODIUM CHLORIDE 0.9 % IV SOLN: INTRAVENOUS | Status: AC

## 2021-01-16 NOTE — H&P (Addendum)
History and Physical    Terry Lyons XTG:[id number] DOB: 06/09/1945 DOA: 01/16/2021  PCP: Crist Infante, MD (Confirm with patient/family/NH records and if not entered, this has to be entered at [facility] point of entry) Patient coming from: Home  I have personally briefly reviewed patient's old medical records in [place]  Chief Complaint: Feeling tired.  HPI: Terry Lyons is a 75 y.o. male with medical history significant of chronic diastolic CHF, HTN, IIDM, HLD, anxiety/depression, Gout, hypothyroid, presented with dysuria, fever and confusion.    Patient underwent a tongue lesion excision on Friday, then had persistent tongue numbness and pain for last two days and as a result, significant decreased oral intakes and all his pills for last two days.  Yesterday, patient experienced urinary frequency and dysuria, and last night had a low grade fever of 100.5 F. No back pain, no N/V. Daughter also reported that patient had episodes of confusion.  Wife reported that patient has had urinary frequency for "some months" and dribbling but never had UTI or diagnosed with BPH before.  ED Course: Borderline tachycardia, no hypotension, T 99.8.  WBC 26.3, Cre 1.5 compared to baseline 1.1. UA showed pyuria.  CT head negative for acute findings.  One liter of NS bolus given and Ceftriaxone started in ED.  Review of Systems: As per HPI otherwise 14 point review of systems negative.    Past Medical History:  Diagnosis Date   Allergy    Arthritis    CHF (congestive heart failure) ([place])    hospitalized in 2017; was diagnosed with an acute excacerbation.    Colon polyps    adenomatous   Diabetes mellitus    GERD (gastroesophageal reflux disease)    Gout    History of basal cell carcinoma    W-S Derm   History of squamous cell carcinoma    Hyperlipidemia    Hypertension    Obesity    Rosacea    Tachycardia     Past Surgical History:  Procedure Laterality Date   colonoscopy with  polypectomy     X3   ESOPHAGOGASTRODUODENOSCOPY N/A 03/10/2015   Procedure: ESOPHAGOGASTRODUODENOSCOPY (EGD);  Surgeon: Ladene Artist, MD;  Location: [facility];  Service: Endoscopy;  Laterality: N/A;   esophagus dilated     X3   EUS N/A 05/20/2015   Procedure: UPPER ENDOSCOPIC ULTRASOUND (EUS) RADIAL;  Surgeon: Milus Banister, MD;  Location: [facility];  Service: Endoscopy;  Laterality: N/A;   KNEE ARTHROSCOPY     bilaterally   PILONIDAL CYST EXCISION     REPLACEMENT TOTAL KNEE Right    R   SHOULDER SURGERY     right shoulder   TONSILLECTOMY     total ethmoidectomy & sphenoidectomiy Bilateral 05/27/13   also reduction of turbinates   TOTAL KNEE ARTHROPLASTY     RIGHT   TOTAL SHOULDER ARTHROPLASTY Left 10/04/2017   Procedure: LEFT TOTAL SHOULDER ARTHROPLASTY;  Surgeon: Justice Britain, MD;  Location: [facility];  Service: Orthopedics;  Laterality: Left;     reports that he quit smoking about 47 years ago. His smoking use included cigarettes. He has never used smokeless tobacco. He reports that he does not drink alcohol and does not use drugs.  Allergies  Allergen Reactions   Sulfa Antibiotics     UNSPECIFIED REACTION    Bupropion Hcl Other (See Comments)    REACTION: insomnia   Codeine Nausea Only   Escitalopram Oxalate Other (See Comments)    REACTION: insomnia  Sertraline Hcl Other (See Comments)    REACTION: insomnia    Family History  Problem Relation Age of Onset   Lung cancer Mother        smoker   Heart disease Father        Rheumatic Heart Disease   Heart attack Brother 58       CBAG   Lung cancer Maternal Aunt        smoker   Heart attack Paternal Grandmother 52   Heart attack Paternal Grandfather 68   Diabetes Neg Hx    Stroke Neg Hx    Colon cancer Neg Hx    Pancreatic cancer Neg Hx    Rectal cancer Neg Hx    Stomach cancer Neg Hx      Prior to Admission medications   Medication Sig Start Date End Date Taking? Authorizing Provider  Acetaminophen  500 MG coapsule Take 1 capsule by mouth 4 (four) times daily as needed for fever.    [provider]  allopurinol (ZYLOPRIM) 300 MG tablet Take 450 mg by mouth daily.     [provider]  aspirin EC 81 MG tablet Take 81 mg by mouth daily.    [provider]  busPIRone (BUSPAR) 10 MG tablet TAKE ONE TABLET TWICE A DAY 02/04/20 02/04/21  [provider]  carvedilol (COREG) 25 MG tablet Take 1 tablet (25 mg total) by mouth 2 (two) times daily with a meal. 01/26/20   Dorothy Spark, MD  cetirizine (ZYRTEC) 10 MG tablet Take 10 mg by mouth daily.    [provider]  Cholecalciferol (VITAMIN D PO) Take 5,000 Units by mouth daily.     [provider]  clotrimazole (LOTRIMIN) 1 % cream Apply 1 application topically as directed.    [provider]  Coenzyme Q10 (CO Q 10 PO) Take 1 capsule by mouth daily.     [provider]  cyclobenzaprine (FLEXERIL) 10 MG tablet Take 1 tablet (10 mg total) by mouth 3 (three) times daily as needed for muscle spasms. 10/05/17   Shuford, Olivia Mackie, PA-C  doxazosin (CARDURA) 8 MG tablet Take 4 mg by mouth daily.    [provider]  fenofibrate 160 MG tablet Take 1 tablet (160 mg total) by mouth daily. 02/23/20   Dorothy Spark, MD  furosemide (LASIX) 40 MG tablet Take 1 tablet (40 mg total) by mouth 2 (two) times daily. 06/30/16   Dorothy Spark, MD  gabapentin (NEURONTIN) 300 MG capsule Take 300 mg by mouth daily as needed (pain).  06/26/16   [provider]  HYDROcodone-acetaminophen (NORCO) 7.5-325 MG tablet Take 1 tablet by mouth as needed.     [provider]  hydrocortisone 2.5 % cream Apply 1 application topically daily as needed.    [provider]  KETOCONAZOLE, TOPICAL, 1 % SHAM Apply 1 application topically See admin instructions. Every 2-3 days when he washes hair.    [provider]  levothyroxine (SYNTHROID, LEVOTHROID) 50 MCG tablet Take 50  mcg by mouth daily before breakfast.    [provider]  Menthol-Methyl Salicylate (THERA-GESIC EX) Apply 1 application topically 4 (four) times daily as needed (knee pain.).     [provider]  metFORMIN (GLUCOPHAGE) 500 MG tablet Take 500 mg by mouth 2 (two) times daily with a meal.    [provider]  mirtazapine (REMERON) 30 MG tablet Take 30 mg by mouth at bedtime.    [provider]  Multiple Vitamin (MULTIVITAMIN WITH MINERALS) TABS tablet Take 1 tablet by mouth daily. PACKET    [provider]  Omega-3 Fatty Acids (FISH OIL) 1000 MG CAPS Take 1 capsule by mouth daily.    [provider]  omeprazole (PRILOSEC) 20 MG capsule Take 40 mg by mouth 2 (two) times daily.     [provider]  potassium chloride (K-DUR) 10 MEQ tablet Take 10 mEq by mouth daily.    [provider]  pravastatin (PRAVACHOL) 20 MG tablet Take 1 tablet (20 mg total) by mouth every evening. 09/04/19 09/03/20  Dorothy Spark, MD  QUEtiapine (SEROQUEL XR) 50 MG TB24 24 hr tablet Take 150 mg by mouth at bedtime.     [provider]  Saw Palmetto, Serenoa repens, (SAW PALMETTO PO) Take 1 capsule by mouth daily.    [provider]  triamcinolone cream (KENALOG) 0.1 % Apply 1 application topically daily as needed (rosacea).     [provider]    Physical Exam: Vitals:   01/16/21 1215 01/16/21 1230 01/16/21 1302 01/16/21 1315  BP: 126/71 128/66 126/74 122/66  Pulse: (!) 101 99 (!) 103 (!) 103  Resp: 20 (!) 26 (!) 28 20  Temp:      TempSrc:      SpO2: 93% 95% 95% 94%    Constitutional: NAD, calm, comfortable Vitals:   01/16/21 1215 01/16/21 1230 01/16/21 1302 01/16/21 1315  BP: 126/71 128/66 126/74 122/66  Pulse: (!) 101 99 (!) 103 (!) 103  Resp: 20 (!) 26 (!) 28 20  Temp:      TempSrc:      SpO2: 93% 95% 95% 94%   Eyes: PERRL, lids and conjunctivae normal ENMT: Mucous membranes are moist. Posterior pharynx  clear of any exudate or lesions.Normal dentition.  Neck: normal, supple, no masses, no thyromegaly Respiratory: clear to auscultation bilaterally, no wheezing, no crackles. Normal respiratory effort. No accessory muscle use.  Cardiovascular: Regular rate and rhythm, no murmurs / rubs / gallops. No extremity edema. 2+ pedal pulses. No carotid bruits.  Abdomen: no tenderness, no masses palpated. No hepatosplenomegaly. Bowel sounds positive.  Musculoskeletal: no clubbing / cyanosis. No joint deformity upper and lower extremities. Good ROM, no contractures. Normal muscle tone.  Skin: no rashes, lesions, ulcers. No induration Neurologic: CN 2-12 grossly intact. Sensation intact, DTR normal. Strength 5/5 in all 4.  Psychiatric: Normal judgment and insight. Alert and oriented to person and place, confused about time. Normal mood.     Labs on Admission: I have personally reviewed following labs and imaging studies  CBC: Recent Labs  Lab 01/16/21 1123  WBC 26.3*  NEUTROABS 22.5*  HGB 14.1  HCT 42.8  MCV 93.2  PLT 654   Basic Metabolic Panel: Recent Labs  Lab 01/16/21 1123  NA 133*  K 3.7  CL 101  CO2 21*  GLUCOSE 166*  BUN 20  CREATININE 1.50*  CALCIUM 9.1   GFR: CrCl cannot be calculated (Unknown ideal weight.). Liver Function Tests: Recent Labs  Lab 01/16/21 1123  AST 34  ALT 25  ALKPHOS 66  BILITOT 1.4*  PROT 6.8  ALBUMIN 3.2*   No results for input(s): LIPASE, AMYLASE in the last 168 hours. No results for input(s): AMMONIA in the last 168 hours. Coagulation Profile: Recent Labs  Lab 01/16/21 1123  INR 1.2   Cardiac Enzymes: No results for input(s): CKTOTAL, CKMB, CKMBINDEX, TROPONINI in the last 168 hours. BNP (last 3 results) No results for input(s): PROBNP  in the last 8760 hours. HbA1C: No results for input(s): HGBA1C in the last 72 hours. CBG: Recent Labs  Lab 01/16/21 1123  GLUCAP 178*   Lipid Profile: No results for input(s): CHOL, HDL, LDLCALC,  TRIG, CHOLHDL, LDLDIRECT in the last 72 hours. Thyroid Function Tests: No results for input(s): TSH, T4TOTAL, FREET4, T3FREE, THYROIDAB in the last 72 hours. Anemia Panel: No results for input(s): VITAMINB12, FOLATE, FERRITIN, TIBC, IRON, RETICCTPCT in the last 72 hours. Urine analysis:    Component Value Date/Time   COLORURINE AMBER (A) 01/16/2021 1055   APPEARANCEUR CLOUDY (A) 01/16/2021 1055   LABSPEC 1.026 01/16/2021 1055   PHURINE 5.0 01/16/2021 1055   GLUCOSEU NEGATIVE 01/16/2021 1055   HGBUR LARGE (A) 01/16/2021 1055   BILIRUBINUR NEGATIVE 01/16/2021 1055   BILIRUBINUR Neg 07/11/2011 1637   KETONESUR NEGATIVE 01/16/2021 1055   PROTEINUR 100 (A) 01/16/2021 1055   UROBILINOGEN 1.0 10/24/2014 0219   NITRITE NEGATIVE 01/16/2021 1055   LEUKOCYTESUR MODERATE (A) 01/16/2021 1055    Radiological Exams on Admission: DG Chest Port 1 View  Result Date: 01/16/2021 CLINICAL DATA:  Sepsis. Burning with urination. Decreased intake. Low-grade fever. Altered mental status. EXAM: PORTABLE CHEST 1 VIEW COMPARISON:  Chest x-rays dated 03/08/2015 and 03/06/2015. FINDINGS: Heart size and mediastinal contours are stable. Lungs are clear. No pleural effusion or pneumothorax is seen. Chronic elevation of the RIGHT hemidiaphragm. No acute-appearing osseous abnormality. IMPRESSION: No active disease. No evidence of pneumonia or pulmonary edema. Electronically Signed   By: Franki Cabot M.D.   On: 01/16/2021 12:00    EKG: Pending  Assessment/Plan Principal Problem:   Encephalopathy  (please populate well all problems here in Problem List. (For example, if patient is on BP meds at home and you resume or decide to hold them, it is a problem that needs to be her. Same for CAD, COPD, HLD and so on)  Sepsis -Evidenced by tachycardia, leukocytosis and source for infection is the UTI. With signs of end organ damage of a new encephalopathy and AKI. -Continue ABX and maintenance IVF for today  UTI -Suspect  underlying BPH -PVR -Family not sure about reason patient taking Prazocin -Will add Flomax  Acute metabolic encephalopathy -2/2 UTI, improving, supportive care.  AKI -Likely 2/2 sepsis -Check Renal U/S -Continue IVF for today and hold Lasix  Chronic diastolic CHF -Signs of hypovolemic, will hold Lasix and continue IVF. -Plan to restart BP meds tomorrow. Re-evaluate volume status and kidney function to restart Lasix tomorrow.  Gout -No acute issue, continue Allopurinol.  Hypothyroid -Synthroid  Anxiety/Depression -Buspar, Remeron and Seroquel  DVT prophylaxis: Lovenox Code Status: Full code Family Communication: Wife and daughter Disposition Plan: Expect less than 2 midnight hospital stay Consults called: None  Admission status: Medsurg admit, with tele x24 hours   Lequita Halt MD [facility] Pager [phone] 01/16/2021, 1:35 PM

## 2021-01-16 NOTE — ED Provider Notes (Signed)
[facility] EMERGENCY DEPARTMENT Provider Note   CSN: [id number] Arrival date & time: 01/16/21  1033     History Chief Complaint  Patient presents with   Altered Mental Status   Dysuria    BERLE FITZ is a 75 y.o. male.  75 year old male with history of CHF, diabetes, hypertension, hyperlipidemia, brought in by family from home with urinary frequency, dysuria, decreased oral intake and change in mental status today.  Patient had a lesion removed from underneath the right side of his tongue 2 days ago, possibly with decreased intake since that time.  Patient's wife at bedside reports fever of 100 last night, given ibuprofen.  Patient's daughter is an Therapist, sports, was contacted this morning and told that he seemed confused.  He was found to have an elevated heart rate in the 120s, was not alert to person or birth date, was given IV fluids and was brought to the emergency room.  On arrival in the emergency room, is aware of name, may be some improvement in condition overall. Unsure if he has taken his meds lately. No reported falls but family states he would not tell them if he had fallen, no obvious injuries.       Past Medical History:  Diagnosis Date   Allergy    Arthritis    CHF (congestive heart failure) ([place])    hospitalized in 2017; was diagnosed with an acute excacerbation.    Colon polyps    adenomatous   Diabetes mellitus    GERD (gastroesophageal reflux disease)    Gout    History of basal cell carcinoma    W-S Derm   History of squamous cell carcinoma    Hyperlipidemia    Hypertension    Obesity    Rosacea    Tachycardia     Patient Active Problem List   Diagnosis Date Noted   History of squamous cell carcinoma    Status post total shoulder arthroplasty 10/04/2017   Pain in left knee 06/08/2017   Osteoarthritis of glenohumeral joint 05/21/2017   Esophageal candidiasis ([place]) 25/36/6440   Acute diastolic CHF (congestive heart failure) ([place]) 03/13/2015    Sinus tachycardia 03/10/2015   Hypothyroidism 03/10/2015   Hypoxemia    Uncontrolled diabetes mellitus type 2 without complications 34/74/2595   Chest pain on breathing    Intractable nausea and vomiting 03/06/2015   Abdominal pain 03/06/2015   Severe obesity (BMI >= 40) (HCC) 05/21/2013   Chronic sphenoidal sinusitis 05/21/2013   Hypokalemia 05/21/2013   Sinusitis, chronic 12/19/2012   History of anesthesia reaction 03/06/2012   Testosterone deficiency 03/06/2012   Type II or unspecified type diabetes mellitus without mention of complication, not stated as uncontrolled 12/26/2010   FATIGUE 04/26/2010   EDEMA 04/26/2010   PAROXYSMAL NOCTURNAL DYSPNEA 04/26/2010   ESOPHAGEAL STRICTURE 07/17/2008   FATTY LIVER DISEASE 07/17/2008   Esophageal dysphagia 07/17/2008   Essential hypertension, benign 07/02/2008   ANGIOEDEMA 06/08/2008   UNSPECIFIED ANEMIA 05/25/2008   URTICARIA 05/25/2008   HYPONATREMIA 05/03/2008   RENAL INSUFFICIENCY 05/03/2008   Hyperlipidemia 11/05/2007   NONSPEC ELEVATION OF LEVELS OF TRANSAMINASE/LDH 11/05/2007   SKIN CANCER, HX OF 11/05/2007   GOUT, UNSPECIFIED 63/87/5643   DYSMETABOLIC SYNDROME X 32/95/1884   GERD 02/05/2007    Past Surgical History:  Procedure Laterality Date   colonoscopy with polypectomy     X3   ESOPHAGOGASTRODUODENOSCOPY N/A 03/10/2015   Procedure: ESOPHAGOGASTRODUODENOSCOPY (EGD);  Surgeon: Ladene Artist, MD;  Location: [facility];  Service: Endoscopy;  Laterality: N/A;   esophagus dilated     X3   EUS N/A 05/20/2015   Procedure: UPPER ENDOSCOPIC ULTRASOUND (EUS) RADIAL;  Surgeon: Milus Banister, MD;  Location: [facility];  Service: Endoscopy;  Laterality: N/A;   KNEE ARTHROSCOPY     bilaterally   PILONIDAL CYST EXCISION     REPLACEMENT TOTAL KNEE Right    R   SHOULDER SURGERY     right shoulder   TONSILLECTOMY     total ethmoidectomy & sphenoidectomiy Bilateral 05/27/13   also reduction of turbinates   TOTAL KNEE  ARTHROPLASTY     RIGHT   TOTAL SHOULDER ARTHROPLASTY Left 10/04/2017   Procedure: LEFT TOTAL SHOULDER ARTHROPLASTY;  Surgeon: Justice Britain, MD;  Location: [facility];  Service: Orthopedics;  Laterality: Left;       Family History  Problem Relation Age of Onset   Lung cancer Mother        smoker   Heart disease Father        Rheumatic Heart Disease   Heart attack Brother 8       CBAG   Lung cancer Maternal Aunt        smoker   Heart attack Paternal Grandmother 64   Heart attack Paternal Grandfather 4   Diabetes Neg Hx    Stroke Neg Hx    Colon cancer Neg Hx    Pancreatic cancer Neg Hx    Rectal cancer Neg Hx    Stomach cancer Neg Hx     Social History   Tobacco Use   Smoking status: Former    Types: Cigarettes    Quit date: 10/17/1973    Years since quitting: 47.2   Smokeless tobacco: Never  Vaping Use   Vaping Use: Never used  Substance Use Topics   Alcohol use: No    Alcohol/week: 0.0 standard drinks   Drug use: No    Home Medications Prior to Admission medications   Medication Sig Start Date End Date Taking? Authorizing Provider  Acetaminophen 500 MG coapsule Take 1 capsule by mouth 4 (four) times daily as needed for fever.    [provider]  allopurinol (ZYLOPRIM) 300 MG tablet Take 450 mg by mouth daily.     [provider]  aspirin EC 81 MG tablet Take 81 mg by mouth daily.    [provider]  busPIRone (BUSPAR) 10 MG tablet TAKE ONE TABLET TWICE A DAY 02/04/20 02/04/21  [provider]  carvedilol (COREG) 25 MG tablet Take 1 tablet (25 mg total) by mouth 2 (two) times daily with a meal. 01/26/20   Dorothy Spark, MD  cetirizine (ZYRTEC) 10 MG tablet Take 10 mg by mouth daily.    [provider]  Cholecalciferol (VITAMIN D PO) Take 5,000 Units by mouth daily.     [provider]  clotrimazole (LOTRIMIN) 1 % cream Apply 1 application topically as directed.    [provider]  Coenzyme Q10 (CO Q  10 PO) Take 1 capsule by mouth daily.     [provider]  cyclobenzaprine (FLEXERIL) 10 MG tablet Take 1 tablet (10 mg total) by mouth 3 (three) times daily as needed for muscle spasms. 10/05/17   Shuford, Olivia Mackie, PA-C  doxazosin (CARDURA) 8 MG tablet Take 4 mg by mouth daily.    [provider]  fenofibrate 160 MG tablet Take 1 tablet (160 mg total) by mouth daily. 02/23/20   Dorothy Spark, MD  furosemide (  LASIX) 40 MG tablet Take 1 tablet (40 mg total) by mouth 2 (two) times daily. 06/30/16   Dorothy Spark, MD  gabapentin (NEURONTIN) 300 MG capsule Take 300 mg by mouth daily as needed (pain).  06/26/16   [provider]  HYDROcodone-acetaminophen (NORCO) 7.5-325 MG tablet Take 1 tablet by mouth as needed.     [provider]  hydrocortisone 2.5 % cream Apply 1 application topically daily as needed.    [provider]  KETOCONAZOLE, TOPICAL, 1 % SHAM Apply 1 application topically See admin instructions. Every 2-3 days when he washes hair.    [provider]  levothyroxine (SYNTHROID, LEVOTHROID) 50 MCG tablet Take 50 mcg by mouth daily before breakfast.    [provider]  Menthol-Methyl Salicylate (THERA-GESIC EX) Apply 1 application topically 4 (four) times daily as needed (knee pain.).     [provider]  metFORMIN (GLUCOPHAGE) 500 MG tablet Take 500 mg by mouth 2 (two) times daily with a meal.    [provider]  mirtazapine (REMERON) 30 MG tablet Take 30 mg by mouth at bedtime.    [provider]  Multiple Vitamin (MULTIVITAMIN WITH MINERALS) TABS tablet Take 1 tablet by mouth daily. PACKET    [provider]  Omega-3 Fatty Acids (FISH OIL) 1000 MG CAPS Take 1 capsule by mouth daily.    [provider]  omeprazole (PRILOSEC) 20 MG capsule Take 40 mg by mouth 2 (two) times daily.     [provider]  potassium chloride (K-DUR) 10 MEQ tablet Take 10 mEq by mouth daily.     [provider]  pravastatin (PRAVACHOL) 20 MG tablet Take 1 tablet (20 mg total) by mouth every evening. 09/04/19 09/03/20  Dorothy Spark, MD  QUEtiapine (SEROQUEL XR) 50 MG TB24 24 hr tablet Take 150 mg by mouth at bedtime.     [provider]  Saw Palmetto, Serenoa repens, (SAW PALMETTO PO) Take 1 capsule by mouth daily.    [provider]  triamcinolone cream (KENALOG) 0.1 % Apply 1 application topically daily as needed (rosacea).     [provider]    Allergies    Sulfa antibiotics, Bupropion hcl, Codeine, Escitalopram oxalate, and Sertraline hcl  Review of Systems   Review of Systems  Unable to perform ROS: Mental status change  Constitutional:  Positive for fever.  Genitourinary:  Positive for dysuria and frequency.  Allergic/Immunologic: Positive for immunocompromised state.  Psychiatric/Behavioral:  Positive for confusion.    Physical Exam Updated Vital Signs BP 122/66   Pulse (!) 103   Temp 99.4 F (37.4 C) (Rectal)   Resp 20   SpO2 94%   Physical Exam Vitals and nursing note reviewed.  Constitutional:      General: He is not in acute distress.    Appearance: He is well-developed. He is not diaphoretic.     Comments: Awake, alert to name only. Follows simple commands.   HENT:     Head: Normocephalic and atraumatic.     Nose: Nose normal.     Mouth/Throat:     Mouth: Mucous membranes are moist.  Eyes:     Extraocular Movements: Extraocular movements intact.     Pupils: Pupils are equal, round, and reactive to light.  Cardiovascular:     Rate and Rhythm: Regular rhythm. Tachycardia present.     Heart sounds: Normal heart sounds.  Pulmonary:     Effort: Pulmonary effort is normal.  Breath sounds: Normal breath sounds.  Abdominal:     Palpations: Abdomen is soft.     Tenderness: There is no abdominal tenderness.  Musculoskeletal:     Cervical back: Neck supple.  Skin:    General: Skin is warm and dry.     Findings:  No erythema or rash.  Neurological:     Mental Status: He is disoriented.     Sensory: No sensory deficit.     Motor: No weakness.  Psychiatric:        Behavior: Behavior normal.    ED Results / Procedures / Treatments   Labs (all labs ordered are listed, but only abnormal results are displayed) Labs Reviewed  COMPREHENSIVE METABOLIC PANEL - Abnormal; Notable for the following components:      Result Value   Sodium 133 (*)    CO2 21 (*)    Glucose, Bld 166 (*)    Creatinine, Ser 1.50 (*)    Albumin 3.2 (*)    Total Bilirubin 1.4 (*)    GFR, Estimated 48 (*)    All other components within normal limits  CBC WITH DIFFERENTIAL/PLATELET - Abnormal; Notable for the following components:   WBC 26.3 (*)    Neutro Abs 22.5 (*)    Monocytes Absolute 2.2 (*)    Abs Immature Granulocytes 0.51 (*)    All other components within normal limits  URINALYSIS, ROUTINE W REFLEX MICROSCOPIC - Abnormal; Notable for the following components:   Color, Urine AMBER (*)    APPearance CLOUDY (*)    Hgb urine dipstick LARGE (*)    Protein, ur 100 (*)    Leukocytes,Ua MODERATE (*)    RBC / HPF >50 (*)    WBC, UA >50 (*)    Bacteria, UA RARE (*)    Non Squamous Epithelial 0-5 (*)    All other components within normal limits  CBG MONITORING, ED - Abnormal; Notable for the following components:   Glucose-Capillary 178 (*)    All other components within normal limits  CULTURE, BLOOD (ROUTINE X 2)  CULTURE, BLOOD (ROUTINE X 2)  URINE CULTURE  RESP PANEL BY RT-PCR (FLU A&B, COVID) ARPGX2  LACTIC ACID, PLASMA  PROTIME-INR  APTT    EKG None  Radiology DG Chest Port 1 View  Result Date: 01/16/2021 CLINICAL DATA:  Sepsis. Burning with urination. Decreased intake. Low-grade fever. Altered mental status. EXAM: PORTABLE CHEST 1 VIEW COMPARISON:  Chest x-rays dated 03/08/2015 and 03/06/2015. FINDINGS: Heart size and mediastinal contours are stable. Lungs are clear. No pleural effusion or pneumothorax  is seen. Chronic elevation of the RIGHT hemidiaphragm. No acute-appearing osseous abnormality. IMPRESSION: No active disease. No evidence of pneumonia or pulmonary edema. Electronically Signed   By: Franki Cabot M.D.   On: 01/16/2021 12:00    Procedures Procedures   Medications Ordered in ED Medications  sodium chloride 0.9 % bolus 500 mL (0 mLs Intravenous Stopped 01/16/21 1303)  cefTRIAXone (ROCEPHIN) 2 g in sodium chloride 0.9 % 100 mL IVPB (0 g Intravenous Stopped 01/16/21 1304)  sodium chloride 0.9 % bolus 500 mL (0 mLs Intravenous Stopped 01/16/21 1304)    ED Course  I have reviewed the triage vital signs and the nursing notes.  Pertinent labs & imaging results that were available during my care of the patient were reviewed by me and considered in my medical decision making (see chart for details).  Clinical Course as of 01/16/21 1330  Sun Jan 17, 8544  2872 75 year old  male presents with altered mental status, possible fever and urinary symptoms as above. On exam, follows simple commands, it alert to person only.  Sepsis orders initiated with gentle hydration for CHF history, EF 55-60% on echo from earlier this year.  [LM]  1328 Patient is found have leukocytosis white count of 26,000 with predominantly neutrophils.  CMP with mildly elevated creatinine at 1.5.  Urinalysis concerning for UTI with moderate leukocytes, greater than 50 white blood cells and red blood cells, sent for culture.  Blood cultures pending as well.  Initial lactic acid is reassuring at 1.7.  Patient was started on Rocephin. Patient did have COVID at the end of October, COVID/flu swab obtained to evaluate for flu contributing to patient's symptoms today and is pending. Patient and family updated with results and plan of care. Case discussed with Dr. Roosevelt Locks with [facility] will consult for admission. [LM]  1410 CT head pending for altered mental status. On reexam, mental status improving, patient  still appears to feel unwell however agrees he is feeling somewhat better. [LM]    Clinical Course User Index [LM] Roque Lias   MDM Rules/Calculators/A&P                           Final Clinical Impression(s) / ED Diagnoses Final diagnoses:  Sepsis without acute organ dysfunction, due to unspecified organism [facility])    Rx / DC Orders ED Discharge Orders     None        Tacy Learn, PA-C 01/16/21 1330    Pattricia Boss, MD 01/18/21 1204

## 2021-01-16 NOTE — ED Triage Notes (Signed)
Pt had nodule removed from mouth on Friday. Reports burning with urination and decreased intake since yesterday.  Low grade fever last night.  This morning he couldn't remember his age or what year it is.  Frequent urination.

## 2021-01-17 DIAGNOSIS — Z885 Allergy status to narcotic agent status: Secondary | ICD-10-CM | POA: Diagnosis not present

## 2021-01-17 DIAGNOSIS — G928 Other toxic encephalopathy: Secondary | ICD-10-CM | POA: Diagnosis present

## 2021-01-17 DIAGNOSIS — N1831 Chronic kidney disease, stage 3a: Secondary | ICD-10-CM | POA: Diagnosis present

## 2021-01-17 DIAGNOSIS — G9340 Encephalopathy, unspecified: Secondary | ICD-10-CM | POA: Diagnosis not present

## 2021-01-17 DIAGNOSIS — I130 Hypertensive heart and chronic kidney disease with heart failure and stage 1 through stage 4 chronic kidney disease, or unspecified chronic kidney disease: Secondary | ICD-10-CM | POA: Diagnosis present

## 2021-01-17 DIAGNOSIS — G9341 Metabolic encephalopathy: Secondary | ICD-10-CM | POA: Diagnosis present

## 2021-01-17 DIAGNOSIS — N2889 Other specified disorders of kidney and ureter: Secondary | ICD-10-CM | POA: Diagnosis present

## 2021-01-17 DIAGNOSIS — Z20822 Contact with and (suspected) exposure to covid-19: Secondary | ICD-10-CM | POA: Diagnosis present

## 2021-01-17 DIAGNOSIS — N400 Enlarged prostate without lower urinary tract symptoms: Secondary | ICD-10-CM | POA: Diagnosis present

## 2021-01-17 DIAGNOSIS — Z6838 Body mass index (BMI) 38.0-38.9, adult: Secondary | ICD-10-CM | POA: Diagnosis not present

## 2021-01-17 DIAGNOSIS — F32A Depression, unspecified: Secondary | ICD-10-CM | POA: Diagnosis present

## 2021-01-17 DIAGNOSIS — M109 Gout, unspecified: Secondary | ICD-10-CM | POA: Diagnosis present

## 2021-01-17 DIAGNOSIS — E1122 Type 2 diabetes mellitus with diabetic chronic kidney disease: Secondary | ICD-10-CM | POA: Diagnosis present

## 2021-01-17 DIAGNOSIS — Z888 Allergy status to other drugs, medicaments and biological substances status: Secondary | ICD-10-CM | POA: Diagnosis not present

## 2021-01-17 DIAGNOSIS — K219 Gastro-esophageal reflux disease without esophagitis: Secondary | ICD-10-CM | POA: Diagnosis present

## 2021-01-17 DIAGNOSIS — F419 Anxiety disorder, unspecified: Secondary | ICD-10-CM | POA: Diagnosis present

## 2021-01-17 DIAGNOSIS — Z85828 Personal history of other malignant neoplasm of skin: Secondary | ICD-10-CM | POA: Diagnosis not present

## 2021-01-17 DIAGNOSIS — A4151 Sepsis due to Escherichia coli [E. coli]: Secondary | ICD-10-CM | POA: Diagnosis present

## 2021-01-17 DIAGNOSIS — N179 Acute kidney failure, unspecified: Secondary | ICD-10-CM | POA: Diagnosis present

## 2021-01-17 DIAGNOSIS — E039 Hypothyroidism, unspecified: Secondary | ICD-10-CM | POA: Diagnosis present

## 2021-01-17 DIAGNOSIS — E785 Hyperlipidemia, unspecified: Secondary | ICD-10-CM | POA: Diagnosis present

## 2021-01-17 DIAGNOSIS — E876 Hypokalemia: Secondary | ICD-10-CM | POA: Diagnosis present

## 2021-01-17 DIAGNOSIS — I5032 Chronic diastolic (congestive) heart failure: Secondary | ICD-10-CM | POA: Diagnosis present

## 2021-01-17 DIAGNOSIS — E669 Obesity, unspecified: Secondary | ICD-10-CM | POA: Diagnosis present

## 2021-01-17 DIAGNOSIS — Z882 Allergy status to sulfonamides status: Secondary | ICD-10-CM | POA: Diagnosis not present

## 2021-01-17 DIAGNOSIS — N390 Urinary tract infection, site not specified: Secondary | ICD-10-CM | POA: Diagnosis present

## 2021-01-17 LAB — BASIC METABOLIC PANEL
Anion gap: 7 (ref 5–15)
BUN: 13 mg/dL (ref 8–23)
CO2: 22 mmol/L (ref 22–32)
Calcium: 8.4 mg/dL — ABNORMAL LOW (ref 8.9–10.3)
Chloride: 106 mmol/L (ref 98–111)
Creatinine, Ser: 1.31 mg/dL — ABNORMAL HIGH (ref 0.61–1.24)
GFR, Estimated: 56 mL/min — ABNORMAL LOW (ref >60)
Glucose, Bld: 120 mg/dL — ABNORMAL HIGH (ref 70–99)
Potassium: 3.4 mmol/L — ABNORMAL LOW (ref 3.5–5.1)
Sodium: 135 mmol/L (ref 135–145)

## 2021-01-17 LAB — CBC
HCT: 37.5 % — ABNORMAL LOW (ref 39.0–52.0)
Hemoglobin: 12.6 g/dL — ABNORMAL LOW (ref 13.0–17.0)
MCH: 31.3 pg (ref 26.0–34.0)
MCHC: 33.6 g/dL (ref 30.0–36.0)
MCV: 93.1 fL (ref 80.0–100.0)
Platelets: 181 10*3/uL (ref 150–400)
RBC: 4.03 MIL/uL — ABNORMAL LOW (ref 4.22–5.81)
RDW: 14.8 % (ref 11.5–15.5)
WBC: 18.2 10*3/uL — ABNORMAL HIGH (ref 4.0–10.5)
nRBC: 0 % (ref 0.0–0.2)

## 2021-01-17 LAB — CBG MONITORING, ED
Glucose-Capillary: 180 mg/dL — ABNORMAL HIGH (ref 70–99)
Glucose-Capillary: 135 mg/dL — ABNORMAL HIGH (ref 70–99)

## 2021-01-17 LAB — PROCALCITONIN: Procalcitonin: 0.37 ng/mL

## 2021-01-17 LAB — GLUCOSE, CAPILLARY: Glucose-Capillary: 153 mg/dL — ABNORMAL HIGH (ref 70–99)

## 2021-01-17 MED ORDER — POTASSIUM CHLORIDE CRYS ER 20 MEQ PO TBCR
40.0000 meq | EXTENDED_RELEASE_TABLET | Freq: Once | ORAL | Status: AC
  Administered 2021-01-17: 40 meq via ORAL
  Filled 2021-01-17: qty 2

## 2021-01-17 NOTE — ED Notes (Signed)
Daughter at [facility]. Admitting in to see, at [facility].

## 2021-01-17 NOTE — Progress Notes (Signed)
PROGRESS NOTE                                                                                                                                                                                                             Patient Demographics:    Terry Lyons, is a 75 y.o. male, DOB - 1945/12/29, FTD:[id number]  Outpatient Primary MD for the patient is Crist Infante, MD    LOS - 0  Admit date - 01/16/2021    Chief Complaint  Patient presents with   Altered Mental Status   Dysuria       Brief Narrative (HPI from H&P)  Terry Lyons is a 75 y.o. male with medical history significant of chronic diastolic CHF, HTN, IIDM, HLD, anxiety/depression, Gout, hypothyroid, presented with dysuria, fever and confusion.     Subjective:    Terry Lyons today has, No headache, No chest pain, No abdominal pain - No Nausea, No new weakness tingling or numbness, no SOB.   Assessment  & Plan :    Sepsis due to UTI, underlying BPH with AKI and toxic encephalopathy - he has been placed on empiric IV antibiotic along with IV fluids with improvement, sepsis pathophysiology has improved, encephalopathy has resolved, AKI improving.  Follow cultures and monitor.  2.  BPH.  Placed on Flomax.  Will require outpatient urology follow-up.  3.  Right kidney mass.  Outpatient MRI and urology follow-up  4.  Chronic diastolic CHF.  EF in January 2022 60%.  Currently compensated  5.  Hypokalemia.  Replaced.  6.  Hypertension.  On Coreg and stable.  7.  Dyslipidemia. On statin.  8.  Hypothyroidism.  Continue Synthroid.  9.  Anxiety and depression.  Continue combination of BuSpar, Remeron and Seroquel.  10.  Gout.  Continue allopurinol no acute issues.  11.  DM type II.  Sliding scale.  Lab Results  Component Value Date   HGBA1C 5.8 (H) 01/16/2021   CBG (last 3)  Recent Labs    01/16/21 1712 01/17/21 1013 01/17/21 1125  GLUCAP 133* 180* 135*          Condition - Fair  Family Communication  :  daughter bedside 01/17/21  Code Status :  Full  Consults  :  None  PUD Prophylaxis : PPI   Procedures  :  Renal US - 3 x 2.7 x 3 cm heterogeneous solid lesion within the right kidney  Head CT - Non acute      Disposition Plan  :    Status is: Observation  DVT Prophylaxis  :    enoxaparin (LOVENOX) injection 40 mg Start: 01/16/21 1345   Lab Results  Component Value Date   PLT 181 01/17/2021    Diet :  Diet Order             Diet full liquid Room service appropriate? Yes; Fluid consistency: Thin  Diet effective now                    Inpatient Medications  Scheduled Meds:  allopurinol  450 mg Oral Daily   aspirin EC  81 mg Oral Daily   busPIRone  10 mg Oral BID   carvedilol  25 mg Oral BID WC   doxazosin  4 mg Oral Daily   enoxaparin (LOVENOX) injection  40 mg Subcutaneous Q24H   fenofibrate  160 mg Oral Daily   insulin aspart  0-9 Units Subcutaneous TID WC   levothyroxine  50 mcg Oral QAC breakfast   loratadine  10 mg Oral Daily   mirtazapine  30 mg Oral QHS   pantoprazole  40 mg Oral Daily   pravastatin  20 mg Oral QPM   QUEtiapine  150 mg Oral QHS   tamsulosin  0.4 mg Oral QPC supper   Continuous Infusions:  cefTRIAXone (ROCEPHIN)  IV Stopped (01/17/21 1025)   PRN Meds:.acetaminophen **OR** acetaminophen, cyclobenzaprine, ondansetron **OR** ondansetron (ZOFRAN) IV  Antibiotics  :    Anti-infectives (From admission, onward)    Start     Dose/Rate Route Frequency Ordered Stop   01/17/21 1000  cefTRIAXone (ROCEPHIN) 2 g in sodium chloride 0.9 % 100 mL IVPB        2 g 200 mL/hr over 30 Minutes Intravenous Every 24 hours 01/16/21 1342     01/16/21 1215  cefTRIAXone (ROCEPHIN) 2 g in sodium chloride 0.9 % 100 mL IVPB        2 g 200 mL/hr over 30 Minutes Intravenous  Once 01/16/21 1210 01/16/21 1304        Time Spent in minutes  30   Lala Lund M.D on 01/17/2021 at 1:00 PM  To page  go to [email or web address]   [facility] -  Office  [phone]  See all Orders from today for further details    Objective:   Vitals:   01/17/21 0017 01/17/21 0344 01/17/21 0345 01/17/21 1126  BP: 122/75 115/72 110/76 118/85  Pulse: 98 (!) 101  84  Resp: 16 18  20   Temp:  98.4 F (36.9 C)  98.5 F (36.9 C)  TempSrc:  Oral  Oral  SpO2: 95% 98%  96%  Weight:      Height:        Wt Readings from Last 3 Encounters:  01/16/21 115.7 kg  08/04/20 122 kg  01/26/20 127.6 kg     Intake/Output Summary (Last 24 hours) at 01/17/2021 1300 Last data filed at 01/17/2021 1158 Gross per 24 hour  Intake 2949.06 ml  Output 1935 ml  Net 1014.06 ml     Physical Exam  Awake Alert, No new F.N deficits, Normal affect [place].AT,PERRAL Supple Neck, No JVD,   Symmetrical Chest wall movement, Good air movement bilaterally, CTAB RRR,No Gallops,Rubs or new Murmurs,  +ve B.Sounds, Abd Soft, No tenderness,   No Cyanosis, Clubbing or  edema       Data Review:    CBC Recent Labs  Lab 01/16/21 1123 01/17/21 0548  WBC 26.3* 18.2*  HGB 14.1 12.6*  HCT 42.8 37.5*  PLT 227 181  MCV 93.2 93.1  MCH 30.7 31.3  MCHC 32.9 33.6  RDW 14.7 14.8  LYMPHSABS 1.1  --   MONOABS 2.2*  --   EOSABS 0.1  --   BASOSABS 0.1  --     Electrolytes Recent Labs  Lab 01/16/21 1123 01/17/21 0548  NA 133* 135  K 3.7 3.4*  CL 101 106  CO2 21* 22  GLUCOSE 166* 120*  BUN 20 13  CREATININE 1.50* 1.31*  CALCIUM 9.1 8.4*  AST 34  --   ALT 25  --   ALKPHOS 66  --   BILITOT 1.4*  --   ALBUMIN 3.2*  --   LATICACIDVEN 1.7  --   INR 1.2  --   HGBA1C 5.8*  --     ------------------------------------------------------------------------------------------------------------------ No results for input(s): CHOL, HDL, LDLCALC, TRIG, CHOLHDL, LDLDIRECT in the last 72 hours.  Lab Results  Component Value Date   HGBA1C 5.8 (H) 01/16/2021    No results for input(s): TSH, T4TOTAL, T3FREE, THYROIDAB in the  last 72 hours.  Invalid input(s): FREET3 ------------------------------------------------------------------------------------------------------------------ ID Labs Recent Labs  Lab 01/16/21 1123 01/17/21 0548  WBC 26.3* 18.2*  PLT 227 181  LATICACIDVEN 1.7  --   CREATININE 1.50* 1.31*   Cardiac Enzymes No results for input(s): CKMB, TROPONINI, MYOGLOBIN in the last 168 hours.  Invalid input(s): CK      Radiology Reports CT Head Wo Contrast  Result Date: 01/16/2021 CLINICAL DATA:  Mental status change.  Low-grade fever last night. EXAM: CT HEAD WITHOUT CONTRAST TECHNIQUE: Contiguous axial images were obtained from the base of the skull through the vertex without intravenous contrast. COMPARISON:  Head CT dated 05/10/2013. FINDINGS: Brain: Mild generalized age related parenchymal volume loss with commensurate dilatation of the ventricles and sulci. Mild chronic small vessel ischemic changes within the bilateral periventricular white matter regions. No mass, hemorrhage, edema or other evidence of acute parenchymal abnormality. No extra-axial hemorrhage. Vascular: Chronic calcified atherosclerotic changes of the large vessels at the skull base. No unexpected hyperdense vessel. Skull: Normal. Negative for fracture or focal lesion. Sinuses/Orbits: No acute findings. Other: None. IMPRESSION: 1. No acute findings. No intracranial mass, hemorrhage or edema. 2. Mild chronic small vessel ischemic changes in the white matter. Electronically Signed   By: Franki Cabot M.D.   On: 01/16/2021 13:35   US RENAL  Result Date: 01/16/2021 CLINICAL DATA:  AKI EXAM: RENAL / URINARY TRACT ULTRASOUND COMPLETE COMPARISON:  CT abdomen pelvis 03/09/2015 FINDINGS: Right Kidney: Renal measurements: 11 x 5.8 x 5.4 cm = volume: 177 mL. Echogenicity within normal limits. There is a 3 x 2.7 x 3 cm heterogeneous solid lesion within the right kidney. Associated vascularity noted. No mass or hydronephrosis visualized. Left  Kidney: Renal measurements: 11.4 x 5.5 x 4.5 cm = volume: 146 mL. Echogenicity within normal limits. No mass or hydronephrosis visualized. Urinary bladder: Decompressed and not visualized. Other: None. IMPRESSION: A 3 x 2.7 x 3 cm heterogeneous solid lesion within the right kidney. Finding concerning for malignancy. Recommend MRI renal protocol for further evaluation. When the patient is clinically stable and able to follow directions and hold their breath (preferably as an outpatient) further evaluation with dedicated abdominal MRI should be considered. Electronically Signed   By: Clelia Croft.D.  On: 01/16/2021 15:37   DG Chest Port 1 View  Result Date: 01/16/2021 CLINICAL DATA:  Sepsis. Burning with urination. Decreased intake. Low-grade fever. Altered mental status. EXAM: PORTABLE CHEST 1 VIEW COMPARISON:  Chest x-rays dated 03/08/2015 and 03/06/2015. FINDINGS: Heart size and mediastinal contours are stable. Lungs are clear. No pleural effusion or pneumothorax is seen. Chronic elevation of the RIGHT hemidiaphragm. No acute-appearing osseous abnormality. IMPRESSION: No active disease. No evidence of pneumonia or pulmonary edema. Electronically Signed   By: Franki Cabot M.D.   On: 01/16/2021 12:00

## 2021-01-17 NOTE — Evaluation (Signed)
Occupational Therapy Evaluation Patient Details Name: Terry Lyons MRN: [id number] DOB: August 21, 1945 Today's Date: 01/17/2021   History of Present Illness Pt is a 75 year old man admitted on 01/16/21 with dysuria, fever, confusion and decreased oral intake. Pt had lesion removed from tongue 2 days prior. PMH: CHF, HTN, DM2, HLD, anxiety, depression, gout, hypothyroidism.   Clinical Impression   Pt was functioning independently prior to admission and resides with his wife who has health issues. Pt presents with improved, but continued impairment in cognition. His daughter is bedside and assisted with answering some questions regarding home set up and PLOF. Pt demonstrates some mild balance deficits. He is overall functioning at a set up to min guard assist. Recommending HHOT unless pt returns to his baseline by discharge. Will follow acutely.      Recommendations for follow up therapy are one component of a multi-disciplinary discharge planning process, led by the attending physician.  Recommendations may be updated based on patient status, additional functional criteria and insurance authorization.   Follow Up Recommendations  Home health OT (no f/u if cognition returns to baseline)    Assistance Recommended at Discharge Intermittent Supervision/Assistance  Functional Status Assessment  Patient has had a recent decline in their functional status and demonstrates the ability to make significant improvements in function in a reasonable and predictable amount of time.  Equipment Recommendations  None recommended by OT    Recommendations for Other Services       Precautions / Restrictions Precautions Precautions: Fall Restrictions Weight Bearing Restrictions: No      Mobility Bed Mobility Overal bed mobility: Needs Assistance Bed Mobility: Supine to Sit     Supine to sit: Supervision     General bed mobility comments: Supervision for safety.    Transfers Overall transfer level:  Needs assistance Equipment used: None Transfers: Sit to/from Stand Sit to Stand: Min guard           General transfer comment: Min guard for safety      Balance Overall balance assessment: Needs assistance Sitting-balance support: No upper extremity supported;Feet supported Sitting balance-Leahy Scale: Good     Standing balance support: No upper extremity supported Standing balance-Leahy Scale: Fair                             ADL either performed or assessed with clinical judgement   ADL Overall ADL's : Needs assistance/impaired Eating/Feeding: Independent;Sitting   Grooming: Standing;Min guard   Upper Body Bathing: Set up;Sitting   Lower Body Bathing: Sit to/from stand;Min guard   Upper Body Dressing : Set up;Sitting   Lower Body Dressing: Sit to/from stand;Min guard   Toilet Transfer: Min guard;Ambulation   Toileting- Clothing Manipulation and Hygiene: Sit to/from stand;Min guard       Functional mobility during ADLs: Min guard       Vision Baseline Vision/History: 1 Wears glasses Ability to See in Adequate Light: 0 Adequate Patient Visual Report: No change from baseline       Perception     Praxis      Pertinent Vitals/Pain Pain Assessment: No/denies pain     Hand Dominance Right   Extremity/Trunk Assessment Upper Extremity Assessment Upper Extremity Assessment: Overall WFL for tasks assessed   Lower Extremity Assessment Lower Extremity Assessment: Defer to PT evaluation   Cervical / Trunk Assessment Cervical / Trunk Assessment: Normal   Communication Communication Communication: No difficulties   Cognition Arousal/Alertness: Awake/alert Behavior During  Therapy: WFL for tasks assessed/performed Overall Cognitive Status: Impaired/Different from baseline Area of Impairment: Memory                     Memory: Decreased short-term memory         General Comments: Pt with some memory deficits and required  correction by his daughter     General Comments  Pt's daughter present throughout    Exercises     Shoulder Instructions      Home Living Family/patient expects to be discharged to:: Private residence Living Arrangements: Spouse/significant other Available Help at Discharge: Family;Available 24 hours/day Type of Home: House Home Access: Stairs to enter [employer] of Steps: 5 Entrance Stairs-Rails: Right;Left Home Layout: One level     Bathroom Shower/Tub: Occupational psychologist: Handicapped height     Home Equipment: [place] - single point;Grab bars - tub/shower;Grab bars - toilet;Shower seat   Additional Comments: Wife has dementia and Parkinson's, pt had become her caregiver in recent years      Prior Functioning/Environment Prior Level of Function : Independent/Modified Independent;Driving               ADLs Comments: has a housekeeper 2x a month        OT Problem List: Impaired balance (sitting and/or standing);Decreased cognition      OT Treatment/Interventions: Self-care/ADL training;Cognitive remediation/compensation;Patient/family education;Balance training    OT Goals(Current goals can be found in the care plan section) Acute Rehab OT Goals OT Goal Formulation: With patient Time For Goal Achievement: 01/31/21 Potential to Achieve Goals: Good  OT Frequency: Min 2X/week   Barriers to D/C:            Co-evaluation              AM-PAC OT "6 Clicks" Daily Activity     Outcome Measure Help from another person eating meals?: None Help from another person taking care of personal grooming?: A Little Help from another person toileting, which includes using toliet, bedpan, or urinal?: A Little Help from another person bathing (including washing, rinsing, drying)?: A Little Help from another person to put on and taking off regular upper body clothing?: None Help from another person to put on and taking off regular lower body  clothing?: A Little 6 Click Score: 20   End of Session Equipment Utilized During Treatment: Gait belt  Activity Tolerance: Patient tolerated treatment well Patient left: in bed;with call bell/phone within reach;with family/visitor present  OT Visit Diagnosis: Unsteadiness on feet (R26.81);Other abnormalities of gait and mobility (R26.89);Muscle weakness (generalized) (M62.81);Other symptoms and signs involving cognitive function                Time: 0037-0488 OT Time Calculation (min): 23 min Charges:  OT General Charges $OT Visit: 1 Visit OT Evaluation $OT Eval Low Complexity: 1 Low  Nestor Lewandowsky, OTR/L Acute Rehabilitation Services Pager: [phone] Office: [phone]  Malka So 01/17/2021, 1:16 PM

## 2021-01-17 NOTE — ED Notes (Signed)
Pt alert, NAD, calm, interactive, sitting upright on edge of bed, family at [facility], pt eating. Demonstrated IS well.

## 2021-01-17 NOTE — Evaluation (Signed)
Physical Therapy Evaluation Patient Details Name: Terry Lyons MRN: [id number] DOB: 1945/05/11 Today's Date: 01/17/2021  History of Present Illness  Pt is a 75 year old man admitted on 01/16/21 with dysuria, fever, confusion and decreased oral intake. Pt had lesion removed from tongue 2 days prior. PMH: CHF, HTN, DM2, HLD, anxiety, depression, gout, hypothyroidism.  Clinical Impression  Pt admitted secondary to problem above with deficits below. Pt requiring min guard A for mobility tasks secondary to mild unsteadiness. Educated about using cane at home to increase safety. Discussed outpatient PT, however, pt requesting to see how he does at home first. Will continue to follow acutely.        Recommendations for follow up therapy are one component of a multi-disciplinary discharge planning process, led by the attending physician.  Recommendations may be updated based on patient status, additional functional criteria and insurance authorization.  Follow Up Recommendations No PT follow up    Assistance Recommended at Discharge Intermittent Supervision/Assistance  Functional Status Assessment Patient has had a recent decline in their functional status and demonstrates the ability to make significant improvements in function in a reasonable and predictable amount of time.  Equipment Recommendations  None recommended by PT    Recommendations for Other Services       Precautions / Restrictions Precautions Precautions: Fall Restrictions Weight Bearing Restrictions: No      Mobility  Bed Mobility Overal bed mobility: Needs Assistance Bed Mobility: Supine to Sit     Supine to sit: Supervision     General bed mobility comments: Supervision for safety.    Transfers Overall transfer level: Needs assistance Equipment used: None Transfers: Sit to/from Stand Sit to Stand: Min guard           General transfer comment: Min guard for safety    Ambulation/Gait Ambulation/Gait  assistance: Min guard Gait Distance (Feet): 150 Feet Assistive device: None Gait Pattern/deviations: Step-through pattern;Decreased stride length Gait velocity: Decreased     General Gait Details: Mild unsteadiness noted and noted mild LOB X2. Pt able to self correct. Min guard for safety. Educated about using cane for increased Doctor, hospital [facility] (Stroke Patients Only)       Balance Overall balance assessment: Needs assistance Sitting-balance support: No upper extremity supported;Feet supported Sitting balance-Leahy Scale: Good     Standing balance support: No upper extremity supported Standing balance-Leahy Scale: Fair                               Pertinent Vitals/Pain Pain Assessment: No/denies pain    Home Living Family/patient expects to be discharged to:: Private residence Living Arrangements: Spouse/significant other Available Help at Discharge: Family Type of Home: House Home Access: Stairs to enter Entrance Stairs-Rails: Psychiatric nurse of Steps: 5   Home Layout: One level Home Equipment: Cane - single point;Grab bars - tub/shower;Grab bars - toilet;Shower seat Additional Comments: Wife has dementia and PArkinson's    Prior Function Prior Level of Function : Independent/Modified Independent;Driving                     Hand Dominance   Dominant Hand: Right    Extremity/Trunk Assessment   Upper Extremity Assessment Upper Extremity Assessment: Defer to OT evaluation    Lower Extremity Assessment Lower Extremity Assessment: Generalized weakness    Cervical /  Trunk Assessment Cervical / Trunk Assessment: Normal  Communication   Communication: No difficulties  Cognition Arousal/Alertness: Awake/alert Behavior During Therapy: WFL for tasks assessed/performed Overall Cognitive Status: Impaired/Different from baseline Area of Impairment: Memory                      Memory: Decreased short-term memory         General Comments: Pt with some memory deficits and required correction by his daughter        General Comments General comments (skin integrity, edema, etc.): Pt's daughter present throughout    Exercises     Assessment/Plan    PT Assessment Patient needs continued PT services  PT Problem List Decreased strength;Decreased balance;Decreased mobility;Decreased cognition;Decreased knowledge of use of DME;Decreased safety awareness       PT Treatment Interventions DME instruction;Gait training;Stair training;Functional mobility training;Therapeutic activities;Therapeutic exercise;Balance training;Patient/family education    PT Goals (Current goals can be found in the Care Plan section)  Acute Rehab PT Goals Patient Stated Goal: to go home PT Goal Formulation: With patient Time For Goal Achievement: 01/31/21 Potential to Achieve Goals: Good    Frequency Min 3X/week   Barriers to discharge        Co-evaluation               AM-PAC PT "6 Clicks" Mobility  Outcome Measure Help needed turning from your back to your side while in a flat bed without using bedrails?: None Help needed moving from lying on your back to sitting on the side of a flat bed without using bedrails?: None Help needed moving to and from a bed to a chair (including a wheelchair)?: A Little Help needed standing up from a chair using your arms (e.g., wheelchair or bedside chair)?: A Little Help needed to walk in hospital room?: A Little Help needed climbing 3-5 steps with a railing? : A Little 6 Click Score: 20    End of Session Equipment Utilized During Treatment: Gait belt Activity Tolerance: Patient tolerated treatment well Patient left: in bed;with call bell/phone within reach;with family/visitor present (on bed in ED) Nurse Communication: Mobility status PT Visit Diagnosis: Unsteadiness on feet (R26.81)    Time: 6578-4696 PT Time  Calculation (min) (ACUTE ONLY): 25 min   Charges:   PT Evaluation $PT Eval Low Complexity: 1 Low          Lou Miner, DPT  Acute Rehabilitation Services  Pager: [phone] Office: [phone]   Rudean Hitt 01/17/2021, 12:06 PM

## 2021-01-17 NOTE — ED Notes (Signed)
Pt up from recliner and back to bed. Meds given. Pt watching soccer on TV. Daughter at [facility]. PT/OT into room.

## 2021-01-18 LAB — PROCALCITONIN: Procalcitonin: 0.48 ng/mL

## 2021-01-18 LAB — CBC WITH DIFFERENTIAL/PLATELET
Abs Immature Granulocytes: 0.03 10*3/uL (ref 0.00–0.07)
Basophils Absolute: 0 10*3/uL (ref 0.0–0.1)
Basophils Relative: 0 %
Eosinophils Absolute: 0.2 10*3/uL (ref 0.0–0.5)
Eosinophils Relative: 2 %
HCT: 35.3 % — ABNORMAL LOW (ref 39.0–52.0)
Hemoglobin: 11.5 g/dL — ABNORMAL LOW (ref 13.0–17.0)
Immature Granulocytes: 0 %
Lymphocytes Relative: 16 %
Lymphs Abs: 1.4 10*3/uL (ref 0.7–4.0)
MCH: 30.3 pg (ref 26.0–34.0)
MCHC: 32.6 g/dL (ref 30.0–36.0)
MCV: 93.1 fL (ref 80.0–100.0)
Monocytes Absolute: 0.9 10*3/uL (ref 0.1–1.0)
Monocytes Relative: 10 %
Neutro Abs: 6.6 10*3/uL (ref 1.7–7.7)
Neutrophils Relative %: 72 %
Platelets: 202 10*3/uL (ref 150–400)
RBC: 3.79 MIL/uL — ABNORMAL LOW (ref 4.22–5.81)
RDW: 15 % (ref 11.5–15.5)
WBC: 9.2 10*3/uL (ref 4.0–10.5)
nRBC: 0 % (ref 0.0–0.2)

## 2021-01-18 LAB — COMPREHENSIVE METABOLIC PANEL
ALT: 21 U/L (ref 0–44)
AST: 25 U/L (ref 15–41)
Albumin: 2.4 g/dL — ABNORMAL LOW (ref 3.5–5.0)
Alkaline Phosphatase: 76 U/L (ref 38–126)
Anion gap: 9 (ref 5–15)
BUN: 17 mg/dL (ref 8–23)
CO2: 22 mmol/L (ref 22–32)
Calcium: 8.3 mg/dL — ABNORMAL LOW (ref 8.9–10.3)
Chloride: 105 mmol/L (ref 98–111)
Creatinine, Ser: 1.34 mg/dL — ABNORMAL HIGH (ref 0.61–1.24)
GFR, Estimated: 55 mL/min — ABNORMAL LOW (ref >60)
Glucose, Bld: 151 mg/dL — ABNORMAL HIGH (ref 70–99)
Potassium: 3.7 mmol/L (ref 3.5–5.1)
Sodium: 136 mmol/L (ref 135–145)
Total Bilirubin: 0.3 mg/dL (ref 0.3–1.2)
Total Protein: 5.5 g/dL — ABNORMAL LOW (ref 6.5–8.1)

## 2021-01-18 LAB — URINE CULTURE: Culture: >=100000 — AB

## 2021-01-18 LAB — GLUCOSE, CAPILLARY: Glucose-Capillary: 133 mg/dL — ABNORMAL HIGH (ref 70–99)

## 2021-01-18 LAB — MAGNESIUM: Magnesium: 1.9 mg/dL (ref 1.7–2.4)

## 2021-01-18 LAB — BRAIN NATRIURETIC PEPTIDE: B Natriuretic Peptide: 66 pg/mL (ref 0.0–100.0)

## 2021-01-18 MED ORDER — CEPHALEXIN 500 MG PO CAPS
500.0000 mg | ORAL_CAPSULE | Freq: Three times a day (TID) | ORAL | 0 refills | Status: AC
Start: 2021-01-18 — End: 2021-01-25

## 2021-01-18 MED ORDER — TAMSULOSIN HCL 0.4 MG PO CAPS: 0.4000 mg | ORAL_CAPSULE | Freq: Every day | ORAL | 0 refills | Status: AC

## 2021-01-18 NOTE — Discharge Instructions (Addendum)
Follow with Primary MD Crist Infante, MD in 7 days   Get CBC, CMP, 2 view Chest X ray -  checked next visit within 1 week by Primary MD    Activity: As tolerated with Full fall precautions use walker/cane & assistance as needed  Disposition Home    Diet: Heart Healthy Low Carb  Special Instructions: If you have smoked or chewed Tobacco  in the last 2 yrs please stop smoking, stop any regular Alcohol  and or any Recreational drug use.  On your next visit with your primary care physician please Get Medicines reviewed and adjusted.  Please request your Prim.MD to go over all Hospital Tests and Procedure/Radiological results at the follow up, please get all Hospital records sent to your Prim MD by signing hospital release before you go home.  If you experience worsening of your admission symptoms, develop shortness of breath, life threatening emergency, suicidal or homicidal thoughts you must seek medical attention immediately by calling 911 or calling your MD immediately  if symptoms less severe.  You Must read complete instructions/literature along with all the possible adverse reactions/side effects for all the Medicines you take and that have been prescribed to you. Take any new Medicines after you have completely understood and accpet all the possible adverse reactions/side effects.

## 2021-01-18 NOTE — Discharge Summary (Signed)
Terry Lyons DIY:[id number] DOB: 1945/05/22 DOA: 01/16/2021  PCP: Crist Infante, MD  Admit date: 01/16/2021  Discharge date: 01/18/2021  Admitted From: Home   Disposition:  Home   Recommendations for Outpatient Follow-up:   Follow up with PCP in 1-2 weeks  PCP Please obtain BMP/CBC, 2 view CXR in 1week,  (see Discharge instructions)   PCP Please follow up on the following pending results:    Home Health: None   Equipment/Devices: None  Consultations: None  Discharge Condition: Stable    CODE STATUS: Full    Diet Recommendation: Heart Healthy Low Carb    Chief Complaint  Patient presents with   Altered Mental Status   Dysuria     Brief history of present illness from the day of admission and additional interim summary    Terry Lyons is a 75 y.o. male with medical history significant of chronic diastolic CHF, HTN, IIDM, HLD, anxiety/depression, Gout, hypothyroid, presented with dysuria, fever and confusion.                                                                   Hospital Course     Sepsis due to pansensitive E. coli UTI, underlying BPH with AKI and toxic encephalopathy - he was placed on IV Rocephin along with IV fluids with improvement, sepsis pathophysiology has resolved, encephalopathy has resolved, AKI improved.  Now close to baseline will be discharged home on oral antibiotics x 7 more days with PCP and Urology follow up.   2.  BPH.  Placed on Flomax.  Will require outpatient urology follow-up.   3.  Right kidney mass.  Outpatient Renal MRI and urology follow-up   4.  Chronic diastolic CHF.  EF in January 2022 60%.  Currently compensated   5.  Hypokalemia.  Replaced.   6.  Hypertension.  On Coreg and stable.   7.  Dyslipidemia. On statin.   8.  Hypothyroidism.  Continue Synthroid.   9.   Anxiety and depression.  Continue combination of BuSpar, Remeron and Seroquel.   10.  Gout.  Continue allopurinol no acute issues.   11.  DM type II.  Home Rx.  12.  AKI on CKD 3A.  Renal function now close to baseline.  PCP to monitor.   Discharge diagnosis     Principal Problem:   Encephalopathy Active Problems:   Acute metabolic encephalopathy    Discharge instructions    Discharge Instructions     Discharge instructions   Complete by: As directed    Follow with Primary MD Crist Infante, MD in 7 days   Get CBC, CMP, 2 view Chest X ray -  checked next visit within 1 week by Primary MD    Activity: As tolerated with Full fall precautions use walker/cane &  assistance as needed  Disposition Home    Diet: Heart Healthy Low Carb  Special Instructions: If you have smoked or chewed Tobacco  in the last 2 yrs please stop smoking, stop any regular Alcohol  and or any Recreational drug use.  On your next visit with your primary care physician please Get Medicines reviewed and adjusted.  Please request your Prim.MD to go over all Hospital Tests and Procedure/Radiological results at the follow up, please get all Hospital records sent to your Prim MD by signing hospital release before you go home.  If you experience worsening of your admission symptoms, develop shortness of breath, life threatening emergency, suicidal or homicidal thoughts you must seek medical attention immediately by calling 911 or calling your MD immediately  if symptoms less severe.  You Must read complete instructions/literature along with all the possible adverse reactions/side effects for all the Medicines you take and that have been prescribed to you. Take any new Medicines after you have completely understood and accpet all the possible adverse reactions/side effects.   Increase activity slowly   Complete by: As directed        Discharge Medications   Allergies as of 01/18/2021       Reactions    Sulfa Antibiotics    UNSPECIFIED REACTION    Bupropion Hcl Other (See Comments)   REACTION: insomnia   Codeine Nausea Only   Escitalopram Oxalate Other (See Comments)   REACTION: insomnia   Sertraline Hcl Other (See Comments)   REACTION: insomnia        Medication List     TAKE these medications    Acetaminophen 500 MG capsule Take 1 capsule by mouth every 6 (six) hours as needed for fever.   allopurinol 300 MG tablet Commonly known as: ZYLOPRIM Take 300 mg by mouth daily.   aspirin EC 81 MG tablet Take 81 mg by mouth daily.   busPIRone 10 MG tablet Commonly known as: BUSPAR TAKE ONE TABLET TWICE A DAY   carvedilol 25 MG tablet Commonly known as: COREG Take 1 tablet (25 mg total) by mouth 2 (two) times daily with a meal.   cephALEXin 500 MG capsule Commonly known as: KEFLEX Take 1 capsule (500 mg total) by mouth 3 (three) times daily for 7 days.   cetirizine 10 MG tablet Commonly known as: ZYRTEC Take 10 mg by mouth daily.   CO Q 10 PO Take 1 capsule by mouth daily.   cyclobenzaprine 10 MG tablet Commonly known as: FLEXERIL Take 1 tablet (10 mg total) by mouth 3 (three) times daily as needed for muscle spasms.   doxazosin 8 MG tablet Commonly known as: CARDURA Take 4 mg by mouth daily.   fenofibrate 160 MG tablet Take 1 tablet (160 mg total) by mouth daily.   Fish Oil 1000 MG Caps Take 1 capsule by mouth daily.   furosemide 40 MG tablet Commonly known as: LASIX Take 1 tablet (40 mg total) by mouth 2 (two) times daily. What changed: when to take this   gabapentin 300 MG capsule Commonly known as: NEURONTIN Take 300 mg by mouth at bedtime.   levothyroxine 50 MCG tablet Commonly known as: SYNTHROID Take 50 mcg by mouth daily before breakfast.   metFORMIN 500 MG tablet Commonly known as: GLUCOPHAGE Take 500 mg by mouth 2 (two) times daily with a meal.   mirtazapine 30 MG tablet Commonly known as: REMERON Take 30 mg by mouth at bedtime.    multivitamin with minerals Tabs  tablet Take 1 tablet by mouth daily. PACKET   omeprazole 20 MG capsule Commonly known as: PRILOSEC Take 40 mg by mouth 2 (two) times daily.   potassium chloride 10 MEQ tablet Commonly known as: KLOR-CON Take 10 mEq by mouth daily.   pravastatin 20 MG tablet Commonly known as: PRAVACHOL Take 1 tablet (20 mg total) by mouth every evening.   QUEtiapine 50 MG Tb24 24 hr tablet Commonly known as: SEROQUEL XR Take 150 mg by mouth at bedtime.   SAW PALMETTO PO Take 1 capsule by mouth daily.   tamsulosin 0.4 MG Caps capsule Commonly known as: FLOMAX Take 1 capsule (0.4 mg total) by mouth daily after supper.   THERA-GESIC EX Apply 1 application topically 4 (four) times daily as needed (knee pain.).   VITAMIN D PO Take 5,000 Units by mouth every other day.         Follow-up Information     Crist Infante, MD. Schedule an appointment as soon as possible for a visit in 1 week(s).   Specialty: Internal Medicine Contact information: [place] [address] [phone]         Alexis Frock, MD. Schedule an appointment as soon as possible for a visit in 1 week(s).   Specialty: Urology Why: Renal mass ? Contact information: [place] [address] [phone]                 Major procedures and Radiology Reports - PLEASE review detailed and final reports thoroughly  -       CT Head Wo Contrast  Result Date: 01/16/2021 CLINICAL DATA:  Mental status change.  Low-grade fever last night. EXAM: CT HEAD WITHOUT CONTRAST TECHNIQUE: Contiguous axial images were obtained from the base of the skull through the vertex without intravenous contrast. COMPARISON:  Head CT dated 05/10/2013. FINDINGS: Brain: Mild generalized age related parenchymal volume loss with commensurate dilatation of the ventricles and sulci. Mild chronic small vessel ischemic changes within the bilateral periventricular white matter regions.  No mass, hemorrhage, edema or other evidence of acute parenchymal abnormality. No extra-axial hemorrhage. Vascular: Chronic calcified atherosclerotic changes of the large vessels at the skull base. No unexpected hyperdense vessel. Skull: Normal. Negative for fracture or focal lesion. Sinuses/Orbits: No acute findings. Other: None. IMPRESSION: 1. No acute findings. No intracranial mass, hemorrhage or edema. 2. Mild chronic small vessel ischemic changes in the white matter. Electronically Signed   By: Franki Cabot M.D.   On: 01/16/2021 13:35   US RENAL  Result Date: 01/16/2021 CLINICAL DATA:  AKI EXAM: RENAL / URINARY TRACT ULTRASOUND COMPLETE COMPARISON:  CT abdomen pelvis 03/09/2015 FINDINGS: Right Kidney: Renal measurements: 11 x 5.8 x 5.4 cm = volume: 177 mL. Echogenicity within normal limits. There is a 3 x 2.7 x 3 cm heterogeneous solid lesion within the right kidney. Associated vascularity noted. No mass or hydronephrosis visualized. Left Kidney: Renal measurements: 11.4 x 5.5 x 4.5 cm = volume: 146 mL. Echogenicity within normal limits. No mass or hydronephrosis visualized. Urinary bladder: Decompressed and not visualized. Other: None. IMPRESSION: A 3 x 2.7 x 3 cm heterogeneous solid lesion within the right kidney. Finding concerning for malignancy. Recommend MRI renal protocol for further evaluation. When the patient is clinically stable and able to follow directions and hold their breath (preferably as an outpatient) further evaluation with dedicated abdominal MRI should be considered. Electronically Signed   By: Iven Finn M.D.   On: 01/16/2021 15:37   DG  Chest Port 1 View  Result Date: 01/16/2021 CLINICAL DATA:  Sepsis. Burning with urination. Decreased intake. Low-grade fever. Altered mental status. EXAM: PORTABLE CHEST 1 VIEW COMPARISON:  Chest x-rays dated 03/08/2015 and 03/06/2015. FINDINGS: Heart size and mediastinal contours are stable. Lungs are clear. No pleural effusion or  pneumothorax is seen. Chronic elevation of the RIGHT hemidiaphragm. No acute-appearing osseous abnormality. IMPRESSION: No active disease. No evidence of pneumonia or pulmonary edema. Electronically Signed   By: Franki Cabot M.D.   On: 01/16/2021 12:00     Today   Subjective    Terry Lyons today has no headache,no chest abdominal pain,no new weakness tingling or numbness, feels much better wants to go home today.     Objective   Blood pressure 129/63, pulse 77, temperature (!) 97.5 F (36.4 C), temperature source Oral, resp. rate 20, height 5\' 8"  (1.727 m), weight 115.7 kg, SpO2 95 %.   Intake/Output Summary (Last 24 hours) at 01/18/2021 1028 Last data filed at 01/18/2021 0806 Gross per 24 hour  Intake 120 ml  Output 1395 ml  Net -1275 ml    Exam  Awake Alert, No new F.N deficits, Normal affect [place].AT,PERRAL Supple Neck,No JVD, No cervical lymphadenopathy appriciated.  Symmetrical Chest wall movement, Good air movement bilaterally, CTAB RRR,No Gallops,Rubs or new Murmurs, No Parasternal Heave +ve B.Sounds, Abd Soft, Non tender, No organomegaly appriciated, No rebound -guarding or rigidity. No Cyanosis, Clubbing or edema, No new Rash or bruise   Data Review   CBC w Diff:  Lab Results  Component Value Date   WBC 9.2 01/18/2021   HGB 11.5 (L) 01/18/2021   HGB 15.3 02/20/2020   HCT 35.3 (L) 01/18/2021   HCT 46.4 02/20/2020   PLT 202 01/18/2021   PLT 219 02/20/2020   LYMPHOPCT 16 01/18/2021   MONOPCT 10 01/18/2021   EOSPCT 2 01/18/2021   BASOPCT 0 01/18/2021    CMP:  Lab Results  Component Value Date   NA 136 01/18/2021   NA 140 02/20/2020   K 3.7 01/18/2021   CL 105 01/18/2021   CO2 22 01/18/2021   BUN 17 01/18/2021   BUN 13 02/20/2020   CREATININE 1.34 (H) 01/18/2021   PROT 5.5 (L) 01/18/2021   PROT 6.5 02/20/2020   ALBUMIN 2.4 (L) 01/18/2021   ALBUMIN 4.2 02/20/2020   BILITOT 0.3 01/18/2021   BILITOT 0.4 02/20/2020   ALKPHOS 76 01/18/2021   AST 25  01/18/2021   ALT 21 01/18/2021  .   Total Time in preparing paper work, data evaluation and todays exam - 43 minutes  Lala Lund M.D on 01/18/2021 at 10:28 AM  [facility]

## 2021-01-21 DIAGNOSIS — Z87440 Personal history of urinary (tract) infections: Secondary | ICD-10-CM | POA: Diagnosis not present

## 2021-01-21 DIAGNOSIS — N400 Enlarged prostate without lower urinary tract symptoms: Secondary | ICD-10-CM | POA: Diagnosis not present

## 2021-01-21 LAB — CULTURE, BLOOD (ROUTINE X 2)
Culture: NO GROWTH
Culture: NO GROWTH
Special Requests: ADEQUATE
Special Requests: ADEQUATE

## 2021-01-25 DIAGNOSIS — E039 Hypothyroidism, unspecified: Secondary | ICD-10-CM | POA: Diagnosis not present

## 2021-01-25 DIAGNOSIS — J309 Allergic rhinitis, unspecified: Secondary | ICD-10-CM | POA: Diagnosis not present

## 2021-01-25 DIAGNOSIS — E785 Hyperlipidemia, unspecified: Secondary | ICD-10-CM | POA: Diagnosis not present

## 2021-01-25 DIAGNOSIS — I10 Essential (primary) hypertension: Secondary | ICD-10-CM | POA: Diagnosis not present

## 2021-01-25 DIAGNOSIS — E1129 Type 2 diabetes mellitus with other diabetic kidney complication: Secondary | ICD-10-CM | POA: Diagnosis not present

## 2021-01-25 DIAGNOSIS — I5032 Chronic diastolic (congestive) heart failure: Secondary | ICD-10-CM | POA: Diagnosis not present

## 2021-01-25 DIAGNOSIS — M109 Gout, unspecified: Secondary | ICD-10-CM | POA: Diagnosis not present

## 2021-01-25 DIAGNOSIS — N2889 Other specified disorders of kidney and ureter: Secondary | ICD-10-CM | POA: Diagnosis not present

## 2021-02-09 DIAGNOSIS — N289 Disorder of kidney and ureter, unspecified: Secondary | ICD-10-CM | POA: Diagnosis not present

## 2021-02-10 DIAGNOSIS — E785 Hyperlipidemia, unspecified: Secondary | ICD-10-CM | POA: Diagnosis not present

## 2021-02-22 DIAGNOSIS — R3915 Urgency of urination: Secondary | ICD-10-CM | POA: Diagnosis not present

## 2021-02-22 DIAGNOSIS — N183 Chronic kidney disease, stage 3 unspecified: Secondary | ICD-10-CM | POA: Diagnosis not present

## 2021-02-22 DIAGNOSIS — N3000 Acute cystitis without hematuria: Secondary | ICD-10-CM | POA: Diagnosis not present

## 2021-02-22 DIAGNOSIS — D4101 Neoplasm of uncertain behavior of right kidney: Secondary | ICD-10-CM | POA: Diagnosis not present

## 2021-03-28 DIAGNOSIS — E559 Vitamin D deficiency, unspecified: Secondary | ICD-10-CM | POA: Diagnosis not present

## 2021-03-28 DIAGNOSIS — E1129 Type 2 diabetes mellitus with other diabetic kidney complication: Secondary | ICD-10-CM | POA: Diagnosis not present

## 2021-03-28 DIAGNOSIS — E291 Testicular hypofunction: Secondary | ICD-10-CM | POA: Diagnosis not present

## 2021-03-28 DIAGNOSIS — E785 Hyperlipidemia, unspecified: Secondary | ICD-10-CM | POA: Diagnosis not present

## 2021-03-28 DIAGNOSIS — Z125 Encounter for screening for malignant neoplasm of prostate: Secondary | ICD-10-CM | POA: Diagnosis not present

## 2021-03-28 DIAGNOSIS — M109 Gout, unspecified: Secondary | ICD-10-CM | POA: Diagnosis not present

## 2021-03-28 DIAGNOSIS — I10 Essential (primary) hypertension: Secondary | ICD-10-CM | POA: Diagnosis not present

## 2021-04-04 DIAGNOSIS — Z1339 Encounter for screening examination for other mental health and behavioral disorders: Secondary | ICD-10-CM | POA: Diagnosis not present

## 2021-04-04 DIAGNOSIS — I5032 Chronic diastolic (congestive) heart failure: Secondary | ICD-10-CM | POA: Diagnosis not present

## 2021-04-04 DIAGNOSIS — E1129 Type 2 diabetes mellitus with other diabetic kidney complication: Secondary | ICD-10-CM | POA: Diagnosis not present

## 2021-04-04 DIAGNOSIS — R82998 Other abnormal findings in urine: Secondary | ICD-10-CM | POA: Diagnosis not present

## 2021-04-04 DIAGNOSIS — E1169 Type 2 diabetes mellitus with other specified complication: Secondary | ICD-10-CM | POA: Diagnosis not present

## 2021-04-04 DIAGNOSIS — Z1331 Encounter for screening for depression: Secondary | ICD-10-CM | POA: Diagnosis not present

## 2021-04-04 DIAGNOSIS — M1990 Unspecified osteoarthritis, unspecified site: Secondary | ICD-10-CM | POA: Diagnosis not present

## 2021-04-04 DIAGNOSIS — E785 Hyperlipidemia, unspecified: Secondary | ICD-10-CM | POA: Diagnosis not present

## 2021-04-04 DIAGNOSIS — F4310 Post-traumatic stress disorder, unspecified: Secondary | ICD-10-CM | POA: Diagnosis not present

## 2021-04-04 DIAGNOSIS — I110 Hypertensive heart disease with heart failure: Secondary | ICD-10-CM | POA: Diagnosis not present

## 2021-04-04 DIAGNOSIS — E669 Obesity, unspecified: Secondary | ICD-10-CM | POA: Diagnosis not present

## 2021-04-04 DIAGNOSIS — Z23 Encounter for immunization: Secondary | ICD-10-CM | POA: Diagnosis not present

## 2021-04-04 DIAGNOSIS — Q791 Other congenital malformations of diaphragm: Secondary | ICD-10-CM | POA: Diagnosis not present

## 2021-05-13 DIAGNOSIS — I130 Hypertensive heart and chronic kidney disease with heart failure and stage 1 through stage 4 chronic kidney disease, or unspecified chronic kidney disease: Secondary | ICD-10-CM | POA: Diagnosis not present

## 2021-05-13 DIAGNOSIS — N183 Chronic kidney disease, stage 3 unspecified: Secondary | ICD-10-CM | POA: Diagnosis not present

## 2021-05-13 DIAGNOSIS — E1122 Type 2 diabetes mellitus with diabetic chronic kidney disease: Secondary | ICD-10-CM | POA: Diagnosis not present

## 2021-05-13 DIAGNOSIS — I5032 Chronic diastolic (congestive) heart failure: Secondary | ICD-10-CM | POA: Diagnosis not present

## 2021-08-19 ENCOUNTER — Ambulatory Visit: Payer: PPO | Admitting: Cardiovascular Disease

## 2021-09-05 DIAGNOSIS — M79674 Pain in right toe(s): Secondary | ICD-10-CM | POA: Diagnosis not present

## 2021-10-25 DIAGNOSIS — M109 Gout, unspecified: Secondary | ICD-10-CM | POA: Diagnosis not present

## 2021-10-25 DIAGNOSIS — E1169 Type 2 diabetes mellitus with other specified complication: Secondary | ICD-10-CM | POA: Diagnosis not present

## 2021-10-25 DIAGNOSIS — M1990 Unspecified osteoarthritis, unspecified site: Secondary | ICD-10-CM | POA: Diagnosis not present

## 2021-10-25 DIAGNOSIS — I10 Essential (primary) hypertension: Secondary | ICD-10-CM | POA: Diagnosis not present

## 2021-10-25 DIAGNOSIS — E1129 Type 2 diabetes mellitus with other diabetic kidney complication: Secondary | ICD-10-CM | POA: Diagnosis not present

## 2021-10-25 DIAGNOSIS — E785 Hyperlipidemia, unspecified: Secondary | ICD-10-CM | POA: Diagnosis not present

## 2021-10-25 DIAGNOSIS — J309 Allergic rhinitis, unspecified: Secondary | ICD-10-CM | POA: Diagnosis not present

## 2021-10-25 DIAGNOSIS — E039 Hypothyroidism, unspecified: Secondary | ICD-10-CM | POA: Diagnosis not present

## 2021-10-25 DIAGNOSIS — F4310 Post-traumatic stress disorder, unspecified: Secondary | ICD-10-CM | POA: Diagnosis not present

## 2021-12-03 ENCOUNTER — Encounter (HOSPITAL_BASED_OUTPATIENT_CLINIC_OR_DEPARTMENT_OTHER): Payer: Self-pay | Admitting: Emergency Medicine

## 2021-12-03 ENCOUNTER — Emergency Department (HOSPITAL_BASED_OUTPATIENT_CLINIC_OR_DEPARTMENT_OTHER): Payer: No Typology Code available for payment source | Admitting: Radiology

## 2021-12-03 ENCOUNTER — Other Ambulatory Visit: Payer: Self-pay

## 2021-12-03 ENCOUNTER — Emergency Department (HOSPITAL_BASED_OUTPATIENT_CLINIC_OR_DEPARTMENT_OTHER)
Admission: EM | Admit: 2021-12-03 | Discharge: 2021-12-03 | Disposition: A | Discharge status: Home / Self Care | Payer: No Typology Code available for payment source | Attending: Emergency Medicine | Admitting: Emergency Medicine

## 2021-12-03 ENCOUNTER — Emergency Department (HOSPITAL_BASED_OUTPATIENT_CLINIC_OR_DEPARTMENT_OTHER): Payer: No Typology Code available for payment source

## 2021-12-03 DIAGNOSIS — S0992XA Unspecified injury of nose, initial encounter: Secondary | ICD-10-CM | POA: Diagnosis present

## 2021-12-03 DIAGNOSIS — W19XXXA Unspecified fall, initial encounter: Secondary | ICD-10-CM

## 2021-12-03 DIAGNOSIS — Z79899 Other long term (current) drug therapy: Secondary | ICD-10-CM | POA: Insufficient documentation

## 2021-12-03 DIAGNOSIS — W01198A Fall on same level from slipping, tripping and stumbling with subsequent striking against other object, initial encounter: Secondary | ICD-10-CM | POA: Insufficient documentation

## 2021-12-03 DIAGNOSIS — S0031XA Abrasion of nose, initial encounter: Secondary | ICD-10-CM | POA: Insufficient documentation

## 2021-12-03 DIAGNOSIS — Z7982 Long term (current) use of aspirin: Secondary | ICD-10-CM | POA: Insufficient documentation

## 2021-12-03 DIAGNOSIS — Y92009 Unspecified place in unspecified non-institutional (private) residence as the place of occurrence of the external cause: Secondary | ICD-10-CM | POA: Diagnosis not present

## 2021-12-03 DIAGNOSIS — S0081XA Abrasion of other part of head, initial encounter: Secondary | ICD-10-CM | POA: Diagnosis not present

## 2021-12-03 DIAGNOSIS — Z7984 Long term (current) use of oral hypoglycemic drugs: Secondary | ICD-10-CM | POA: Diagnosis not present

## 2021-12-03 DIAGNOSIS — K029 Dental caries, unspecified: Secondary | ICD-10-CM | POA: Diagnosis not present

## 2021-12-03 DIAGNOSIS — M19072 Primary osteoarthritis, left ankle and foot: Secondary | ICD-10-CM | POA: Diagnosis not present

## 2021-12-03 DIAGNOSIS — S0990XA Unspecified injury of head, initial encounter: Secondary | ICD-10-CM | POA: Diagnosis not present

## 2021-12-03 DIAGNOSIS — S0993XA Unspecified injury of face, initial encounter: Secondary | ICD-10-CM | POA: Diagnosis not present

## 2021-12-03 MED ORDER — ACETAMINOPHEN 500 MG PO TABS
1000.0000 mg | ORAL_TABLET | Freq: Once | ORAL | Status: AC
Start: 2021-12-03 — End: 2021-12-03
  Administered 2021-12-03: 1000 mg via ORAL
  Filled 2021-12-03: qty 2

## 2021-12-03 NOTE — ED Triage Notes (Signed)
Pt fell while going into house last night and fell face first on hardwood floor. He hit his head/nose. No neuro changes or LOC. Pt's forehead is red/swollen, nose is scratched, states he had lots of nose bleeding. Only on 81 mg asp a day.

## 2021-12-03 NOTE — ED Notes (Signed)
Lac on bridge of nose. Wound cleansed with saline, bacitracin and bandage applied.

## 2021-12-03 NOTE — Discharge Instructions (Addendum)
The CT scan of your head was without any fractures.  Your x-ray of your foot is without any fractures.  I recommend Tylenol 1000 mg every 6 hours for pain.  Continue take your home medications, follow-up with your primary care doctor.  Return to the ER for any new or concerning symptoms.  You were also noted to have a cavity on your CT scan.  I have printed this read for you and you can follow-up with your dentist regarding this.

## 2021-12-03 NOTE — ED Provider Notes (Signed)
[place] EMERGENCY DEPT Provider Note   CSN: [id number] Arrival date & time: 12/03/21  1226     History  Chief Complaint  Patient presents with   [name]    Terry Lyons is a 76 y.o. male.   Fall  Patient is a 76 year old male who is not on any anticoagulation presented emergency room today after mechanical fall after he caught his left toes on the threshold into his house through a sliding glass door.  He states that he tripped forward and smacked his face against the ground.  Did not lose consciousness has not had any nausea vomiting lightheadedness dizziness chest pain abdominal pain or neck pain.  He denies any other associate symptoms.  He states he talked to his daughter who is an Therapist, sports and she recommended he come to the ER.  He states he feels generally well does have a scratch on his nose.  Has had some nosebleeding which has stopped after some pressure.  He does take a daily aspirin.     Home Medications Prior to Admission medications   Medication Sig Start Date End Date Taking? Authorizing Provider  Acetaminophen 500 MG coapsule Take 1 capsule by mouth every 6 (six) hours as needed for fever.    [provider]  allopurinol (ZYLOPRIM) 300 MG tablet Take 300 mg by mouth daily.    [provider]  aspirin EC 81 MG tablet Take 81 mg by mouth daily.    [provider]  carvedilol (COREG) 25 MG tablet Take 1 tablet (25 mg total) by mouth 2 (two) times daily with a meal. 01/26/20   Dorothy Spark, MD  cetirizine (ZYRTEC) 10 MG tablet Take 10 mg by mouth daily.    [provider]  Cholecalciferol (VITAMIN D PO) Take 5,000 Units by mouth every other day.    [provider]  Coenzyme Q10 (CO Q 10 PO) Take 1 capsule by mouth daily.     [provider]  cyclobenzaprine (FLEXERIL) 10 MG tablet Take 1 tablet (10 mg total) by mouth 3 (three) times daily as needed for muscle spasms. 10/05/17   Shuford, Olivia Mackie, PA-C   doxazosin (CARDURA) 8 MG tablet Take 4 mg by mouth daily.    [provider]  famotidine (PEPCID) 40 MG tablet TAKE ONE TABLET BY MOUTH AT BEDTIME -  THIS IS FOR REFLUX UP TO THE THROAT LEVEL. 12/20/20 12/21/21  [provider]  fenofibrate 160 MG tablet Take 1 tablet (160 mg total) by mouth daily. 02/23/20   Dorothy Spark, MD  furosemide (LASIX) 40 MG tablet Take 1 tablet (40 mg total) by mouth 2 (two) times daily. Patient taking differently: Take 40 mg by mouth daily. 06/30/16   Dorothy Spark, MD  gabapentin (NEURONTIN) 300 MG capsule Take 1 capsule by mouth 2 (two) times daily. 12/17/20 10/14/21  [provider]  levothyroxine (SYNTHROID, LEVOTHROID) 50 MCG tablet Take 50 mcg by mouth daily before breakfast.    [provider]  Menthol-Methyl Salicylate (THERA-GESIC EX) Apply 1 application topically 4 (four) times daily as needed (knee pain.).     [provider]  metFORMIN (GLUCOPHAGE) 500 MG tablet Take 500 mg by mouth 2 (two) times daily with a meal.    [provider]  mirtazapine (REMERON) 30 MG tablet Take 30 mg by mouth at bedtime.    [provider]  Multiple Vitamin (MULTIVITAMIN WITH MINERALS) TABS tablet Take 1 tablet by mouth daily. PACKET  [provider]  Omega-3 Fatty Acids (FISH OIL) 1000 MG CAPS Take 1 capsule by mouth daily.    [provider]  omeprazole (PRILOSEC) 20 MG capsule Take 40 mg by mouth 2 (two) times daily.     [provider]  potassium chloride (K-DUR) 10 MEQ tablet Take 10 mEq by mouth daily.    [provider]  pravastatin (PRAVACHOL) 20 MG tablet Take 1 tablet (20 mg total) by mouth every evening. 09/04/19 01/16/21  Dorothy Spark, MD  QUEtiapine (SEROQUEL XR) 50 MG TB24 24 hr tablet Take 150 mg by mouth at bedtime.     [provider]  Saw Palmetto, Serenoa repens, (SAW PALMETTO PO) Take 1 capsule by mouth daily.    [provider]   tamsulosin (FLOMAX) 0.4 MG CAPS capsule Take 1 capsule (0.4 mg total) by mouth daily after supper. 01/18/21   Thurnell Lose, MD      Allergies    Sulfa antibiotics, Bupropion hcl, Codeine, Escitalopram oxalate, and Sertraline hcl    Review of Systems   Review of Systems  Physical Exam Updated Vital Signs BP (!) 140/75   Pulse 71   Temp 98.5 F (36.9 C)   Resp 16   SpO2 97%  Physical Exam Vitals and nursing note reviewed.  Constitutional:      General: He is not in acute distress. HENT:     Head: Normocephalic.     Comments: Evidence of facial trauma, there is some scrapes to the face and some tenderness of bilateral inferior orbital bones.  EOMI.    Nose: Nose normal.  Eyes:     General: No scleral icterus. Cardiovascular:     Rate and Rhythm: Normal rate and regular rhythm.     Pulses: Normal pulses.     Heart sounds: Normal heart sounds.  Pulmonary:     Effort: Pulmonary effort is normal. No respiratory distress.     Breath sounds: No wheezing.  Abdominal:     Palpations: Abdomen is soft.     Tenderness: There is no abdominal tenderness.  Musculoskeletal:     Cervical back: Normal range of motion.     Right lower leg: No edema.     Left lower leg: No edema.     Comments: No bony tenderness over joints or long bones of the upper and lower extremities.    No neck or back midline tenderness, step-off, deformity, or bruising. Able to turn head left and right 45 degrees without difficulty.  Full range of motion of upper and lower extremity joints shown after palpation was conducted; with 5/5 symmetrical strength in upper and lower extremities. No chest wall tenderness, no facial or cranial tenderness.   Patient has intact sensation grossly in lower and upper extremities. Intact patellar and ankle reflexes. Patient able to ambulate without difficulty.  Radial and DP pulses palpated BL.    There is some diffuse nonfocal bony tenderness of all the toes of the left  foot.  Full range of motion cap refill less than 2 seconds  Skin:    General: Skin is warm and dry.     Capillary Refill: Capillary refill takes less than 2 seconds.     Comments: Abrasion over the bridge of the nose  Neurological:     Mental Status: He is alert. Mental status is at baseline.  Psychiatric:        Mood and Affect: Mood normal.        Behavior: Behavior  normal.     ED Results / Procedures / Treatments   Labs (all labs ordered are listed, but only abnormal results are displayed) Labs Reviewed - No data to display  EKG None  Radiology CT Head Wo Contrast  Result Date: 12/03/2021 CLINICAL DATA:  Facial trauma, blunt TTP of bridge of nose; Head trauma, minor (Age >= 65y) EXAM: CT HEAD WITHOUT CONTRAST CT MAXILLOFACIAL WITHOUT CONTRAST TECHNIQUE: Multidetector CT imaging of the head and maxillofacial structures were performed using the standard protocol without intravenous contrast. Multiplanar CT image reconstructions of the maxillofacial structures were also generated. RADIATION DOSE REDUCTION: This exam was performed according to the departmental dose-optimization program which includes automated exposure control, adjustment of the mA and/or kV according to patient size and/or use of iterative reconstruction technique. COMPARISON:  None Available. FINDINGS: CT HEAD FINDINGS BRAIN: BRAIN Cerebral ventricle sizes are concordant with the degree of cerebral volume loss. Patchy and confluent areas of decreased attenuation are noted throughout the deep and periventricular white matter of the cerebral hemispheres bilaterally, compatible with chronic microvascular ischemic disease. No evidence of large-territorial acute infarction. No parenchymal hemorrhage. No mass lesion. No extra-axial collection. No mass effect or midline shift. No hydrocephalus. Basilar cisterns are patent. Vascular: No hyperdense vessel. Atherosclerotic calcifications are present within the cavernous internal  carotid or vertebral arteries. Skull: No acute fracture or focal lesion. Other: None. CT MAXILLOFACIAL FINDINGS Osseous: No fracture or mandibular dislocation. No destructive process. Left maxillary molar erosion suggestive of carry (6:65). Sinuses/Orbits: Ethmoid mucosal thickening. Otherwise paranasal sinuses and mastoid air cells are clear. The orbits are unremarkable. Soft tissues: Negative. IMPRESSION: 1. No acute intracranial abnormality. 2. No acute displaced facial fracture. 3. Left maxillary molar erosion suggestive of carry. Electronically Signed   By: Iven Finn M.D.   On: 12/03/2021 17:11   CT Maxillofacial Wo Contrast  Result Date: 12/03/2021 CLINICAL DATA:  Facial trauma, blunt TTP of bridge of nose; Head trauma, minor (Age >= 65y) EXAM: CT HEAD WITHOUT CONTRAST CT MAXILLOFACIAL WITHOUT CONTRAST TECHNIQUE: Multidetector CT imaging of the head and maxillofacial structures were performed using the standard protocol without intravenous contrast. Multiplanar CT image reconstructions of the maxillofacial structures were also generated. RADIATION DOSE REDUCTION: This exam was performed according to the departmental dose-optimization program which includes automated exposure control, adjustment of the mA and/or kV according to patient size and/or use of iterative reconstruction technique. COMPARISON:  None Available. FINDINGS: CT HEAD FINDINGS BRAIN: BRAIN Cerebral ventricle sizes are concordant with the degree of cerebral volume loss. Patchy and confluent areas of decreased attenuation are noted throughout the deep and periventricular white matter of the cerebral hemispheres bilaterally, compatible with chronic microvascular ischemic disease. No evidence of large-territorial acute infarction. No parenchymal hemorrhage. No mass lesion. No extra-axial collection. No mass effect or midline shift. No hydrocephalus. Basilar cisterns are patent. Vascular: No hyperdense vessel. Atherosclerotic  calcifications are present within the cavernous internal carotid or vertebral arteries. Skull: No acute fracture or focal lesion. Other: None. CT MAXILLOFACIAL FINDINGS Osseous: No fracture or mandibular dislocation. No destructive process. Left maxillary molar erosion suggestive of carry (6:65). Sinuses/Orbits: Ethmoid mucosal thickening. Otherwise paranasal sinuses and mastoid air cells are clear. The orbits are unremarkable. Soft tissues: Negative. IMPRESSION: 1. No acute intracranial abnormality. 2. No acute displaced facial fracture. 3. Left maxillary molar erosion suggestive of carry. Electronically Signed   By: Iven Finn M.D.   On: 12/03/2021 17:11   DG Foot Complete Left  Result Date: 12/03/2021 CLINICAL DATA:  L great toe TTP at distal phal. All other toes uncomfortable but NTTP EXAM: LEFT FOOT - COMPLETE 3+ VIEW COMPARISON:  None available FINDINGS: No fracture or dislocation. No focal cortical destruction. Mild osteopenia. Dorsal spurring in the midfoot. Small calcaneal spurs. No radiodense foreign body. Vascular calcifications in the distal calf. Regional soft tissues otherwise unremarkable. IMPRESSION: No acute findings. Degenerative changes as above. Electronically Signed   By: Lucrezia Europe M.D.   On: 12/03/2021 16:58    Procedures Procedures    Medications Ordered in ED Medications  acetaminophen (TYLENOL) tablet 1,000 mg (1,000 mg Oral Given 12/03/21 1652)    ED Course/ Medical Decision Making/ A&P Clinical Course as of 12/03/21 2246  Sat Dec 03, 2021  1629 Fall 3pm yesterday [WF]    Clinical Course User Index [WF] Tedd Sias, [place]                           Medical Decision Making Amount and/or Complexity of Data Reviewed Radiology: ordered.  Risk OTC drugs.   Patient is here after mechanical fall.  Not on anticoagulation.  Physical exam reassuring.  CT head and max face without significant abnormal finding apart from dental caries which he is aware  of.  Overall well-appearing.  Vital signs within normal limits apart from mild hypertension.  Return precautions discussed.  Patient is ambulatory at time of discharge.  I personally viewed x-ray of CT head and max face, plain x-ray of left foot without fractures.  Distally neurovascular intact.  Overall reassuring exam.  Will discharge home with follow-up with primary care.  Final Clinical Impression(s) / ED Diagnoses Final diagnoses:  Fall, initial encounter  Dental cavities    Rx / DC Orders ED Discharge Orders     None         Tedd Sias, [place] 12/03/21 2248    Tegeler, Gwenyth Allegra, MD 12/04/21 0006

## 2021-12-03 NOTE — ED Triage Notes (Signed)
Left toes also hurting/.

## 2022-03-29 DIAGNOSIS — E1129 Type 2 diabetes mellitus with other diabetic kidney complication: Secondary | ICD-10-CM | POA: Diagnosis not present

## 2022-04-14 DIAGNOSIS — R7989 Other specified abnormal findings of blood chemistry: Secondary | ICD-10-CM | POA: Diagnosis not present

## 2022-04-14 DIAGNOSIS — N1832 Chronic kidney disease, stage 3b: Secondary | ICD-10-CM | POA: Diagnosis not present

## 2022-04-14 DIAGNOSIS — E039 Hypothyroidism, unspecified: Secondary | ICD-10-CM | POA: Diagnosis not present

## 2022-04-14 DIAGNOSIS — E291 Testicular hypofunction: Secondary | ICD-10-CM | POA: Diagnosis not present

## 2022-04-14 DIAGNOSIS — E1129 Type 2 diabetes mellitus with other diabetic kidney complication: Secondary | ICD-10-CM | POA: Diagnosis not present

## 2022-04-14 DIAGNOSIS — Z125 Encounter for screening for malignant neoplasm of prostate: Secondary | ICD-10-CM | POA: Diagnosis not present

## 2022-04-14 DIAGNOSIS — Z1212 Encounter for screening for malignant neoplasm of rectum: Secondary | ICD-10-CM | POA: Diagnosis not present

## 2022-04-14 DIAGNOSIS — I10 Essential (primary) hypertension: Secondary | ICD-10-CM | POA: Diagnosis not present

## 2022-04-14 DIAGNOSIS — E559 Vitamin D deficiency, unspecified: Secondary | ICD-10-CM | POA: Diagnosis not present

## 2022-04-14 DIAGNOSIS — E785 Hyperlipidemia, unspecified: Secondary | ICD-10-CM | POA: Diagnosis not present

## 2022-04-14 DIAGNOSIS — M109 Gout, unspecified: Secondary | ICD-10-CM | POA: Diagnosis not present

## 2022-04-19 DIAGNOSIS — I5032 Chronic diastolic (congestive) heart failure: Secondary | ICD-10-CM | POA: Diagnosis not present

## 2022-04-19 DIAGNOSIS — E039 Hypothyroidism, unspecified: Secondary | ICD-10-CM | POA: Diagnosis not present

## 2022-04-19 DIAGNOSIS — I1310 Hypertensive heart and chronic kidney disease without heart failure, with stage 1 through stage 4 chronic kidney disease, or unspecified chronic kidney disease: Secondary | ICD-10-CM | POA: Diagnosis not present

## 2022-04-19 DIAGNOSIS — F4310 Post-traumatic stress disorder, unspecified: Secondary | ICD-10-CM | POA: Diagnosis not present

## 2022-04-19 DIAGNOSIS — N1832 Chronic kidney disease, stage 3b: Secondary | ICD-10-CM | POA: Diagnosis not present

## 2022-04-19 DIAGNOSIS — Z0000 Encounter for general adult medical examination without abnormal findings: Secondary | ICD-10-CM | POA: Diagnosis not present

## 2022-04-19 DIAGNOSIS — E785 Hyperlipidemia, unspecified: Secondary | ICD-10-CM | POA: Diagnosis not present

## 2022-04-19 DIAGNOSIS — E1129 Type 2 diabetes mellitus with other diabetic kidney complication: Secondary | ICD-10-CM | POA: Diagnosis not present

## 2022-10-03 ENCOUNTER — Encounter: Payer: Self-pay | Admitting: Internal Medicine

## 2022-10-05 DIAGNOSIS — M1712 Unilateral primary osteoarthritis, left knee: Secondary | ICD-10-CM | POA: Diagnosis not present

## 2022-10-12 DIAGNOSIS — M1712 Unilateral primary osteoarthritis, left knee: Secondary | ICD-10-CM | POA: Diagnosis not present

## 2022-10-19 DIAGNOSIS — M1712 Unilateral primary osteoarthritis, left knee: Secondary | ICD-10-CM | POA: Diagnosis not present

## 2022-12-20 ENCOUNTER — Ambulatory Visit (INDEPENDENT_AMBULATORY_CARE_PROVIDER_SITE_OTHER): Payer: No Typology Code available for payment source | Admitting: Internal Medicine

## 2022-12-20 ENCOUNTER — Encounter: Payer: Self-pay | Admitting: Internal Medicine

## 2022-12-20 VITALS — BP 140/78 | HR 72 | Ht 68.0 in | Wt 266.0 lb

## 2022-12-20 DIAGNOSIS — R1310 Dysphagia, unspecified: Secondary | ICD-10-CM

## 2022-12-20 DIAGNOSIS — K222 Esophageal obstruction: Secondary | ICD-10-CM

## 2022-12-20 DIAGNOSIS — K219 Gastro-esophageal reflux disease without esophagitis: Secondary | ICD-10-CM | POA: Diagnosis not present

## 2022-12-20 NOTE — Patient Instructions (Signed)
You have been scheduled for an endoscopy. Please follow written instructions given to you at your visit today.  If you use inhalers (even only as needed), please bring them with you on the day of your procedure.  If you take any of the following medications, they will need to be adjusted prior to your procedure:   DO NOT TAKE 7 DAYS PRIOR TO TEST- Trulicity (dulaglutide) Ozempic, Wegovy (semaglutide) Mounjaro (tirzepatide) Bydureon Bcise (exanatide extended release)  DO NOT TAKE 1 DAY PRIOR TO YOUR TEST Rybelsus (semaglutide) Adlyxin (lixisenatide) Victoza (liraglutide) Byetta (exanatide) ___________________________________________________________________________   _______________________________________________________  If your blood pressure at your visit was 140/90 or greater, please contact your primary care physician to follow up on this.  _______________________________________________________  If you are age 6 or older, your body mass index should be between 23-30. Your Body mass index is 40.45 kg/m. If this is out of the aforementioned range listed, please consider follow up with your Primary Care Provider.  If you are age 52 or younger, your body mass index should be between 19-25. Your Body mass index is 40.45 kg/m. If this is out of the aformentioned range listed, please consider follow up with your Primary Care Provider.   ________________________________________________________  The [facility] providers would like to encourage you to use [facility] to communicate with providers for non-urgent requests or questions.  Due to long hold times on the telephone, sending your provider a message by [facility] may be a faster and more efficient way to get a response.  Please allow 48 business hours for a response.  Please remember that this is for non-urgent requests.  _______________________________________________________

## 2022-12-20 NOTE — Progress Notes (Signed)
HISTORY OF PRESENT ILLNESS:  Terry Lyons is a 77 y.o. male with past medical history as listed below.  Terry Lyons is sent today by the [facility] regarding intermittent solid food dysphagia.  Terry Lyons has multiple medical problems including diabetes mellitus, history of congestive heart failure (most recent echo with normal EF), hypertension, hyperlipidemia, and morbid obesity.  Does have a history of GERD complicated by peptic stricture for which she is undergone a number of esophageal dilations.  For the most part this has been helpful.  However, Terry Lyons last dilation (54 [place] Maloney dilator), performed December 2020 was less effective, by Terry Lyons report.  Terry Lyons continues on omeprazole twice daily for GERD.  This controls symptoms.  Last colonoscopy was December 2020.  Outside records from [place] reviewed.  Blood work from August 2024 shows unremarkable comprehensive metabolic panel.  REVIEW OF SYSTEMS:  All non-GI ROS negative unless otherwise stated in the HPI except for sinus and allergy trouble, arthritis, back pain, fatigue, headaches, hearing problems, shortness of breath, knee pain  Past Medical History:  Diagnosis Date   Allergy    Arthritis    CHF (congestive heart failure) (HCC)    hospitalized in 2017; was diagnosed with an acute excacerbation.    Colon polyps    adenomatous   Diabetes (HCC)    Diabetes mellitus    GERD (gastroesophageal reflux disease)    Gout    History of basal cell carcinoma    [facility]   History of squamous cell carcinoma    Hyperlipidemia    Hypertension    Obesity    Rosacea    Tachycardia     Past Surgical History:  Procedure Laterality Date   colonoscopy with polypectomy     X3   ESOPHAGOGASTRODUODENOSCOPY N/A 03/10/2015   Procedure: ESOPHAGOGASTRODUODENOSCOPY (EGD);  Surgeon: Terry Dare, MD;  Location: [facility];  Service: Endoscopy;  Laterality: N/A;   esophagus dilated     X3   EUS N/A 05/20/2015   Procedure: UPPER ENDOSCOPIC ULTRASOUND (EUS)  RADIAL;  Surgeon: Terry Fee, MD;  Location: [facility];  Service: Endoscopy;  Laterality: N/A;   KNEE ARTHROSCOPY     bilaterally   PILONIDAL CYST EXCISION     REPLACEMENT TOTAL KNEE Right    R   SHOULDER SURGERY     right shoulder   TONSILLECTOMY     total ethmoidectomy & sphenoidectomiy Bilateral 05/27/13   also reduction of turbinates   TOTAL KNEE ARTHROPLASTY     RIGHT   TOTAL SHOULDER ARTHROPLASTY Left 10/04/2017   Procedure: LEFT TOTAL SHOULDER ARTHROPLASTY;  Surgeon: Terry Hanly, MD;  Location: [facility] OR;  Service: Orthopedics;  Laterality: Left;    Social History Terry Lyons  reports that Terry Lyons quit smoking about 49 years ago. Terry Lyons smoking use included cigarettes. Terry Lyons has never used smokeless tobacco. Terry Lyons reports that Terry Lyons does not drink alcohol and does not use drugs.  family history includes Heart attack (age of onset: 26) in Terry Lyons brother; Heart attack (age of onset: 77) in Terry Lyons paternal grandfather; Heart attack (age of onset: 78) in Terry Lyons paternal grandmother; Heart disease in Terry Lyons father; Lung cancer in Terry Lyons maternal aunt and mother.  Allergies  Allergen Reactions   Sulfa Antibiotics     UNSPECIFIED REACTION    Bupropion Hcl Other (See Comments)    REACTION: insomnia   Codeine Nausea Only   Escitalopram Oxalate Other (See Comments)    REACTION: insomnia   Sertraline Hcl Other (See Comments)  REACTION: insomnia       PHYSICAL EXAMINATION: Vital signs: BP (!) 140/78   Pulse 72   Ht 5\' 8"  (1.727 m)   Wt 266 lb (120.7 kg)   BMI 40.45 kg/m   Constitutional: generally well-appearing, no acute distress Psychiatric: alert and oriented x3, cooperative Eyes: extraocular movements intact, anicteric, conjunctiva pink Mouth: oral pharynx moist, no lesions Neck: supple no lymphadenopathy Cardiovascular: heart regular rate and rhythm, 2/6 systolic murmur Lungs: clear to auscultation bilaterally Abdomen: soft, obese, nontender, nondistended, no obvious ascites, no peritoneal  signs, normal bowel sounds, no organomegaly Rectal: Omitted Extremities: no clubbing or cyanosis.  1+ lower extremity edema bilaterally Skin: no lesions on visible extremities Neuro: No focal deficits.  Cranial nerves intact  ASSESSMENT:  1.  GERD with intermittent solid food dysphagia secondary to recurrent peptic stricture. 2.  History of multiple and advanced adenomatous colon polyps 3.  Multiple medical problems including diabetes and morbid obesity   PLAN:  1.  Reflux precautions 2.  Continue omeprazole 40 mg p.o. twice daily 3.  Schedule upper endoscopy with esophageal dilation.  Terry Lyons is higher than baseline risk due to Terry Lyons age, comorbidities, and body habitus.The nature of the procedure, as well as the risks, benefits, and alternatives were carefully and thoroughly reviewed with the Terry Lyons. Ample time for discussion and questions allowed. The Terry Lyons understood, was satisfied, and agreed to proceed. 4.  Terry Lyons has aged out of surveillance colonoscopy 5.  Ongoing general medical care with the Terry Lyons system and Terry Lyons PCP Dr. Waynard Lyons A total time of 45 minutes was spent preparing to see the Terry Lyons, reviewing outside records, obtaining comprehensive history, performing medically appropriate physical examination, counseling and educating the Terry Lyons regarding the above listed issues, ordering therapeutic endoscopic procedure, and documenting clinical information in the health record

## 2022-12-28 DIAGNOSIS — I130 Hypertensive heart and chronic kidney disease with heart failure and stage 1 through stage 4 chronic kidney disease, or unspecified chronic kidney disease: Secondary | ICD-10-CM | POA: Diagnosis not present

## 2022-12-28 DIAGNOSIS — K219 Gastro-esophageal reflux disease without esophagitis: Secondary | ICD-10-CM | POA: Diagnosis not present

## 2022-12-28 DIAGNOSIS — E1129 Type 2 diabetes mellitus with other diabetic kidney complication: Secondary | ICD-10-CM | POA: Diagnosis not present

## 2022-12-28 DIAGNOSIS — N1832 Chronic kidney disease, stage 3b: Secondary | ICD-10-CM | POA: Diagnosis not present

## 2022-12-28 DIAGNOSIS — I5032 Chronic diastolic (congestive) heart failure: Secondary | ICD-10-CM | POA: Diagnosis not present

## 2022-12-28 DIAGNOSIS — E785 Hyperlipidemia, unspecified: Secondary | ICD-10-CM | POA: Diagnosis not present

## 2022-12-28 DIAGNOSIS — F4310 Post-traumatic stress disorder, unspecified: Secondary | ICD-10-CM | POA: Diagnosis not present

## 2022-12-28 DIAGNOSIS — E039 Hypothyroidism, unspecified: Secondary | ICD-10-CM | POA: Diagnosis not present

## 2023-01-23 DIAGNOSIS — K5900 Constipation, unspecified: Secondary | ICD-10-CM | POA: Diagnosis not present

## 2023-01-23 DIAGNOSIS — R109 Unspecified abdominal pain: Secondary | ICD-10-CM | POA: Diagnosis not present

## 2023-01-24 ENCOUNTER — Ambulatory Visit (AMBULATORY_SURGERY_CENTER): Payer: No Typology Code available for payment source | Admitting: Internal Medicine

## 2023-01-24 ENCOUNTER — Encounter: Payer: Self-pay | Admitting: Internal Medicine

## 2023-01-24 VITALS — BP 138/66 | HR 67 | Temp 97.8°F | Resp 14 | Ht 68.0 in | Wt 266.0 lb

## 2023-01-24 DIAGNOSIS — R1310 Dysphagia, unspecified: Secondary | ICD-10-CM

## 2023-01-24 DIAGNOSIS — K449 Diaphragmatic hernia without obstruction or gangrene: Secondary | ICD-10-CM

## 2023-01-24 DIAGNOSIS — K219 Gastro-esophageal reflux disease without esophagitis: Secondary | ICD-10-CM

## 2023-01-24 DIAGNOSIS — K222 Esophageal obstruction: Secondary | ICD-10-CM | POA: Diagnosis present

## 2023-01-24 MED ORDER — SODIUM CHLORIDE 0.9 % IV SOLN: 500.0000 mL | Freq: Once | INTRAVENOUS | Status: DC

## 2023-01-24 NOTE — Patient Instructions (Signed)
    Continue present medications  FOLLOW POST  DILATATION DIET (GIVEN TO YOU TODAY)  CONTINUE TO THE CARE OF YOUR PRIMARY CARE PHYSICIAN   GI FOLLOW UP AS NEEDED     YOU HAD AN ENDOSCOPIC PROCEDURE TODAY AT THE [facility]:   Refer to the procedure report that was given to you for any specific questions about what was found during the examination.  If the procedure report does not answer your questions, please call your gastroenterologist to clarify.  If you requested that your care partner not be given the details of your procedure findings, then the procedure report has been included in a sealed envelope for you to review at your convenience later.  YOU SHOULD EXPECT: Some feelings of bloating in the abdomen. Passage of more gas than usual.  Walking can help get rid of the air that was put into your GI tract during the procedure and reduce the bloating. If you had a lower endoscopy (such as a colonoscopy or flexible sigmoidoscopy) you may notice spotting of blood in your stool or on the toilet paper. If you underwent a bowel prep for your procedure, you may not have a normal bowel movement for a few days.  Please Note:  You might notice some irritation and congestion in your nose or some drainage.  This is from the oxygen used during your procedure.  There is no need for concern and it should clear up in a day or so.  SYMPTOMS TO REPORT IMMEDIATELY:    Following upper endoscopy (EGD)  Vomiting of blood or coffee ground material  New chest pain or pain under the shoulder blades  Painful or persistently difficult swallowing  New shortness of breath  Fever of 100F or higher  Black, tarry-looking stools  For urgent or emergent issues, a gastroenterologist can be reached at any hour by calling [phone]. Do not use MyChart messaging for urgent concerns.    DIET:  We do recommend a small meal at first, but then you may proceed to your regular diet.  Drink plenty of  fluids but you should avoid alcoholic beverages for 24 hours.  ACTIVITY:  You should plan to take it easy for the rest of today and you should NOT DRIVE or use heavy machinery until tomorrow (because of the sedation medicines used during the test).    FOLLOW UP: Our staff will call the number listed on your records the next business day following your procedure.  We will call around 7:15- 8:00 am to check on you and address any questions or concerns that you may have regarding the information given to you following your procedure. If we do not reach you, we will leave a message.     If any biopsies were taken you will be contacted by phone or by letter within the next 1-3 weeks.  Please call us at [phone] if you have not heard about the biopsies in 3 weeks.    SIGNATURES/CONFIDENTIALITY: You and/or your care partner have signed paperwork which will be entered into your electronic medical record.  These signatures attest to the fact that that the information above on your After Visit Summary has been reviewed and is understood.  Full responsibility of the confidentiality of this discharge information lies with you and/or your care-partner.

## 2023-01-24 NOTE — Op Note (Signed)
[facility] Patient Name: Terry Lyons Procedure Date: 01/24/2023 2:54 PM MRN: [id number] Endoscopist: Wilhemina Bonito. Marina Goodell , MD, [id number] Age: 77 Referring MD:  Date of Birth: 02-Jan-1946 Gender: Male Account #: [id number] Procedure:                Upper GI endoscopy with balloon dilation of the                            esophagus. 20 mm max Indications:              Dysphagia, Esophageal reflux Medicines:                Monitored Anesthesia Care Procedure:                Pre-Anesthesia Assessment:                           - Prior to the procedure, a History and Physical                            was performed, and patient medications and                            allergies were reviewed. The patient's tolerance of                            previous anesthesia was also reviewed. The risks                            and benefits of the procedure and the sedation                            options and risks were discussed with the patient.                            All questions were answered, and informed consent                            was obtained. Prior Anticoagulants: The patient has                            taken no anticoagulant or antiplatelet agents.                            After reviewing the risks and benefits, the patient                            was deemed in satisfactory condition to undergo the                            procedure.                           After obtaining informed consent, the endoscope was  passed under direct vision. Throughout the                            procedure, the patient's blood pressure, pulse, and                            oxygen saturations were monitored continuously. The                            GIF [id number] #[id number] was introduced through the                            mouth, and advanced to the second part of duodenum.                            The upper GI endoscopy was accomplished without                             difficulty. The patient tolerated the procedure                            well. Scope In: Scope Out: Findings:                 One benign-appearing, intrinsic moderate ringlike                            stenosis was found at the gastroesophageal                            junction. This stenosis measured 1.5 cm (inner                            diameter). A TTS dilator was passed through the                            scope. Dilation with an 18-19-20 mm balloon dilator                            was performed to 20 mm. Mild disruption of the                            esophageal ring                           The exam of the esophagus was otherwise normal.                           The stomach was normal. Small hiatal hernia                           The examined duodenum was normal.                           The cardia and gastric fundus were normal on  retroflexion. Complications:            No immediate complications. Estimated Blood Loss:     Estimated blood loss: none. Impression:               - Benign-appearing esophageal stenosis. Dilated.                           - Normal stomach. Small hiatal hernia                           - Normal examined duodenum.                           - No specimens collected. Recommendation:           - Patient has a contact number available for                            emergencies. The signs and symptoms of potential                            delayed complications were discussed with the                            patient. Return to normal activities tomorrow.                            Written discharge instructions were provided to the                            patient.                           -Post dilation diet.                           - Continue present medications.                           -Continue to the care of your PCP. GI follow-up as                             needed Wilhemina Bonito. Marina Goodell, MD 01/24/2023 3:21:25 PM This report has been signed electronically.

## 2023-01-24 NOTE — Progress Notes (Signed)
Vss nad trans to pacu 

## 2023-01-24 NOTE — Progress Notes (Signed)
Pt's states no medical or surgical changes since previsit or office visit. 

## 2023-01-24 NOTE — Progress Notes (Signed)
Called to room to assist during endoscopic procedure.  Patient ID and intended procedure confirmed with present staff. Received instructions for my participation in the procedure from the performing physician.  

## 2023-01-24 NOTE — Progress Notes (Signed)
Expand All Collapse All HISTORY OF PRESENT ILLNESS:   Terry Lyons is a 77 y.o. male with past medical history as listed below.  He is sent today by the [facility] regarding intermittent solid food dysphagia.   Patient has multiple medical problems including diabetes mellitus, history of congestive heart failure (most recent echo with normal EF), hypertension, hyperlipidemia, and morbid obesity.  Does have a history of GERD complicated by peptic stricture for which she is undergone a number of esophageal dilations.  For the most part this has been helpful.  However, his last dilation (54 [place] Maloney dilator), performed December 2020 was less effective, by his report.  He continues on omeprazole twice daily for GERD.  This controls symptoms.   Last colonoscopy was December 2020.   Outside records from [place] reviewed.  Blood work from August 2024 shows unremarkable comprehensive metabolic panel.   REVIEW OF SYSTEMS:   All non-GI ROS negative unless otherwise stated in the HPI except for sinus and allergy trouble, arthritis, back pain, fatigue, headaches, hearing problems, shortness of breath, knee pain       Past Medical History:  Diagnosis Date   Allergy     Arthritis     CHF (congestive heart failure) (HCC)      hospitalized in 2017; was diagnosed with an acute excacerbation.    Colon polyps      adenomatous   Diabetes (HCC)     Diabetes mellitus     GERD (gastroesophageal reflux disease)     Gout     History of basal cell carcinoma      W-S Derm   History of squamous cell carcinoma     Hyperlipidemia     Hypertension     Obesity     Rosacea     Tachycardia                 Past Surgical History:  Procedure Laterality Date   colonoscopy with polypectomy        X3   ESOPHAGOGASTRODUODENOSCOPY N/A 03/10/2015    Procedure: ESOPHAGOGASTRODUODENOSCOPY (EGD);  Surgeon: Meryl Dare, MD;  Location: [facility];  Service: Endoscopy;  Laterality: N/A;   esophagus dilated         X3   EUS N/A 05/20/2015    Procedure: UPPER ENDOSCOPIC ULTRASOUND (EUS) RADIAL;  Surgeon: Rachael Fee, MD;  Location: [facility];  Service: Endoscopy;  Laterality: N/A;   KNEE ARTHROSCOPY        bilaterally   PILONIDAL CYST EXCISION       REPLACEMENT TOTAL KNEE Right      R   SHOULDER SURGERY        right shoulder   TONSILLECTOMY       total ethmoidectomy & sphenoidectomiy Bilateral 05/27/13    also reduction of turbinates   TOTAL KNEE ARTHROPLASTY        RIGHT   TOTAL SHOULDER ARTHROPLASTY Left 10/04/2017    Procedure: LEFT TOTAL SHOULDER ARTHROPLASTY;  Surgeon: Francena Hanly, MD;  Location: [facility];  Service: Orthopedics;  Laterality: Left;          Social History DARAY CRUMPLEY  reports that he quit smoking about 49 years ago. His smoking use included cigarettes. He has never used smokeless tobacco. He reports that he does not drink alcohol and does not use drugs.   family history includes Heart attack (age of onset: 59) in his brother; Heart attack (age of onset: 58) in his paternal grandfather;  Heart attack (age of onset: 45) in his paternal grandmother; Heart disease in his father; Lung cancer in his maternal aunt and mother.   Allergies       Allergies  Allergen Reactions   Sulfa Antibiotics        UNSPECIFIED REACTION    Bupropion Hcl Other (See Comments)      REACTION: insomnia   Codeine Nausea Only   Escitalopram Oxalate Other (See Comments)      REACTION: insomnia   Sertraline Hcl Other (See Comments)      REACTION: insomnia            PHYSICAL EXAMINATION: Vital signs: BP (!) 140/78   Pulse 72   Ht 5\' 8"  (1.727 m)   Wt 266 lb (120.7 kg)   BMI 40.45 kg/m   Constitutional: generally well-appearing, no acute distress Psychiatric: alert and oriented x3, cooperative Eyes: extraocular movements intact, anicteric, conjunctiva pink Mouth: oral pharynx moist, no lesions Neck: supple no lymphadenopathy Cardiovascular: heart regular rate and rhythm, 2/6 systolic  murmur Lungs: clear to auscultation bilaterally Abdomen: soft, obese, nontender, nondistended, no obvious ascites, no peritoneal signs, normal bowel sounds, no organomegaly Rectal: Omitted Extremities: no clubbing or cyanosis.  1+ lower extremity edema bilaterally Skin: no lesions on visible extremities Neuro: No focal deficits.  Cranial nerves intact   ASSESSMENT:   1.  GERD with intermittent solid food dysphagia secondary to recurrent peptic stricture. 2.  History of multiple and advanced adenomatous colon polyps 3.  Multiple medical problems including diabetes and morbid obesity     PLAN:   1.  Reflux precautions 2.  Continue omeprazole 40 mg p.o. twice daily 3.  Schedule upper endoscopy with esophageal dilation.  Patient is higher than baseline risk due to his age, comorbidities, and body habitus.The nature of the procedure, as well as the risks, benefits, and alternatives were carefully and thoroughly reviewed with the patient. Ample time for discussion and questions allowed. The patient understood, was satisfied, and agreed to proceed. 4.  Patient has aged out of surveillance colonoscopy 5.  Ongoing general medical care with the [facility] system and his PCP Dr. Waynard Edwards

## 2023-01-25 ENCOUNTER — Telehealth: Payer: Self-pay | Admitting: *Deleted

## 2023-01-25 NOTE — Telephone Encounter (Signed)
  Follow up Call-     01/24/2023    2:03 PM  Call back number  Post procedure Call Back phone  # [phone]  Permission to leave phone message Yes     Patient questions:  Do you have a fever, pain , or abdominal swelling? No. Pain Score  0 *  Have you tolerated food without any problems? Yes.    Have you been able to return to your normal activities? Yes.    Do you have any questions about your discharge instructions: Diet   No. Medications  No. Follow up visit  No.  Do you have questions or concerns about your Care? No.  Actions: * If pain score is 4 or above: No action needed, pain <4.

## 2023-01-29 ENCOUNTER — Encounter: Payer: Self-pay | Admitting: Internal Medicine

## 2023-03-08 ENCOUNTER — Telehealth: Payer: Self-pay | Admitting: Internal Medicine

## 2023-03-08 NOTE — Telephone Encounter (Signed)
[facility] needed [facility] for [facility] stating that he has GERD. Mailed pt his last office note that states his diagnosis of gerd. Pt will let us know if he needs anything further.

## 2023-03-08 NOTE — Telephone Encounter (Signed)
Inbound call from patient, states he would like a letter stating that he has a history of GERD, he states he is wanting to get an EGD through the [place] but they need a letter from Dr. Marina Goodell with that information. Patient would like to speak to a nurse to further advise on when this can get completed.

## 2023-03-12 NOTE — Telephone Encounter (Signed)
Patient called and stated that he has an [facility] soon, and he needs a form from our office stating that he had GERD. Patient is requesting a call back today. Please advise.

## 2023-03-13 NOTE — Telephone Encounter (Signed)
Pt states he had not gotten the OV notes yet and he has an appt on Friday and would like to have them for the visit. Let him know we can print out the notes and he can pick them up from the office. They will be at the desk on the 2nd floor to pick up, he is aware.

## 2023-03-13 NOTE — Telephone Encounter (Signed)
Left message for pt to call back

## 2023-03-13 NOTE — Telephone Encounter (Signed)
Patient returned call, please advise.

## 2023-08-11 ENCOUNTER — Encounter (HOSPITAL_COMMUNITY): Payer: Self-pay

## 2023-08-11 ENCOUNTER — Encounter (HOSPITAL_COMMUNITY): Payer: Self-pay | Admitting: Family Medicine

## 2023-08-11 ENCOUNTER — Observation Stay (HOSPITAL_COMMUNITY)
Admission: EM | Admit: 2023-08-11 | Discharge: 2023-08-13 | Disposition: A | Discharge status: Home / Self Care | Source: Other Acute Inpatient Hospital | Attending: Family Medicine | Admitting: Family Medicine

## 2023-08-11 DIAGNOSIS — E669 Obesity, unspecified: Secondary | ICD-10-CM | POA: Diagnosis not present

## 2023-08-11 DIAGNOSIS — E1122 Type 2 diabetes mellitus with diabetic chronic kidney disease: Secondary | ICD-10-CM | POA: Diagnosis not present

## 2023-08-11 DIAGNOSIS — I130 Hypertensive heart and chronic kidney disease with heart failure and stage 1 through stage 4 chronic kidney disease, or unspecified chronic kidney disease: Secondary | ICD-10-CM | POA: Diagnosis not present

## 2023-08-11 DIAGNOSIS — E039 Hypothyroidism, unspecified: Secondary | ICD-10-CM

## 2023-08-11 DIAGNOSIS — R42 Dizziness and giddiness: Principal | ICD-10-CM | POA: Insufficient documentation

## 2023-08-11 DIAGNOSIS — Z7984 Long term (current) use of oral hypoglycemic drugs: Secondary | ICD-10-CM | POA: Insufficient documentation

## 2023-08-11 DIAGNOSIS — I509 Heart failure, unspecified: Secondary | ICD-10-CM | POA: Insufficient documentation

## 2023-08-11 DIAGNOSIS — Z7982 Long term (current) use of aspirin: Secondary | ICD-10-CM | POA: Diagnosis not present

## 2023-08-11 DIAGNOSIS — Z96612 Presence of left artificial shoulder joint: Secondary | ICD-10-CM | POA: Diagnosis not present

## 2023-08-11 DIAGNOSIS — N1831 Chronic kidney disease, stage 3a: Secondary | ICD-10-CM | POA: Diagnosis not present

## 2023-08-11 DIAGNOSIS — Z85828 Personal history of other malignant neoplasm of skin: Secondary | ICD-10-CM | POA: Insufficient documentation

## 2023-08-11 DIAGNOSIS — I6340 Cerebral infarction due to embolism of unspecified cerebral artery: Secondary | ICD-10-CM | POA: Diagnosis not present

## 2023-08-11 DIAGNOSIS — Z87891 Personal history of nicotine dependence: Secondary | ICD-10-CM | POA: Diagnosis not present

## 2023-08-11 DIAGNOSIS — Z79899 Other long term (current) drug therapy: Secondary | ICD-10-CM | POA: Insufficient documentation

## 2023-08-11 DIAGNOSIS — I639 Cerebral infarction, unspecified: Secondary | ICD-10-CM | POA: Diagnosis not present

## 2023-08-11 DIAGNOSIS — Z96651 Presence of right artificial knee joint: Secondary | ICD-10-CM | POA: Insufficient documentation

## 2023-08-11 DIAGNOSIS — I129 Hypertensive chronic kidney disease with stage 1 through stage 4 chronic kidney disease, or unspecified chronic kidney disease: Secondary | ICD-10-CM | POA: Diagnosis not present

## 2023-08-11 DIAGNOSIS — I10 Essential (primary) hypertension: Secondary | ICD-10-CM | POA: Diagnosis not present

## 2023-08-11 DIAGNOSIS — G4700 Insomnia, unspecified: Secondary | ICD-10-CM

## 2023-08-11 DIAGNOSIS — E119 Type 2 diabetes mellitus without complications: Secondary | ICD-10-CM

## 2023-08-11 DIAGNOSIS — E66813 Obesity, class 3: Secondary | ICD-10-CM | POA: Insufficient documentation

## 2023-08-11 HISTORY — DX: Chronic kidney disease, stage 3a: N18.31

## 2023-08-11 LAB — GLUCOSE, CAPILLARY: Glucose-Capillary: 157 mg/dL — ABNORMAL HIGH (ref 70–99)

## 2023-08-11 MED ORDER — ASPIRIN 81 MG PO TBEC
81.0000 mg | DELAYED_RELEASE_TABLET | Freq: Every day | ORAL | Status: DC
  Administered 2023-08-12 – 2023-08-13 (×2): 81 mg via ORAL
  Filled 2023-08-11 (×2): qty 1

## 2023-08-11 MED ORDER — TRAZODONE HCL 100 MG PO TABS
100.0000 mg | ORAL_TABLET | Freq: Every day | ORAL | Status: DC
  Administered 2023-08-11 – 2023-08-12 (×2): 100 mg via ORAL
  Filled 2023-08-11 (×2): qty 1

## 2023-08-11 MED ORDER — ACETAMINOPHEN 325 MG PO TABS
650.0000 mg | ORAL_TABLET | ORAL | Status: DC | PRN
  Administered 2023-08-12 – 2023-08-13 (×4): 650 mg via ORAL
  Filled 2023-08-11 (×4): qty 2

## 2023-08-11 MED ORDER — MIRTAZAPINE 15 MG PO TABS
15.0000 mg | ORAL_TABLET | Freq: Every day | ORAL | Status: DC
  Administered 2023-08-11 – 2023-08-12 (×2): 15 mg via ORAL
  Filled 2023-08-11 (×2): qty 1

## 2023-08-11 MED ORDER — ENOXAPARIN SODIUM 40 MG/0.4ML IJ SOSY
40.0000 mg | PREFILLED_SYRINGE | INTRAMUSCULAR | Status: DC
  Administered 2023-08-11 – 2023-08-12 (×2): 40 mg via SUBCUTANEOUS
  Filled 2023-08-11 (×2): qty 0.4

## 2023-08-11 MED ORDER — ACETAMINOPHEN 650 MG RE SUPP: 650.0000 mg | RECTAL | Status: DC | PRN

## 2023-08-11 MED ORDER — QUETIAPINE FUMARATE 25 MG PO TABS
12.5000 mg | ORAL_TABLET | Freq: Every day | ORAL | Status: DC
  Administered 2023-08-11 – 2023-08-12 (×2): 12.5 mg via ORAL
  Filled 2023-08-11 (×2): qty 1

## 2023-08-11 MED ORDER — MELATONIN 3 MG PO TABS
3.0000 mg | ORAL_TABLET | Freq: Every day | ORAL | Status: DC
  Administered 2023-08-11 – 2023-08-12 (×2): 3 mg via ORAL
  Filled 2023-08-11 (×2): qty 1

## 2023-08-11 MED ORDER — STROKE: EARLY STAGES OF RECOVERY BOOK
Freq: Once | Status: AC
  Filled 2023-08-11: qty 1

## 2023-08-11 MED ORDER — TAMSULOSIN HCL 0.4 MG PO CAPS
0.4000 mg | ORAL_CAPSULE | Freq: Every day | ORAL | Status: DC
  Administered 2023-08-11 – 2023-08-12 (×2): 0.4 mg via ORAL
  Filled 2023-08-11 (×2): qty 1

## 2023-08-11 MED ORDER — GABAPENTIN 300 MG PO CAPS: 300.0000 mg | ORAL_CAPSULE | Freq: Two times a day (BID) | ORAL | Status: DC | PRN

## 2023-08-11 MED ORDER — ACETAMINOPHEN 160 MG/5ML PO SOLN: 650.0000 mg | ORAL | Status: DC | PRN

## 2023-08-11 MED ORDER — INSULIN ASPART 100 UNIT/ML IJ SOLN: 0.0000 [IU] | Freq: Every day | INTRAMUSCULAR | Status: DC

## 2023-08-11 MED ORDER — LEVOTHYROXINE SODIUM 50 MCG PO TABS
50.0000 ug | ORAL_TABLET | Freq: Every day | ORAL | Status: DC
  Administered 2023-08-12 – 2023-08-13 (×2): 50 ug via ORAL
  Filled 2023-08-11 (×2): qty 1

## 2023-08-11 MED ORDER — PRAVASTATIN SODIUM 40 MG PO TABS
20.0000 mg | ORAL_TABLET | Freq: Every evening | ORAL | Status: DC
  Administered 2023-08-11 – 2023-08-12 (×2): 20 mg via ORAL
  Filled 2023-08-11 (×2): qty 1

## 2023-08-11 MED ORDER — PANTOPRAZOLE SODIUM 20 MG PO TBEC
20.0000 mg | DELAYED_RELEASE_TABLET | Freq: Two times a day (BID) | ORAL | Status: DC
  Administered 2023-08-11 – 2023-08-13 (×4): 20 mg via ORAL
  Filled 2023-08-11 (×4): qty 1

## 2023-08-11 MED ORDER — SODIUM CHLORIDE 0.9 % IV SOLN: INTRAVENOUS | Status: DC

## 2023-08-11 MED ORDER — INSULIN ASPART 100 UNIT/ML IJ SOLN: 0.0000 [IU] | Freq: Three times a day (TID) | INTRAMUSCULAR | Status: DC

## 2023-08-11 MED ORDER — SENNOSIDES-DOCUSATE SODIUM 8.6-50 MG PO TABS: 1.0000 | ORAL_TABLET | Freq: Every evening | ORAL | Status: DC | PRN

## 2023-08-11 MED ORDER — CLOPIDOGREL BISULFATE 75 MG PO TABS
75.0000 mg | ORAL_TABLET | Freq: Every day | ORAL | Status: DC
  Administered 2023-08-12 – 2023-08-13 (×2): 75 mg via ORAL
  Filled 2023-08-11 (×2): qty 1

## 2023-08-11 MED ORDER — FAMOTIDINE 20 MG PO TABS
20.0000 mg | ORAL_TABLET | Freq: Every day | ORAL | Status: DC
  Administered 2023-08-11 – 2023-08-12 (×2): 20 mg via ORAL
  Filled 2023-08-11 (×2): qty 1

## 2023-08-11 NOTE — Discharge Summary (Signed)
 [facility] [facility]  [facility] In[facility] Discharge Summary  PCP: No primary care provider on file. Discharge Details   Admit date:         08/10/2023 Discharge date:        08/11/2023  Hospital Days:    1 days  Code Status:   No CPR Advanced Directives on file: Living Will Teacher, music of Desire for Natural Death), Healthcare Power of Attorney     Living Will (Declaration of Desire for Natural Death): No, copy requested from patient  Discharge Diagnoses:  Principal Problem:   Stroke-like symptoms Active Problems:   Type 2 diabetes mellitus without complication, without long-term current use of insulin  (*)   Essential hypertension   Adult hypothyroidism   Elevated serum creatinine   Severe obesity (BMI 35.0-35.9 with comorbidity) (*)      Follow-Up Appointments Suggested: No follow-up provider specified. Follow-Up Appointments Already Scheduled: No future appointments.  Discharge Medications:   Current Discharge Medication List    You have not been prescribed any medications.     Allergies: Allergies[1]  Consultations this Admission: IP CONSULT TO HOSPITALIST IP CONSULT TO CASE MANAGEMENT, RN/SW IP CONSULT TO STROKE NAVIGATOR/COORDINATOR IP CONSULT TO CASE MANAGEMENT, RN/SW  Procedures/Imaging:     Echocardiogram Complete W Enhancing Agent  Final Result  Technically difficult 2D M-mode and color-flow Doppler echocardiogram   demonstrates overall adequate systolic left ventricular performance.  2.  The aortic valve is thickened and demonstrates reduced leaflet   mobility.  Aortic stenosis is present but appears to be mild with   estimated aortic valve area 1.5 cm.  No aortic insufficiency is noted  3.  There is mitral annular calcification with reduced anterior leaflet   mobility.  No mitral valve area was calculated due to the difficulty of   the study.  Mild mitral stenosis cannot be definitely excluded but if   present is likely  not severe.  4.  Left atrium feels to be mildly enlarged, right atrium is normal  5.  Right ventricular structure and function is normal  6.  The pericardium is normal in thickness there is no effusion      MRI Head WO Contrast  Final Result  IMPRESSION:          Small foci of acute infarcts in the right frontal cortex, the left frontal cortex and subcortical white matter, the left basal ganglia in the right cerebellum. The distribution of the infarcts is most suggestive of an embolic source.    No edema no mass effect or midline shift.    No intracranial hemorrhage.    Involutional and ischemic gliotic changes.    ###################################################  ##########CODE CRITICAL REPORT###########  ###################################################    Electronically Signed by: Marinda Fleming, MD on 08/10/2023 8:38 PM    CT Code Stroke Head Neck Angio  Final Result  IMPRESSION:        No vascular occlusion.    Short segment high-grade stenosis of the origin of the right vertebral artery with flow distally.    Mild to moderate stenosis of the origin of the right cervical internal carotid artery with mild stenosis of the origin of the left cervical internal carotid artery I calcified atherosclerotic plaque with good flow distally to the skull base.    Short segment high-grade stenosis of the V4 segment of the right vertebral artery just proximal to the vertebrobasilar junction.    Short segment mild stenosis within the P2 segment of the left posterior cerebral artery  good flow distally.    No additional areas of stenosis noted.    No aneurysm. No dissection.            Degree of stenosis is determined using NASCET measurement technique:  Severe: 70-99%  Moderate: 50-69%.  Mild: Less than 50%.    Electronically Signed by: Marinda Fleming, MD on 08/10/2023 6:12 PM    CT Code Stroke Head WO Contrast  Final Result  IMPRESSION:    1.  No acute  intracranial abnormality.  2.  Microvascular ischemic changes.        Electronically Signed by: Sonny Appl, MD on 08/10/2023 5:36 PM    Telehealth Outside CT    (Results Pending)    Pertinent Labs:  Cardiac Labs: No results for input(s): CK, CKMB, CTNI, BNP in the last 168 hours. CBC: Recent Labs    Units 08/10/23 1718  WBC thou/mcL 6.0  HGB gm/dL 86.2  PLT thou/mcL 750   BMP: Recent Labs    Units 08/11/23 0309 08/10/23 1722 08/10/23 1718  NA mmol/L 137  --  137  K mmol/L 3.9  --  4.1  CL mmol/L 101  --  99  CO2 mmol/L 26  --  25  BUN mg/dL 18  --  19  CREATININE mg/dL 8.62* 1.4* 8.54*   Lipid Panel: Recent Labs    Units 08/10/23 1718  CHOL mg/dL 866  TRIG mg/dL 847*  HDL mg/dL 45  LDL mg/dL 58   Liver Enzymes: Recent Labs    Units 08/10/23 1718  INR  1.0   Endocrine Panels: Recent Labs    Units 08/11/23 1136 08/11/23 0822 08/11/23 0358 08/11/23 0309 08/10/23 2150 08/10/23 1718 08/10/23 1657  HGBA1C %  --   --   --   --   --  5.9*  --   GLUCOSE mg/dL 875* 879* 98 880* 863* 105* 104[facility] Course   Physicians involved in care during this hospitalization Attending Provider: Therisa KANDICE Silvan, MD Attending Provider: Yancy Sorrel, MD Attending Provider: Atlee Abernethy, MD Admitting Provider: Yancy Sorrel, MD Consulting Physician: [employer] [facility] Consulting Physician: Yancy Sorrel, MD Consulting Physician: Atlee Abernethy, MD    Hospital Course:     78 yo M evaluated for stroke like symptoms. He received ASA 81 mg PO daily, lipitor 80 mg PO qhs and plavix 75 mg PO daily.  Brain MRI showed small foci of acute infarcts in the right frontal cortex, the left frontal cortex and subcortical white matter, the left basal ganglia in the right cerebellum. The distribution of the infarcts is most suggestive of an embolic source. ECHO showed EF 60-65%. As this pt's CVA may have embolic origins the pt will be  transferred to a higher level of care with in person neurology consult.   Pt was presented to [facility]. Dr. Georgina from medicine/hospitalist service did accept the pt in transfer. [facility] is sending their own transport ([facility]) and they should be arriving at 530 pm 08/11/2023. Family is at bedside and aware of the pt being transferred to [facility] (which was the family's first choice).  BP 141/70   Pulse 75   Temp 98.2 F (36.8 C) (Oral)   Resp 17   Ht 1.74 m (5' 8.5)   Wt 117.7 kg (259 lb 7.7 oz)   SpO2 97% Comment: Simultaneous filing. User may not have seen previous data.  BMI 38.88 kg/m   Physical Exam HENT:  Head: Normocephalic.     Mouth/Throat:     Mouth: Mucous membranes are moist.  Cardiovascular:     Rate and Rhythm: Normal rate.  Pulmonary:     Effort: Pulmonary effort is normal.  Abdominal:     Palpations: Abdomen is soft.  Musculoskeletal:        General: Normal range of motion.  Skin:    General: Skin is warm.  Neurological:     Mental Status: He is alert. Mental status is at baseline.  Psychiatric:        Mood and Affect: Mood normal.     Post Hospital Care      Oxygen Orders for Discharge: O2 Device: None (Room air) SpO2: 97 %  Diet: Diet and Nourishment Orders (From admission, onward)     Start       08/10/23 1931  Cardiac diet Carb Consistent  Diet effective now       Question:  Additional restrictions:  Answer:  Carb Consistent                 Lines/Drains/Airways: Patient Lines/Drains/Airways Status     Active LDAs     Name Placement date Placement time Site Days   Peripheral IV Distal;Left;Posterior Forearm 08/10/23  --  Forearm  1            Therapy Recommendations:  PT: Anticipated Intensity of Rehab at Next Level of Care: Multiple times per week Type: HH PT Anticipated Caregiver Needs at Next Level of Care: Full time indirect DME Equipment Recommendations: Rolling walker     AM-PAC Basic Mobility  Raw Score (out of 24): 20 Routine Mobility Goal: Walk 10 Steps or More & Toilet in Bathroom 6  OT:            SLP:              Home Health Orders: DME Orders (From admission, onward)    None      Home Health Agency     None       I spent 35 minutes performing discharge services.   Electronically signed: Zackery Sira, MD 08/11/2023 / 5:05 PM     [1] Allergies Allergen Reactions  . Bupropion Dizziness and Other    REACTION: insomnia  . Escitalopram Unknown  . Sulfa Antibiotics Other    UNSPECIFIED REACTION-reports friend had a bad reaction to these and does not want to take them  . Codeine Nausea Only

## 2023-08-11 NOTE — Plan of Care (Signed)
 Hospital Medicine Transfer Accept Note Patient Name/Age: Terry Lyons / 78 y.o. MRN: [id number] Admission Date: (Not on file)  Once successfully transferred to the appropriate floor, [facility] will assume care for the patient above.  A/P: 8M h/o HTN, HLD, and DM2 p/w acute L vision blurriness w/o facial droop. MRI demonstrated multiple sites of acute infarcts c/f embolic sourse. Pt will need Neuro consult on arrival.

## 2023-08-11 NOTE — H&P (Signed)
 History and Physical    Terry Lyons FMW:[id number] DOB: April 06, 1945 DOA: 08/11/2023  PCP: [name], MD   Patient coming from: Home   Chief Complaint: Double vision, balance difficulty, vertigo   HPI: Terry Lyons is a 78 y.o. male with medical history significant for hypertension, hyperlipidemia, type 2 diabetes mellitus, PTSD, insomnia, hypothyroidism, and CKD 3A who presented to [facility] on 08/10/2023 with blurred vision, balance difficulty, and vertigo.  Patient had experienced a brief episode of vision disturbance the morning of 08/10/2023.  He reports developing double vision again that afternoon at approximately 1 PM and also was having difficulty with his balance, room spinning sensation, and nausea.  There was no focal numbness or weakness and he denies recent chest pain or palpitations.   At time of admission to [facility], the patient reports that his vision is improved but still a little blurry with both eyes.  He also complains of room spinning sensation and nausea when he turns his head certain ways or looks to the side.  [facility] ED & Hospital Course: Upon arrival to the ED, patient is found to be afebrile and hypertensive with normal HR, normal RR, and oxygen saturation in the 90s on room air.    Labs were most notable for creatinine 1.45, normal CBC, A1c 5.9%, LDL 58, triglycerides 152, and HDL 45.  Head CT was negative for acute intracranial abnormality.  CTA head and neck was performed with no vascular occlusion but short segment of high-grade stenosis was noted at the origin of the right vertebral artery and V4 segment.  MRI brain revealed small foci of acute infarction involving right frontal cortex, left frontal cortex and subcortical white matter, left basal ganglia, and right cerebellum.  Echo demonstrates EF 60 to 65% with mild aortic stenosis, and mildly enlarged left atrium.   His aspirin  81 mg was continued and he was started on  Plavix 75 mg daily.   Dr. Merrianne of neurology at [facility] agreed to see the patient in consultation.  Patient was transferred to [facility] under the hospitalist service.  Review of Systems:  All other systems reviewed and apart from HPI, are negative.  Past Medical History:  Diagnosis Date   Allergy    Arthritis    CHF (congestive heart failure) (HCC)    hospitalized in 2017; was diagnosed with an acute excacerbation.    CKD stage 3a, GFR 45-59 ml/min (HCC)    Colon polyps    adenomatous   Diabetes (HCC)    Diabetes mellitus    GERD (gastroesophageal reflux disease)    Gout    History of basal cell carcinoma    W-S Derm   History of squamous cell carcinoma    Hyperlipidemia    Hypertension    Obesity    Rosacea    Tachycardia     Past Surgical History:  Procedure Laterality Date   colonoscopy with polypectomy     X3   ESOPHAGOGASTRODUODENOSCOPY N/A 03/10/2015   Procedure: ESOPHAGOGASTRODUODENOSCOPY (EGD);  Surgeon: [name], MD;  Location: [facility];  Service: Endoscopy;  Laterality: N/A;   esophagus dilated     X3   EUS N/A 05/20/2015   Procedure: UPPER ENDOSCOPIC ULTRASOUND (EUS) RADIAL;  Surgeon: Toribio SHAUNNA Cedar, MD;  Location: [facility];  Service: Endoscopy;  Laterality: N/A;   KNEE ARTHROSCOPY     bilaterally   PILONIDAL CYST EXCISION     REPLACEMENT TOTAL KNEE Right  R   SHOULDER SURGERY     right shoulder   TONSILLECTOMY     total ethmoidectomy & sphenoidectomiy Bilateral 05/27/13   also reduction of turbinates   TOTAL KNEE ARTHROPLASTY     RIGHT   TOTAL SHOULDER ARTHROPLASTY Left 10/04/2017   Procedure: LEFT TOTAL SHOULDER ARTHROPLASTY;  Surgeon: [name], MD;  Location: [facility];  Service: Orthopedics;  Laterality: Left;    Social History:   reports that he quit smoking about 49 years ago. His smoking use included cigarettes. He has never used smokeless tobacco. He reports that he does not drink alcohol  and does not use  drugs.  Allergies  Allergen Reactions   Sulfa Antibiotics     UNSPECIFIED REACTION    Bupropion Hcl Other (See Comments)    REACTION: insomnia   Codeine Nausea Only   Escitalopram Oxalate Other (See Comments)    REACTION: insomnia   Sertraline Hcl Other (See Comments)    REACTION: insomnia    Family History  Problem Relation Age of Onset   Lung cancer Mother        smoker   Heart disease Father        Rheumatic Heart Disease   Heart attack Brother 74       CBAG   Heart attack Paternal Grandmother 41   Heart attack Paternal Grandfather 77   Lung cancer Maternal Aunt        smoker   Liver disease Neg Hx    Esophageal cancer Neg Hx    Colon cancer Neg Hx      Prior to Admission medications   Medication Sig Start Date End Date Taking? Authorizing Provider  Acetaminophen  500 MG coapsule Take 1 capsule by mouth every 6 (six) hours as needed for fever.    [provider]  allopurinol  (ZYLOPRIM ) 300 MG tablet Take 300 mg by mouth daily.    [provider]  aspirin  EC 81 MG tablet Take 81 mg by mouth daily.    [provider]  carvedilol  (COREG ) 25 MG tablet Take 1 tablet (25 mg total) by mouth 2 (two) times daily with a meal. 01/26/20   Maranda Leim DEL, MD  cetirizine  (ZYRTEC ) 10 MG tablet Take 10 mg by mouth daily.    [provider]  Cholecalciferol (VITAMIN D PO) Take 5,000 Units by mouth every other day.    [provider]  Coenzyme Q10 (CO Q 10 PO) Take 1 capsule by mouth daily.     [provider]  cyclobenzaprine  (FLEXERIL ) 10 MG tablet Take 1 tablet (10 mg total) by mouth 3 (three) times daily as needed for muscle spasms. 10/05/17   Shuford, Randine, PA-C  doxazosin  (CARDURA ) 8 MG tablet Take 4 mg by mouth daily.    [provider]  famotidine  (PEPCID ) 40 MG tablet TAKE ONE TABLET BY MOUTH AT BEDTIME -  THIS IS FOR REFLUX UP TO THE THROAT LEVEL. 12/20/20 12/21/21  [provider]  fenofibrate  160 MG  tablet Take 1 tablet (160 mg total) by mouth daily. 02/23/20   Maranda Leim DEL, MD  furosemide  (LASIX ) 40 MG tablet Take 1 tablet (40 mg total) by mouth 2 (two) times daily. Patient taking differently: Take 40 mg by mouth daily. 06/30/16   Maranda Leim DEL, MD  gabapentin  (NEURONTIN ) 300 MG capsule Take 1 capsule by mouth 2 (two) times daily. 12/17/20 10/14/21  [provider]  levothyroxine  (SYNTHROID , LEVOTHROID) 50 MCG tablet Take 50 mcg by mouth daily before  breakfast.    [provider]  Menthol -Methyl Salicylate (THERA-GESIC EX) Apply 1 application topically 4 (four) times daily as needed (knee pain.).     [provider]  metFORMIN  (GLUCOPHAGE ) 500 MG tablet Take 500 mg by mouth 2 (two) times daily with a meal.    [provider]  mirtazapine  (REMERON ) 30 MG tablet Take 30 mg by mouth at bedtime.    [provider]  Multiple Vitamin (MULTIVITAMIN WITH MINERALS) TABS tablet Take 1 tablet by mouth daily. PACKET    [provider]  Omega-3 Fatty Acids (FISH OIL) 1000 MG CAPS Take 1 capsule by mouth daily.    [provider]  omeprazole  (PRILOSEC) 20 MG capsule Take 40 mg by mouth 2 (two) times daily.     [provider]  potassium chloride  (K-DUR) 10 MEQ tablet Take 10 mEq by mouth daily.    [provider]  pravastatin  (PRAVACHOL ) 20 MG tablet Take 1 tablet (20 mg total) by mouth every evening. 09/04/19 01/16/21  Maranda Leim DEL, MD  QUEtiapine  (SEROQUEL  XR) 50 MG TB24 24 hr tablet Take 150 mg by mouth at bedtime.     [provider]  Saw Palmetto, Serenoa repens, (SAW PALMETTO PO) Take 1 capsule by mouth daily.    [provider]  tamsulosin  (FLOMAX ) 0.4 MG CAPS capsule Take 1 capsule (0.4 mg total) by mouth daily after supper. 01/18/21   Dennise Lavada POUR, MD    Physical Exam: Vitals:   08/11/23 1830 08/11/23 2005  BP: (!) 155/74 135/61  Pulse: 78 79  Resp: 16 18  Temp: 98.3 F (36.8 C)  (!) 97.5 F (36.4 C)  TempSrc: Oral Oral  SpO2: 99% 98%    Constitutional: NAD, no pallor or diaphoresis  Eyes: PERTLA, lids and conjunctivae normal ENMT: Mucous membranes are moist. Posterior pharynx clear of any exudate or lesions.   Neck: supple, no masses  Respiratory: no wheezing, no crackles. No accessory muscle use.  Cardiovascular: S1 & S2 heard, regular rate and rhythm. No JVD.  Abdomen: No tenderness, soft. Bowel sounds active.  Musculoskeletal: no clubbing / cyanosis. No joint deformity upper and lower extremities.   Skin: no significant rashes, lesions, ulcers. Warm, dry, well-perfused. Neurologic: Subtle left lower facial weakness, CN 2-12 grossly intact otherwise. Sensation to light touch intact. Strength 5/5 in all 4 limbs. Alert and oriented.  Psychiatric: Calm. Cooperative.    Labs and Imaging on Admission: I have personally reviewed following labs and imaging studies  CBC: No results for input(s): WBC, NEUTROABS, HGB, HCT, MCV, PLT in the last 168 hours. Basic Metabolic Panel: No results for input(s): NA, K, CL, CO2, GLUCOSE, BUN, CREATININE, CALCIUM , MG, PHOS in the last 168 hours. GFR: CrCl cannot be calculated (Patient's most recent lab result is older than the maximum 21 days allowed.). Liver Function Tests: No results for input(s): AST, ALT, ALKPHOS, BILITOT, PROT, ALBUMIN in the last 168 hours. No results for input(s): LIPASE, AMYLASE in the last 168 hours. No results for input(s): AMMONIA in the last 168 hours. Coagulation Profile: No results for input(s): INR, PROTIME in the last 168 hours. Cardiac Enzymes: No results for input(s): CKTOTAL, CKMB, CKMBINDEX, TROPONINI in the last 168 hours. BNP (last 3 results) No results for input(s): PROBNP in the last 8760 hours. HbA1C: No results for input(s): HGBA1C in the last 72 hours. CBG: No results for input(s): GLUCAP in the last 168  hours. Lipid Profile: No results for input(s): CHOL, HDL, LDLCALC, TRIG,  CHOLHDL, LDLDIRECT in the last 72 hours. Thyroid  Function Tests: No results for input(s): TSH, T4TOTAL, FREET4, T3FREE, THYROIDAB in the last 72 hours. Anemia Panel: No results for input(s): VITAMINB12, FOLATE, FERRITIN, TIBC, IRON, RETICCTPCT in the last 72 hours. Urine analysis:    Component Value Date/Time   COLORURINE AMBER (A) 01/16/2021 1055   APPEARANCEUR CLOUDY (A) 01/16/2021 1055   LABSPEC 1.026 01/16/2021 1055   PHURINE 5.0 01/16/2021 1055   GLUCOSEU NEGATIVE 01/16/2021 1055   HGBUR LARGE (A) 01/16/2021 1055   BILIRUBINUR NEGATIVE 01/16/2021 1055   BILIRUBINUR Neg 07/11/2011 1637   KETONESUR NEGATIVE 01/16/2021 1055   PROTEINUR 100 (A) 01/16/2021 1055   UROBILINOGEN 1.0 10/24/2014 0219   NITRITE NEGATIVE 01/16/2021 1055   LEUKOCYTESUR MODERATE (A) 01/16/2021 1055   Sepsis Labs: @LABRCNTIP (procalcitonin:4,lacticidven:4) )No results found for this or any previous visit (from the past 240 hours).   Radiological Exams on Admission: No results found.  EKG: Independently reviewed. Sinus rhythm.   Assessment/Plan   1. Acute ischemic CVA   - Continue cardiac monitoring, neuro checks, ASA, Plavix, and pravastatin , consult neurology, PT, OT, SLP    2. Hypertension  - Permit hypertension in acute-phase of ischemic CVA    3. Type II DM  - Check CBGs, use low-intensity SSI if needed    4. CKD 3A  - Appears close to baseline  - Renally-dose medications    5. Hypothyroidism  - Synthroid    6. Insomnia; PTSD   - Continue Seroquel , Remeron , trazodone, melatonin     DVT prophylaxis: Lovenox   Code Status: DNR  Level of Care: Level of care: Telemetry Medical Family Communication: Daughter at bedside  Disposition Plan:  Patient is from: Home  Anticipated d/c is to: TBD Anticipated d/c date is: 08/12/23  Patient currently: Pending neurology consultation, therapy  assessments, disposition planning  Consults called: Neurology  Admission status: Observation     Evalene GORMAN Sprinkles, MD [facility]  08/11/2023, 8:25 PM

## 2023-08-11 NOTE — Consult Note (Incomplete)
 NEUROLOGY CONSULT NOTE   Date of service: August 11, 2023 Patient Name: Terry Lyons MRN:  [id number] DOB:  1946/02/02 Chief Complaint: Double vision, balance difficulties, dizziness Requesting Provider: Charlton Evalene RAMAN, MD  History of Present Illness  Terry Lyons is a 78 y.o. male with hx of type 2 diabetes, hypertension, hyperlipidemia, CKD stage IIIa, PTSD  LKW: *** Modified rankin score: {Modified Rankin Scale:21264} IV Thrombolysis: ***Yes, *** No (reason) EVT: ***Yes, *** No (reason) ICH Score:***  NIHSS components Score: Comment  1a Level of Conscious 0[]  1[]  2[]  3[]      1b LOC Questions 0[]  1[]  2[]       1c LOC Commands 0[]  1[]  2[]       2 Best Gaze 0[]  1[]  2[]       3 Visual 0[]  1[]  2[]  3[]      4 Facial Palsy 0[]  1[]  2[]  3[]      5a Motor Arm - left 0[]  1[]  2[]  3[]  4[]  UN[]    5b Motor Arm - Right 0[]  1[]  2[]  3[]  4[]  UN[]    6a Motor Leg - Left 0[]  1[]  2[]  3[]  4[]  UN[]    6b Motor Leg - Right 0[]  1[]  2[]  3[]  4[]  UN[]    7 Limb Ataxia 0[]  1[]  2[]  UN[]      8 Sensory 0[]  1[]  2[]  UN[]      9 Best Language 0[]  1[]  2[]  3[]      10 Dysarthria 0[]  1[]  2[]  UN[]      11 Extinct. and Inattention 0[]  1[]  2[]       TOTAL:       ROS  ***Comprehensive ROS performed and pertinent positives documented in HPI  ***Unable to ascertain due to ***  Past History   Past Medical History:  Diagnosis Date   Allergy    Arthritis    CHF (congestive heart failure) (HCC)    hospitalized in 2017; was diagnosed with an acute excacerbation.    CKD stage 3a, GFR 45-59 ml/min (HCC)    Colon polyps    adenomatous   Diabetes (HCC)    Diabetes mellitus    GERD (gastroesophageal reflux disease)    Gout    History of basal cell carcinoma    W-S Derm   History of squamous cell carcinoma    Hyperlipidemia    Hypertension    Obesity    Rosacea    Tachycardia     Past Surgical History:  Procedure Laterality Date   colonoscopy with polypectomy     X3   ESOPHAGOGASTRODUODENOSCOPY N/A 03/10/2015    Procedure: ESOPHAGOGASTRODUODENOSCOPY (EGD);  Surgeon: [name], MD;  Location: [facility];  Service: Endoscopy;  Laterality: N/A;   esophagus dilated     X3   EUS N/A 05/20/2015   Procedure: UPPER ENDOSCOPIC ULTRASOUND (EUS) RADIAL;  Surgeon: Toribio SHAUNNA Cedar, MD;  Location: [facility];  Service: Endoscopy;  Laterality: N/A;   KNEE ARTHROSCOPY     bilaterally   PILONIDAL CYST EXCISION     REPLACEMENT TOTAL KNEE Right    R   SHOULDER SURGERY     right shoulder   TONSILLECTOMY     total ethmoidectomy & sphenoidectomiy Bilateral 05/27/13   also reduction of turbinates   TOTAL KNEE ARTHROPLASTY     RIGHT   TOTAL SHOULDER ARTHROPLASTY Left 10/04/2017   Procedure: LEFT TOTAL SHOULDER ARTHROPLASTY;  Surgeon: [name], MD;  Location: [facility];  Service: Orthopedics;  Laterality: Left;    Family History: Family History  Problem Relation Age of Onset   Lung cancer  Mother        smoker   Heart disease Father        Rheumatic Heart Disease   Heart attack Brother 50       CBAG   Heart attack Paternal Grandmother 19   Heart attack Paternal Grandfather 66   Lung cancer Maternal Aunt        smoker   Liver disease Neg Hx    Esophageal cancer Neg Hx    Colon cancer Neg Hx     Social History  reports that he quit smoking about 49 years ago. His smoking use included cigarettes. He has never used smokeless tobacco. He reports that he does not drink alcohol  and does not use drugs.  Allergies  Allergen Reactions   Sulfa Antibiotics     UNSPECIFIED REACTION    Bupropion Hcl Other (See Comments)    REACTION: insomnia   Codeine Nausea Only   Escitalopram Oxalate Other (See Comments)    REACTION: insomnia   Sertraline Hcl Other (See Comments)    REACTION: insomnia    Medications   Current Facility-Administered Medications:    [START ON 08/12/2023]  stroke: early stages of recovery book, , Does not apply, Once, Opyd, Timothy S, MD   0.9 %  sodium chloride  infusion, ,  Intravenous, Continuous, Opyd, Evalene RAMAN, MD, Last Rate: 40 mL/hr at 08/11/23 2128, New Bag at 08/11/23 2128   acetaminophen  (TYLENOL ) tablet 650 mg, 650 mg, Oral, Q4H PRN **OR** acetaminophen  (TYLENOL ) 160 MG/5ML solution 650 mg, 650 mg, Per Tube, Q4H PRN **OR** acetaminophen  (TYLENOL ) suppository 650 mg, 650 mg, Rectal, Q4H PRN, Opyd, Timothy S, MD   [START ON 08/12/2023] aspirin  EC tablet 81 mg, 81 mg, Oral, Daily, Opyd, Timothy S, MD   [START ON 08/12/2023] clopidogrel (PLAVIX) tablet 75 mg, 75 mg, Oral, Daily, Opyd, Timothy S, MD   enoxaparin  (LOVENOX ) injection 40 mg, 40 mg, Subcutaneous, Q24H, Opyd, Timothy S, MD, 40 mg at 08/11/23 2131   famotidine  (PEPCID ) tablet 20 mg, 20 mg, Oral, QHS, Opyd, Timothy S, MD   gabapentin  (NEURONTIN ) capsule 300 mg, 300 mg, Oral, BID PRN, Opyd, Timothy S, MD   insulin  aspart (novoLOG ) injection 0-5 Units, 0-5 Units, Subcutaneous, QHS, Opyd, Timothy S, MD   [START ON 08/12/2023] insulin  aspart (novoLOG ) injection 0-6 Units, 0-6 Units, Subcutaneous, TID WC, Opyd, Timothy S, MD   [START ON 08/12/2023] levothyroxine  (SYNTHROID ) tablet 50 mcg, 50 mcg, Oral, QAC breakfast, Opyd, Timothy S, MD   melatonin tablet 3 mg, 3 mg, Oral, QHS, Opyd, Timothy S, MD   mirtazapine  (REMERON ) tablet 15 mg, 15 mg, Oral, QHS, Opyd, Timothy S, MD   pantoprazole  (PROTONIX ) EC tablet 20 mg, 20 mg, Oral, BID, Opyd, Timothy S, MD   pravastatin  (PRAVACHOL ) tablet 20 mg, 20 mg, Oral, QPM, Opyd, Timothy S, MD   QUEtiapine  (SEROQUEL ) tablet 12.5 mg, 12.5 mg, Oral, QHS, Opyd, Timothy S, MD   senna-docusate (Senokot-S) tablet 1 tablet, 1 tablet, Oral, QHS PRN, Opyd, Evalene RAMAN, MD   tamsulosin  (FLOMAX ) capsule 0.4 mg, 0.4 mg, Oral, QPC supper, Opyd, Timothy S, MD   traZODone (DESYREL) tablet 100 mg, 100 mg, Oral, QHS, Opyd, Timothy S, MD  Vitals   Vitals:   08/11/23 1830 08/11/23 2005  BP: (!) 155/74 135/61  Pulse: 78 79  Resp: 16 18  Temp: 98.3 F (36.8 C) (!) 97.5 F (36.4 C)   TempSrc: Oral Oral  SpO2: 99% 98%    There is no height or weight on  file to calculate BMI.   Physical Exam   Constitutional: Appears well-developed and well-nourished.  Psych: Affect appropriate to situation, *** Eyes: No scleral injection HENT: No oropharyngeal obstruction.  MSK: no joint deformities.  Cardiovascular: Normal rate and regular rhythm. *** Perfusing extremities well Respiratory: Effort normal, non-labored breathing GI: Soft.  No distension. There is no tenderness.  Skin: Warm dry and intact visible skin  Neurologic Examination   Mental Status: Patient is awake, alert, oriented to person, place, month, year, and situation.*** Patient is able to give a clear and coherent history.*** No signs of aphasia or neglect*** Cranial Nerves: II: Visual Fields are full. Pupils are equal, round, and reactive to light.  *** III,IV, VI: EOMI without ptosis or diploplia.  V: Facial sensation is symmetric to temperature VII: Facial movement is symmetric.  VIII: hearing is intact to voice X: Uvula elevates symmetrically XI: Shoulder shrug is symmetric. XII: tongue is midline without atrophy or fasciculations.  Motor: Tone is normal. Bulk is normal. 5/5 strength was present in all four extremities. *** Sensory: Sensation is symmetric to light touch and temperature in the arms and legs.*** Deep Tendon Reflexes: 2+ and symmetric in the brachioradialis and patellae. *** Plantars: Toes are downgoing bilaterally. *** Cerebellar: FNF and HKS are intact bilaterally*** Gait:  Deferred in acute setting ***   Labs/Imaging/Neurodiagnostic studies   CBC: No results for input(s): WBC, NEUTROABS, HGB, HCT, MCV, PLT in the last 168 hours. Basic Metabolic Panel:  Lab Results  Component Value Date   NA 136 01/18/2021   K 3.7 01/18/2021   CO2 22 01/18/2021   GLUCOSE 151 (H) 01/18/2021   BUN 17 01/18/2021   CREATININE 1.34 (H) 01/18/2021   CALCIUM  8.3 (L) 01/18/2021    GFRNONAA 55 (L) 01/18/2021   GFRAA 62 02/20/2020   Lipid Panel:  Lab Results  Component Value Date   LDLCALC 46 02/20/2020   HgbA1c:  Lab Results  Component Value Date   HGBA1C 5.8 (H) 01/16/2021   INR  Lab Results  Component Value Date   INR 1.2 01/16/2021   APTT  Lab Results  Component Value Date   APTT 28 06/09/2008   AED levels: No results found for: PHENYTOIN, ZONISAMIDE, LAMOTRIGINE, LEVETIRACETA  CT Head without contrast(Personally reviewed): ***  CT angio Head and Neck with contrast(report reviewed, [employer] was requested but not completed by transferring facility): No vascular occlusion.  Short segment high-grade stenosis of the origin of the right vertebral artery with flow distally.  Mild to moderate stenosis of the origin of the right cervical internal carotid artery with mild stenosis of the origin of the left cervical internal carotid artery I calcified atherosclerotic plaque with good flow distally to the skull base.  Short segment high-grade stenosis of the V4 segment of the right vertebral artery just proximal to the vertebrobasilar junction.  Short segment mild stenosis within the P2 segment of the left posterior cerebral artery good flow distally.  No additional areas of stenosis noted.  No aneurysm. No dissection.   MRI Brain(report reviewed, [employer] was requested but not completed by transferring facility): Small foci of acute infarcts in the right frontal cortex, the left frontal cortex and subcortical white matter, the left basal ganglia in the right cerebellum. The distribution of the infarcts is most suggestive of an embolic source.  No edema no mass effect or midline shift.  No intracranial hemorrhage.  Involutional and ischemic gliotic changes.   Neurodiagnostics ECHO report from [facility]:  Technically difficult 2D  M-mode and color-flow Doppler echocardiogram  demonstrates overall adequate systolic left ventricular  performance.  2.  The aortic valve is thickened and demonstrates reduced leaflet  mobility.  Aortic stenosis is present but appears to be mild with  estimated aortic valve area 1.5 cm.  No aortic insufficiency is noted  3.  There is mitral annular calcification with reduced anterior leaflet  mobility.  No mitral valve area was calculated due to the difficulty of  the study.  Mild mitral stenosis cannot be definitely excluded but if  present is likely not severe.  4.  Left atrium feels to be mildly enlarged, right atrium is normal  5.  Right ventricular structure and function is normal  6.  The pericardium is normal in thickness there is no effusion   ASSESSMENT   CONSTANTIN HILLERY is a 78 y.o. male ***  RECOMMENDATIONS  *** ______________________________________________________________________    Bonney Lola LITTIE Jerrie, MD [facility]

## 2023-08-12 ENCOUNTER — Observation Stay (HOSPITAL_BASED_OUTPATIENT_CLINIC_OR_DEPARTMENT_OTHER)

## 2023-08-12 DIAGNOSIS — E66813 Obesity, class 3: Secondary | ICD-10-CM | POA: Insufficient documentation

## 2023-08-12 DIAGNOSIS — I6501 Occlusion and stenosis of right vertebral artery: Secondary | ICD-10-CM

## 2023-08-12 DIAGNOSIS — I6340 Cerebral infarction due to embolism of unspecified cerebral artery: Secondary | ICD-10-CM | POA: Diagnosis not present

## 2023-08-12 DIAGNOSIS — R42 Dizziness and giddiness: Secondary | ICD-10-CM | POA: Diagnosis not present

## 2023-08-12 DIAGNOSIS — I129 Hypertensive chronic kidney disease with stage 1 through stage 4 chronic kidney disease, or unspecified chronic kidney disease: Secondary | ICD-10-CM | POA: Diagnosis not present

## 2023-08-12 DIAGNOSIS — E1122 Type 2 diabetes mellitus with diabetic chronic kidney disease: Secondary | ICD-10-CM | POA: Diagnosis not present

## 2023-08-12 DIAGNOSIS — I639 Cerebral infarction, unspecified: Secondary | ICD-10-CM | POA: Diagnosis not present

## 2023-08-12 DIAGNOSIS — E785 Hyperlipidemia, unspecified: Secondary | ICD-10-CM

## 2023-08-12 DIAGNOSIS — N1831 Chronic kidney disease, stage 3a: Secondary | ICD-10-CM | POA: Diagnosis not present

## 2023-08-12 DIAGNOSIS — E039 Hypothyroidism, unspecified: Secondary | ICD-10-CM | POA: Diagnosis not present

## 2023-08-12 DIAGNOSIS — R609 Edema, unspecified: Secondary | ICD-10-CM | POA: Diagnosis not present

## 2023-08-12 LAB — COMPREHENSIVE METABOLIC PANEL WITH GFR
ALT: 16 U/L (ref 0–44)
AST: 21 U/L (ref 15–41)
Albumin: 3.2 g/dL — ABNORMAL LOW (ref 3.5–5.0)
Alkaline Phosphatase: 45 U/L (ref 38–126)
Anion gap: 7 (ref 5–15)
BUN: 16 mg/dL (ref 8–23)
CO2: 25 mmol/L (ref 22–32)
Calcium: 8.5 mg/dL — ABNORMAL LOW (ref 8.9–10.3)
Chloride: 105 mmol/L (ref 98–111)
Creatinine, Ser: 1.24 mg/dL (ref 0.61–1.24)
GFR, Estimated: 59 mL/min — ABNORMAL LOW (ref >60)
Glucose, Bld: 112 mg/dL — ABNORMAL HIGH (ref 70–99)
Potassium: 3.7 mmol/L (ref 3.5–5.1)
Sodium: 137 mmol/L (ref 135–145)
Total Bilirubin: 1.1 mg/dL (ref 0.0–1.2)
Total Protein: 6.3 g/dL — ABNORMAL LOW (ref 6.5–8.1)

## 2023-08-12 LAB — CBC
HCT: 39.4 % (ref 39.0–52.0)
Hemoglobin: 13.3 g/dL (ref 13.0–17.0)
MCH: 31.6 pg (ref 26.0–34.0)
MCHC: 33.8 g/dL (ref 30.0–36.0)
MCV: 93.6 fL (ref 80.0–100.0)
Platelets: 239 10*3/uL (ref 150–400)
RBC: 4.21 MIL/uL — ABNORMAL LOW (ref 4.22–5.81)
RDW: 14.6 % (ref 11.5–15.5)
WBC: 5.5 10*3/uL (ref 4.0–10.5)
nRBC: 0 % (ref 0.0–0.2)

## 2023-08-12 LAB — GLUCOSE, CAPILLARY
Glucose-Capillary: 111 mg/dL — ABNORMAL HIGH (ref 70–99)
Glucose-Capillary: 119 mg/dL — ABNORMAL HIGH (ref 70–99)
Glucose-Capillary: 121 mg/dL — ABNORMAL HIGH (ref 70–99)
Glucose-Capillary: 119 mg/dL — ABNORMAL HIGH (ref 70–99)

## 2023-08-12 LAB — AMMONIA: Ammonia: 26 umol/L (ref 9–35)

## 2023-08-12 LAB — VITAMIN B12: Vitamin B-12: 227 pg/mL (ref 180–914)

## 2023-08-12 LAB — TSH: TSH: 3.582 u[IU]/mL (ref 0.350–4.500)

## 2023-08-12 LAB — HIV ANTIBODY (ROUTINE TESTING W REFLEX): HIV Screen 4th Generation wRfx: NONREACTIVE

## 2023-08-12 MED ORDER — ONDANSETRON HCL 4 MG PO TABS
4.0000 mg | ORAL_TABLET | Freq: Three times a day (TID) | ORAL | Status: DC | PRN
  Administered 2023-08-12: 4 mg via ORAL
  Filled 2023-08-12: qty 1

## 2023-08-12 MED ORDER — POLYETHYLENE GLYCOL 3350 17 G PO PACK
17.0000 g | PACK | Freq: Every day | ORAL | Status: DC
  Administered 2023-08-12 – 2023-08-13 (×2): 17 g via ORAL
  Filled 2023-08-12 (×2): qty 1

## 2023-08-12 MED ORDER — DICLOFENAC SODIUM 1 % EX GEL
2.0000 g | Freq: Four times a day (QID) | CUTANEOUS | Status: DC
  Administered 2023-08-12 – 2023-08-13 (×3): 2 g via TOPICAL
  Filled 2023-08-12: qty 100

## 2023-08-12 MED ORDER — POLYVINYL ALCOHOL 1.4 % OP SOLN
1.0000 [drp] | OPHTHALMIC | Status: DC | PRN
  Administered 2023-08-13 (×2): 1 [drp] via OPHTHALMIC
  Filled 2023-08-12: qty 15

## 2023-08-12 NOTE — Progress Notes (Addendum)
 6 STROKE TEAM PROGRESS NOTE   INTERIM HISTORY/SUBJECTIVE He presented with vision and speech difficulties as well as feeling of off balance and dizziness.  MRI scan shows multiple embolic mostly supratentorial infarcts at an outside hospital.  CT angiogram shows high-grade stenosis of proximal right vertebral origin as well as right V4 stenosis with no corresponding MRI infarct noted. DVT study pending and loop recorder requested. He does report some dizziness with head movements. No nystagmus on exam.  PT Vestibular eval ordered  OBJECTIVE  CBC    Component Value Date/Time   WBC 5.5 08/12/2023 0717   RBC 4.21 (L) 08/12/2023 0717   HGB 13.3 08/12/2023 0717   HGB 15.3 02/20/2020 1213   HCT 39.4 08/12/2023 0717   HCT 46.4 02/20/2020 1213   PLT 239 08/12/2023 0717   PLT 219 02/20/2020 1213   MCV 93.6 08/12/2023 0717   MCV 92 02/20/2020 1213   MCH 31.6 08/12/2023 0717   MCHC 33.8 08/12/2023 0717   RDW 14.6 08/12/2023 0717   RDW 13.9 02/20/2020 1213   LYMPHSABS 1.4 01/18/2021 0049   MONOABS 0.9 01/18/2021 0049   EOSABS 0.2 01/18/2021 0049   BASOSABS 0.0 01/18/2021 0049    BMET    Component Value Date/Time   NA 137 08/12/2023 0717   NA 140 02/20/2020 1213   K 3.7 08/12/2023 0717   CL 105 08/12/2023 0717   CO2 25 08/12/2023 0717   GLUCOSE 112 (H) 08/12/2023 0717   BUN 16 08/12/2023 0717   BUN 13 02/20/2020 1213   CREATININE 1.24 08/12/2023 0717   CALCIUM  8.5 (L) 08/12/2023 0717   GFRNONAA 59 (L) 08/12/2023 0717    IMAGING past 24 hours No results found.  Vitals:   08/11/23 2005 08/11/23 2316 08/12/23 0417 08/12/23 0811  BP: 135/61 (!) 112/58 (!) 140/80 135/72  Pulse: 79 84 80 86  Resp: 18 18 18 18   Temp: (!) 97.5 F (36.4 C) 98.5 F (36.9 C) 98.4 F (36.9 C) 97.8 F (36.6 C)  TempSrc: Oral Oral Oral Oral  SpO2: 98% 97% 99% 95%     PHYSICAL EXAM General:  Alert, well-nourished, well-developed elderly Caucasian male in no acute distress Psych:  Mood and  affect appropriate for situation CV: Regular rate and rhythm on monitor Respiratory:  Regular, unlabored respirations on room air GI: Abdomen soft and nontender   NEURO:  Mental Status: AA&Ox3, patient is able to give clear and coherent history Speech/Language: speech is without dysarthria or aphasia.  Naming, repetition, fluency, and comprehension intact.  Cranial Nerves:  II: PERRL. Visual fields full.  III, IV, VI: EOMI. Eyelids elevate symmetrically.  V: Sensation is intact to light touch and symmetrical to face.  VII: Face is symmetrical resting and smiling VIII: hearing intact to voice. IX, X: Palate elevates symmetrically. Phonation is normal.  KP:Dynloizm shrug 5/5. XII: tongue is midline without fasciculations. Motor: 5/5 strength to all muscle groups tested.  Tone: is normal and bulk is normal Sensation- Intact to light touch bilaterally. Extinction absent to light touch to DSS.   Coordination: FTN intact bilaterally, HKS: no ataxia in BLE.No drift.  Gait- deferred   ASSESSMENT/PLAN  [name] is a 78 y.o. male with history of  type 2 diabetes, hypertension, hyperlipidemia, CKD stage IIIa, PTSD presenting with visual disturbance and reported feeling funny.    Acute Ischemic Infarct:  right frontal infarct and left frontal infarct left basal ganglia and right cerebellum  Etiology:  likely cardio embolic given multiple vascular  territories involvement Code Stroke CT head No acute abnormality. ASPECTS 10.    CTA head & neck no vascular occlusion but short segment of high-grade stenosis was noted at the origin of the right vertebral artery and V4 segment MRI  small foci of acute infarction involving right frontal cortex, left frontal cortex and subcortical white matter, left basal ganglia, and right cerebellum  2D Echo EF 60 to 65% with mild aortic stenosis, and mildly enlarged left atrium  Venous duplex - pending LDL 58 HgbA1c 5.9 VTE prophylaxis -  lovenox  aspirin  81 mg daily prior to admission, now on aspirin  81 mg daily and clopidogrel 75 mg daily for  3 weeks and then plavix alone Disposition:  pending   Hypertension Home meds:  Coreg  Stable Blood Pressure Goal: BP less than 220/110   Hyperlipidemia Home meds:  pravastatin , resumed in hospital LDL 58, goal < 70 Continue statin at discharge  Diabetes type II Controlled Home meds:  metformin  HgbA1c 5.8, goal < 7.0 CBGs SSI Recommend close follow-up with PCP for better DM control  Other Active Problems Dizziness Vestibular PT ordered  Hospital day # 1  Patient seen and examined by NP/APP with MD. MD to update note as needed.   Jorene Last, DNP, FNP-BC [facility] Pager: [phone]  I have personally obtained history,examined this patient, reviewed notes, independently viewed imaging studies, participated in medical decision making and plan of care.ROS completed by me personally and pertinent positives fully documented  I have made any additions or clarifications directly to the above note. Agree with note above.  Patient presented with symptoms of dizziness, off-balance and vision difficulties suggesting brainstem ischemia but MRI does not show brainstem stroke but does show multiple ischemic strokes in different vascular territories raising concerns for central cardiac source of embolism.  CT angiogram shows high-grade proximal right vertebral artery origin and terminal right vertebral artery stenosis but no infarcts in this distribution.  Recommend dual antiplatelet therapy aspirin  Plavix for 3 weeks followed by Plavix alone and aggressive risk factor modification.  Patient will need prolonged cardiac monitoring at discharge and recommend loop recorder.  Discussed with Dr. Jonel and patient.  Greater than 50% time during this 50-minute visit was spent in counseling and coordination of care and discussion with patient and care team and answering  questions.  Eather Popp, MD Medical Director [facility] Pager: [phone] 08/12/2023 2:41 PM   To contact Stroke Continuity provider, please refer to [email or web address]. After hours, contact General Neurology

## 2023-08-12 NOTE — Plan of Care (Signed)
  Problem: Health Behavior/Discharge Planning: Goal: Ability to manage health-related needs will improve Outcome: Progressing   Problem: Clinical Measurements: Goal: Will remain free from infection Outcome: Progressing   Problem: Nutrition: Goal: Adequate nutrition will be maintained Outcome: Progressing   Problem: Coping: Goal: Level of anxiety will decrease Outcome: Progressing   Problem: Safety: Goal: Ability to remain free from injury will improve Outcome: Progressing   Problem: Skin Integrity: Goal: Risk for impaired skin integrity will decrease Outcome: Progressing   Problem: Ischemic Stroke/TIA Tissue Perfusion: Goal: Complications of ischemic stroke/TIA will be minimized Outcome: Progressing   Problem: Coping: Goal: Will verbalize positive feelings about self Outcome: Progressing   Problem: Self-Care: Goal: Ability to participate in self-care as condition permits will improve Outcome: Progressing

## 2023-08-12 NOTE — Evaluation (Addendum)
 Physical Therapy Evaluation Patient Details Name: Terry Lyons MRN: [id number] DOB: [birth year] Today's Date: 08/12/2023  History of Present Illness  Pt is an 78 yo male presenting to [facility] on 08/11/23 from [place] with blurred vision, balance difficulty, and vertigo. CT head was negative, MRI revealed small foci of acute infarction involving right frontal cortex, left frontal cortex and subcortical white matter, left basal ganglia, and right cerebellum.  PMH of GERD, gout, CKD III, basal cell carcinoma, HLD, HTN, obesity, Rosacea, DM, L TSA (2019).   Clinical Impression  Terry Lyons is 78 y.o. male admitted with above HPI and diagnosis. Patient is currently limited by functional impairments below (see PT problem list). Patient lives with spouse and is mod ind with SPC for mobility at baseline. Currently he is limited by visual and balance deficits secondary to CVA and chronic Lt knee pain/weakness. Pt requires CGA for safety with transfers and gait and reliant on RW to stabilize balance and Lt knee in stance. Patient amb ~150' and ascend/descend 2 steps with CGA. Vestibular/vision testing completed and Lt rotational up-beating noted with Lt gaze and Lt upward gaze. Pt required corrective saccade to over from Rt gaze to midline. Educated on gaze stabilization during gait and for x1 exercises while resting. Reviewed BEFAST and lifestyle changes to reduce risk of additional stroke. Patient will benefit from continued skilled PT interventions to address impairments and progress independence with mobility, recommending OP neuro PT follow up for balance training. Acute PT will follow and progress as able.         If plan is discharge home, recommend the following: A little help with walking and/or transfers;A little help with bathing/dressing/bathroom;Assistance with cooking/housework;Direct supervision/assist for medications management;Assist for transportation   Can travel by private vehicle        Equipment  Recommendations Rolling walker (2 wheels) (may need to get through [place])  Recommendations for Other Services       Functional Status Assessment Patient has had a recent decline in their functional status and demonstrates the ability to make significant improvements in function in a reasonable and predictable amount of time.     Precautions / Restrictions Precautions Precautions: Fall Recall of Precautions/Restrictions: Intact Restrictions Weight Bearing Restrictions Per Provider Order: No      Mobility  Bed Mobility               General bed mobility comments: OOB in recliner    Transfers Overall transfer level: Needs assistance Equipment used: Rolling walker (2 wheels) Transfers: Sit to/from Stand             General transfer comment: Cues for hand placement on RW to avoid it from sliding forward    Ambulation/Gait Ambulation/Gait assistance: Contact guard assist, Min assist Gait Distance (Feet): 150 Feet Assistive device: Rolling walker (2 wheels) Gait Pattern/deviations: Step-through pattern, Decreased stride length, Knee flexed in stance - left, Drifts right/left, Trunk flexed Gait velocity: fair/decr     General Gait Details: slight drift towards Lt, no overt LOB noted. pt slightly dizzy after stair mobility.  Stairs Stairs: Yes Stairs assistance: Contact guard assist Stair Management: Two rails, Step to pattern, Forwards Number of Stairs: 2 General stair comments: ascend with Rt, descend with Lt, no overt LOB  Wheelchair Mobility     Tilt Bed    Modified Rankin (Stroke Patients Only)       Balance Overall balance assessment: Mild deficits observed, not formally tested  Pertinent Vitals/Pain Pain Assessment Pain Assessment: No/denies pain    Home Living Family/patient expects to be discharged to:: Private residence Living Arrangements: Spouse/significant other Available Help at  Discharge: Family;Available PRN/intermittently Type of Home: House Home Access: Stairs to enter Entrance Stairs-Rails: Doctor, general practice of Steps: 4   Home Layout: One level Home Equipment: Rolling Walker (2 wheels);Grab bars - tub/shower;Grab bars - toilet;Shower seat;Cane - single point [name]) Additional Comments: Wife has dementia and Parkinson's, pt had become her caregiver in recent years    Prior Function Prior Level of Function : Independent/Modified Independent;Driving             Mobility Comments: mod i with SPC, caregiver for spouse ADLs Comments: Ind, drives and gets groceries. Does routine housework     Extremity/Trunk Assessment   Upper Extremity Assessment Upper Extremity Assessment: Defer to OT evaluation;Overall [facility] for tasks assessed    Lower Extremity Assessment Lower Extremity Assessment: Overall WFL for tasks assessed (ROM and strength WFL. Coordination with heel to shin slightly limited on Lt but possibly due to knee pain)    Cervical / Trunk Assessment Cervical / Trunk Assessment: Normal  Communication   Communication Communication: No apparent difficulties    Cognition Arousal: Alert Behavior During Therapy: WFL for tasks assessed/performed   PT - Cognitive impairments: No apparent impairments                         Following commands: Intact       Cueing Cueing Techniques: Verbal cues     General Comments General comments (skin integrity, edema, etc.): daughter, granddaugther, and spouse present    Exercises     Assessment/Plan    PT Assessment Patient needs continued PT services  PT Problem List Decreased strength;Decreased range of motion;Decreased activity tolerance;Decreased balance;Decreased mobility;Decreased knowledge of use of DME;Decreased knowledge of precautions;Cardiopulmonary status limiting activity;Decreased safety awareness;Decreased coordination;Obesity       PT Treatment  Interventions DME instruction;Gait training;Stair training;Functional mobility training;Therapeutic activities;Therapeutic exercise;Balance training;Neuromuscular re-education;Cognitive remediation;Patient/family education    PT Goals (Current goals can be found in the Care Plan section)  Acute Rehab PT Goals Patient Stated Goal: recover to assist spouse PT Goal Formulation: With patient/family Time For Goal Achievement: 08/26/23 Potential to Achieve Goals: Good    Frequency Min 2X/week     Co-evaluation               AM-PAC PT 6 Clicks Mobility  Outcome Measure Help needed turning from your back to your side while in a flat bed without using bedrails?: A Little Help needed moving from lying on your back to sitting on the side of a flat bed without using bedrails?: A Little Help needed moving to and from a bed to a chair (including a wheelchair)?: A Little Help needed standing up from a chair using your arms (e.g., wheelchair or bedside chair)?: A Little Help needed to walk in hospital room?: A Little Help needed climbing 3-5 steps with a railing? : A Little 6 Click Score: 18    End of Session Equipment Utilized During Treatment: Gait belt Activity Tolerance: Patient tolerated treatment well Patient left: in chair;with call bell/phone within reach;with family/visitor present Nurse Communication: Mobility status PT Visit Diagnosis: Unsteadiness on feet (R26.81);Other abnormalities of gait and mobility (R26.89);Muscle weakness (generalized) (M62.81);Difficulty in walking, not elsewhere classified (R26.2)    Time: 1329-1406 PT Time Calculation (min) (ACUTE ONLY): 37 min   Charges:   PT Evaluation $PT  Eval Moderate Complexity: 1 Mod PT Treatments $Gait Training: 8-22 mins PT General Charges $$ ACUTE PT VISIT: 1 Visit         Vernell DONEEN KLEIN, DPT Acute Rehabilitation Services Office [phone]  08/12/23 2:24 PM

## 2023-08-12 NOTE — Progress Notes (Addendum)
  Progress Note   Patient: Terry Lyons FMW:[id number] DOB: 08/26/1945 DOA: 08/11/2023     1 DOS: the patient was seen and examined on 08/12/2023 at 10:30AM      Brief hospital course: 78 y.o. M with MO, CKD, hypothyroidism, HTN and DM presented with acute vision changes to OSH.  MRI brain there showed scattered subcortical (embolic) infarcts in left frontal and lateral cortex, left BG and right cerebellum.  Transferred to [facility] for Neurology consultation.     Assessment and Plan: * Acute CVA (cerebrovascular accident) (HCC) MRI brain showed scattered subcortical infarcts in the left frontal, lateral cortex, left basal ganglia and right cerebellum.  Neurology also suspect an occult brainstem stroke given syndrome of dizziness, ataxia, and vision difficulties. - Non-invasive angiography showed vertebral artery stenoses without concomitant infarct -Overall suspect central embolic source. - Echocardiogram at outside facility showed no cardiogenic source - Carotid imaging unremarkable   - Lipids ordered: LDL 58 at OSH - Continue pravastatin  - A1c 5.9% at OSH - Aspirin  and Plavix 3 weeks then Plavix alone - Evaluation for arrhythmia/atrial fibrillation: none on monitoring so far, no prior history - Plan for ILR - PT eval ordered: recommend OP rehab - Nonsmoker    Type II diabetes mellitus (HCC)  Glucose controlled, A1c <6% - Hold home metformin  - Continue sliding scale corrections  Essential hypertension, benign Blood pressure 146 systolic - Permissive hypertension - Hold carvedilol   CKD stage 3a, GFR 45-59 ml/min (HCC) Creatinine stable relative to baseline  Hypothyroidism -Continue levothyroxine   Class 3 obesity BMI  40.4  Mood disorder - Continue quetiapine  and mirtazapine        Subjective: He continues to have some double vision with looking upward at the right, some nausea and feeling imbalance with eye movements, no fever, no focal weakness     Physical  Exam: BP (!) 146/77 (BP Location: Right Arm)   Pulse 83   Temp 98.6 F (37 C) (Oral)   Resp 18   SpO2 97%   Adult male, sitting up in recliner, interactive and appropriate RRR, no LE edema Respiratory rate normal Neuro grossly normal  Data Reviewed: Discussed with neurology Basic metabolic panel and CBC unremarkable   Family Communication: daughter at bedside    Disposition: Status is: Observation         Author: Lonni SHAUNNA Dalton, MD 08/12/2023 3:06 PM  For on call review [email or web address].

## 2023-08-12 NOTE — Hospital Course (Signed)
 78 y.o. M with MO, CKD, hypothyroidism, HTN and DM presented with acute vision changes to OSH.  MRI brain there showed scattered subcortical (embolic) infarcts in left frontal and lateral cortex, left BG and right cerebellum.  Transferred to [facility] for Neurology consultation.

## 2023-08-12 NOTE — Care Management Obs Status (Signed)
 MEDICARE OBSERVATION STATUS NOTIFICATION   Patient Details  Name: Terry Lyons MRN: [id number] Date of Birth: 04-23-1945   Medicare Observation Status Notification Given:  Yes    Ahyana Skillin G., RN 08/12/2023, 9:05 AM

## 2023-08-12 NOTE — Progress Notes (Signed)
 BLE venous exam completed. L, Estie Sproule RVT

## 2023-08-12 NOTE — Evaluation (Signed)
 Occupational Therapy Evaluation Patient Details Name: Terry Lyons MRN: [id number] DOB: 06-26-1945 Today's Date: 08/12/2023   History of Present Illness   Pt is an 78 yo male presenting to [facility] on 08/11/23 from [place] with blurred vision, balance difficulty, and vertigo. CT head was negative, MRI revealed small foci of acute infarction involving right frontal cortex, left frontal cortex and subcortical white matter, left basal ganglia, and right cerebellum.  PMH of GERD, gout, CKD III, basal cell carcinoma, HLD, HTN, obesity, Rosacea, DM, L TSA (2019).     Clinical Impressions Pt admitted for above, PTA pt reports being ind with ADLs/iADLs and caring for his wife. Pt currently presenting close to functional baseline, the only visual deficit noted was the start of diplopia with end R gaze, other than that his vision is functional. Pt completing ADLs with CGA to mod I, only needing CGA for safety during particular activities OOB that raise concerns for fall risk, pt feels he is back to baseline. OT to follow pt at low freq while in acute stay to promote OOB activity while in acute stay. No post acute OT recommended.      If plan is discharge home, recommend the following:   Assistance with cooking/housework     Functional Status Assessment   Patient has not had a recent decline in their functional status     Equipment Recommendations   None recommended by OT     Recommendations for Other Services         Precautions/Restrictions   Precautions Precautions: Fall Recall of Precautions/Restrictions: Intact Restrictions Weight Bearing Restrictions Per Provider Order: No     Mobility Bed Mobility Overal bed mobility: Modified Independent                  Transfers Overall transfer level: Needs assistance Equipment used: Rolling walker (2 wheels) Transfers: Sit to/from Stand Sit to Stand: Supervision           General transfer comment: Cues for hand placement  on RW to avoid it from sliding forward      Balance Overall balance assessment: Mild deficits observed, not formally tested                                         ADL either performed or assessed with clinical judgement   ADL Overall ADL's : Needs assistance/impaired Eating/Feeding: Independent;Sitting   Grooming: Standing;Supervision/safety   Upper Body Bathing: Supervision/ safety;Set up;Sitting Upper Body Bathing Details (indicate cue type and reason): bathing in shower Lower Body Bathing: Set up;Supervison/ safety;Sitting/lateral leans Lower Body Bathing Details (indicate cue type and reason): bathing in shower Upper Body Dressing : Modified independent;Sitting   Lower Body Dressing: Modified independent;Sitting/lateral leans;Sit to/from stand Lower Body Dressing Details (indicate cue type and reason): donning underwear and socks Toilet Transfer: Supervision/safety;Ambulation;Rolling walker (2 wheels) Toilet Transfer Details (indicate cue type and reason): supervision for safety Toileting- Clothing Manipulation and Hygiene: Modified independent;Sitting/lateral lean   Tub/ Shower Transfer: Contact guard assist;Grab bars Tub/Shower Transfer Details (indicate cue type and reason): CGA for safety to get into shower. Functional mobility during ADLs: Supervision/safety;Rolling walker (2 wheels) General ADL Comments: Negotiated 4 steps with CGA for safety     Vision Baseline Vision/History: 0 No visual deficits Ability to See in Adequate Light: 0 Adequate Patient Visual Report: Blurring of vision (initially blurry during confrontation testing, eyes needed to adjust for  a second he stated) Vision Assessment?: Yes Eye Alignment: Within Functional Limits Ocular Range of Motion: Within Functional Limits Alignment/Gaze Preference: Within Defined Limits Tracking/Visual Pursuits: Able to track stimulus in all quads without difficulty Saccades: Within functional  limits Convergence: Within functional limits Visual Fields: No apparent deficits Diplopia Assessment: Only with right gaze (Only with end R gaze, pt noted that pencil began to splint) Additional Comments: Pt reports having vision checked prior to coming to hospital. No particular deficits noted at that time. During OT confrontation testing pt not displaying any field deficit     Perception Perception: Within Functional Limits       Praxis Praxis: WFL       Pertinent Vitals/Pain Pain Assessment Pain Assessment: No/denies pain     Extremity/Trunk Assessment Upper Extremity Assessment Upper Extremity Assessment: Overall WFL for tasks assessed;LUE deficits/detail;Right hand dominant LUE Deficits / Details: Coordination seemingly with very mild deficit if any. ROM and strength WFL.   Lower Extremity Assessment Lower Extremity Assessment: Overall WFL for tasks assessed       Communication Communication Communication: No apparent difficulties   Cognition Arousal: Alert Behavior During Therapy: WFL for tasks assessed/performed Cognition: No apparent impairments                               Following commands: Intact       Cueing  General Comments   Cueing Techniques: Verbal cues  Pt daughter present and supportive. HR up to 105 bpm with activity.   Exercises     Shoulder Instructions      Home Living Family/patient expects to be discharged to:: Private residence Living Arrangements: Spouse/significant other Available Help at Discharge: Family;Available PRN/intermittently Type of Home: House Home Access: Stairs to enter [employer] of Steps: 4 Entrance Stairs-Rails: Right;Left Home Layout: One level     Bathroom Shower/Tub: Producer, television/film/video: Handicapped height     Home Equipment: Agricultural consultant (2 wheels);Grab bars - tub/shower;Grab bars - toilet;Shower seat;Cane - single point [name])   Additional Comments: Wife  has dementia and Parkinson's, pt had become her caregiver in recent years      Prior Functioning/Environment Prior Level of Function : Independent/Modified Independent;Driving             Mobility Comments: mod i with SPC, caregiver for spouse ADLs Comments: Ind, drives and gets groceries. Does routine housework    OT Problem List: Decreased safety awareness   OT Treatment/Interventions: Self-care/ADL training;Patient/family education;Therapeutic exercise;Therapeutic activities;DME and/or AE instruction      OT Goals(Current goals can be found in the care plan section)   Acute Rehab OT Goals Patient Stated Goal: To return home OT Goal Formulation: With patient Time For Goal Achievement: 08/26/23 Potential to Achieve Goals: Good ADL Goals Pt Will Perform Grooming: with modified independence;standing Pt Will Transfer to Toilet: with modified independence;bedside commode Pt Will Perform Tub/Shower Transfer: Shower transfer;ambulating;with modified independence   OT Frequency:  Min 1X/week    Co-evaluation              AM-PAC OT 6 Clicks Daily Activity     Outcome Measure Help from another person eating meals?: None Help from another person taking care of personal grooming?: A Little Help from another person toileting, which includes using toliet, bedpan, or urinal?: A Little Help from another person bathing (including washing, rinsing, drying)?: A Little Help from another person to put on and taking  off regular upper body clothing?: None Help from another person to put on and taking off regular lower body clothing?: None 6 Click Score: 21   End of Session Equipment Utilized During Treatment: Gait belt;Rolling walker (2 wheels) Nurse Communication: Mobility status  Activity Tolerance: Patient tolerated treatment well Patient left: in bed;with call bell/phone within reach;with family/visitor present  OT Visit Diagnosis: Low vision, both eyes (H54.2)                 Time: 9171-9076 OT Time Calculation (min): 55 min Charges:  OT General Charges $OT Visit: 1 Visit OT Evaluation $OT Eval Low Complexity: 1 Low OT Treatments $Self Care/Home Management : 23-37 mins $Therapeutic Activity: 8-22 mins  08/12/2023  AB, OTR/L  Acute Rehabilitation Services  Office: [phone]   Curtistine JONETTA Das 08/12/2023, 10:05 AM

## 2023-08-12 NOTE — Assessment & Plan Note (Signed)
 MRI brain showed  - Non-invasive angiography showed  - Echocardiogram showed no cardiogenic source of embolism - Carotid imaging unremarkable   - Lipids ordered: LDL 58 at OSH - A1c 5.9% at OSH - Aspirin   - Evaluation for arrhythmia/atrial fibrillation:  - tPA not given because  - Dysphagia screen ordered in ER - PT eval ordered:  - Nonsmoker

## 2023-08-13 ENCOUNTER — Encounter (HOSPITAL_COMMUNITY)
Admission: EM | Disposition: A | Discharge status: Home / Self Care | Payer: Self-pay | Source: Other Acute Inpatient Hospital | Attending: Family Medicine

## 2023-08-13 ENCOUNTER — Other Ambulatory Visit (HOSPITAL_COMMUNITY): Payer: Self-pay

## 2023-08-13 ENCOUNTER — Encounter (HOSPITAL_COMMUNITY): Payer: Self-pay | Admitting: Cardiology

## 2023-08-13 DIAGNOSIS — E785 Hyperlipidemia, unspecified: Secondary | ICD-10-CM | POA: Diagnosis not present

## 2023-08-13 DIAGNOSIS — I6340 Cerebral infarction due to embolism of unspecified cerebral artery: Secondary | ICD-10-CM

## 2023-08-13 DIAGNOSIS — I6501 Occlusion and stenosis of right vertebral artery: Secondary | ICD-10-CM

## 2023-08-13 DIAGNOSIS — E119 Type 2 diabetes mellitus without complications: Secondary | ICD-10-CM

## 2023-08-13 DIAGNOSIS — I639 Cerebral infarction, unspecified: Secondary | ICD-10-CM | POA: Diagnosis not present

## 2023-08-13 DIAGNOSIS — R42 Dizziness and giddiness: Secondary | ICD-10-CM | POA: Diagnosis not present

## 2023-08-13 HISTORY — PX: LOOP RECORDER INSERTION: EP1214

## 2023-08-13 LAB — GLUCOSE, CAPILLARY
Glucose-Capillary: 110 mg/dL — ABNORMAL HIGH (ref 70–99)
Glucose-Capillary: 120 mg/dL — ABNORMAL HIGH (ref 70–99)

## 2023-08-13 LAB — RPR: RPR Ser Ql: NONREACTIVE

## 2023-08-13 SURGERY — LOOP RECORDER INSERTION

## 2023-08-13 MED ORDER — LIDOCAINE-EPINEPHRINE 1 %-1:100000 IJ SOLN
INTRAMUSCULAR | Status: AC
  Filled 2023-08-13: qty 1

## 2023-08-13 MED ORDER — LIDOCAINE-EPINEPHRINE 1 %-1:100000 IJ SOLN
INTRAMUSCULAR | Status: DC | PRN
  Administered 2023-08-13: 20 mL

## 2023-08-13 MED ORDER — PANTOPRAZOLE SODIUM 20 MG PO TBEC
20.0000 mg | DELAYED_RELEASE_TABLET | Freq: Every day | ORAL | 0 refills | Status: DC
  Filled 2023-08-13: qty 30, 30d supply, fill #0

## 2023-08-13 MED ORDER — PANTOPRAZOLE SODIUM 20 MG PO TBEC: 20.0000 mg | DELAYED_RELEASE_TABLET | Freq: Every day | ORAL | 3 refills | Status: AC

## 2023-08-13 MED ORDER — ONDANSETRON HCL 4 MG PO TABS: 4.0000 mg | ORAL_TABLET | Freq: Three times a day (TID) | ORAL | 0 refills | Status: AC | PRN

## 2023-08-13 MED ORDER — CLOPIDOGREL BISULFATE 75 MG PO TABS
75.0000 mg | ORAL_TABLET | Freq: Every day | ORAL | 0 refills | Status: DC
  Filled 2023-08-13: qty 30, 30d supply, fill #0

## 2023-08-13 MED ORDER — CLOPIDOGREL BISULFATE 75 MG PO TABS: 75.0000 mg | ORAL_TABLET | Freq: Every day | ORAL | 3 refills | Status: AC

## 2023-08-13 SURGICAL SUPPLY — 2 items
MONITOR REVEAL LINQ II (Prosthesis & Implant Heart) IMPLANT
PACK LOOP INSERTION (CUSTOM PROCEDURE TRAY) ×2 IMPLANT

## 2023-08-13 NOTE — Progress Notes (Signed)
 6 STROKE TEAM PROGRESS NOTE   INTERIM HISTORY/SUBJECTIVE Patient is sitting up in bed comfortably.  No complaints. Lower extremity ultrasound shows no evidence of DVT and loop recorder requested. He does report some dizziness with head movements. No nystagmus on exam.  PT Vestibular eval ordered  OBJECTIVE  CBC    Component Value Date/Time   WBC 5.5 08/12/2023 0717   RBC 4.21 (L) 08/12/2023 0717   HGB 13.3 08/12/2023 0717   HGB 15.3 02/20/2020 1213   HCT 39.4 08/12/2023 0717   HCT 46.4 02/20/2020 1213   PLT 239 08/12/2023 0717   PLT 219 02/20/2020 1213   MCV 93.6 08/12/2023 0717   MCV 92 02/20/2020 1213   MCH 31.6 08/12/2023 0717   MCHC 33.8 08/12/2023 0717   RDW 14.6 08/12/2023 0717   RDW 13.9 02/20/2020 1213   LYMPHSABS 1.4 01/18/2021 0049   MONOABS 0.9 01/18/2021 0049   EOSABS 0.2 01/18/2021 0049   BASOSABS 0.0 01/18/2021 0049    BMET    Component Value Date/Time   NA 137 08/12/2023 0717   NA 140 02/20/2020 1213   K 3.7 08/12/2023 0717   CL 105 08/12/2023 0717   CO2 25 08/12/2023 0717   GLUCOSE 112 (H) 08/12/2023 0717   BUN 16 08/12/2023 0717   BUN 13 02/20/2020 1213   CREATININE 1.24 08/12/2023 0717   CALCIUM  8.5 (L) 08/12/2023 0717   GFRNONAA 59 (L) 08/12/2023 0717    IMAGING past 24 hours EP PPM/ICD IMPLANT Result Date: 08/13/2023 CONCLUSIONS:  1. Successful implantation of a implantable loop recorder for a history of cryptogenic stroke  2. No early apparent complications. [name], MD Cardiac Electrophysiology    Vitals:   08/13/23 0001 08/13/23 0350 08/13/23 0846 08/13/23 1220  BP: (!) 112/57 133/76 (!) 147/76 133/76  Pulse: 76 91 83 81  Resp: 18 18 18 18   Temp: 98 F (36.7 C) 98 F (36.7 C) 98.3 F (36.8 C) 97.7 F (36.5 C)  TempSrc:   Oral Oral  SpO2: 94% 98% 93% 98%     PHYSICAL EXAM General:  Alert, well-nourished, well-developed elderly Caucasian male in no acute distress Psych:  Mood and affect appropriate for situation CV:  Regular rate and rhythm on monitor Respiratory:  Regular, unlabored respirations on room air GI: Abdomen soft and nontender   NEURO:  Mental Status: AA&Ox3, patient is able to give clear and coherent history Speech/Language: speech is without dysarthria or aphasia.  Naming, repetition, fluency, and comprehension intact.  Cranial Nerves:  II: PERRL. Visual fields full.  III, IV, VI: EOMI. Eyelids elevate symmetrically.  V: Sensation is intact to light touch and symmetrical to face.  VII: Face is symmetrical resting and smiling VIII: hearing intact to voice. IX, X: Palate elevates symmetrically. Phonation is normal.  KP:Dynloizm shrug 5/5. XII: tongue is midline without fasciculations. Motor: 5/5 strength to all muscle groups tested.  Tone: is normal and bulk is normal Sensation- Intact to light touch bilaterally. Extinction absent to light touch to DSS.   Coordination: FTN intact bilaterally, HKS: no ataxia in BLE.No drift.  Gait- deferred   ASSESSMENT/PLAN  [name] is a 78 y.o. male with history of  type 2 diabetes, hypertension, hyperlipidemia, CKD stage IIIa, PTSD presenting with visual disturbance and reported feeling funny.    Acute Ischemic Infarct:  right frontal infarct and left frontal infarct left basal ganglia and right cerebellum  Etiology:  likely cardio embolic given multiple vascular territories involvement Code Stroke CT head  No acute abnormality. ASPECTS 10.    CTA head & neck no vascular occlusion but short segment of high-grade stenosis was noted at the origin of the right vertebral artery and V4 segment MRI  small foci of acute infarction involving right frontal cortex, left frontal cortex and subcortical white matter, left basal ganglia, and right cerebellum  2D Echo EF 60 to 65% with mild aortic stenosis, and mildly enlarged left atrium  LE Venous duplex -no DVT  LDL 58 HgbA1c 5.9 VTE prophylaxis - lovenox  aspirin  81 mg daily prior to admission,  now on aspirin  81 mg daily and clopidogrel 75 mg daily for  3 weeks and then plavix alone Disposition:  pending   Hypertension Home meds:  Coreg  Stable Blood Pressure Goal: BP less than 220/110   Hyperlipidemia Home meds:  pravastatin , resumed in hospital LDL 58, goal < 70 Continue statin at discharge  Diabetes type II Controlled Home meds:  metformin  HgbA1c 5.8, goal < 7.0 CBGs SSI Recommend close follow-up with PCP for better DM control  Other Active Problems Dizziness Vestibular PT ordered  Hospital day # 1   Patient presented with symptoms of dizziness, off-balance and vision difficulties suggesting brainstem ischemia but MRI does not show brainstem stroke but does show multiple ischemic strokes in different vascular territories raising concerns for central cardiac source of embolism.  CT angiogram shows high-grade proximal right vertebral artery origin and terminal right vertebral artery stenosis but no infarcts in this distribution.  Recommend dual antiplatelet therapy aspirin  Plavix for 3 weeks followed by Plavix alone and aggressive risk factor modification.  Patient will need prolonged cardiac monitoring at discharge and recommend loop recorder.  Discussed with Dr. Jonel and patient.  Discussed with EP team.  Greater than 50% time during this 35-minute visit was spent in counseling and coordination of care and discussion with patient and care team and answering questions.  Eather Popp, MD Medical Director [facility] Pager: [phone] 08/13/2023 2:43 PM   To contact Stroke Continuity provider, please refer to [email or web address]. After hours, contact General Neurology

## 2023-08-13 NOTE — Consult Note (Addendum)
 ELECTROPHYSIOLOGY CONSULT NOTE  Patient ID: Terry Lyons MRN: [id number], DOB/AGE: 10-06-1945   Admit date: 08/11/2023 Date of Consult: 08/13/2023  Primary Physician: [name], MD Primary Cardiologist: Dr. Alveta (2022)  Reason for Consultation: Cryptogenic stroke, recommendations regarding Implantable Loop Recorder, requested by Dr. Rosemarie  History of Present Illness Terry Lyons was admitted on 08/11/2023 with speech difficulties, off balance, dizzy > stroke.    PMHx includes:HTN, Coronary CCA revealed moderate nonobstructive CAD, HLD, CKD (IIIa), hypothyroidism  Neurology notes: right frontal infarct and left frontal infarct left basal ganglia and right cerebellum  Etiology:  likely cardio embolic given multiple vascular territories involvement.  he has undergone workup for stroke including echocardiogram and carotid dopplers.  The patient has been monitored on telemetry which has demonstrated sinus rhythm with no arrhythmias.  Inpatient stroke work-up is to be completed with a TEE.   Echocardiogram this admission ([facility]) on 08/11/23 demonstrated   Technically difficult 2D M-mode and color-flow Doppler echocardiogram  demonstrates overall adequate systolic left ventricular performance.  2.  The aortic valve is thickened and demonstrates reduced leaflet  mobility.  Aortic stenosis is present but appears to be mild with  estimated aortic valve area 1.5 cm.  No aortic insufficiency is noted  3.  There is mitral annular calcification with reduced anterior leaflet  mobility.  No mitral valve area was calculated due to the difficulty of  the study.  Mild mitral stenosis cannot be definitely excluded but if  present is likely not severe.  4.  Left atrium feels to be mildly enlarged, right atrium is normal  5.  Right ventricular structure and function is normal  6.  The pericardium is normal in thickness there is no effusion  1.  Left Ventricle  Left ventricle size is normal. Wall  thickness is normal. Systolic function is normal. EF: 60-65%.  Right Ventricle  Systolic function is normal. Left Atrium  Left atrium is mildly dilated. at 5.000 cm.  Right Atrium  Right atrium size is normal.  IVC/SVC  The inferior vena cava demonstrates a diameter of <=2.1 cm and collapses >50%; therefore, the right atrial pressure is estimated at 3 mmHg.  Mitral Valve  The leaflets are mildly thickened. There is anterior annular calcification. There is no mitral regurgitation. Mild mitral stenosis cannot be excluded  Tricuspid Valve  Tricuspid valve structure is normal. There is trace regurgitation.  Aortic Valve  The aortic valve is tricuspid. The leaflets are moderately thickened and exhibit moderately reduced excursion. There is mild stenosis, with peak and mean gradients of 32.000 and 17.000 mmHg.  Pulmonic Valve  The pulmonic valve was not well visualized.  Ascending Aorta  The aortic root is normal in size.  Pericardium  There is no pericardial effusion.    Lab work is reviewed.   Prior to admission, the patient denies chest pain, shortness of breath, dizziness, palpitations, or syncope, does say occasionally has a feeling in his chest, generally transient, his daughter mentions of late unusually tired.  They are recovering from their stroke with plans to home at discharge.   Past Medical History:  Diagnosis Date   Allergy    Arthritis    CHF (congestive heart failure) (HCC)    hospitalized in 2017; was diagnosed with an acute excacerbation.    CKD stage 3a, GFR 45-59 ml/min (HCC)    Colon polyps    adenomatous   Diabetes (HCC)    Diabetes mellitus    GERD (gastroesophageal reflux  disease)    Gout    History of basal cell carcinoma    W-S Derm   History of squamous cell carcinoma    Hyperlipidemia    Hypertension    Obesity    Rosacea    Tachycardia      Surgical History:  Past Surgical History:  Procedure Laterality Date   colonoscopy with  polypectomy     X3   ESOPHAGOGASTRODUODENOSCOPY N/A 03/10/2015   Procedure: ESOPHAGOGASTRODUODENOSCOPY (EGD);  Surgeon: [name], MD;  Location: [facility];  Service: Endoscopy;  Laterality: N/A;   esophagus dilated     X3   EUS N/A 05/20/2015   Procedure: UPPER ENDOSCOPIC ULTRASOUND (EUS) RADIAL;  Surgeon: Toribio SHAUNNA Cedar, MD;  Location: [facility];  Service: Endoscopy;  Laterality: N/A;   KNEE ARTHROSCOPY     bilaterally   PILONIDAL CYST EXCISION     REPLACEMENT TOTAL KNEE Right    R   SHOULDER SURGERY     right shoulder   TONSILLECTOMY     total ethmoidectomy & sphenoidectomiy Bilateral 05/27/13   also reduction of turbinates   TOTAL KNEE ARTHROPLASTY     RIGHT   TOTAL SHOULDER ARTHROPLASTY Left 10/04/2017   Procedure: LEFT TOTAL SHOULDER ARTHROPLASTY;  Surgeon: [name], MD;  Location: [facility];  Service: Orthopedics;  Laterality: Left;     Medications Prior to Admission  Medication Sig Dispense Refill Last Dose/Taking   Acetaminophen  500 MG coapsule Take 1 capsule by mouth 4 (four) times daily.   08/11/2023   allopurinol  (ZYLOPRIM ) 300 MG tablet Take 300 mg by mouth daily. (Patient taking differently: Take 300 mg by mouth daily.)   08/11/2023   aspirin  EC 81 MG tablet Take 81 mg by mouth daily.   08/11/2023   carvedilol  (COREG ) 25 MG tablet Take 1 tablet (25 mg total) by mouth 2 (two) times daily with a meal. 180 tablet 3 08/11/2023   cetirizine  (ZYRTEC ) 10 MG tablet Take 10 mg by mouth daily.   08/11/2023   famotidine  (PEPCID ) 20 MG tablet Take 40 mg by mouth at bedtime.   08/10/2023   fenofibrate  160 MG tablet Take 1 tablet (160 mg total) by mouth daily. 90 tablet 3 08/11/2023   gabapentin  (NEURONTIN ) 300 MG capsule Take 300 mg by mouth 2 (two) times daily as needed (neuropathic pain).   08/11/2023   levothyroxine  (SYNTHROID ) 50 MCG tablet Take 50-100 mcg by mouth daily before breakfast. Takes 100 mcg on Sunday, 50 mcg all other days   08/11/2023   melatonin 3 MG TABS tablet  Take 3 mg by mouth at bedtime.   08/10/2023   metFORMIN  (GLUCOPHAGE ) 500 MG tablet Take 500 mg by mouth 2 (two) times daily with a meal.   08/11/2023   mirtazapine  (REMERON ) 30 MG tablet Take 15 mg by mouth at bedtime.   08/10/2023   omeprazole  (PRILOSEC) 20 MG capsule Take 40 mg by mouth 2 (two) times daily.    08/11/2023   potassium chloride  (K-DUR) 10 MEQ tablet Take 10 mEq by mouth daily.   08/11/2023   pravastatin  (PRAVACHOL ) 20 MG tablet Take 20 mg by mouth at bedtime.   08/10/2023   QUEtiapine  (SEROQUEL ) 25 MG tablet Take 12.5 mg by mouth at bedtime.   08/10/2023   tamsulosin  (FLOMAX ) 0.4 MG CAPS capsule Take 1 capsule (0.4 mg total) by mouth daily after supper. 30 capsule 0 08/11/2023   traZODone (DESYREL) 100 MG tablet Take 100 mg by mouth at bedtime.   08/10/2023  Menthol -Methyl Salicylate (THERA-GESIC EX) Apply 1 application topically 4 (four) times daily as needed (knee pain.).    Unknown    Inpatient Medications:   aspirin  EC  81 mg Oral Daily   clopidogrel  75 mg Oral Daily   diclofenac Sodium  2 g Topical QID   enoxaparin  (LOVENOX ) injection  40 mg Subcutaneous Q24H   famotidine   20 mg Oral QHS   insulin  aspart  0-5 Units Subcutaneous QHS   insulin  aspart  0-6 Units Subcutaneous TID WC   levothyroxine   50 mcg Oral QAC breakfast   melatonin  3 mg Oral QHS   mirtazapine   15 mg Oral QHS   pantoprazole   20 mg Oral BID   polyethylene glycol  17 g Oral Daily   pravastatin   20 mg Oral QPM   QUEtiapine   12.5 mg Oral QHS   tamsulosin   0.4 mg Oral QPC supper   traZODone  100 mg Oral QHS    Allergies:  Allergies  Allergen Reactions   Sulfa Antibiotics     UNSPECIFIED REACTION    Bupropion Hcl Other (See Comments)    REACTION: insomnia   Codeine Nausea Only   Escitalopram Oxalate Other (See Comments)    REACTION: insomnia   Sertraline Hcl Other (See Comments)    REACTION: insomnia    Social History   Socioeconomic History   Marital status: Married    Spouse name: Not on  file   Number of children: 2   Years of education: Not on file   Highest education level: Not on file  Occupational History   Occupation: retired  Tobacco Use   Smoking status: Former    Current packs/day: 0.00    Types: Cigarettes    Quit date: 10/17/1973    Years since quitting: 49.8   Smokeless tobacco: Never  Vaping Use   Vaping status: Never Used  Substance and Sexual Activity   Alcohol  use: No    Alcohol /week: 0.0 standard drinks of alcohol    Drug use: No   Sexual activity: Not on file  Other Topics Concern   Not on file  Social History Narrative   Pt's states no medical or surgical changes since previsit or office visit.   Social Drivers of Corporate investment banker Strain: Not on file  Food Insecurity: No Food Insecurity (08/11/2023)   Hunger Vital Sign    Worried About Running Out of Food in the Last Year: Never true    Ran Out of Food in the Last Year: Never true  Transportation Needs: No Transportation Needs (08/11/2023)   PRAPARE - Administrator, Civil Service (Medical): No    Lack of Transportation (Non-Medical): No  Physical Activity: Not on file  Stress: Not on file  Social Connections: Unknown (06/26/2021)   Received from [facility]   Social Network    Social Network: Not on file  Intimate Partner Violence: Not At Risk (08/11/2023)   Humiliation, Afraid, Rape, and Kick questionnaire    Fear of Current or Ex-Partner: No    Emotionally Abused: No    Physically Abused: No    Sexually Abused: No     Family History  Problem Relation Age of Onset   Lung cancer Mother        smoker   Heart disease Father        Rheumatic Heart Disease   Heart attack Brother 48       CBAG   Heart attack Paternal Grandmother 50  Heart attack Paternal Grandfather 21   Lung cancer Maternal Aunt        smoker   Liver disease Neg Hx    Esophageal cancer Neg Hx    Colon cancer Neg Hx       Review of Systems: All other systems reviewed and are  otherwise negative except as noted above.  Physical Exam: Vitals:   08/12/23 1958 08/13/23 0001 08/13/23 0350 08/13/23 0846  BP: 108/60 (!) 112/57 133/76 (!) 147/76  Pulse: 76 76 91 83  Resp: 18 18 18 18   Temp: 98 F (36.7 C) 98 F (36.7 C) 98 F (36.7 C) 98.3 F (36.8 C)  TempSrc: Oral   Oral  SpO2: 98% 94% 98% 93%    GEN- The patient is well appearing, alert and oriented x 3 today.   Head- normocephalic, atraumatic Eyes-  Sclera clear, conjunctiva pink Ears- hearing intact Oropharynx- clear Neck- supple Lungs- CTA b/l, normal work of breathing Heart- RRR, no murmurs, rubs or gallops  GI- soft, NT, ND Extremities- no clubbing, cyanosis, or edema MS- no significant deformity or atrophy Skin- no rash or lesion Psych- euthymic mood, full affect   Labs:   Lab Results  Component Value Date   WBC 5.5 08/12/2023   HGB 13.3 08/12/2023   HCT 39.4 08/12/2023   MCV 93.6 08/12/2023   PLT 239 08/12/2023    Recent Labs  Lab 08/12/23 0717  NA 137  K 3.7  CL 105  CO2 25  BUN 16  CREATININE 1.24  CALCIUM  8.5*  PROT 6.3*  BILITOT 1.1  ALKPHOS 45  ALT 16  AST 21  GLUCOSE 112*   Lab Results  Component Value Date   CKTOTAL 73 06/10/2008   CKMB 0.5 06/10/2008   TROPONINI 0.01        NO INDICATION OF MYOCARDIAL INJURY. 06/10/2008   Lab Results  Component Value Date   CHOL 133 02/20/2020   CHOL 188 12/21/2010   Lab Results  Component Value Date   HDL 47 02/20/2020   HDL 45.30 12/21/2010   Lab Results  Component Value Date   LDLCALC 46 02/20/2020   Lab Results  Component Value Date   TRIG 260 (H) 02/20/2020   TRIG 252.0 (H) 12/21/2010   Lab Results  Component Value Date   CHOLHDL 2.8 02/20/2020   CHOLHDL 4 12/21/2010   Lab Results  Component Value Date   LDLDIRECT 104.7 12/21/2010    Lab Results  Component Value Date   DDIMER (H) 06/09/2008    1.34        AT THE INHOUSE ESTABLISHED CUTOFF VALUE OF 0.48 ug/mL FEU, THIS ASSAY HAS BEEN  DOCUMENTED IN THE LITERATURE TO HAVE A SENSITIVITY AND NEGATIVE PREDICTIVE VALUE OF AT LEAST 98 TO 99%.  THE TEST RESULT SHOULD BE CORRELATED WITH AN ASSESSMENT OF THE CLINICAL PROBABILITY OF DVT / VTE.     Radiology/Studies:  VAS US  LOWER EXTREMITY VENOUS (DVT) Result Date: 08/12/2023  Lower Venous DVT Study Patient Name:  Terry Lyons  Date of Exam:   08/12/2023 Medical Rec #: [id number]   Accession #:    [id number] Date of Birth: [birth year]    Patient Gender: M Patient Age:   59 years Exam Location:  [facility] Procedure:      VAS US  LOWER EXTREMITY VENOUS (DVT) Referring Phys: LOLA JERNIGAN --------------------------------------------------------------------------------  Indications: Edema.  Performing Technologist: Elmarie Lindau, RVT  Examination Guidelines: A complete evaluation includes B-mode imaging, spectral Doppler, color Doppler,  and power Doppler as needed of all accessible portions of each vessel. Bilateral testing is considered an integral part of a complete examination. Limited examinations for reoccurring indications may be performed as noted. The reflux portion of the exam is performed with the patient in reverse Trendelenburg.   Summary: BILATERAL: - No evidence of deep vein thrombosis seen in the lower extremities, bilaterally. -No evidence of popliteal cyst, bilaterally.   *See table(s) above for measurements and observations. Electronically signed by Norman Serve on 08/12/2023 at 2:00:28 PM.    Final     12-lead ECG  All prior EKG's in EPIC reviewed with no documented atrial fibrillation  Telemetry SR  Assessment and Plan:  1. Cryptogenic stroke The patient presents with cryptogenic stroke.  Neurology has deferred TEE, neg LE venous study b/l.  I spoke at length with the patient about monitoring for afib with either a 30 day event monitor or an implantable loop recorder.  Risks, benefits, and alteratives to implantable loop recorder were discussed with the patient  today.   At this time, the patient is very clear in their decision to proceed with implantable loop recorder.   Wound care was reviewed with the patient (keep incision clean and dry for 3 days).  Wound check follow up will be scheduled for the patient.  Please call with questions.   Renee Macario Arthur, PA-C 08/13/2023  I have seen, examined the patient, and reviewed the above assessment and plan.    HPI: [name] is an 78 year old male with past medical history notable for hypertension, hyperlipidemia, type 2 diabetes, PTSD, insomnia, hypothyroidism, CKD stage III who presented to ED on 08/10/2023 with vision changes and imbalance.  He was found to have multiple embolic supratentorial infarcts on MRI.  This was felt to be concerning for cardioembolic etiology.  Electrophysiology has been consulted for loop recorder implant.  Today, patient reports feeling relatively well.  He has no new or acute complaints.  General: Well developed, in no acute distress.  Neck: No JVD.  Cardiac: Normal rate, regular rhythm.  Resp: Normal work of breathing.  Ext: No edema.   Psych: Normal affect.   Assessment and Plan:  #.  Cryptogenic stroke: Infarcts in multiple territories is concerning for cardioembolic etiology.  Patient has no known history of atrial fibrillation.  Discussed role of prolonged monitoring for atrial fibrillation in efforts to identify etiology for his strokes.  Patient and daughter voiced understanding.  Discussed role of loop recorder for extended monitoring.  Again, patient and daughter voiced understanding. -Explained risks, benefits, and alternatives to ICD implantation, including but not limited to bleeding, infection, damage to nearby structures.  Pt verbalized understanding and wants to proceed prior to discharge.  Tentatively plan for loop recorder implant this afternoon.   [name], MD 08/13/2023 10:43 AM

## 2023-08-13 NOTE — TOC Transition Note (Signed)
 Transition of Care [facility]) - Discharge Note   Patient Details  Name: Terry Lyons MRN: [id number] Date of Birth: 04/09/1945  Transition of Care [facility]) CM/SW Contact:  Waddell Barnie Rama, RN Phone Number: 08/13/2023, 3:07 PM   Clinical Narrative:    For dc today, he is set up with outpatient physical therapy and speech therapy.  Also he is a [place] patient, and they put in a consult for the home maker program for patient, they will either contact the family member or the hospital for the assessment per the [facility] [phone] ext [phone].         Patient Goals and CMS Choice            Discharge Placement                       Discharge Plan and Services Additional resources added to the After Visit Summary for                                       Social Drivers of Health (SDOH) Interventions SDOH Screenings   Food Insecurity: No Food Insecurity (08/11/2023)  Housing: Low Risk  (08/11/2023)  Transportation Needs: No Transportation Needs (08/11/2023)  Utilities: Not At Risk (08/11/2023)  Depression (PHQ2-9): Low Risk  (05/31/2018)  Social Connections: Unknown (06/26/2021)   Received from [facility]  Tobacco Use: Medium Risk (08/11/2023)     Readmission Risk Interventions    08/13/2023   10:41 AM  Readmission Risk Prevention Plan  Transportation Screening Complete  PCP or Specialist Appt within 5-7 Days Complete  Home Care Screening Complete  Medication Review (RN CM) Complete

## 2023-08-13 NOTE — Evaluation (Signed)
 Clinical/Bedside Swallow Evaluation Patient Details  Name: Terry Lyons MRN: [id number] Date of Birth: [birth year]  Today's Date: 08/13/2023 Time: SLP Start Time (ACUTE ONLY): 1110 SLP Stop Time (ACUTE ONLY): 1130 SLP Time Calculation (min) (ACUTE ONLY): 20 min  Past Medical History:  Past Medical History:  Diagnosis Date   Allergy    Arthritis    CHF (congestive heart failure) (HCC)    hospitalized in 2017; was diagnosed with an acute excacerbation.    CKD stage 3a, GFR 45-59 ml/min (HCC)    Colon polyps    adenomatous   Diabetes (HCC)    Diabetes mellitus    GERD (gastroesophageal reflux disease)    Gout    History of basal cell carcinoma    W-S Derm   History of squamous cell carcinoma    Hyperlipidemia    Hypertension    Obesity    Rosacea    Tachycardia    Past Surgical History:  Past Surgical History:  Procedure Laterality Date   colonoscopy with polypectomy     X3   ESOPHAGOGASTRODUODENOSCOPY N/A 03/10/2015   Procedure: ESOPHAGOGASTRODUODENOSCOPY (EGD);  Surgeon: [name], MD;  Location: [facility];  Service: Endoscopy;  Laterality: N/A;   esophagus dilated     X3   EUS N/A 05/20/2015   Procedure: UPPER ENDOSCOPIC ULTRASOUND (EUS) RADIAL;  Surgeon: Toribio SHAUNNA Cedar, MD;  Location: [facility];  Service: Endoscopy;  Laterality: N/A;   KNEE ARTHROSCOPY     bilaterally   PILONIDAL CYST EXCISION     REPLACEMENT TOTAL KNEE Right    R   SHOULDER SURGERY     right shoulder   TONSILLECTOMY     total ethmoidectomy & sphenoidectomiy Bilateral 05/27/13   also reduction of turbinates   TOTAL KNEE ARTHROPLASTY     RIGHT   TOTAL SHOULDER ARTHROPLASTY Left 10/04/2017   Procedure: LEFT TOTAL SHOULDER ARTHROPLASTY;  Surgeon: [name], MD;  Location: [facility];  Service: Orthopedics;  Laterality: Left;   HPI:  78yo male [place]) admitted 08/11/23 with double vision, balance difficulty, vertigo. PMH: CHF, HTN, HLD, DM2, GERD, PTSD, insomnia, hypothyroidism, CKD3a. MRI -  acute infarct right frontal cortex, left frontal cortex, subcortical white matter, left basal ganglia, right cerebellum.    Assessment / Plan / Recommendation  Clinical Impression  Pt seen at bedside for swallow evaluation per daughter's request. RN reports no difficulty swallowing has been observed. CN exam unremarkable. Volitional cough strong. Pt tolerated trials of thin liquid via cup and straw, puree, and solid textures without obvious oral difficulty or overt s/s aspiration. Pt passed 3oz water challenge without difficulty. Recommend continuing with current (regular/thin) diet. No further ST intervention for dysphagia at this time. Please reconsult if needs arise. Medical team informed.  SLP Visit Diagnosis: Dysphagia, unspecified (R13.10)    Aspiration Risk  Mild aspiration risk    Diet Recommendation Regular;Thin liquid    Liquid Administration via: Cup;Straw Medication Administration: Whole meds with liquid Supervision: Patient able to self feed Compensations: Minimize environmental distractions;Slow rate;Small sips/bites Postural Changes: Seated upright at 90 degrees;Remain upright for at least 30 minutes after po intake    Other  Recommendations Oral Care Recommendations: Oral care BID     Assistance Recommended at Discharge Intermittent Supervision/Assistance  Functional Status Assessment Patient has not had a recent decline in their functional status (re: swallow function/safety)      Prognosis Prognosis for improved oropharyngeal function: Good      Swallow Study   General Date of  Onset: 08/11/23 HPI: 78yo male [place]) admitted 08/11/23 with double vision, balance difficulty, vertigo. PMH: CHF, HTN, HLD, DM2, GERD, PTSD, insomnia, hypothyroidism, CKD3a. MRI - acute infarct right frontal cortex, left frontal cortex, subcortical white matter, left basal ganglia, right cerebellum. Type of Study: Bedside Swallow Evaluation Previous Swallow Assessment: none Diet Prior to  this Study: Regular;Thin liquids (Level 0) Temperature Spikes Noted: No Respiratory Status: Room air History of Recent Intubation: No Behavior/Cognition: Alert;Cooperative;Pleasant mood Oral Cavity Assessment: Within Functional Limits Oral Care Completed by SLP: No Oral Cavity - Dentition: Missing dentition Vision: Functional for self-feeding Self-Feeding Abilities: Able to feed self Patient Positioning: Upright in chair Baseline Vocal Quality: Normal Volitional Cough: Strong Volitional Swallow: Able to elicit    Oral/Motor/Sensory Function Overall Oral Motor/Sensory Function: Within functional limits   Ice Chips Ice chips: Not tested   Thin Liquid Thin Liquid: Within functional limits Presentation: Cup;Straw Other Comments: pt passed 3oz water challenge without overt s/s aspiration    Nectar Thick Nectar Thick Liquid: Not tested   Honey Thick Honey Thick Liquid: Not tested   Puree Puree: Within functional limits Presentation: Self Fed;Spoon   Solid     Solid: Within functional limits Presentation: Self Fed     French Kendra B. Dory, MSP, CCC-SLP Speech Language Pathologist Office: [phone]  Dory Caprice Daring 08/13/2023,12:57 PM

## 2023-08-13 NOTE — Interval H&P Note (Signed)
 History and Physical Interval Note:  08/13/2023 10:48 AM  Terry Lyons  has presented today for surgery, with the diagnosis of stroke.  The various methods of treatment have been discussed with the patient and family. After consideration of risks, benefits and other options for treatment, the patient has consented to  Procedure(s): LOOP RECORDER INSERTION (N/A) as a surgical intervention.  The patient's history has been reviewed, patient examined, no change in status, stable for surgery.  I have reviewed the patient's chart and labs.  Questions were answered to the patient's satisfaction.     [name]

## 2023-08-13 NOTE — Discharge Summary (Signed)
 Physician Discharge Summary   Patient: Terry Lyons MRN: [id number] DOB: May 21, 1945  Admit date:     08/11/2023  Discharge date: 08/13/23  Discharge Physician: Lonni SHAUNNA Dalton   PCP: [name], MD     Recommendations at discharge:  Follow up with PCP at the [facility] in 1 week for stroke Follow up with [facility] for new stroke     Discharge Diagnoses: Principal Problem:   Acute CVA (cerebrovascular accident) [facility]) Active Problems:   Essential hypertension, benign   Type II diabetes mellitus (HCC)   Hypothyroidism   CKD stage 3a, GFR 45-59 ml/min (HCC)   Class 3 obesity     Hospital Course: 78 y.o. M with MO, CKD, hypothyroidism, HTN and DM presented with acute vision changes to OSH.  MRI brain there showed scattered subcortical (embolic) infarcts in left frontal and lateral cortex, left BG and right cerebellum.  Transferred to [facility] for Neurology consultation.     Acute CVA (cerebrovascular accident) (HCC) MRI brain showed scattered subcortical infarcts in the left frontal, lateral cortex, left basal ganglia and right cerebellum.    Non-invasive angiography showed vertebral artery stenoses without concomitant infarct.  Echocardiogram at OSH showed normal EF, no cardiogenic source.  ILR placed  Discharged on pravastatin   DAPT for 3 weeks then Plavix alone.  Referrals for outpatient PT and SLP sent.     Type II diabetes mellitus (HCC)  Glucose controlled, A1c <6% on home metformin    Essential hypertension, benign Controlled on carvedilol    CKD stage 3a, GFR 45-59 ml/min (HCC) Creatinine stable relative to baseline   Hypothyroidism Stable on levothyroxine                 The [place]  Controlled Substances Registry was reviewed for this patient prior to discharge.   Consultants: Neurology Electrophysiology Procedures performed: Loop recorder placement   Disposition: Home Diet recommendation:  Discharge Diet Orders (From  admission, onward)     Start     Ordered   08/13/23 0000  Diet - low sodium heart healthy        08/13/23 1442             DISCHARGE MEDICATION: Allergies as of 08/13/2023       Reactions   Sulfa Antibiotics    UNSPECIFIED REACTION    Bupropion Hcl Other (See Comments)   REACTION: insomnia   Codeine Nausea Only   Escitalopram Oxalate Other (See Comments)   REACTION: insomnia   Sertraline Hcl Other (See Comments)   REACTION: insomnia        Medication List     STOP taking these medications    omeprazole  20 MG capsule Commonly known as: PRILOSEC Replaced by: pantoprazole  20 MG tablet       TAKE these medications    Acetaminophen  500 MG capsule Take 1 capsule by mouth 4 (four) times daily.   allopurinol  300 MG tablet Commonly known as: ZYLOPRIM  Take 300 mg by mouth daily.   aspirin  EC 81 MG tablet Take 81 mg by mouth daily.   carvedilol  25 MG tablet Commonly known as: COREG  Take 1 tablet (25 mg total) by mouth 2 (two) times daily with a meal.   cetirizine  10 MG tablet Commonly known as: ZYRTEC  Take 10 mg by mouth daily.   clopidogrel 75 MG tablet Commonly known as: PLAVIX Take 1 tablet (75 mg total) by mouth daily. Start taking on: August 14, 2023   famotidine  20 MG tablet Commonly known as: PEPCID  Take  40 mg by mouth at bedtime.   fenofibrate  160 MG tablet Take 1 tablet (160 mg total) by mouth daily.   gabapentin  300 MG capsule Commonly known as: NEURONTIN  Take 300 mg by mouth 2 (two) times daily as needed (neuropathic pain).   levothyroxine  50 MCG tablet Commonly known as: SYNTHROID  Take 50-100 mcg by mouth daily before breakfast. Takes 100 mcg on Sunday, 50 mcg all other days   melatonin 3 MG Tabs tablet Take 3 mg by mouth at bedtime.   metFORMIN  500 MG tablet Commonly known as: GLUCOPHAGE  Take 500 mg by mouth 2 (two) times daily with a meal.   mirtazapine  30 MG tablet Commonly known as: REMERON  Take 15 mg by mouth at bedtime.    ondansetron  4 MG tablet Commonly known as: ZOFRAN  Take 1 tablet (4 mg total) by mouth every 8 (eight) hours as needed for nausea or vomiting.   pantoprazole  20 MG tablet Commonly known as: PROTONIX  Take 1 tablet (20 mg total) by mouth daily. Replaces: omeprazole  20 MG capsule   potassium chloride  10 MEQ tablet Commonly known as: KLOR-CON  Take 10 mEq by mouth daily.   pravastatin  20 MG tablet Commonly known as: PRAVACHOL  Take 20 mg by mouth at bedtime.   QUEtiapine  25 MG tablet Commonly known as: SEROQUEL  Take 12.5 mg by mouth at bedtime.   tamsulosin  0.4 MG Caps capsule Commonly known as: FLOMAX  Take 1 capsule (0.4 mg total) by mouth daily after supper.   THERA-GESIC EX Apply 1 application topically 4 (four) times daily as needed (knee pain.).   traZODone 100 MG tablet Commonly known as: DESYREL Take 100 mg by mouth at bedtime.        Follow-up Information     [facility] Follow up.   Specialty: Rehabilitation Why: They will call you to set up apt time, if you do not hear from them in three business days , please give them a call. thank you Contact information: [address] [place]  [address] [phone]        [name], MD Follow up.   Specialty: Internal Medicine Why: Doctors office will call you to set up hospital follow up Contact information: [address] [place] [address] [phone]         [facility] Follow up.   Contact information: [address]     [address] [place]  [address] [phone]                Discharge Instructions     Ambulatory referral to Neurology   Complete by: As directed    An appointment is requested in approximately: 8 weeks Stroke   Ambulatory referral to Physical Therapy   Complete by: As directed    Stroke Vestibular dysfunction   Diet - low sodium heart healthy   Complete by: As directed     Discharge instructions   Complete by: As directed    **IMPORTANT DISCHARGE INSTRUCTIONS**   From Dr. Jonel: You were admitted for strokes  You had an MRI at the [facility] med center that showed several small acute infarcts in the right frontal cortex, the left frontal cortex and subcortical white matter, the left basal ganglia in the right cerebellum. This pattern suggests embolism.  Your echocardiogram (ultrasound of the heart) and CT angiogram done there did not reveal the cause.  In case the cause was silent atrial fibrillation, we recommended the loop recorder.  Also, ADD the medicine clopidogrel/Plavix 75  mg daily to your home aspirin  81 mg daily Plavix is like a super-aspirin  Continue these together for 3 weeks, then stop aspirin  and continue Plavix in it's place indefinitely  We also recommend continuing your pravastatin , continued careful control of your blood sugars and blood pressure.    IMPORTANT: omeprazole /prilosec and Plavix interact, so shouldn't be taken together  Stop omeprazole  and replace it with pantoprazole   Go see Dr. Shayne in 1 week for follow up Get refills of the Plavix and pantoprazole   Go see Dr. Rosemarie at [facility] in 6-8 weeks for stroke follow up   Increase activity slowly   Complete by: As directed        Discharge Exam: There were no vitals filed for this visit.  General: Pt is alert, awake, not in acute distress Cardiovascular: RRR, nl S1-S2, no murmurs appreciated.   No LE edema.   Respiratory: Normal respiratory rate and rhythm.  CTAB without rales or wheezes. Abdominal: Abdomen soft and non-tender.  No distension or HSM.   Neuro/Psych: Strength symmetric in upper and lower extremities.  Judgment and insight appear normal.   Condition at discharge: good  The results of significant diagnostics from this hospitalization (including imaging, microbiology, ancillary and laboratory) are listed below for  reference.   Imaging Studies: EP PPM/ICD IMPLANT Result Date: 08/13/2023 CONCLUSIONS:  1. Successful implantation of a implantable loop recorder for a history of cryptogenic stroke  2. No early apparent complications. [name], MD Cardiac Electrophysiology   VAS US  LOWER EXTREMITY VENOUS (DVT) Result Date: 08/12/2023  Lower Venous DVT Study Patient Name:  MATAEO INGWERSEN  Date of Exam:   08/12/2023 Medical Rec #: [id number]   Accession #:    [id number] Date of Birth: 02-03-1946    Patient Gender: M Patient Age:   40 years Exam Location:  [facility] Procedure:      VAS US  LOWER EXTREMITY VENOUS (DVT) Referring Phys: LOLA JERNIGAN --------------------------------------------------------------------------------  Indications: Edema.  Performing Technologist: Elmarie Lindau, RVT  Examination Guidelines: A complete evaluation includes B-mode imaging, spectral Doppler, color Doppler, and power Doppler as needed of all accessible portions of each vessel. Bilateral testing is considered an integral part of a complete examination. Limited examinations for reoccurring indications may be performed as noted. The reflux portion of the exam is performed with the patient in reverse Trendelenburg.  +---------+---------------+---------+-----------+----------+--------------+ RIGHT    CompressibilityPhasicitySpontaneityPropertiesThrombus Aging +---------+---------------+---------+-----------+----------+--------------+ CFV      Full           Yes      Yes                                 +---------+---------------+---------+-----------+----------+--------------+ SFJ      Full                                                        +---------+---------------+---------+-----------+----------+--------------+ FV Prox  Full                                                        +---------+---------------+---------+-----------+----------+--------------+ FV Mid   Full                                                         +---------+---------------+---------+-----------+----------+--------------+  FV DistalFull                                                        +---------+---------------+---------+-----------+----------+--------------+ PFV      Full                                                        +---------+---------------+---------+-----------+----------+--------------+ POP      Full           Yes      Yes                                 +---------+---------------+---------+-----------+----------+--------------+ PTV      Full                                                        +---------+---------------+---------+-----------+----------+--------------+ PERO     Full                                                        +---------+---------------+---------+-----------+----------+--------------+   +---------+---------------+---------+-----------+----------+--------------+ LEFT     CompressibilityPhasicitySpontaneityPropertiesThrombus Aging +---------+---------------+---------+-----------+----------+--------------+ CFV      Full           Yes      Yes                                 +---------+---------------+---------+-----------+----------+--------------+ SFJ      Full                                                        +---------+---------------+---------+-----------+----------+--------------+ FV Prox  Full                                                        +---------+---------------+---------+-----------+----------+--------------+ FV Mid   Full                                                        +---------+---------------+---------+-----------+----------+--------------+ FV DistalFull                                                        +---------+---------------+---------+-----------+----------+--------------+  PFV      Full                                                         +---------+---------------+---------+-----------+----------+--------------+ POP      Full           Yes      Yes                                 +---------+---------------+---------+-----------+----------+--------------+ PTV      Full                                                        +---------+---------------+---------+-----------+----------+--------------+ PERO     Full                                                        +---------+---------------+---------+-----------+----------+--------------+     Summary: BILATERAL: - No evidence of deep vein thrombosis seen in the lower extremities, bilaterally. -No evidence of popliteal cyst, bilaterally.   *See table(s) above for measurements and observations. Electronically signed by Norman Serve on 08/12/2023 at 2:00:28 PM.    Final     Microbiology: Results for orders placed or performed during the hospital encounter of 01/16/21  Culture, blood (Routine x 2)     Status: None   Collection Time: 01/16/21 10:55 AM   Specimen: BLOOD  Result Value Ref Range Status   Specimen Description BLOOD RIGHT ANTECUBITAL  Final   Special Requests   Final    BOTTLES DRAWN AEROBIC AND ANAEROBIC Blood Culture adequate volume   Culture   Final    NO GROWTH 5 DAYS Performed at [facility], [address] [place], [place] [address]    Report Status 01/21/2021 FINAL  Final  Culture, blood (Routine x 2)     Status: None   Collection Time: 01/16/21 11:00 AM   Specimen: BLOOD  Result Value Ref Range Status   Specimen Description BLOOD LEFT ANTECUBITAL  Final   Special Requests   Final    BOTTLES DRAWN AEROBIC AND ANAEROBIC Blood Culture adequate volume   Culture   Final    NO GROWTH 5 DAYS Performed at [facility], [address], [place], [place] [address]    Report Status 01/21/2021 FINAL  Final  Urine Culture     Status: Abnormal   Collection Time: 01/16/21 11:15 AM   Specimen: In/Out Cath Urine  Result Value Ref Range  Status   Specimen Description IN/OUT CATH URINE  Final   Special Requests   Final    NONE Performed at [facility], [address] [place], [place] [address]    Culture >=100,000 COLONIES/mL ESCHERICHIA COLI (A)  Final   Report Status 01/18/2021 FINAL  Final   Organism ID, Bacteria ESCHERICHIA COLI (A)  Final      Susceptibility   Escherichia coli - MIC*  AMPICILLIN >=32 RESISTANT Resistant     CEFAZOLIN  16 SENSITIVE Sensitive     CEFEPIME <=0.12 SENSITIVE Sensitive     CEFTRIAXONE  <=0.25 SENSITIVE Sensitive     CIPROFLOXACIN <=0.25 SENSITIVE Sensitive     GENTAMICIN <=1 SENSITIVE Sensitive     IMIPENEM <=0.25 SENSITIVE Sensitive     NITROFURANTOIN <=16 SENSITIVE Sensitive     TRIMETH/SULFA <=20 SENSITIVE Sensitive     AMPICILLIN/SULBACTAM >=32 RESISTANT Resistant     PIP/TAZO <=4 SENSITIVE Sensitive     * >=100,000 COLONIES/mL ESCHERICHIA COLI  Resp Panel by RT-PCR (Flu A&B, Covid) Nasopharyngeal Swab     Status: None   Collection Time: 01/16/21 11:16 AM   Specimen: Nasopharyngeal Swab; Nasopharyngeal(NP) swabs in vial transport medium  Result Value Ref Range Status   SARS Coronavirus 2 by RT PCR NEGATIVE NEGATIVE Final    Comment: (NOTE) SARS-CoV-2 target nucleic acids are NOT DETECTED.  The SARS-CoV-2 RNA is generally detectable in upper respiratory specimens during the acute phase of infection. The lowest concentration of SARS-CoV-2 viral copies this assay can detect is 138 copies/mL. A negative result does not preclude SARS-Cov-2 infection and should not be used as the sole basis for treatment or other patient management decisions. A negative result may occur with  improper specimen collection/handling, submission of specimen other than nasopharyngeal swab, presence of viral mutation(s) within the areas targeted by this assay, and inadequate number of viral copies(<138 copies/mL). A negative result must be combined with clinical observations, patient history,  and epidemiological information. The expected result is Negative.  Fact Sheet for Patients:  BloggerCourse.com  Fact Sheet for Healthcare Providers:  SeriousBroker.it  This test is no t yet approved or cleared by the [place]  FDA and  has been authorized for detection and/or diagnosis of SARS-CoV-2 by FDA under an Emergency Use Authorization (EUA). This EUA will remain  in effect (meaning this test can be used) for the duration of the COVID-19 declaration under Section 564(b)(1) of the Act, 21 U.S.C.section 360bbb-3(b)(1), unless the authorization is terminated  or revoked sooner.       Influenza A by PCR NEGATIVE NEGATIVE Final   Influenza B by PCR NEGATIVE NEGATIVE Final    Comment: (NOTE) The Xpert Xpress SARS-CoV-2/FLU/RSV plus assay is intended as an aid in the diagnosis of influenza from Nasopharyngeal swab specimens and should not be used as a sole basis for treatment. Nasal washings and aspirates are unacceptable for Xpert Xpress SARS-CoV-2/FLU/RSV testing.  Fact Sheet for Patients: BloggerCourse.com  Fact Sheet for Healthcare Providers: SeriousBroker.it  This test is not yet approved or cleared by the [place]  FDA and has been authorized for detection and/or diagnosis of SARS-CoV-2 by FDA under an Emergency Use Authorization (EUA). This EUA will remain in effect (meaning this test can be used) for the duration of the COVID-19 declaration under Section 564(b)(1) of the Act, 21 U.S.C. section 360bbb-3(b)(1), unless the authorization is terminated or revoked.  Performed at [facility], [address] [place], [place] [address]     Labs: CBC: Recent Labs  Lab 08/12/23 0717  WBC 5.5  HGB 13.3  HCT 39.4  MCV 93.6  PLT 239   Basic Metabolic Panel: Recent Labs  Lab 08/12/23 0717  NA 137  K 3.7  CL 105  CO2 25  GLUCOSE 112*  BUN 16   CREATININE 1.24  CALCIUM  8.5*   Liver Function Tests: Recent Labs  Lab 08/12/23 0717  AST 21  ALT 16  ALKPHOS 45  BILITOT 1.1  PROT 6.3*  ALBUMIN 3.2*   CBG: Recent Labs  Lab 08/12/23 1149 08/12/23 1651 08/12/23 2158 08/13/23 0623 08/13/23 1218  GLUCAP 119* 121* 119* 110* 120*    Discharge time spent: approximately 45 minutes spent on discharge counseling, evaluation of patient on day of discharge, and coordination of discharge planning with nursing, social work, pharmacy and case management  Signed: Lonni SHAUNNA Dalton, MD [facility] 08/13/2023

## 2023-08-13 NOTE — H&P (View-Only) (Signed)
 ELECTROPHYSIOLOGY CONSULT NOTE  Patient ID: Terry Lyons MRN: [id number], DOB/AGE: 80-12-1945   Admit date: 08/11/2023 Date of Consult: 08/13/2023  Primary Physician: Terry Anes, MD Primary Cardiologist: Dr. Alveta (2022)  Reason for Consultation: Cryptogenic stroke, recommendations regarding Implantable Loop Recorder, requested by Dr. Rosemarie  History of Present Illness Terry Lyons was admitted on 08/11/2023 with speech difficulties, off balance, dizzy > stroke.    PMHx includes:HTN, Coronary CCA revealed moderate nonobstructive CAD, HLD, CKD (IIIa), hypothyroidism  Neurology notes: right frontal infarct and left frontal infarct left basal ganglia and right cerebellum  Etiology:  likely cardio embolic given multiple vascular territories involvement.  he has undergone workup for stroke including echocardiogram and carotid dopplers.  The patient has been monitored on telemetry which has demonstrated sinus rhythm with no arrhythmias.  Inpatient stroke work-up is to be completed with a TEE.   Echocardiogram this admission ([facility]) on 08/11/23 demonstrated   Technically difficult 2D M-mode and color-flow Doppler echocardiogram  demonstrates overall adequate systolic left ventricular performance.  2.  The aortic valve is thickened and demonstrates reduced leaflet  mobility.  Aortic stenosis is present but appears to be mild with  estimated aortic valve area 1.5 cm.  No aortic insufficiency is noted  3.  There is mitral annular calcification with reduced anterior leaflet  mobility.  No mitral valve area was calculated due to the difficulty of  the study.  Mild mitral stenosis cannot be definitely excluded but if  present is likely not severe.  4.  Left atrium feels to be mildly enlarged, right atrium is normal  5.  Right ventricular structure and function is normal  6.  The pericardium is normal in thickness there is no effusion  1.  Left Ventricle  Left ventricle size is normal. Wall  thickness is normal. Systolic function is normal. EF: 60-65%.  Right Ventricle  Systolic function is normal. Left Atrium  Left atrium is mildly dilated. at 5.000 cm.  Right Atrium  Right atrium size is normal.  IVC/SVC  The inferior vena cava demonstrates a diameter of <=2.1 cm and collapses >50%; therefore, the right atrial pressure is estimated at 3 mmHg.  Mitral Valve  The leaflets are mildly thickened. There is anterior annular calcification. There is no mitral regurgitation. Mild mitral stenosis cannot be excluded  Tricuspid Valve  Tricuspid valve structure is normal. There is trace regurgitation.  Aortic Valve  The aortic valve is tricuspid. The leaflets are moderately thickened and exhibit moderately reduced excursion. There is mild stenosis, with peak and mean gradients of 32.000 and 17.000 mmHg.  Pulmonic Valve  The pulmonic valve was not well visualized.  Ascending Aorta  The aortic root is normal in size.  Pericardium  There is no pericardial effusion.    Lab work is reviewed.   Prior to admission, the patient denies chest pain, shortness of breath, dizziness, palpitations, or syncope, does say occasionally has a feeling in his chest, generally transient, his daughter mentions of late unusually tired.  They are recovering from their stroke with plans to home at discharge.   Past Medical History:  Diagnosis Date   Allergy    Arthritis    CHF (congestive heart failure) (HCC)    hospitalized in 2017; was diagnosed with an acute excacerbation.    CKD stage 3a, GFR 45-59 ml/min (HCC)    Colon polyps    adenomatous   Diabetes (HCC)    Diabetes mellitus    GERD (gastroesophageal reflux  disease)    Gout    History of basal cell carcinoma    W-S Derm   History of squamous cell carcinoma    Hyperlipidemia    Hypertension    Obesity    Rosacea    Tachycardia      Surgical History:  Past Surgical History:  Procedure Laterality Date   colonoscopy with  polypectomy     X3   ESOPHAGOGASTRODUODENOSCOPY N/A 03/10/2015   Procedure: ESOPHAGOGASTRODUODENOSCOPY (EGD);  Surgeon: [name], MD;  Location: [facility];  Service: Endoscopy;  Laterality: N/A;   esophagus dilated     X3   EUS N/A 05/20/2015   Procedure: UPPER ENDOSCOPIC ULTRASOUND (EUS) RADIAL;  Surgeon: Toribio SHAUNNA Cedar, MD;  Location: [facility];  Service: Endoscopy;  Laterality: N/A;   KNEE ARTHROSCOPY     bilaterally   PILONIDAL CYST EXCISION     REPLACEMENT TOTAL KNEE Right    R   SHOULDER SURGERY     right shoulder   TONSILLECTOMY     total ethmoidectomy & sphenoidectomiy Bilateral 05/27/13   also reduction of turbinates   TOTAL KNEE ARTHROPLASTY     RIGHT   TOTAL SHOULDER ARTHROPLASTY Left 10/04/2017   Procedure: LEFT TOTAL SHOULDER ARTHROPLASTY;  Surgeon: [name], MD;  Location: [facility];  Service: Orthopedics;  Laterality: Left;     Medications Prior to Admission  Medication Sig Dispense Refill Last Dose/Taking   Acetaminophen  500 MG coapsule Take 1 capsule by mouth 4 (four) times daily.   08/11/2023   allopurinol  (ZYLOPRIM ) 300 MG tablet Take 300 mg by mouth daily. (Patient taking differently: Take 300 mg by mouth daily.)   08/11/2023   aspirin  EC 81 MG tablet Take 81 mg by mouth daily.   08/11/2023   carvedilol  (COREG ) 25 MG tablet Take 1 tablet (25 mg total) by mouth 2 (two) times daily with a meal. 180 tablet 3 08/11/2023   cetirizine  (ZYRTEC ) 10 MG tablet Take 10 mg by mouth daily.   08/11/2023   famotidine  (PEPCID ) 20 MG tablet Take 40 mg by mouth at bedtime.   08/10/2023   fenofibrate  160 MG tablet Take 1 tablet (160 mg total) by mouth daily. 90 tablet 3 08/11/2023   gabapentin  (NEURONTIN ) 300 MG capsule Take 300 mg by mouth 2 (two) times daily as needed (neuropathic pain).   08/11/2023   levothyroxine  (SYNTHROID ) 50 MCG tablet Take 50-100 mcg by mouth daily before breakfast. Takes 100 mcg on Sunday, 50 mcg all other days   08/11/2023   melatonin 3 MG TABS tablet  Take 3 mg by mouth at bedtime.   08/10/2023   metFORMIN  (GLUCOPHAGE ) 500 MG tablet Take 500 mg by mouth 2 (two) times daily with a meal.   08/11/2023   mirtazapine  (REMERON ) 30 MG tablet Take 15 mg by mouth at bedtime.   08/10/2023   omeprazole  (PRILOSEC) 20 MG capsule Take 40 mg by mouth 2 (two) times daily.    08/11/2023   potassium chloride  (K-DUR) 10 MEQ tablet Take 10 mEq by mouth daily.   08/11/2023   pravastatin  (PRAVACHOL ) 20 MG tablet Take 20 mg by mouth at bedtime.   08/10/2023   QUEtiapine  (SEROQUEL ) 25 MG tablet Take 12.5 mg by mouth at bedtime.   08/10/2023   tamsulosin  (FLOMAX ) 0.4 MG CAPS capsule Take 1 capsule (0.4 mg total) by mouth daily after supper. 30 capsule 0 08/11/2023   traZODone (DESYREL) 100 MG tablet Take 100 mg by mouth at bedtime.   08/10/2023  Menthol -Methyl Salicylate (THERA-GESIC EX) Apply 1 application topically 4 (four) times daily as needed (knee pain.).    Unknown    Inpatient Medications:   aspirin  EC  81 mg Oral Daily   clopidogrel  75 mg Oral Daily   diclofenac Sodium  2 g Topical QID   enoxaparin  (LOVENOX ) injection  40 mg Subcutaneous Q24H   famotidine   20 mg Oral QHS   insulin  aspart  0-5 Units Subcutaneous QHS   insulin  aspart  0-6 Units Subcutaneous TID WC   levothyroxine   50 mcg Oral QAC breakfast   melatonin  3 mg Oral QHS   mirtazapine   15 mg Oral QHS   pantoprazole   20 mg Oral BID   polyethylene glycol  17 g Oral Daily   pravastatin   20 mg Oral QPM   QUEtiapine   12.5 mg Oral QHS   tamsulosin   0.4 mg Oral QPC supper   traZODone  100 mg Oral QHS    Allergies:  Allergies  Allergen Reactions   Sulfa Antibiotics     UNSPECIFIED REACTION    Bupropion Hcl Other (See Comments)    REACTION: insomnia   Codeine Nausea Only   Escitalopram Oxalate Other (See Comments)    REACTION: insomnia   Sertraline Hcl Other (See Comments)    REACTION: insomnia    Social History   Socioeconomic History   Marital status: Married    Spouse name: Not on  file   Number of children: 2   Years of education: Not on file   Highest education level: Not on file  Occupational History   Occupation: retired  Tobacco Use   Smoking status: Former    Current packs/day: 0.00    Types: Cigarettes    Quit date: 10/17/1973    Years since quitting: 49.8   Smokeless tobacco: Never  Vaping Use   Vaping status: Never Used  Substance and Sexual Activity   Alcohol  use: No    Alcohol /week: 0.0 standard drinks of alcohol    Drug use: No   Sexual activity: Not on file  Other Topics Concern   Not on file  Social History Narrative   Pt's states no medical or surgical changes since previsit or office visit.   Social Drivers of Corporate investment banker Strain: Not on file  Food Insecurity: No Food Insecurity (08/11/2023)   Hunger Vital Sign    Worried About Running Out of Food in the Last Year: Never true    Ran Out of Food in the Last Year: Never true  Transportation Needs: No Transportation Needs (08/11/2023)   PRAPARE - Administrator, Civil Service (Medical): No    Lack of Transportation (Non-Medical): No  Physical Activity: Not on file  Stress: Not on file  Social Connections: Unknown (06/26/2021)   Received from [facility]   Social Network    Social Network: Not on file  Intimate Partner Violence: Not At Risk (08/11/2023)   Humiliation, Afraid, Rape, and Kick questionnaire    Fear of Current or Ex-Partner: No    Emotionally Abused: No    Physically Abused: No    Sexually Abused: No     Family History  Problem Relation Age of Onset   Lung cancer Mother        smoker   Heart disease Father        Rheumatic Heart Disease   Heart attack Brother 11       CBAG   Heart attack Paternal Grandmother 2  Heart attack Paternal Grandfather 42   Lung cancer Maternal Aunt        smoker   Liver disease Neg Hx    Esophageal cancer Neg Hx    Colon cancer Neg Hx       Review of Systems: All other systems reviewed and are  otherwise negative except as noted above.  Physical Exam: Vitals:   08/12/23 1958 08/13/23 0001 08/13/23 0350 08/13/23 0846  BP: 108/60 (!) 112/57 133/76 (!) 147/76  Pulse: 76 76 91 83  Resp: 18 18 18 18   Temp: 98 F (36.7 C) 98 F (36.7 C) 98 F (36.7 C) 98.3 F (36.8 C)  TempSrc: Oral   Oral  SpO2: 98% 94% 98% 93%    GEN- The patient is well appearing, alert and oriented x 3 today.   Head- normocephalic, atraumatic Eyes-  Sclera clear, conjunctiva pink Ears- hearing intact Oropharynx- clear Neck- supple Lungs- CTA b/l, normal work of breathing Heart- RRR, no murmurs, rubs or gallops  GI- soft, NT, ND Extremities- no clubbing, cyanosis, or edema MS- no significant deformity or atrophy Skin- no rash or lesion Psych- euthymic mood, full affect   Labs:   Lab Results  Component Value Date   WBC 5.5 08/12/2023   HGB 13.3 08/12/2023   HCT 39.4 08/12/2023   MCV 93.6 08/12/2023   PLT 239 08/12/2023    Recent Labs  Lab 08/12/23 0717  NA 137  K 3.7  CL 105  CO2 25  BUN 16  CREATININE 1.24  CALCIUM  8.5*  PROT 6.3*  BILITOT 1.1  ALKPHOS 45  ALT 16  AST 21  GLUCOSE 112*   Lab Results  Component Value Date   CKTOTAL 73 06/10/2008   CKMB 0.5 06/10/2008   TROPONINI 0.01        NO INDICATION OF MYOCARDIAL INJURY. 06/10/2008   Lab Results  Component Value Date   CHOL 133 02/20/2020   CHOL 188 12/21/2010   Lab Results  Component Value Date   HDL 47 02/20/2020   HDL 45.30 12/21/2010   Lab Results  Component Value Date   LDLCALC 46 02/20/2020   Lab Results  Component Value Date   TRIG 260 (H) 02/20/2020   TRIG 252.0 (H) 12/21/2010   Lab Results  Component Value Date   CHOLHDL 2.8 02/20/2020   CHOLHDL 4 12/21/2010   Lab Results  Component Value Date   LDLDIRECT 104.7 12/21/2010    Lab Results  Component Value Date   DDIMER (H) 06/09/2008    1.34        AT THE INHOUSE ESTABLISHED CUTOFF VALUE OF 0.48 ug/mL FEU, THIS ASSAY HAS BEEN  DOCUMENTED IN THE LITERATURE TO HAVE A SENSITIVITY AND NEGATIVE PREDICTIVE VALUE OF AT LEAST 98 TO 99%.  THE TEST RESULT SHOULD BE CORRELATED WITH AN ASSESSMENT OF THE CLINICAL PROBABILITY OF DVT / VTE.     Radiology/Studies:  VAS US  LOWER EXTREMITY VENOUS (DVT) Result Date: 08/12/2023  Lower Venous DVT Study Patient Name:  AKASHDEEP CHUBA  Date of Exam:   08/12/2023 Medical Rec #: [id number]   Accession #:    [id number] Date of Birth: 01/01/1946    Patient Gender: M Patient Age:   80 years Exam Location:  [facility] Procedure:      VAS US  LOWER EXTREMITY VENOUS (DVT) Referring Phys: LOLA JERNIGAN --------------------------------------------------------------------------------  Indications: Edema.  Performing Technologist: Elmarie Lindau, RVT  Examination Guidelines: A complete evaluation includes B-mode imaging, spectral Doppler, color Doppler,  and power Doppler as needed of all accessible portions of each vessel. Bilateral testing is considered an integral part of a complete examination. Limited examinations for reoccurring indications may be performed as noted. The reflux portion of the exam is performed with the patient in reverse Trendelenburg.   Summary: BILATERAL: - No evidence of deep vein thrombosis seen in the lower extremities, bilaterally. -No evidence of popliteal cyst, bilaterally.   *See table(s) above for measurements and observations. Electronically signed by Norman Serve on 08/12/2023 at 2:00:28 PM.    Final     12-lead ECG  All prior EKG's in EPIC reviewed with no documented atrial fibrillation  Telemetry SR  Assessment and Plan:  1. Cryptogenic stroke The patient presents with cryptogenic stroke.  Neurology has deferred TEE, neg LE venous study b/l.  I spoke at length with the patient about monitoring for afib with either a 30 day event monitor or an implantable loop recorder.  Risks, benefits, and alteratives to implantable loop recorder were discussed with the patient  today.   At this time, the patient is very clear in their decision to proceed with implantable loop recorder.   Wound care was reviewed with the patient (keep incision clean and dry for 3 days).  Wound check follow up will be scheduled for the patient.  Please call with questions.   Renee Macario Arthur, PA-C 08/13/2023  I have seen, examined the patient, and reviewed the above assessment and plan.    HPI: Mr. Nohr is an 79 year old male with past medical history notable for hypertension, hyperlipidemia, type 2 diabetes, PTSD, insomnia, hypothyroidism, CKD stage III who presented to ED on 08/10/2023 with vision changes and imbalance.  He was found to have multiple embolic supratentorial infarcts on MRI.  This was felt to be concerning for cardioembolic etiology.  Electrophysiology has been consulted for loop recorder implant.  Today, patient reports feeling relatively well.  He has no new or acute complaints.  General: Well developed, in no acute distress.  Neck: No JVD.  Cardiac: Normal rate, regular rhythm.  Resp: Normal work of breathing.  Ext: No edema.   Psych: Normal affect.   Assessment and Plan:  #.  Cryptogenic stroke: Infarcts in multiple territories is concerning for cardioembolic etiology.  Patient has no known history of atrial fibrillation.  Discussed role of prolonged monitoring for atrial fibrillation in efforts to identify etiology for his strokes.  Patient and daughter voiced understanding.  Discussed role of loop recorder for extended monitoring.  Again, patient and daughter voiced understanding. -Explained risks, benefits, and alternatives to ICD implantation, including but not limited to bleeding, infection, damage to nearby structures.  Pt verbalized understanding and wants to proceed prior to discharge.  Tentatively plan for loop recorder implant this afternoon.   [name], MD 08/13/2023 10:43 AM

## 2023-08-13 NOTE — Discharge Instructions (Signed)

## 2023-08-13 NOTE — Plan of Care (Signed)

## 2023-08-13 NOTE — TOC CM/SW Note (Addendum)
 Transition of Care [facility]) - Inpatient Brief Assessment   Patient Details  Name: Terry Lyons MRN: [id number] Date of Birth: 1945-07-31  Transition of Care [facility] CM/SW Contact:    Waddell Barnie Rama, RN Phone Number: 08/13/2023, 10:43 AM   Clinical Narrative: From home with spouse, has PCP and insurance on file, states has no HH services in place at this time , has walker, cane, grab bars , bsc, shower chair, at home.  States daughter  will transport them home at [employer] and family is support system, states gets medications from the [facility] and if local , [facility] at [facility].    Pta self ambulatory cane/walker.  Patient gives this NCM permission to speak with daughter, Terry Lyons [phone].  Per pt eval rec outpt neuro physical therapy.  NCM made referral to outpatient neuro physical therapy.  Daughter states she will take him there for apts.  Daughter would like to see if the [facility] can provide an aide for him at home also. NCM contacted the [facility], they have put in a consult for patient for the home maker program.  CSW at the [facility] is [facility] [phone] ext [phone]. They will call this NCM for initial assessment per CSW. Per PT eval rec outpatient PT, he will also need outpatient speech per Daughter.  NCM sent referral thru epic for outpatient PT and ST to neuro rehab on [address].   Transition of Care Asessment: Insurance and Status: Insurance coverage has been reviewed Patient has primary care physician: Yes Home environment has been reviewed: home with wife Prior level of function:: ambulator with Engineer, [employer] Home Services: Current home services (walker, grab bars, bsc, shower chair,) Social Drivers of Health Review: SDOH reviewed no interventions necessary Readmission risk has been reviewed: Yes Transition of care needs: transition of care needs identified, TOC will continue to follow

## 2023-08-13 NOTE — Evaluation (Signed)
 Speech Language Pathology Evaluation Patient Details Name: Terry Lyons MRN: [id number] DOB: [birth year] Today's Date: 08/13/2023 Time: 1030-1109 SLP Time Calculation (min) (ACUTE ONLY): 39 min  Problem List:  Patient Active Problem List   Diagnosis Date Noted   Class 3 obesity 08/12/2023   Acute CVA (cerebrovascular accident) (HCC) 08/11/2023   CKD stage 3a, GFR 45-59 ml/min (HCC) 08/11/2023   Insomnia 08/11/2023   Acute metabolic encephalopathy 01/17/2021   Encephalopathy 01/16/2021   History of squamous cell carcinoma    Status post total shoulder arthroplasty 10/04/2017   Pain in left knee 06/08/2017   Osteoarthritis of glenohumeral joint 05/21/2017   Esophageal candidiasis (HCC) 03/13/2015   Acute diastolic CHF (congestive heart failure) (HCC) 03/13/2015   Sinus tachycardia 03/10/2015   Hypothyroidism 03/10/2015   Hypoxemia    Uncontrolled diabetes mellitus type 2 without complications 03/07/2015   Chest pain on breathing    Intractable nausea and vomiting 03/06/2015   Abdominal pain 03/06/2015   Severe obesity (BMI >= 40) (HCC) 05/21/2013   Chronic sphenoidal sinusitis 05/21/2013   Hypokalemia 05/21/2013   Sinusitis, chronic 12/19/2012   History of anesthesia reaction 03/06/2012   Testosterone  deficiency 03/06/2012   Type II diabetes mellitus (HCC) 12/26/2010   FATIGUE 04/26/2010   EDEMA 04/26/2010   PAROXYSMAL NOCTURNAL DYSPNEA 04/26/2010   ESOPHAGEAL STRICTURE 07/17/2008   FATTY LIVER DISEASE 07/17/2008   Esophageal dysphagia 07/17/2008   Essential hypertension, benign 07/02/2008   Angioedema 06/08/2008   UNSPECIFIED ANEMIA 05/25/2008   URTICARIA 05/25/2008   HYPONATREMIA 05/03/2008   Disorder resulting from impaired renal function 05/03/2008   Hyperlipidemia 11/05/2007   NONSPEC ELEVATION OF LEVELS OF TRANSAMINASE/LDH 11/05/2007   SKIN CANCER, HX OF 11/05/2007   GOUT, UNSPECIFIED 02/05/2007   DYSMETABOLIC SYNDROME X 02/05/2007   GERD 02/05/2007   Past  Medical History:  Past Medical History:  Diagnosis Date   Allergy    Arthritis    CHF (congestive heart failure) (HCC)    hospitalized in 2017; was diagnosed with an acute excacerbation.    CKD stage 3a, GFR 45-59 ml/min (HCC)    Colon polyps    adenomatous   Diabetes (HCC)    Diabetes mellitus    GERD (gastroesophageal reflux disease)    Gout    History of basal cell carcinoma    W-S Derm   History of squamous cell carcinoma    Hyperlipidemia    Hypertension    Obesity    Rosacea    Tachycardia    Past Surgical History:  Past Surgical History:  Procedure Laterality Date   colonoscopy with polypectomy     X3   ESOPHAGOGASTRODUODENOSCOPY N/A 03/10/2015   Procedure: ESOPHAGOGASTRODUODENOSCOPY (EGD);  Surgeon: [name], MD;  Location: [facility];  Service: Endoscopy;  Laterality: N/A;   esophagus dilated     X3   EUS N/A 05/20/2015   Procedure: UPPER ENDOSCOPIC ULTRASOUND (EUS) RADIAL;  Surgeon: Toribio SHAUNNA Cedar, MD;  Location: [facility];  Service: Endoscopy;  Laterality: N/A;   KNEE ARTHROSCOPY     bilaterally   PILONIDAL CYST EXCISION     REPLACEMENT TOTAL KNEE Right    R   SHOULDER SURGERY     right shoulder   TONSILLECTOMY     total ethmoidectomy & sphenoidectomiy Bilateral 05/27/13   also reduction of turbinates   TOTAL KNEE ARTHROPLASTY     RIGHT   TOTAL SHOULDER ARTHROPLASTY Left 10/04/2017   Procedure: LEFT TOTAL SHOULDER ARTHROPLASTY;  Surgeon: [name], MD;  Location:  [facility];  Service: Orthopedics;  Laterality: Left;   HPI:  78yo male [place]) admitted 08/11/23 with double vision, balance difficulty, vertigo. PMH: CHF, HTN, HLD, DM2, GERD, PTSD, insomnia, hypothyroidism, CKD3a. MRI - acute infarct right frontal cortex, left frontal cortex, subcortical white matter, left basal ganglia, right cerebellum.   Assessment / Plan / Recommendation Clinical Impression  Pt seen at bedside for cognitive linguistic evaluation. Pt reports being primary  caregiver of his wife at home prior to admit, and was independent with finances, medications, driving, and household responsibilities (assist with house cleaning). Pt does light cooking, but reports they eat out frequently. Pt's daughter was present and indicates pt has lost ~70# over the past year, due to decrease in PO intake. Pt reports occasional headaches related to vision, and chronic knee pain.   The [employer] Mental Status (SLUMS) Examination was administered today. Pt scored 24/30, raising concern for neurocognitive deficits. Points were lost on mental math, thought organization, and delayed recall. Pt indicates long-COVID has had an effect on his memory. Receptive language appears intact. Expressive language was noted to have slight intermittent hesitations. Speech is fully intelligible. CN exam unremarkable.   Given current deficits noted, and considering pt's level of independence and being primary caregiver for his wife prior to admit, home health ST is recommended at discharge to maximize safety and independence. Pt/daughter and medical team informed of results and recommendations.    SLP Assessment  SLP Recommendation/Assessment: All further Speech Language Pathology needs can be addressed in the next venue of care - Home health ST recommended  SLP Visit Diagnosis: Cognitive communication deficit (R41.841)     Assistance Recommended at Discharge  Intermittent Supervision/Assistance  Functional Status Assessment Patient has had a recent decline in their functional status and demonstrates the ability to make significant improvements in function in a reasonable and predictable amount of time.     SLP Evaluation Cognition  Overall Cognitive Status: Impaired/Different from baseline Arousal/Alertness: Awake/alert Orientation Level: Oriented X4       Comprehension  Auditory Comprehension Overall Auditory Comprehension: Appears within functional limits for tasks assessed     Expression Expression Primary Mode of Expression: Verbal Verbal Expression Overall Verbal Expression: Impaired Written Expression Dominant Hand: Right    Oral / Motor  Oral Motor/Sensory Function Overall Oral Motor/Sensory Function: Within functional limits Motor Speech Overall Motor Speech: Appears within functional limits for tasks assessed Respiration: Within functional limits Resonance: Within functional limits Articulation: Within functional limitis Intelligibility: Intelligible Motor Planning: Within functional limits           Srihaan Mastrangelo B. Dory, MSP, CCC-SLP Speech Language Pathologist Office: [phone]  Dory Caprice Daring 08/13/2023, 12:48 PM

## 2023-08-13 NOTE — Plan of Care (Signed)
  Problem: Health Behavior/Discharge Planning: Goal: Ability to manage health-related needs will improve Outcome: Progressing   Problem: Clinical Measurements: Goal: Will remain free from infection Outcome: Progressing   Problem: Activity: Goal: Risk for activity intolerance will decrease Outcome: Progressing   Problem: Nutrition: Goal: Adequate nutrition will be maintained Outcome: Progressing   Problem: Pain Managment: Goal: General experience of comfort will improve and/or be controlled Outcome: Progressing   Problem: Safety: Goal: Ability to remain free from injury will improve Outcome: Progressing   Problem: Skin Integrity: Goal: Risk for impaired skin integrity will decrease Outcome: Progressing   Problem: Ischemic Stroke/TIA Tissue Perfusion: Goal: Complications of ischemic stroke/TIA will be minimized Outcome: Progressing

## 2023-08-15 ENCOUNTER — Ambulatory Visit

## 2023-08-15 LAB — VITAMIN B1: Vitamin B1 (Thiamine): 137.1 nmol/L (ref 66.5–200.0)

## 2023-08-16 ENCOUNTER — Encounter

## 2023-09-05 ENCOUNTER — Ambulatory Visit

## 2023-09-05 ENCOUNTER — Other Ambulatory Visit: Payer: Self-pay

## 2023-09-05 ENCOUNTER — Ambulatory Visit: Attending: Family Medicine

## 2023-09-05 DIAGNOSIS — I639 Cerebral infarction, unspecified: Secondary | ICD-10-CM | POA: Diagnosis not present

## 2023-09-05 DIAGNOSIS — R29898 Other symptoms and signs involving the musculoskeletal system: Secondary | ICD-10-CM | POA: Insufficient documentation

## 2023-09-05 DIAGNOSIS — R262 Difficulty in walking, not elsewhere classified: Secondary | ICD-10-CM | POA: Insufficient documentation

## 2023-09-05 DIAGNOSIS — R2681 Unsteadiness on feet: Secondary | ICD-10-CM | POA: Diagnosis present

## 2023-09-05 DIAGNOSIS — M6281 Muscle weakness (generalized): Secondary | ICD-10-CM | POA: Diagnosis present

## 2023-09-05 NOTE — Therapy (Signed)
 OUTPATIENT PHYSICAL THERAPY NEURO EVALUATION   Patient Name: Terry Lyons MRN: [id number] DOB:[birth year], 78 y.o., male Today's Date: 09/05/2023   PCP: [name], MD REFERRING PROVIDER: [name], MD/// [name], MD  END OF SESSION:  PT End of Session - 09/05/23 1455     Visit Number 1    Number of Visits 14    Date for PT Re-Evaluation 10/24/23    Authorization Type [place]    PT Start Time 1455   late arrival   PT Stop Time 1540    PT Time Calculation (min) 45 min          Past Medical History:  Diagnosis Date   Allergy    Arthritis    CHF (congestive heart failure) (HCC)    hospitalized in 2017; was diagnosed with an acute excacerbation.    CKD stage 3a, GFR 45-59 ml/min (HCC)    Colon polyps    adenomatous   Diabetes (HCC)    Diabetes mellitus    GERD (gastroesophageal reflux disease)    Gout    History of basal cell carcinoma    W-S Derm   History of squamous cell carcinoma    Hyperlipidemia    Hypertension    Obesity    Rosacea    Tachycardia    Past Surgical History:  Procedure Laterality Date   colonoscopy with polypectomy     X3   ESOPHAGOGASTRODUODENOSCOPY N/A 03/10/2015   Procedure: ESOPHAGOGASTRODUODENOSCOPY (EGD);  Surgeon: [name], MD;  Location: [facility];  Service: Endoscopy;  Laterality: N/A;   esophagus dilated     X3   EUS N/A 05/20/2015   Procedure: UPPER ENDOSCOPIC ULTRASOUND (EUS) RADIAL;  Surgeon: [name], MD;  Location: [facility];  Service: Endoscopy;  Laterality: N/A;   KNEE ARTHROSCOPY     bilaterally   LOOP RECORDER INSERTION N/A 08/13/2023   Procedure: LOOP RECORDER INSERTION;  Surgeon: [name], MD;  Location: [facility]IVE CV LAB;  Service: Cardiovascular;  Laterality: N/A;   PILONIDAL CYST EXCISION     REPLACEMENT TOTAL KNEE Right    R   SHOULDER SURGERY     right shoulder   TONSILLECTOMY     total ethmoidectomy & sphenoidectomiy Bilateral 05/27/13   also reduction of turbinates    TOTAL KNEE ARTHROPLASTY     RIGHT   TOTAL SHOULDER ARTHROPLASTY Left 10/04/2017   Procedure: LEFT TOTAL SHOULDER ARTHROPLASTY;  Surgeon: [name], MD;  Location: [facility];  Service: Orthopedics;  Laterality: Left;   Patient Active Problem List   Diagnosis Date Noted   Class 3 obesity 08/12/2023   Acute CVA (cerebrovascular accident) (HCC) 08/11/2023   CKD stage 3a, GFR 45-59 ml/min (HCC) 08/11/2023   Insomnia 08/11/2023   Acute metabolic encephalopathy 01/17/2021   Encephalopathy 01/16/2021   History of squamous cell carcinoma    Status post total shoulder arthroplasty 10/04/2017   Pain in left knee 06/08/2017   Osteoarthritis of glenohumeral joint 05/21/2017   Esophageal candidiasis (HCC) 03/13/2015   Acute diastolic CHF (congestive heart failure) (HCC) 03/13/2015   Sinus tachycardia 03/10/2015   Hypothyroidism 03/10/2015   Hypoxemia    Uncontrolled diabetes mellitus type 2 without complications 03/07/2015   Chest pain on breathing    Intractable nausea and vomiting 03/06/2015   Abdominal pain 03/06/2015   Severe obesity (BMI >= 40) (HCC) 05/21/2013   Chronic sphenoidal sinusitis 05/21/2013   Hypokalemia 05/21/2013   Sinusitis, chronic 12/19/2012   History of anesthesia reaction 03/06/2012  Testosterone  deficiency 03/06/2012   Type II diabetes mellitus (HCC) 12/26/2010   FATIGUE 04/26/2010   EDEMA 04/26/2010   PAROXYSMAL NOCTURNAL DYSPNEA 04/26/2010   ESOPHAGEAL STRICTURE 07/17/2008   FATTY LIVER DISEASE 07/17/2008   Esophageal dysphagia 07/17/2008   Essential hypertension, benign 07/02/2008   Angioedema 06/08/2008   UNSPECIFIED ANEMIA 05/25/2008   URTICARIA 05/25/2008   HYPONATREMIA 05/03/2008   Disorder resulting from impaired renal function 05/03/2008   Hyperlipidemia 11/05/2007   NONSPEC ELEVATION OF LEVELS OF TRANSAMINASE/LDH 11/05/2007   SKIN CANCER, HX OF 11/05/2007   GOUT, UNSPECIFIED 02/05/2007   DYSMETABOLIC SYNDROME X 02/05/2007   GERD 02/05/2007     ONSET DATE: June 2025  REFERRING DIAG:  I63.9 (ICD-10-CM) - Acute CVA (cerebrovascular accident) (HCC)    THERAPY DIAG:  No diagnosis found.  Rationale for Evaluation and Treatment: Rehabilitation  SUBJECTIVE:                                                                                                                                                                                             SUBJECTIVE STATEMENT: CVA in June 2025 initially had vision issues and motion sensitivity but this has steadily been improving. Pt is caregiver for spouse but has PCA present throughout the week to assist in these duties. Doesn't feel like there is any obvious weakness but notes some issues with short term memory and word finding difficulties (currently working with Speech Therapy at home). Prior to CVA was using a cane on/off. His chief complaint is comorbid left knee OA/pain/instability  Pt accompanied by: family member  PERTINENT HISTORY: 78 y.o. M with MO, CKD, hypothyroidism, HTN and DM presented with acute vision changes to OSH. MRI brain there showed scattered subcortical (embolic) infarcts in left frontal and lateral cortex, left BG and right cerebellum. Transferred to [facility] for Neurology consultation.  PAIN:  Are you having pain? Nothing acute, just chronic issues of back and left knee  PRECAUTIONS: Fall  RED FLAGS: None   WEIGHT BEARING RESTRICTIONS: No  FALLS: Has patient fallen in last 6 months? No  LIVING ENVIRONMENT: Lives with: lives with their family Lives in: House/apartment-one-level house Stairs: 4 steps to enter Has following equipment at home: Single point cane, Environmental consultant - 4 wheeled, shower chair, and Grab bars  PLOF: Independent  PATIENT GOALS: improve balance, improve transfers  OBJECTIVE:  Note: Objective measures were completed at Evaluation unless otherwise noted.  DIAGNOSTIC FINDINGS: MRI brain showed scattered subcortical infarcts in the left frontal,  lateral cortex, left basal ganglia and right cerebellum.     Non-invasive angiography showed vertebral artery stenoses without concomitant infarct.  Echocardiogram at  OSH showed normal EF, no cardiogenic source.  COGNITION: Overall cognitive status: reports short term memory issues   SENSATION: Not tested  COORDINATION:   EDEMA:    MUSCLE TONE: NT    DTRs:  NT  POSTURE: rounded shoulders and forward head  LOWER EXTREMITY ROM:     Active  Right Eval Left Eval  Hip flexion    Hip extension    Hip abduction    Hip adduction    Hip internal rotation    Hip external rotation    Knee flexion 0 -5  Knee extension    Ankle dorsiflexion 12 12  Ankle plantarflexion    Ankle inversion    Ankle eversion     (Blank rows = not tested)  LOWER EXTREMITY MMT:    BLE 4/5 gross strength to resisted tests w/ discomfort to left knee resistance  BED MOBILITY:  Not tested--reports independence w/ grab bar  TRANSFERS: Sit to stand: Complete Independence  Assistive device utilized: None     Stand to sit: Modified independence  Assistive device utilized: None     Chair to chair: Modified independence  Assistive device utilized: None       RAMP:  Not tested  CURB:  Findings: SBA-CGA  STAIRS: Findings: Comments: step-to w/ HR and supervision GAIT: Findings: Distance walked: clinic and Comments: LLE antalgic, decreased stride length/speed -Supervision level  FUNCTIONAL TESTS:  5 times sit to stand: 59 sec Timed up and go (TUG): 38 sec w/ cane 10 meter walk test: 29.78 sec = 0.75 ft/sec Berg Balance: 40/56    [facility] PT Assessment - 09/05/23 0001       Standardized Balance Assessment   Standardized Balance Assessment Berg Balance Test;10 meter walk test;Five Times Sit to Stand;Timed Up and Go Test    Five times sit to stand comments  59 sec      Berg Balance Test   Sit to Stand Able to stand without using hands and stabilize independently    Standing Unsupported Able  to stand safely 2 minutes    Sitting with Back Unsupported but Feet Supported on Floor or Stool Able to sit safely and securely 2 minutes    Stand to Sit Sits safely with minimal use of hands    Transfers Able to transfer safely, minor use of hands    Standing Unsupported with Eyes Closed Able to stand 10 seconds with supervision    Standing Unsupported with Feet Together Able to place feet together independently and stand for 1 minute with supervision    From Standing, Reach Forward with Outstretched Arm Can reach forward >5 cm safely (2)    From Standing Position, Pick up Object from Floor Unable to try/needs assist to keep balance    From Standing Position, Turn to Look Behind Over each Shoulder Looks behind one side only/other side shows less weight shift    Turn 360 Degrees Needs close supervision or verbal cueing    Standing Unsupported, Alternately Place Feet on Step/Stool Able to stand independently and complete 8 steps >20 seconds    Standing Unsupported, One Foot in Front Able to take small step independently and hold 30 seconds    Standing on One Leg Able to lift leg independently and hold 5-10 seconds    Total Score 40  TREATMENT DATE:     PATIENT EDUCATION: Education details: assessment details, PT POC details, recommend use of RW to support left knee Person educated: Patient and Child(ren) Education method: Explanation Education comprehension: verbalized understanding  HOME EXERCISE PROGRAM: TBD  GOALS: Goals reviewed with patient? Yes  SHORT TERM GOALS: Target date: 09/26/2023    Patient will be independent in HEP to improve functional outcomes Baseline: Goal status: INITIAL  2.  Demo improved BLE strength and reduced risk for falls per time 30 sec 5xSTS test Baseline: 59 sec Goal status: INITIAL  3.  Demo improved mobility  and reduced risk for falls by improved time to 25 sec TUG test Baseline: 38 sec w/ cane Goal status: INITIAL  4.  Report/demo improved transfers by ability to transfer from his recliner at home w/out LOB  Baseline: reports difficulty w/ instances of falling backwards  Goal status: INITIAL  LONG TERM GOALS: Target date: 10/24/2023    Demo low risk for falls per score 49/56 Berg Balance Test to improve safety with ADL Baseline: 40/56 Goal status: INITIAL  2.  Demo improved mobility and reduced risk for falls by improved time to 13.5 sec TUG test Baseline: 38 sec w/ cane Goal status: INITIAL  3.  Demo improved BLE strength and reduced risk for falls per time 20 sec 5xSTS test Baseline: 59 sec Goal status: INITIAL  4.  Ambulation over various surfaces w/ curbs w/ least restrictive AD w/ modified independence to improve safety in community Baseline: CGA Goal status: INITIAL  5.  Increase gait speed to 2.0 ft/sec to improve efficiency of community mobility Baseline: 0.75 ft/sec w/ cane Goal status: INITIAL   ASSESSMENT:  CLINICAL IMPRESSION: Patient is a 78 y.o. male who was seen today for physical therapy evaluation and treatment for issues following CVA.  Exhibits generalized weakness, issues of chronic left knee pain and instability related to OA, balance deficits, activity tolerance limitations, and high risk for falls per outcome measures.  Supervision for ambulation level surfaces and CGA for curb/stair/uneven surface negotiation.  Pt would benefit from PT services to address deficits and limitations and given his orthopedic comorbidities would benefit from aquatic therapy as well to enable greater degree of activity/exercise to accommodate these issues.   OBJECTIVE IMPAIRMENTS: Abnormal gait, decreased activity tolerance, decreased balance, decreased endurance, decreased knowledge of use of DME, difficulty walking, decreased strength, impaired flexibility, and pain.   ACTIVITY  LIMITATIONS: carrying, lifting, bending, standing, stairs, transfers, reach over head, and locomotion level  PARTICIPATION LIMITATIONS: meal prep, cleaning, laundry, interpersonal relationship, driving, shopping, community activity, and yard work  PERSONAL FACTORS: Age, Time since onset of injury/illness/exacerbation, and 3+ comorbidities: PMH are also affecting patient's functional outcome.   REHAB POTENTIAL: Good  CLINICAL DECISION MAKING: Evolving/moderate complexity  EVALUATION COMPLEXITY: Moderate  PLAN:  PT FREQUENCY: 2x/week  PT DURATION: other: 7 weeks  PLANNED INTERVENTIONS: 97750- Physical Performance Testing, 97110-Therapeutic exercises, 97530- Therapeutic activity, [id number]- Neuromuscular re-education, 97535- Self Care, [id number]- Manual therapy, [id number]- Gait training, [phone]- Canalith repositioning, [id number]- Aquatic Therapy, [phone]- Electrical stimulation (unattended), and 20560 (1-2 muscles), [id number] (3+ muscles)- Dry Needling  PLAN FOR NEXT SESSION: vestibular assessment?-no dizziness reported but concerned there are still issues affecting his vision/equilibrium   4:04 PM, 09/05/23 M. Kelly Hendrik Donath, PT, DPT Physical Therapist- Advance Office Number: [phone]

## 2023-09-07 NOTE — Therapy (Signed)
 OUTPATIENT PHYSICAL THERAPY NEURO TREATMENT   Patient Name: Terry Lyons MRN: [id number] DOB:1946/01/30, 78 y.o., male Today's Date: 09/10/2023   PCP: [name], MD REFERRING PROVIDER: [name], MD/// [name], MD  END OF SESSION:  PT End of Session - 09/10/23 1100     Visit Number 2    Number of Visits 14    Date for PT Re-Evaluation 10/24/23    [facility] Type [facility]    PT Start Time 1021    PT Stop Time 1101    PT Time Calculation (min) 40 min    Activity Tolerance Patient tolerated treatment well   nausea   Behavior During Therapy WFL for tasks assessed/performed           Past Medical History:  Diagnosis Date   Allergy    Arthritis    CHF (congestive heart failure) (HCC)    hospitalized in 2017; was diagnosed with an acute excacerbation.    CKD stage 3a, GFR 45-59 ml/min (HCC)    Colon polyps    adenomatous   Diabetes (HCC)    Diabetes mellitus    GERD (gastroesophageal reflux disease)    Gout    History of basal cell carcinoma    W-S Derm   History of squamous cell carcinoma    Hyperlipidemia    Hypertension    Obesity    Rosacea    Tachycardia    Past Surgical History:  Procedure Laterality Date   colonoscopy with polypectomy     X3   ESOPHAGOGASTRODUODENOSCOPY N/A 03/10/2015   Procedure: ESOPHAGOGASTRODUODENOSCOPY (EGD);  Surgeon: [name], MD;  Location: [facility];  Service: Endoscopy;  Laterality: N/A;   esophagus dilated     X3   EUS N/A 05/20/2015   Procedure: UPPER ENDOSCOPIC ULTRASOUND (EUS) RADIAL;  Surgeon: [name], MD;  Location: [facility];  Service: Endoscopy;  Laterality: N/A;   KNEE ARTHROSCOPY     bilaterally   LOOP RECORDER INSERTION N/A 08/13/2023   Procedure: LOOP RECORDER INSERTION;  Surgeon: [name], MD;  Location: [facility] INVASIVE CV LAB;  Service: Cardiovascular;  Laterality: N/A;   PILONIDAL CYST EXCISION     REPLACEMENT TOTAL KNEE Right    R   SHOULDER SURGERY     right shoulder    TONSILLECTOMY     total ethmoidectomy & sphenoidectomiy Bilateral 05/27/13   also reduction of turbinates   TOTAL KNEE ARTHROPLASTY     RIGHT   TOTAL SHOULDER ARTHROPLASTY Left 10/04/2017   Procedure: LEFT TOTAL SHOULDER ARTHROPLASTY;  Surgeon: [name], MD;  Location: [facility];  Service: Orthopedics;  Laterality: Left;   Patient Active Problem List   Diagnosis Date Noted   Class 3 obesity 08/12/2023   Acute CVA (cerebrovascular accident) (HCC) 08/11/2023   CKD stage 3a, GFR 45-59 ml/min (HCC) 08/11/2023   Insomnia 08/11/2023   Acute metabolic encephalopathy 01/17/2021   Encephalopathy 01/16/2021   History of squamous cell carcinoma    Status post total shoulder arthroplasty 10/04/2017   Pain in left knee 06/08/2017   Osteoarthritis of glenohumeral joint 05/21/2017   Esophageal candidiasis (HCC) 03/13/2015   Acute diastolic CHF (congestive heart failure) (HCC) 03/13/2015   Sinus tachycardia 03/10/2015   Hypothyroidism 03/10/2015   Hypoxemia    Uncontrolled diabetes mellitus type 2 without complications 03/07/2015   Chest pain on breathing    Intractable nausea and vomiting 03/06/2015   Abdominal pain 03/06/2015   Severe obesity (BMI >= 40) (HCC) 05/21/2013   Chronic  sphenoidal sinusitis 05/21/2013   Hypokalemia 05/21/2013   Sinusitis, chronic 12/19/2012   History of anesthesia reaction 03/06/2012   Testosterone  deficiency 03/06/2012   Type II diabetes mellitus (HCC) 12/26/2010   FATIGUE 04/26/2010   EDEMA 04/26/2010   PAROXYSMAL NOCTURNAL DYSPNEA 04/26/2010   ESOPHAGEAL STRICTURE 07/17/2008   FATTY LIVER DISEASE 07/17/2008   Esophageal dysphagia 07/17/2008   Essential hypertension, benign 07/02/2008   Angioedema 06/08/2008   UNSPECIFIED ANEMIA 05/25/2008   URTICARIA 05/25/2008   HYPONATREMIA 05/03/2008   Disorder resulting from impaired renal function 05/03/2008   Hyperlipidemia 11/05/2007   NONSPEC ELEVATION OF LEVELS OF TRANSAMINASE/LDH 11/05/2007   SKIN CANCER, HX  OF 11/05/2007   GOUT, UNSPECIFIED 02/05/2007   DYSMETABOLIC SYNDROME X 02/05/2007   GERD 02/05/2007    ONSET DATE: June 2025  REFERRING DIAG:  I63.9 (ICD-10-CM) - Acute CVA (cerebrovascular accident) (HCC)    THERAPY DIAG:  Difficulty in walking, not elsewhere classified  Muscle weakness (generalized)  Unsteadiness on feet  Other symptoms and signs involving the musculoskeletal system  Rationale for Evaluation and Treatment: Rehabilitation  SUBJECTIVE:                                                                                                                                                                                             SUBJECTIVE STATEMENT: No changes. Asking about his ST HEP. Pt reports that he can't remember the last time he had double vision but denies any currently. Reports dizziness occasionally- less often than it was. Reports with quick head motions or when he gets a HA.   Pt accompanied by: self   PERTINENT HISTORY: 78 y.o. M with MO, CKD, hypothyroidism, HTN and DM presented with acute vision changes to OSH. MRI brain there showed scattered subcortical (embolic) infarcts in left frontal and lateral cortex, left BG and right cerebellum. Transferred to [facility] for Neurology consultation.  PAIN:  Are you having pain? Yes: NPRS scale: 4/10 Pain location: L knee Pain description: pt did not report Aggravating factors: pt did not report Relieving factors: sleeve, tylenol    PRECAUTIONS: Fall  RED FLAGS: None   WEIGHT BEARING RESTRICTIONS: No  FALLS: Has patient fallen in last 6 months? No  LIVING ENVIRONMENT: Lives with: lives with their family Lives in: House/apartment-one-level house Stairs: 4 steps to enter Has following equipment at home: Single point cane, Environmental consultant - 4 wheeled, shower chair, and Grab bars  PLOF: Independent  PATIENT GOALS: improve balance, improve transfers  OBJECTIVE:   TODAY'S TREATMENT: 09/10/23   VESTIBULAR  ASSESSMENT   GENERAL OBSERVATION: pt wears bifocals   OCULOMOTOR EXAM: Ocular Alignment: Cover/Uncover Test: WNL  Cover/Cross Cover Test: WNL   Ocular ROM: No Limitations   Spontaneous Nystagmus: absent   Gaze-Induced Nystagmus: absent   Smooth Pursuits: intact   Saccades: intact   Convergence/Divergence: 16 cm d/t L eye     VESTIBULAR - OCULAR REFLEX:    Slow VOR: Normal and Comment: c/o blurriness    VOR Cancellation: Comment: segmental head movement; difficult to discern if d/t neck or visual    Head-Impulse Test: HIT Right: covert HIT Left: negative    AUDITORY SCREEN:  Rinne Test WNL B    POSITIONAL TESTING:  Sit>supine: no dizziness  Left Roll Test: negative, pt c/o nausea  C/o nausea and pt requested to sit up after positional test  155/90 mmHg 97% spO2, 65bpm    Sitting head turns to targets 20: no dizziness Sitting head nods to targets 20: no dizziness Sitting horizontal VOR 30: no dizziness; difficulty maintaining consistent amplitude  Sitting vertical VOR 30: no dizziness; c/o some blurriness in the L eye; difficulty maintaining consistent amplitude   PATIENT EDUCATION: Education details: [facility] as he only have [facility] approved  Person educated: Patient Education method: Explanation Education comprehension: verbalized understanding    Note: Objective measures were completed at Evaluation unless otherwise noted.  DIAGNOSTIC FINDINGS: MRI brain showed scattered subcortical infarcts in the left frontal, lateral cortex, left basal ganglia and right cerebellum.     Non-invasive angiography showed vertebral artery stenoses without concomitant infarct.  Echocardiogram at OSH showed normal EF, no cardiogenic source.  COGNITION: Overall cognitive status: reports short term memory issues   SENSATION: Not tested  COORDINATION:   EDEMA:    MUSCLE TONE: NT    DTRs:  NT  POSTURE: rounded shoulders and forward  head  LOWER EXTREMITY ROM:     Active  Right Eval Left Eval  Hip flexion    Hip extension    Hip abduction    Hip adduction    Hip internal rotation    Hip external rotation    Knee flexion 0 -5  Knee extension    Ankle dorsiflexion 12 12  Ankle plantarflexion    Ankle inversion    Ankle eversion     (Blank rows = not tested)  LOWER EXTREMITY MMT:    BLE 4/5 gross strength to resisted tests w/ discomfort to left knee resistance  BED MOBILITY:  Not tested--reports independence w/ grab bar  TRANSFERS: Sit to stand: Complete Independence  Assistive device utilized: None     Stand to sit: Modified independence  Assistive device utilized: None     Chair to chair: Modified independence  Assistive device utilized: None       RAMP:  Not tested  CURB:  Findings: SBA-CGA  STAIRS: Findings: Comments: step-to w/ HR and supervision GAIT: Findings: Distance walked: clinic and Comments: LLE antalgic, decreased stride length/speed -Supervision level  FUNCTIONAL TESTS:  5 times sit to stand: 59 sec Timed up and go (TUG): 38 sec w/ cane 10 meter walk test: 29.78 sec = 0.75 ft/sec Berg Balance: 40/56  TREATMENT DATE:     PATIENT EDUCATION: Education details: assessment details, PT POC details, recommend use of RW to support left knee Person educated: Patient and Child(ren) Education method: Explanation Education comprehension: verbalized understanding  HOME EXERCISE PROGRAM: TBD  GOALS: Goals reviewed with patient? Yes  SHORT TERM GOALS: Target date: 09/26/2023    Patient will be independent in HEP to improve functional outcomes Baseline: Goal status: IN PROGRESS  2.  Demo improved BLE strength and reduced risk for falls per time 30 sec 5xSTS test Baseline: 59 sec Goal status: IN PROGRESS  3.  Demo improved mobility and reduced  risk for falls by improved time to 25 sec TUG test Baseline: 38 sec w/ cane Goal status: IN PROGRESS  4.  Report/demo improved transfers by ability to transfer from his recliner at home w/out LOB  Baseline: reports difficulty w/ instances of falling backwards  Goal status: IN PROGRESS  LONG TERM GOALS: Target date: 10/24/2023    Demo low risk for falls per score 49/56 Berg Balance Test to improve safety with ADL Baseline: 40/56 Goal status: IN PROGRESS  2.  Demo improved mobility and reduced risk for falls by improved time to 13.5 sec TUG test Baseline: 38 sec w/ cane Goal status: IN PROGRESS  3.  Demo improved BLE strength and reduced risk for falls per time 20 sec 5xSTS test Baseline: 59 sec Goal status: IN PROGRESS  4.  Ambulation over various surfaces w/ curbs w/ least restrictive AD w/ modified independence to improve safety in community Baseline: CGA Goal status: IN PROGRESS  5.  Increase gait speed to 2.0 ft/sec to improve efficiency of community mobility Baseline: 0.75 ft/sec w/ cane Goal status: IN PROGRESS   ASSESSMENT:  CLINICAL IMPRESSION: Patient arrived to session without new complaints. Vestibular assessment revealed L convergence insufficiency, blurriness with VOR, covert positive R HIT, and c/o nausea upon L roll. Patient with elevated BP upon check, but safe to proceed with treatment. Symptoms resolved within 1 minute and patient was able to tolerate remainder of session without symptoms. No complaints upon leaving.   OBJECTIVE IMPAIRMENTS: Abnormal gait, decreased activity tolerance, decreased balance, decreased endurance, decreased knowledge of use of DME, difficulty walking, decreased strength, impaired flexibility, and pain.   ACTIVITY LIMITATIONS: carrying, lifting, bending, standing, stairs, transfers, reach over head, and locomotion level  PARTICIPATION LIMITATIONS: meal prep, cleaning, laundry, interpersonal relationship, driving, shopping, community  activity, and yard work  PERSONAL FACTORS: Age, Time since onset of injury/illness/exacerbation, and 3+ comorbidities: PMH are also affecting patient's functional outcome.   REHAB POTENTIAL: Good  CLINICAL DECISION MAKING: Evolving/moderate complexity  EVALUATION COMPLEXITY: Moderate  PLAN:  PT FREQUENCY: 2x/week  PT DURATION: other: 7 weeks  PLANNED INTERVENTIONS: 97750- Physical Performance Testing, 97110-Therapeutic exercises, 97530- Therapeutic activity, [id number]- Neuromuscular re-education, 97535- Self Care, [id number]- Manual therapy, [id number]- Gait training, [id number]- Canalith repositioning, [id number]- Aquatic Therapy, [id number]- Electrical stimulation (unattended), and [id number] (1-2 muscles), [id number] (3+ muscles)- Dry Needling  PLAN FOR NEXT SESSION: try to test positional testing again- pt became nauseous and had to sit up with L roll. Initiate HEP working on functional, balance    Louana Terrilyn Christians, [place], DPT 09/10/23 11:04 AM  [facility] [place] [address] [place], [address] [place], [place] [address] Phone # [phone] Fax # [phone]

## 2023-09-10 ENCOUNTER — Encounter: Payer: Self-pay | Admitting: Physical Therapy

## 2023-09-10 ENCOUNTER — Ambulatory Visit: Admitting: Physical Therapy

## 2023-09-10 DIAGNOSIS — R29898 Other symptoms and signs involving the musculoskeletal system: Secondary | ICD-10-CM

## 2023-09-10 DIAGNOSIS — R262 Difficulty in walking, not elsewhere classified: Secondary | ICD-10-CM | POA: Diagnosis not present

## 2023-09-10 DIAGNOSIS — M6281 Muscle weakness (generalized): Secondary | ICD-10-CM

## 2023-09-10 DIAGNOSIS — R2681 Unsteadiness on feet: Secondary | ICD-10-CM

## 2023-09-12 ENCOUNTER — Encounter: Payer: Self-pay | Admitting: Physical Therapy

## 2023-09-12 ENCOUNTER — Ambulatory Visit: Admitting: Physical Therapy

## 2023-09-12 DIAGNOSIS — R262 Difficulty in walking, not elsewhere classified: Secondary | ICD-10-CM | POA: Diagnosis not present

## 2023-09-12 DIAGNOSIS — R29898 Other symptoms and signs involving the musculoskeletal system: Secondary | ICD-10-CM

## 2023-09-12 DIAGNOSIS — M6281 Muscle weakness (generalized): Secondary | ICD-10-CM

## 2023-09-12 DIAGNOSIS — R2681 Unsteadiness on feet: Secondary | ICD-10-CM

## 2023-09-12 NOTE — Therapy (Signed)
 OUTPATIENT PHYSICAL THERAPY NEURO TREATMENT   Patient Name: Terry Lyons MRN: [id number] DOB:May 05, 1945, 78 y.o., male Today's Date: 09/12/2023   PCP: [name], MD REFERRING PROVIDER: [name], MD/// [name], MD  END OF SESSION:  PT End of Session - 09/12/23 0811     Visit Number 3    Number of Visits 14    Date for PT Re-Evaluation 10/24/23    [facility]ization Type [facility]    PT Start Time [id number]   late arrival   PT Stop Time 0840    PT Time Calculation (min) 28 min    Activity Tolerance Patient tolerated treatment well   nausea   Behavior During Therapy [facility] for tasks assessed/performed            Past Medical History:  Diagnosis Date   Allergy    Arthritis    CHF (congestive heart failure) (HCC)    hospitalized in 2017; was diagnosed with an acute excacerbation.    CKD stage 3a, GFR 45-59 ml/min (HCC)    Colon polyps    adenomatous   Diabetes (HCC)    Diabetes mellitus    GERD (gastroesophageal reflux disease)    Gout    History of basal cell carcinoma    W-S Derm   History of squamous cell carcinoma    Hyperlipidemia    Hypertension    Obesity    Rosacea    Tachycardia    Past Surgical History:  Procedure Laterality Date   colonoscopy with polypectomy     X3   ESOPHAGOGASTRODUODENOSCOPY N/A 03/10/2015   Procedure: ESOPHAGOGASTRODUODENOSCOPY (EGD);  Surgeon: [name], MD;  Location: [facility];  Service: Endoscopy;  Laterality: N/A;   esophagus dilated     X3   EUS N/A 05/20/2015   Procedure: UPPER ENDOSCOPIC ULTRASOUND (EUS) RADIAL;  Surgeon: [name], MD;  Location: [facility];  Service: Endoscopy;  Laterality: N/A;   KNEE ARTHROSCOPY     bilaterally   LOOP RECORDER INSERTION N/A 08/13/2023   Procedure: LOOP RECORDER INSERTION;  Surgeon: [name], MD;  Location: [facility] INVASIVE CV LAB;  Service: Cardiovascular;  Laterality: N/A;   PILONIDAL CYST EXCISION     REPLACEMENT TOTAL KNEE Right    R   SHOULDER SURGERY      right shoulder   TONSILLECTOMY     total ethmoidectomy & sphenoidectomiy Bilateral 05/27/13   also reduction of turbinates   TOTAL KNEE ARTHROPLASTY     RIGHT   TOTAL SHOULDER ARTHROPLASTY Left 10/04/2017   Procedure: LEFT TOTAL SHOULDER ARTHROPLASTY;  Surgeon: [name], MD;  Location: [facility] OR;  Service: Orthopedics;  Laterality: Left;   Patient Active Problem List   Diagnosis Date Noted   Class 3 obesity 08/12/2023   Acute CVA (cerebrovascular accident) (HCC) 08/11/2023   CKD stage 3a, GFR 45-59 ml/min (HCC) 08/11/2023   Insomnia 08/11/2023   Acute metabolic encephalopathy 01/17/2021   Encephalopathy 01/16/2021   History of squamous cell carcinoma    Status post total shoulder arthroplasty 10/04/2017   Pain in left knee 06/08/2017   Osteoarthritis of glenohumeral joint 05/21/2017   Esophageal candidiasis (HCC) 03/13/2015   Acute diastolic CHF (congestive heart failure) (HCC) 03/13/2015   Sinus tachycardia 03/10/2015   Hypothyroidism 03/10/2015   Hypoxemia    Uncontrolled diabetes mellitus type 2 without complications 03/07/2015   Chest pain on breathing    Intractable nausea and vomiting 03/06/2015   Abdominal pain 03/06/2015   Severe obesity (BMI >= 40) (HCC)  05/21/2013   Chronic sphenoidal sinusitis 05/21/2013   Hypokalemia 05/21/2013   Sinusitis, chronic 12/19/2012   History of anesthesia reaction 03/06/2012   Testosterone  deficiency 03/06/2012   Type II diabetes mellitus (HCC) 12/26/2010   FATIGUE 04/26/2010   EDEMA 04/26/2010   PAROXYSMAL NOCTURNAL DYSPNEA 04/26/2010   ESOPHAGEAL STRICTURE 07/17/2008   FATTY LIVER DISEASE 07/17/2008   Esophageal dysphagia 07/17/2008   Essential hypertension, benign 07/02/2008   Angioedema 06/08/2008   UNSPECIFIED ANEMIA 05/25/2008   URTICARIA 05/25/2008   HYPONATREMIA 05/03/2008   Disorder resulting from impaired renal function 05/03/2008   Hyperlipidemia 11/05/2007   NONSPEC ELEVATION OF LEVELS OF TRANSAMINASE/LDH 11/05/2007    SKIN CANCER, HX OF 11/05/2007   GOUT, UNSPECIFIED 02/05/2007   DYSMETABOLIC SYNDROME X 02/05/2007   GERD 02/05/2007    ONSET DATE: June 2025  REFERRING DIAG:  I63.9 (ICD-10-CM) - Acute CVA (cerebrovascular accident) (HCC)    THERAPY DIAG:  Difficulty in walking, not elsewhere classified  Muscle weakness (generalized)  Unsteadiness on feet  Other symptoms and signs involving the musculoskeletal system  Rationale for Evaluation and Treatment: Rehabilitation  SUBJECTIVE:                                                                                                                                                                                             SUBJECTIVE STATEMENT: No falls. No new complaints. Pt reports he gets neck pain that comes and goes.   Pt accompanied by: self   PERTINENT HISTORY: 78 y.o. M with MO, CKD, hypothyroidism, HTN and DM presented with acute vision changes to OSH. MRI brain there showed scattered subcortical (embolic) infarcts in left frontal and lateral cortex, left BG and right cerebellum. Transferred to [facility] for Neurology consultation. R TKA 15 years ago  PAIN:  Are you having pain? Yes: NPRS scale: 4/10 Pain location: L knee Pain description: pt did not report Aggravating factors: pt did not report Relieving factors: sleeve, tylenol    PRECAUTIONS: Fall  RED FLAGS: None   WEIGHT BEARING RESTRICTIONS: No  FALLS: Has patient fallen in last 6 months? No  LIVING ENVIRONMENT: Lives with: lives with their family Lives in: House/apartment-one-level house Stairs: 4 steps to enter Has following equipment at home: Single point cane, Walker - 4 wheeled, shower chair, and Grab bars  PLOF: Independent  PATIENT GOALS: improve balance, improve transfers  OBJECTIVE:  TREATMENT DATE:  09/12/2023 Activity Comments   Nustep L4 x 5 min UEs/LEs Some mild increase in L knee pain  Rechecked positional testing  No BPPV or symptoms   In corner feet together eyes open: Static balance x30 Head turns 2x30 Head nods 2x30 Limited due to knee pain  States It will just go out Multi minor LOBs  Seated: SLR 2x10 Clamshell green TB 2x10  Some nausea before and during performing seated clamshells. Vitals as follows: 140/79, 64 bpm     PATIENT EDUCATION: Education details: assessment details, PT POC details, recommend use of RW to support left knee Person educated: Patient and Child(ren) Education method: Explanation Education comprehension: verbalized understanding  HOME EXERCISE PROGRAM: Access Code: [email or web address] URL: [email or web address] Date: 09/12/2023 Prepared by: Casilda Pickerill April Earnie Starring  Exercises - Corner Balance Feet Together With Eyes Open  - 1 x daily - 7 x weekly - 2 sets - 30 sec hold - Corner Balance Feet Together: Eyes Open With Head Turns  - 1 x daily - 7 x weekly - 2 sets - 10 reps - Seated Active Straight-Leg Raise  - 1 x daily - 7 x weekly - 2 sets - 10 reps - Seated Hip Abduction with Resistance  - 1 x daily - 7 x weekly - 2 sets - 10 reps   VESTIBULAR ASSESSMENT   GENERAL OBSERVATION: pt wears bifocals   OCULOMOTOR EXAM: Ocular Alignment: Cover/Uncover Test: WNL Cover/Cross Cover Test: WNL   Ocular ROM: No Limitations   Spontaneous Nystagmus: absent   Gaze-Induced Nystagmus: absent   Smooth Pursuits: intact   Saccades: intact   Convergence/Divergence: 16 cm d/t L eye     VESTIBULAR - OCULAR REFLEX:    Slow VOR: Normal and Comment: c/o blurriness    VOR Cancellation: Comment: segmental head movement; difficult to discern if d/t neck or visual    Head-Impulse Test: HIT Right: covert HIT Left: negative    AUDITORY SCREEN:  Rinne Test WNL B    POSITIONAL TESTING:  Sit>supine: no dizziness  Left Roll Test: negative, pt c/o nausea  C/o nausea and pt  requested to sit up after positional test  155/90 mmHg 97% spO2, 65bpm    Sitting head turns to targets 20: no dizziness Sitting head nods to targets 20: no dizziness Sitting horizontal VOR 30: no dizziness; difficulty maintaining consistent amplitude  Sitting vertical VOR 30: no dizziness; c/o some blurriness in the L eye; difficulty maintaining consistent amplitude   PATIENT EDUCATION: Education details: [facility] as he only have [facility] approved  Person educated: Patient Education method: Explanation Education comprehension: verbalized understanding    Note: Objective measures were completed at Evaluation unless otherwise noted.  DIAGNOSTIC FINDINGS: MRI brain showed scattered subcortical infarcts in the left frontal, lateral cortex, left basal ganglia and right cerebellum.     Non-invasive angiography showed vertebral artery stenoses without concomitant infarct.  Echocardiogram at OSH showed normal EF, no cardiogenic source.  COGNITION: Overall cognitive status: reports short term memory issues   POSTURE: rounded shoulders and forward head  LOWER EXTREMITY ROM:     Active  Right Eval Left Eval  Hip flexion    Hip extension    Hip abduction    Hip adduction    Hip internal rotation    Hip external rotation    Knee flexion 0 -5  Knee extension    Ankle dorsiflexion 12 12  Ankle plantarflexion    Ankle inversion  Ankle eversion     (Blank rows = not tested)  LOWER EXTREMITY MMT:   BLE 4/5 gross strength to resisted tests w/ discomfort to left knee resistance  BED MOBILITY:  Not tested--reports independence w/ grab bar  TRANSFERS: Sit to stand: Complete Independence  Assistive device utilized: None     Stand to sit: Modified independence  Assistive device utilized: None     Chair to chair: Modified independence  Assistive device utilized: None       RAMP:  Not tested  CURB:  Findings:  SBA-CGA  STAIRS: Findings: Comments: step-to w/ HR and supervision GAIT: Findings: Distance walked: clinic and Comments: LLE antalgic, decreased stride length/speed -Supervision level  FUNCTIONAL TESTS:  5 times sit to stand: 59 sec Timed up and go (TUG): 38 sec w/ cane 10 meter walk test: 29.78 sec = 0.75 ft/sec Berg Balance: 40/56      GOALS: Goals reviewed with patient? Yes  SHORT TERM GOALS: Target date: 09/26/2023    Patient will be independent in HEP to improve functional outcomes Baseline: Goal status: IN PROGRESS  2.  Demo improved BLE strength and reduced risk for falls per time 30 sec 5xSTS test Baseline: 59 sec Goal status: IN PROGRESS  3.  Demo improved mobility and reduced risk for falls by improved time to 25 sec TUG test Baseline: 38 sec w/ cane Goal status: IN PROGRESS  4.  Report/demo improved transfers by ability to transfer from his recliner at home w/out LOB  Baseline: reports difficulty w/ instances of falling backwards  Goal status: IN PROGRESS  LONG TERM GOALS: Target date: 10/24/2023    Demo low risk for falls per score 49/56 Berg Balance Test to improve safety with ADL Baseline: 40/56 Goal status: IN PROGRESS  2.  Demo improved mobility and reduced risk for falls by improved time to 13.5 sec TUG test Baseline: 38 sec w/ cane Goal status: IN PROGRESS  3.  Demo improved BLE strength and reduced risk for falls per time 20 sec 5xSTS test Baseline: 59 sec Goal status: IN PROGRESS  4.  Ambulation over various surfaces w/ curbs w/ least restrictive AD w/ modified independence to improve safety in community Baseline: CGA Goal status: IN PROGRESS  5.  Increase gait speed to 2.0 ft/sec to improve efficiency of community mobility Baseline: 0.75 ft/sec w/ cane Goal status: IN PROGRESS   ASSESSMENT:  CLINICAL IMPRESSION: Rechecked canalith testing with pt not demonstrating any symptoms. Focused on initiating HEP. Session truncated due to pt  late arrival. Balance remains affected by pt's L knee pain/arthritis (difficulty weight shifting on it/tolerating weight). Added some quad and hip strengtheners. Rest breaks due to pt experiencing some nausea while performing seated exercises. Vitals did not appear grossly abnormal. Pt leaves clinic without nausea.   OBJECTIVE IMPAIRMENTS: Abnormal gait, decreased activity tolerance, decreased balance, decreased endurance, decreased knowledge of use of DME, difficulty walking, decreased strength, impaired flexibility, and pain.   ACTIVITY LIMITATIONS: carrying, lifting, bending, standing, stairs, transfers, reach over head, and locomotion level  PARTICIPATION LIMITATIONS: meal prep, cleaning, laundry, interpersonal relationship, driving, shopping, community activity, and yard work  PERSONAL FACTORS: Age, Time since onset of injury/illness/exacerbation, and 3+ comorbidities: PMH are also affecting patient's functional outcome.   REHAB POTENTIAL: Good  CLINICAL DECISION MAKING: Evolving/moderate complexity  EVALUATION COMPLEXITY: Moderate  PLAN:  PT FREQUENCY: 2x/week  PT DURATION: other: 7 weeks  PLANNED INTERVENTIONS: 97750- Physical Performance Testing, 97110-Therapeutic exercises, 97530- Therapeutic activity, [id number]- Neuromuscular re-education, 97535- Self Care,  02859- Manual therapy, [id number]- Gait training, 04007- Canalith repositioning, 02886- Aquatic Therapy, [phone]- Electrical stimulation (unattended), and 20560 (1-2 muscles), 20561 (3+ muscles)- Dry Needling  PLAN FOR NEXT SESSION: Continue HEP working on functional, balance, knee strengthening    Dayshaun Whobrey April Ma L Quinter, PT, DPT 09/12/23 8:12 AM  [facility] [address] [place], [address] [place], [place] [address] Phone # [phone] Fax # [phone]

## 2023-09-14 ENCOUNTER — Ambulatory Visit (INDEPENDENT_AMBULATORY_CARE_PROVIDER_SITE_OTHER)

## 2023-09-14 DIAGNOSIS — I5031 Acute diastolic (congestive) heart failure: Secondary | ICD-10-CM

## 2023-09-17 ENCOUNTER — Ambulatory Visit: Admitting: Physical Therapy

## 2023-09-17 ENCOUNTER — Encounter

## 2023-09-17 ENCOUNTER — Ambulatory Visit: Attending: Family Medicine

## 2023-09-17 DIAGNOSIS — R2681 Unsteadiness on feet: Secondary | ICD-10-CM | POA: Insufficient documentation

## 2023-09-17 DIAGNOSIS — R29898 Other symptoms and signs involving the musculoskeletal system: Secondary | ICD-10-CM | POA: Insufficient documentation

## 2023-09-17 DIAGNOSIS — R262 Difficulty in walking, not elsewhere classified: Secondary | ICD-10-CM | POA: Insufficient documentation

## 2023-09-17 DIAGNOSIS — M6281 Muscle weakness (generalized): Secondary | ICD-10-CM | POA: Insufficient documentation

## 2023-09-17 LAB — CUP PACEART REMOTE DEVICE CHECK
Date Time Interrogation Session: 20250801102040
Implantable Pulse Generator Implant Date: 20250630

## 2023-09-19 ENCOUNTER — Ambulatory Visit

## 2023-09-19 DIAGNOSIS — M6281 Muscle weakness (generalized): Secondary | ICD-10-CM

## 2023-09-19 DIAGNOSIS — R2681 Unsteadiness on feet: Secondary | ICD-10-CM | POA: Diagnosis present

## 2023-09-19 DIAGNOSIS — R262 Difficulty in walking, not elsewhere classified: Secondary | ICD-10-CM

## 2023-09-19 DIAGNOSIS — R29898 Other symptoms and signs involving the musculoskeletal system: Secondary | ICD-10-CM

## 2023-09-19 NOTE — Therapy (Signed)
 OUTPATIENT PHYSICAL THERAPY NEURO TREATMENT   Patient Name: Terry Lyons MRN: [id number] DOB:01/07/1946, 78 y.o., male Today's Date: 09/19/2023   PCP: [name], MD REFERRING PROVIDER: [name], MD/// [name], MD  END OF SESSION:  PT End of Session - 09/19/23 1149     Visit Number 4    Number of Visits 14    Date for PT Re-Evaluation 10/24/23    Authorization Type [facility]    PT Start Time 1146    PT Stop Time 1230    PT Time Calculation (min) 44 min    Activity Tolerance Patient tolerated treatment well   nausea   Behavior During Therapy WFL for tasks assessed/performed            Past Medical History:  Diagnosis Date   Allergy    Arthritis    CHF (congestive heart failure) (HCC)    hospitalized in 2017; was diagnosed with an acute excacerbation.    CKD stage 3a, GFR 45-59 ml/min (HCC)    Colon polyps    adenomatous   Diabetes (HCC)    Diabetes mellitus    GERD (gastroesophageal reflux disease)    Gout    History of basal cell carcinoma    W-S Derm   History of squamous cell carcinoma    Hyperlipidemia    Hypertension    Obesity    Rosacea    Tachycardia    Past Surgical History:  Procedure Laterality Date   colonoscopy with polypectomy     X3   ESOPHAGOGASTRODUODENOSCOPY N/A 03/10/2015   Procedure: ESOPHAGOGASTRODUODENOSCOPY (EGD);  Surgeon: [name], MD;  Location: [facility];  Service: Endoscopy;  Laterality: N/A;   esophagus dilated     X3   EUS N/A 05/20/2015   Procedure: UPPER ENDOSCOPIC ULTRASOUND (EUS) RADIAL;  Surgeon: [name], MD;  Location: [facility];  Service: Endoscopy;  Laterality: N/A;   KNEE ARTHROSCOPY     bilaterally   LOOP RECORDER INSERTION N/A 08/13/2023   Procedure: LOOP RECORDER INSERTION;  Surgeon: [name], MD;  Location: [facility] INVASIVE CV LAB;  Service: Cardiovascular;  Laterality: N/A;   PILONIDAL CYST EXCISION     REPLACEMENT TOTAL KNEE Right    R   SHOULDER SURGERY     right shoulder    TONSILLECTOMY     total ethmoidectomy & sphenoidectomiy Bilateral 05/27/13   also reduction of turbinates   TOTAL KNEE ARTHROPLASTY     RIGHT   TOTAL SHOULDER ARTHROPLASTY Left 10/04/2017   Procedure: LEFT TOTAL SHOULDER ARTHROPLASTY;  Surgeon: [name], MD;  Location: [facility];  Service: Orthopedics;  Laterality: Left;   Patient Active Problem List   Diagnosis Date Noted   Class 3 obesity 08/12/2023   Acute CVA (cerebrovascular accident) (HCC) 08/11/2023   CKD stage 3a, GFR 45-59 ml/min (HCC) 08/11/2023   Insomnia 08/11/2023   Acute metabolic encephalopathy 01/17/2021   Encephalopathy 01/16/2021   History of squamous cell carcinoma    Status post total shoulder arthroplasty 10/04/2017   Pain in left knee 06/08/2017   Osteoarthritis of glenohumeral joint 05/21/2017   Esophageal candidiasis (HCC) 03/13/2015   Acute diastolic CHF (congestive heart failure) (HCC) 03/13/2015   Sinus tachycardia 03/10/2015   Hypothyroidism 03/10/2015   Hypoxemia    Uncontrolled diabetes mellitus type 2 without complications 03/07/2015   Chest pain on breathing    Intractable nausea and vomiting 03/06/2015   Abdominal pain 03/06/2015   Severe obesity (BMI >= 40) (HCC) 05/21/2013  Chronic sphenoidal sinusitis 05/21/2013   Hypokalemia 05/21/2013   Sinusitis, chronic 12/19/2012   History of anesthesia reaction 03/06/2012   Testosterone  deficiency 03/06/2012   Type II diabetes mellitus (HCC) 12/26/2010   FATIGUE 04/26/2010   EDEMA 04/26/2010   PAROXYSMAL NOCTURNAL DYSPNEA 04/26/2010   ESOPHAGEAL STRICTURE 07/17/2008   FATTY LIVER DISEASE 07/17/2008   Esophageal dysphagia 07/17/2008   Essential hypertension, benign 07/02/2008   Angioedema 06/08/2008   UNSPECIFIED ANEMIA 05/25/2008   URTICARIA 05/25/2008   HYPONATREMIA 05/03/2008   Disorder resulting from impaired renal function 05/03/2008   Hyperlipidemia 11/05/2007   NONSPEC ELEVATION OF LEVELS OF TRANSAMINASE/LDH 11/05/2007   SKIN CANCER, HX  OF 11/05/2007   GOUT, UNSPECIFIED 02/05/2007   DYSMETABOLIC SYNDROME X 02/05/2007   GERD 02/05/2007    ONSET DATE: June 2025  REFERRING DIAG:  I63.9 (ICD-10-CM) - Acute CVA (cerebrovascular accident) (HCC)    THERAPY DIAG:  Difficulty in walking, not elsewhere classified  Muscle weakness (generalized)  Unsteadiness on feet  Other symptoms and signs involving the musculoskeletal system  Rationale for Evaluation and Treatment: Rehabilitation  SUBJECTIVE:                                                                                                                                                                                             SUBJECTIVE STATEMENT: Have a couple almost trips/falls. Left knee is bothersome when standing/walking. Some dizziness/imbalance with fast turns  Pt accompanied by: self   PERTINENT HISTORY: 78 y.o. M with MO, CKD, hypothyroidism, HTN and DM presented with acute vision changes to OSH. MRI brain there showed scattered subcortical (embolic) infarcts in left frontal and lateral cortex, left BG and right cerebellum. Transferred to [facility] for Neurology consultation. R TKA 15 years ago  PAIN:  Are you having pain? Yes: NPRS scale: 4/10 Pain location: L knee Pain description: pt did not report Aggravating factors: pt did not report Relieving factors: sleeve, tylenol    PRECAUTIONS: Fall  RED FLAGS: None   WEIGHT BEARING RESTRICTIONS: No  FALLS: Has patient fallen in last 6 months? No  LIVING ENVIRONMENT: Lives with: lives with their family Lives in: House/apartment-one-level house Stairs: 4 steps to enter Has following equipment at home: Single point cane, Environmental consultant - 4 wheeled, shower chair, and Grab bars  PLOF: Independent  PATIENT GOALS: improve balance, improve transfers  OBJECTIVE:   TODAY'S TREATMENT: 09/19/23 Activity Comments  Biofreeze applied left knee   LAQ 3x10 3#  Seated hamstring curls 3x10 green  Seated clamshells 3x10  green  Static multisensory balance On foam, feet apart: EO/EC x 30 sec. Head movements EO/EC x 30 sec  Dynamic balance Sidestep x 2 min Tandem forward/retrowalk x 2 min  NU-step level 4 x 8 min Instances of left knee discomfort                                                                                                                                TREATMENT DATE:  09/19/2023 Activity Comments  Nustep L4 x 5 min UEs/LEs Some mild increase in L knee pain  Rechecked positional testing  No BPPV or symptoms   In corner feet together eyes open: Static balance x30 Head turns 2x30 Head nods 2x30 Limited due to knee pain  States It will just go out Multi minor LOBs  Seated: SLR 2x10 Clamshell green TB 2x10  Some nausea before and during performing seated clamshells. Vitals as follows: 140/79, 64 bpm     PATIENT EDUCATION: Education details: assessment details, PT POC details, recommend use of RW to support left knee Person educated: Patient and Child(ren) Education method: Explanation Education comprehension: verbalized understanding  HOME EXERCISE PROGRAM: Access Code: [email or web address] URL: [email or web address] Date: 09/12/2023 Prepared by: Gellen April Earnie Starring  Exercises - Corner Balance Feet Together With Eyes Open  - 1 x daily - 7 x weekly - 2 sets - 30 sec hold - Corner Balance Feet Together: Eyes Open With Head Turns  - 1 x daily - 7 x weekly - 2 sets - 10 reps - Seated Active Straight-Leg Raise  - 1 x daily - 7 x weekly - 2 sets - 10 reps - Seated Hip Abduction with Resistance  - 1 x daily - 7 x weekly - 2 sets - 10 reps   VESTIBULAR ASSESSMENT   GENERAL OBSERVATION: pt wears bifocals   OCULOMOTOR EXAM: Ocular Alignment: Cover/Uncover Test: WNL Cover/Cross Cover Test: WNL   Ocular ROM: No Limitations   Spontaneous Nystagmus: absent   Gaze-Induced Nystagmus: absent   Smooth Pursuits: intact   Saccades: intact   Convergence/Divergence: 16 cm d/t L  eye     VESTIBULAR - OCULAR REFLEX:    Slow VOR: Normal and Comment: c/o blurriness    VOR Cancellation: Comment: segmental head movement; difficult to discern if d/t neck or visual    Head-Impulse Test: HIT Right: covert HIT Left: negative    AUDITORY SCREEN:  Rinne Test WNL B    POSITIONAL TESTING:  Sit>supine: no dizziness  Left Roll Test: negative, pt c/o nausea  C/o nausea and pt requested to sit up after positional test  155/90 mmHg 97% spO2, 65bpm    Sitting head turns to targets 20: no dizziness Sitting head nods to targets 20: no dizziness Sitting horizontal VOR 30: no dizziness; difficulty maintaining consistent amplitude  Sitting vertical VOR 30: no dizziness; c/o some blurriness in the L eye; difficulty maintaining consistent amplitude   PATIENT EDUCATION: Education details: [facility] as he only have [facility] approved  Person educated: Patient Education method: Explanation  Education comprehension: verbalized understanding    Note: Objective measures were completed at Evaluation unless otherwise noted.  DIAGNOSTIC FINDINGS: MRI brain showed scattered subcortical infarcts in the left frontal, lateral cortex, left basal ganglia and right cerebellum.     Non-invasive angiography showed vertebral artery stenoses without concomitant infarct.  Echocardiogram at OSH showed normal EF, no cardiogenic source.  COGNITION: Overall cognitive status: reports short term memory issues   POSTURE: rounded shoulders and forward head  LOWER EXTREMITY ROM:     Active  Right Eval Left Eval  Hip flexion    Hip extension    Hip abduction    Hip adduction    Hip internal rotation    Hip external rotation    Knee flexion 0 -5  Knee extension    Ankle dorsiflexion 12 12  Ankle plantarflexion    Ankle inversion    Ankle eversion     (Blank rows = not tested)  LOWER EXTREMITY MMT:   BLE 4/5 gross strength to resisted tests w/  discomfort to left knee resistance  BED MOBILITY:  Not tested--reports independence w/ grab bar  TRANSFERS: Sit to stand: Complete Independence  Assistive device utilized: None     Stand to sit: Modified independence  Assistive device utilized: None     Chair to chair: Modified independence  Assistive device utilized: None       RAMP:  Not tested  CURB:  Findings: SBA-CGA  STAIRS: Findings: Comments: step-to w/ HR and supervision GAIT: Findings: Distance walked: clinic and Comments: LLE antalgic, decreased stride length/speed -Supervision level  FUNCTIONAL TESTS:  5 times sit to stand: 59 sec Timed up and go (TUG): 38 sec w/ cane 10 meter walk test: 29.78 sec = 0.75 ft/sec Berg Balance: 40/56      GOALS: Goals reviewed with patient? Yes  SHORT TERM GOALS: Target date: 09/26/2023    Patient will be independent in HEP to improve functional outcomes Baseline: Goal status: IN PROGRESS  2.  Demo improved BLE strength and reduced risk for falls per time 30 sec 5xSTS test Baseline: 59 sec Goal status: IN PROGRESS  3.  Demo improved mobility and reduced risk for falls by improved time to 25 sec TUG test Baseline: 38 sec w/ cane Goal status: IN PROGRESS  4.  Report/demo improved transfers by ability to transfer from his recliner at home w/out LOB  Baseline: reports difficulty w/ instances of falling backwards  Goal status: IN PROGRESS  LONG TERM GOALS: Target date: 10/24/2023    Demo low risk for falls per score 49/56 Berg Balance Test to improve safety with ADL Baseline: 40/56 Goal status: IN PROGRESS  2.  Demo improved mobility and reduced risk for falls by improved time to 13.5 sec TUG test Baseline: 38 sec w/ cane Goal status: IN PROGRESS  3.  Demo improved BLE strength and reduced risk for falls per time 20 sec 5xSTS test Baseline: 59 sec Goal status: IN PROGRESS  4.  Ambulation over various surfaces w/ curbs w/ least restrictive AD w/ modified  independence to improve safety in community Baseline: CGA Goal status: IN PROGRESS  5.  Increase gait speed to 2.0 ft/sec to improve efficiency of community mobility Baseline: 0.75 ft/sec w/ cane Goal status: IN PROGRESS   ASSESSMENT:  CLINICAL IMPRESSION: Notes ongoing issues with left knee limiting standing/walking. Notes dizziness is sporadic and unexpected.  Initiated with seated open chain strength for BLE to improve activity tolernace and improve knee control. Static multisensory balance to  facilitate proprioceptive awareness with good tolerance and mild-mod sway with compliant surface and eyes closed.  Dynamic balance to improve single limb support and facilitate righting reactions. NU-step for cardiovascular exercise for improved endurance/activity tolernace.   OBJECTIVE IMPAIRMENTS: Abnormal gait, decreased activity tolerance, decreased balance, decreased endurance, decreased knowledge of use of DME, difficulty walking, decreased strength, impaired flexibility, and pain.   ACTIVITY LIMITATIONS: carrying, lifting, bending, standing, stairs, transfers, reach over head, and locomotion level  PARTICIPATION LIMITATIONS: meal prep, cleaning, laundry, interpersonal relationship, driving, shopping, community activity, and yard work  PERSONAL FACTORS: Age, Time since onset of injury/illness/exacerbation, and 3+ comorbidities: PMH are also affecting patient's functional outcome.   REHAB POTENTIAL: Good  CLINICAL DECISION MAKING: Evolving/moderate complexity  EVALUATION COMPLEXITY: Moderate  PLAN:  PT FREQUENCY: 2x/week  PT DURATION: other: 7 weeks  PLANNED INTERVENTIONS: 97750- Physical Performance Testing, 97110-Therapeutic exercises, 97530- Therapeutic activity, 97112- Neuromuscular re-education, 97535- Self Care, [id number]- Manual therapy, [phone]- Gait training, [phone]- Canalith repositioning, [id number]- Aquatic Therapy, [phone]- Electrical stimulation (unattended), and 20560 (1-2 muscles),  20561 (3+ muscles)- Dry Needling  PLAN FOR NEXT SESSION: Continue HEP working on functional, balance, knee strengthening    Jonette MARLA Sandifer, PT, DPT 09/19/23 11:49 AM  [facility] at [facility] [address] [place], [place] [address] Phone # [phone] Fax # [phone]

## 2023-09-20 ENCOUNTER — Ambulatory Visit: Payer: Self-pay | Admitting: Cardiology

## 2023-09-24 ENCOUNTER — Ambulatory Visit

## 2023-09-24 DIAGNOSIS — R262 Difficulty in walking, not elsewhere classified: Secondary | ICD-10-CM

## 2023-09-24 DIAGNOSIS — R29898 Other symptoms and signs involving the musculoskeletal system: Secondary | ICD-10-CM

## 2023-09-24 DIAGNOSIS — R2681 Unsteadiness on feet: Secondary | ICD-10-CM

## 2023-09-24 DIAGNOSIS — M6281 Muscle weakness (generalized): Secondary | ICD-10-CM

## 2023-09-24 NOTE — Therapy (Signed)
 OUTPATIENT PHYSICAL THERAPY NEURO TREATMENT   Patient Name: Terry Lyons MRN: [id number] DOB:22-Sep-1945, 78 y.o., male Today's Date: 09/24/2023   PCP: [name], MD REFERRING PROVIDER: [name], MD/// [name], MD  END OF SESSION:  PT End of Session - 09/24/23 1018     Visit Number 5    Number of Visits 14    Date for PT Re-Evaluation 10/24/23    [facility]ization Type [facility]    PT Start Time 1018    PT Stop Time 1100    PT Time Calculation (min) 42 min    Activity Tolerance Patient tolerated treatment well   nausea   Behavior During Therapy WFL for tasks assessed/performed            Past Medical History:  Diagnosis Date   Allergy    Arthritis    CHF (congestive heart failure) (HCC)    hospitalized in 2017; was diagnosed with an acute excacerbation.    CKD stage 3a, GFR 45-59 ml/min (HCC)    Colon polyps    adenomatous   Diabetes (HCC)    Diabetes mellitus    GERD (gastroesophageal reflux disease)    Gout    History of basal cell carcinoma    W-S Derm   History of squamous cell carcinoma    Hyperlipidemia    Hypertension    Obesity    Rosacea    Tachycardia    Past Surgical History:  Procedure Laterality Date   colonoscopy with polypectomy     X3   ESOPHAGOGASTRODUODENOSCOPY N/A 03/10/2015   Procedure: ESOPHAGOGASTRODUODENOSCOPY (EGD);  Surgeon: [name], MD;  Location: [facility];  Service: Endoscopy;  Laterality: N/A;   esophagus dilated     X3   EUS N/A 05/20/2015   Procedure: UPPER ENDOSCOPIC ULTRASOUND (EUS) RADIAL;  Surgeon: [name], MD;  Location: [facility];  Service: Endoscopy;  Laterality: N/A;   KNEE ARTHROSCOPY     bilaterally   LOOP RECORDER INSERTION N/A 08/13/2023   Procedure: LOOP RECORDER INSERTION;  Surgeon: [name], MD;  Location: [facility] INVASIVE CV LAB;  Service: Cardiovascular;  Laterality: N/A;   PILONIDAL CYST EXCISION     REPLACEMENT TOTAL KNEE Right    R   SHOULDER SURGERY     right shoulder    TONSILLECTOMY     total ethmoidectomy & sphenoidectomiy Bilateral 05/27/13   also reduction of turbinates   TOTAL KNEE ARTHROPLASTY     RIGHT   TOTAL SHOULDER ARTHROPLASTY Left 10/04/2017   Procedure: LEFT TOTAL SHOULDER ARTHROPLASTY;  Surgeon: [name], MD;  Location: [facility];  Service: Orthopedics;  Laterality: Left;   Patient Active Problem List   Diagnosis Date Noted   Class 3 obesity 08/12/2023   Acute CVA (cerebrovascular accident) (HCC) 08/11/2023   CKD stage 3a, GFR 45-59 ml/min (HCC) 08/11/2023   Insomnia 08/11/2023   Acute metabolic encephalopathy 01/17/2021   Encephalopathy 01/16/2021   History of squamous cell carcinoma    Status post total shoulder arthroplasty 10/04/2017   Pain in left knee 06/08/2017   Osteoarthritis of glenohumeral joint 05/21/2017   Esophageal candidiasis (HCC) 03/13/2015   Acute diastolic CHF (congestive heart failure) (HCC) 03/13/2015   Sinus tachycardia 03/10/2015   Hypothyroidism 03/10/2015   Hypoxemia    Uncontrolled diabetes mellitus type 2 without complications 03/07/2015   Chest pain on breathing    Intractable nausea and vomiting 03/06/2015   Abdominal pain 03/06/2015   Severe obesity (BMI >= 40) (HCC) 05/21/2013  Chronic sphenoidal sinusitis 05/21/2013   Hypokalemia 05/21/2013   Sinusitis, chronic 12/19/2012   History of anesthesia reaction 03/06/2012   Testosterone  deficiency 03/06/2012   Type II diabetes mellitus (HCC) 12/26/2010   FATIGUE 04/26/2010   EDEMA 04/26/2010   PAROXYSMAL NOCTURNAL DYSPNEA 04/26/2010   ESOPHAGEAL STRICTURE 07/17/2008   FATTY LIVER DISEASE 07/17/2008   Esophageal dysphagia 07/17/2008   Essential hypertension, benign 07/02/2008   Angioedema 06/08/2008   UNSPECIFIED ANEMIA 05/25/2008   URTICARIA 05/25/2008   HYPONATREMIA 05/03/2008   Disorder resulting from impaired renal function 05/03/2008   Hyperlipidemia 11/05/2007   NONSPEC ELEVATION OF LEVELS OF TRANSAMINASE/LDH 11/05/2007   SKIN CANCER, HX  OF 11/05/2007   GOUT, UNSPECIFIED 02/05/2007   DYSMETABOLIC SYNDROME X 02/05/2007   GERD 02/05/2007    ONSET DATE: June 2025  REFERRING DIAG:  I63.9 (ICD-10-CM) - Acute CVA (cerebrovascular accident) (HCC)    THERAPY DIAG:  Difficulty in walking, not elsewhere classified  Muscle weakness (generalized)  Unsteadiness on feet  Other symptoms and signs involving the musculoskeletal system  Rationale for Evaluation and Treatment: Rehabilitation  SUBJECTIVE:                                                                                                                                                                                             SUBJECTIVE STATEMENT: Doing ok, no issues after last session  Pt accompanied by: self   PERTINENT HISTORY: 78 y.o. M with MO, CKD, hypothyroidism, HTN and DM presented with acute vision changes to OSH. MRI brain there showed scattered subcortical (embolic) infarcts in left frontal and lateral cortex, left BG and right cerebellum. Transferred to [facility] for Neurology consultation. R TKA 15 years ago  PAIN:  Are you having pain? Yes: NPRS scale: 2-3/10 Pain location: L knee Pain description: pt did not report Aggravating factors: pt did not report Relieving factors: sleeve, tylenol    PRECAUTIONS: Fall  RED FLAGS: None   WEIGHT BEARING RESTRICTIONS: No  FALLS: Has patient fallen in last 6 months? No  LIVING ENVIRONMENT: Lives with: lives with their family Lives in: House/apartment-one-level house Stairs: 4 steps to enter Has following equipment at home: Single point cane, Environmental consultant - 4 wheeled, shower chair, and Grab bars  PLOF: Independent  PATIENT GOALS: improve balance, improve transfers  OBJECTIVE:   TODAY'S TREATMENT: 09/24/23 Activity Comments  LAQ 4x10 4#  Seated elephant march 3x10 4#  Sidestep x 2 min 4# along counter  Standing hip abd 3x10 4#, cues for LLE SLS support  Seated hamstring curls 3x10 15#  Seated rows 3x10    Static standing multisensory  TODAY'S TREATMENT: 09/19/23 Activity Comments  Biofreeze applied left knee   LAQ 3x10 3#  Seated hamstring curls 3x10 green  Seated clamshells 3x10 green  Static multisensory balance On foam, feet apart: EO/EC x 30 sec. Head movements EO/EC x 30 sec  Dynamic balance Sidestep x 2 min Tandem forward/retrowalk x 2 min  NU-step level 4 x 8 min Instances of left knee discomfort      PATIENT EDUCATION: Education details: assessment details, PT POC details, recommend use of RW to support left knee Person educated: Patient and Child(ren) Education method: Explanation Education comprehension: verbalized understanding  HOME EXERCISE PROGRAM: Access Code: [email or web address] URL: [email or web address] Date: 09/12/2023 Prepared by: Gellen April Earnie Starring  Exercises - Corner Balance Feet Together With Eyes Open  - 1 x daily - 7 x weekly - 2 sets - 30 sec hold - Corner Balance Feet Together: Eyes Open With Head Turns  - 1 x daily - 7 x weekly - 2 sets - 10 reps - Seated Active Straight-Leg Raise  - 1 x daily - 7 x weekly - 2 sets - 10 reps - Seated Hip Abduction with Resistance  - 1 x daily - 7 x weekly - 2 sets - 10 reps - Standing Hip Abduction with Counter Support  - 1 x daily - 7 x weekly - 3 sets - 10 reps   VESTIBULAR ASSESSMENT   GENERAL OBSERVATION: pt wears bifocals   OCULOMOTOR EXAM: Ocular Alignment: Cover/Uncover Test: WNL Cover/Cross Cover Test: WNL   Ocular ROM: No Limitations   Spontaneous Nystagmus: absent   Gaze-Induced Nystagmus: absent   Smooth Pursuits: intact   Saccades: intact   Convergence/Divergence: 16 cm d/t L eye     VESTIBULAR - OCULAR REFLEX:    Slow VOR: Normal and Comment: c/o blurriness    VOR Cancellation: Comment: segmental head movement; difficult to discern if d/t neck or visual    Head-Impulse Test: HIT Right: covert HIT Left: negative    AUDITORY SCREEN:  Rinne Test WNL B    POSITIONAL  TESTING:  Sit>supine: no dizziness  Left Roll Test: negative, pt c/o nausea  C/o nausea and pt requested to sit up after positional test  155/90 mmHg 97% spO2, 65bpm    Sitting head turns to targets 20: no dizziness Sitting head nods to targets 20: no dizziness Sitting horizontal VOR 30: no dizziness; difficulty maintaining consistent amplitude  Sitting vertical VOR 30: no dizziness; c/o some blurriness in the L eye; difficulty maintaining consistent amplitude   PATIENT EDUCATION: Education details: [facility] as he only have [facility]ST approved  Person educated: Patient Education method: Explanation Education comprehension: verbalized understanding    Note: Objective measures were completed at Evaluation unless otherwise noted.  DIAGNOSTIC FINDINGS: MRI brain showed scattered subcortical infarcts in the left frontal, lateral cortex, left basal ganglia and right cerebellum.     Non-invasive angiography showed vertebral artery stenoses without concomitant infarct.  Echocardiogram at OSH showed normal EF, no cardiogenic source.  COGNITION: Overall cognitive status: reports short term memory issues   POSTURE: rounded shoulders and forward head  LOWER EXTREMITY ROM:     Active  Right Eval Left Eval  Hip flexion    Hip extension    Hip abduction    Hip adduction    Hip internal rotation    Hip external rotation    Knee flexion 0 -5  Knee extension    Ankle dorsiflexion 12 12  Ankle plantarflexion  Ankle inversion    Ankle eversion     (Blank rows = not tested)  LOWER EXTREMITY MMT:   BLE 4/5 gross strength to resisted tests w/ discomfort to left knee resistance  BED MOBILITY:  Not tested--reports independence w/ grab bar  TRANSFERS: Sit to stand: Complete Independence  Assistive device utilized: None     Stand to sit: Modified independence  Assistive device utilized: None     Chair to chair: Modified independence  Assistive  device utilized: None       RAMP:  Not tested  CURB:  Findings: SBA-CGA  STAIRS: Findings: Comments: step-to w/ HR and supervision GAIT: Findings: Distance walked: clinic and Comments: LLE antalgic, decreased stride length/speed -Supervision level  FUNCTIONAL TESTS:  5 times sit to stand: 59 sec Timed up and go (TUG): 38 sec w/ cane 10 meter walk test: 29.78 sec = 0.75 ft/sec Berg Balance: 40/56      GOALS: Goals reviewed with patient? Yes  SHORT TERM GOALS: Target date: 09/26/2023    Patient will be independent in HEP to improve functional outcomes Baseline: Goal status: IN PROGRESS  2.  Demo improved BLE strength and reduced risk for falls per time 30 sec 5xSTS test Baseline: 59 sec Goal status: IN PROGRESS  3.  Demo improved mobility and reduced risk for falls by improved time to 25 sec TUG test Baseline: 38 sec w/ cane Goal status: IN PROGRESS  4.  Report/demo improved transfers by ability to transfer from his recliner at home w/out LOB  Baseline: reports difficulty w/ instances of falling backwards  Goal status: IN PROGRESS  LONG TERM GOALS: Target date: 10/24/2023    Demo low risk for falls per score 49/56 Berg Balance Test to improve safety with ADL Baseline: 40/56 Goal status: IN PROGRESS  2.  Demo improved mobility and reduced risk for falls by improved time to 13.5 sec TUG test Baseline: 38 sec w/ cane Goal status: IN PROGRESS  3.  Demo improved BLE strength and reduced risk for falls per time 20 sec 5xSTS test Baseline: 59 sec Goal status: IN PROGRESS  4.  Ambulation over various surfaces w/ curbs w/ least restrictive AD w/ modified independence to improve safety in community Baseline: CGA Goal status: IN PROGRESS  5.  Increase gait speed to 2.0 ft/sec to improve efficiency of community mobility Baseline: 0.75 ft/sec w/ cane Goal status: IN PROGRESS   ASSESSMENT:  CLINICAL IMPRESSION: Continued with strength training for general  conditioning w/ cues in sequence and techniques to improve LLE stability and ROM awareness. Static balance activities to improve postural stability with difficulty to posterior forces with retro-LOB. Continued sessions to progress POC details to improve mobility and activity tolernace  OBJECTIVE IMPAIRMENTS: Abnormal gait, decreased activity tolerance, decreased balance, decreased endurance, decreased knowledge of use of DME, difficulty walking, decreased strength, impaired flexibility, and pain.   ACTIVITY LIMITATIONS: carrying, lifting, bending, standing, stairs, transfers, reach over head, and locomotion level  PARTICIPATION LIMITATIONS: meal prep, cleaning, laundry, interpersonal relationship, driving, shopping, community activity, and yard work  PERSONAL FACTORS: Age, Time since onset of injury/illness/exacerbation, and 3+ comorbidities: PMH are also affecting patient's functional outcome.   REHAB POTENTIAL: Good  CLINICAL DECISION MAKING: Evolving/moderate complexity  EVALUATION COMPLEXITY: Moderate  PLAN:  PT FREQUENCY: 2x/week  PT DURATION: other: 7 weeks  PLANNED INTERVENTIONS: 97750- Physical Performance Testing, 97110-Therapeutic exercises, 97530- Therapeutic activity, [id number]- Neuromuscular re-education, 97535- Self Care, [id number]- Manual therapy, [id number]- Gait training, [phone]- Canalith repositioning, [id number]- Aquatic Therapy, [id number]-  Electrical stimulation (unattended), and 79439 (1-2 muscles), 20561 (3+ muscles)- Dry Needling  PLAN FOR NEXT SESSION: Continue HEP working on functional, balance, knee strengthening    Jonette MARLA Sandifer, PT, DPT 09/24/23 10:19 AM  [facility] [address] [place], [address] [place], [place] [address] Phone # [phone] Fax # [phone]

## 2023-09-27 ENCOUNTER — Ambulatory Visit

## 2023-09-27 DIAGNOSIS — R29898 Other symptoms and signs involving the musculoskeletal system: Secondary | ICD-10-CM

## 2023-09-27 DIAGNOSIS — R262 Difficulty in walking, not elsewhere classified: Secondary | ICD-10-CM | POA: Diagnosis not present

## 2023-09-27 DIAGNOSIS — R2681 Unsteadiness on feet: Secondary | ICD-10-CM

## 2023-09-27 DIAGNOSIS — M6281 Muscle weakness (generalized): Secondary | ICD-10-CM

## 2023-09-27 NOTE — Therapy (Signed)
 OUTPATIENT PHYSICAL THERAPY NEURO TREATMENT   Patient Name: Terry Lyons MRN: [id number] DOB:Nov 17, 1945, 78 y.o., male Today's Date: 09/27/2023   PCP: [name], MD REFERRING PROVIDER: [name], MD/// [name], MD  END OF SESSION:  PT End of Session - 09/27/23 1403     Visit Number 6    Number of Visits 14    Date for PT Re-Evaluation 10/24/23    Authorization Type [facility]    PT Start Time 1402    PT Stop Time 1445    PT Time Calculation (min) 43 min    Activity Tolerance Patient tolerated treatment well   nausea   Behavior During Therapy WFL for tasks assessed/performed            Past Medical History:  Diagnosis Date   Allergy    Arthritis    CHF (congestive heart failure) (HCC)    hospitalized in 2017; was diagnosed with an acute excacerbation.    CKD stage 3a, GFR 45-59 ml/min (HCC)    Colon polyps    adenomatous   Diabetes (HCC)    Diabetes mellitus    GERD (gastroesophageal reflux disease)    Gout    History of basal cell carcinoma    W-S Derm   History of squamous cell carcinoma    Hyperlipidemia    Hypertension    Obesity    Rosacea    Tachycardia    Past Surgical History:  Procedure Laterality Date   colonoscopy with polypectomy     X3   ESOPHAGOGASTRODUODENOSCOPY N/A 03/10/2015   Procedure: ESOPHAGOGASTRODUODENOSCOPY (EGD);  Surgeon: [name], MD;  Location: [facility];  Service: Endoscopy;  Laterality: N/A;   esophagus dilated     X3   EUS N/A 05/20/2015   Procedure: UPPER ENDOSCOPIC ULTRASOUND (EUS) RADIAL;  Surgeon: [name], MD;  Location: [facility];  Service: Endoscopy;  Laterality: N/A;   KNEE ARTHROSCOPY     bilaterally   LOOP RECORDER INSERTION N/A 08/13/2023   Procedure: LOOP RECORDER INSERTION;  Surgeon: [name], MD;  Location: [facility]IVE [facility];  Service: Cardiovascular;  Laterality: N/A;   PILONIDAL CYST EXCISION     REPLACEMENT TOTAL KNEE Right    R   SHOULDER SURGERY     right shoulder    TONSILLECTOMY     total ethmoidectomy & sphenoidectomiy Bilateral 05/27/13   also reduction of turbinates   TOTAL KNEE ARTHROPLASTY     RIGHT   TOTAL SHOULDER ARTHROPLASTY Left 10/04/2017   Procedure: LEFT TOTAL SHOULDER ARTHROPLASTY;  Surgeon: [name], MD;  Location: [facility];  Service: Orthopedics;  Laterality: Left;   Patient Active Problem List   Diagnosis Date Noted   Class 3 obesity 08/12/2023   Acute CVA (cerebrovascular accident) (HCC) 08/11/2023   CKD stage 3a, GFR 45-59 ml/min (HCC) 08/11/2023   Insomnia 08/11/2023   Acute metabolic encephalopathy 01/17/2021   Encephalopathy 01/16/2021   History of squamous cell carcinoma    Status post total shoulder arthroplasty 10/04/2017   Pain in left knee 06/08/2017   Osteoarthritis of glenohumeral joint 05/21/2017   Esophageal candidiasis (HCC) 03/13/2015   Acute diastolic CHF (congestive heart failure) (HCC) 03/13/2015   Sinus tachycardia 03/10/2015   Hypothyroidism 03/10/2015   Hypoxemia    Uncontrolled diabetes mellitus type 2 without complications 03/07/2015   Chest pain on breathing    Intractable nausea and vomiting 03/06/2015   Abdominal pain 03/06/2015   Severe obesity (BMI >= 40) (HCC) 05/21/2013  Chronic sphenoidal sinusitis 05/21/2013   Hypokalemia 05/21/2013   Sinusitis, chronic 12/19/2012   History of anesthesia reaction 03/06/2012   Testosterone  deficiency 03/06/2012   Type II diabetes mellitus (HCC) 12/26/2010   FATIGUE 04/26/2010   EDEMA 04/26/2010   PAROXYSMAL NOCTURNAL DYSPNEA 04/26/2010   ESOPHAGEAL STRICTURE 07/17/2008   FATTY LIVER DISEASE 07/17/2008   Esophageal dysphagia 07/17/2008   Essential hypertension, benign 07/02/2008   Angioedema 06/08/2008   UNSPECIFIED ANEMIA 05/25/2008   URTICARIA 05/25/2008   HYPONATREMIA 05/03/2008   Disorder resulting from impaired renal function 05/03/2008   Hyperlipidemia 11/05/2007   NONSPEC ELEVATION OF LEVELS OF TRANSAMINASE/LDH 11/05/2007   SKIN CANCER, HX  OF 11/05/2007   GOUT, UNSPECIFIED 02/05/2007   DYSMETABOLIC SYNDROME X 02/05/2007   GERD 02/05/2007    ONSET DATE: June 2025  REFERRING DIAG:  I63.9 (ICD-10-CM) - Acute CVA (cerebrovascular accident) (HCC)    THERAPY DIAG:  Difficulty in walking, not elsewhere classified  Muscle weakness (generalized)  Unsteadiness on feet  Other symptoms and signs involving the musculoskeletal system  Rationale for Evaluation and Treatment: Rehabilitation  SUBJECTIVE:                                                                                                                                                                                             SUBJECTIVE STATEMENT: Doing ok, no issues after last session  Pt accompanied by: self   PERTINENT HISTORY: 78 y.o. M with MO, CKD, hypothyroidism, HTN and DM presented with acute vision changes to OSH. MRI brain there showed scattered subcortical (embolic) infarcts in left frontal and lateral cortex, left BG and right cerebellum. Transferred to [facility] for Neurology consultation. R TKA 15 years ago  PAIN:  Are you having pain? Yes: NPRS scale: 0/10 Pain location: L knee Pain description: pt did not report Aggravating factors: pt did not report Relieving factors: sleeve, tylenol    PRECAUTIONS: Fall  RED FLAGS: None   WEIGHT BEARING RESTRICTIONS: No  FALLS: Has patient fallen in last 6 months? No  LIVING ENVIRONMENT: Lives with: lives with their family Lives in: House/apartment-one-level house Stairs: 4 steps to enter Has following equipment at home: Single point cane, Environmental consultant - 4 wheeled, shower chair, and Grab bars  PLOF: Independent  PATIENT GOALS: improve balance, improve transfers  OBJECTIVE:   TODAY'S TREATMENT: 09/27/23 Activity Comments  Forward/retro walk x 2 min Increased left knee discomfort  VOR x 1 x 30 sec No symptoms, good tracking  VOR x 2 x 30 sec Assist for hand coordination, no symptoms, no corrective  saccades  Static multisensory balance Normal-mild sway with eyes closed and head movement  conditions           TODAY'S TREATMENT: 09/24/23 Activity Comments  LAQ 4x10 4#  Seated elephant march 3x10 4#  Sidestep x 2 min 4# along counter  Standing hip abd 3x10 4#, cues for LLE SLS support  Seated hamstring curls 3x10 15#  Seated rows 3x10   Static standing multisensory       TODAY'S TREATMENT: 09/19/23 Activity Comments  Biofreeze applied left knee   LAQ 3x10 3#  Seated hamstring curls 3x10 green  Seated clamshells 3x10 green  Static multisensory balance On foam, feet apart: EO/EC x 30 sec. Head movements EO/EC x 30 sec  Dynamic balance Sidestep x 2 min Tandem forward/retrowalk x 2 min  NU-step level 4 x 8 min Instances of left knee discomfort      PATIENT EDUCATION: Education details: assessment details, PT POC details, recommend use of RW to support left knee Person educated: Patient and Child(ren) Education method: Explanation Education comprehension: verbalized understanding  HOME EXERCISE PROGRAM: Access Code: [email or web address] URL: [email or web address] Date: 09/12/2023 Prepared by: Gellen April Earnie Starring  Exercises - Corner Balance Feet Together With Eyes Open  - 1 x daily - 7 x weekly - 2 sets - 30 sec hold - Corner Balance Feet Together: Eyes Open With Head Turns  - 1 x daily - 7 x weekly - 2 sets - 10 reps - Seated Active Straight-Leg Raise  - 1 x daily - 7 x weekly - 2 sets - 10 reps - Seated Hip Abduction with Resistance  - 1 x daily - 7 x weekly - 2 sets - 10 reps - Standing Hip Abduction with Counter Support  - 1 x daily - 7 x weekly - 3 sets - 10 reps - Gaze Stability (VOR) x 2 Feet Together With Horizontal Head Turns  - 1 x daily - 7 x weekly - 3 sets - 30 sec hold   VESTIBULAR ASSESSMENT   GENERAL OBSERVATION: pt wears bifocals   OCULOMOTOR EXAM: Ocular Alignment: Cover/Uncover Test: WNL Cover/Cross Cover Test: WNL   Ocular ROM: No  Limitations   Spontaneous Nystagmus: absent   Gaze-Induced Nystagmus: absent   Smooth Pursuits: intact   Saccades: intact   Convergence/Divergence: 16 cm d/t L eye     VESTIBULAR - OCULAR REFLEX:    Slow VOR: Normal and Comment: c/o blurriness    VOR Cancellation: Comment: segmental head movement; difficult to discern if d/t neck or visual    Head-Impulse Test: HIT Right: covert HIT Left: negative    AUDITORY SCREEN:  Rinne Test WNL B    POSITIONAL TESTING:  Sit>supine: no dizziness  Left Roll Test: negative, pt c/o nausea  C/o nausea and pt requested to sit up after positional test  155/90 mmHg 97% spO2, 65bpm    Sitting head turns to targets 20: no dizziness Sitting head nods to targets 20: no dizziness Sitting horizontal VOR 30: no dizziness; difficulty maintaining consistent amplitude  Sitting vertical VOR 30: no dizziness; c/o some blurriness in the L eye; difficulty maintaining consistent amplitude   PATIENT EDUCATION: Education details: [facility] as he only have [facility]ST approved  Person educated: Patient Education method: Explanation Education comprehension: verbalized understanding    Note: Objective measures were completed at Evaluation unless otherwise noted.  DIAGNOSTIC FINDINGS: MRI brain showed scattered subcortical infarcts in the left frontal, lateral cortex, left basal ganglia and right cerebellum.     Non-invasive angiography showed vertebral artery stenoses without  concomitant infarct.  Echocardiogram at OSH showed normal EF, no cardiogenic source.  COGNITION: Overall cognitive status: reports short term memory issues   POSTURE: rounded shoulders and forward head  LOWER EXTREMITY ROM:     Active  Right Eval Left Eval  Hip flexion    Hip extension    Hip abduction    Hip adduction    Hip internal rotation    Hip external rotation    Knee flexion 0 -5  Knee extension    Ankle dorsiflexion 12 12   Ankle plantarflexion    Ankle inversion    Ankle eversion     (Blank rows = not tested)  LOWER EXTREMITY MMT:   BLE 4/5 gross strength to resisted tests w/ discomfort to left knee resistance  BED MOBILITY:  Not tested--reports independence w/ grab bar  TRANSFERS: Sit to stand: Complete Independence  Assistive device utilized: None     Stand to sit: Modified independence  Assistive device utilized: None     Chair to chair: Modified independence  Assistive device utilized: None       RAMP:  Not tested  CURB:  Findings: SBA-CGA  STAIRS: Findings: Comments: step-to w/ HR and supervision GAIT: Findings: Distance walked: clinic and Comments: LLE antalgic, decreased stride length/speed -Supervision level  FUNCTIONAL TESTS:  5 times sit to stand: 59 sec Timed up and go (TUG): 38 sec w/ cane 10 meter walk test: 29.78 sec = 0.75 ft/sec Berg Balance: 40/56      GOALS: Goals reviewed with patient? Yes  SHORT TERM GOALS: Target date: 09/26/2023    Patient will be independent in HEP to improve functional outcomes Baseline: Goal status: IN PROGRESS  2.  Demo improved BLE strength and reduced risk for falls per time 30 sec 5xSTS test Baseline: 59 sec Goal status: IN PROGRESS  3.  Demo improved mobility and reduced risk for falls by improved time to 25 sec TUG test Baseline: 38 sec w/ cane Goal status: IN PROGRESS  4.  Report/demo improved transfers by ability to transfer from his recliner at home w/out LOB  Baseline: reports difficulty w/ instances of falling backwards  Goal status: IN PROGRESS  LONG TERM GOALS: Target date: 10/24/2023    Demo low risk for falls per score 49/56 Berg Balance Test to improve safety with ADL Baseline: 40/56 Goal status: IN PROGRESS  2.  Demo improved mobility and reduced risk for falls by improved time to 13.5 sec TUG test Baseline: 38 sec w/ cane Goal status: IN PROGRESS  3.  Demo improved BLE strength and reduced risk for falls  per time 20 sec 5xSTS test Baseline: 59 sec Goal status: IN PROGRESS  4.  Ambulation over various surfaces w/ curbs w/ least restrictive AD w/ modified independence to improve safety in community Baseline: CGA Goal status: IN PROGRESS  5.  Increase gait speed to 2.0 ft/sec to improve efficiency of community mobility Baseline: 0.75 ft/sec w/ cane Goal status: IN PROGRESS   ASSESSMENT:  CLINICAL IMPRESSION: Review of VOR x 1 and 2 with good tracking and no corrective saccades noted and no provocation to symptoms. Multisensory balance with good performance demo normal-mild sway for more challenging conditions. Provided copy of HEP update for VOR x 2 to address motion sensitivity if still present. Continued sessions to advance POC details   OBJECTIVE IMPAIRMENTS: Abnormal gait, decreased activity tolerance, decreased balance, decreased endurance, decreased knowledge of use of DME, difficulty walking, decreased strength, impaired flexibility, and pain.   ACTIVITY  LIMITATIONS: carrying, lifting, bending, standing, stairs, transfers, reach over head, and locomotion level  PARTICIPATION LIMITATIONS: meal prep, cleaning, laundry, interpersonal relationship, driving, shopping, community activity, and yard work  PERSONAL FACTORS: Age, Time since onset of injury/illness/exacerbation, and 3+ comorbidities: PMH are also affecting patient's functional outcome.   REHAB POTENTIAL: Good  CLINICAL DECISION MAKING: Evolving/moderate complexity  EVALUATION COMPLEXITY: Moderate  PLAN:  PT FREQUENCY: 2x/week  PT DURATION: other: 7 weeks  PLANNED INTERVENTIONS: 97750- Physical Performance Testing, 97110-Therapeutic exercises, 97530- Therapeutic activity, 97112- Neuromuscular re-education, 97535- Self Care, [id number]- Manual therapy, [phone]- Gait training, [phone]- Canalith repositioning, [id number]- Aquatic Therapy, [phone]- Electrical stimulation (unattended), and 20560 (1-2 muscles), 20561 (3+ muscles)- Dry  Needling  PLAN FOR NEXT SESSION: check STG    Jonette MARLA Sandifer, PT, DPT 09/27/23 2:03 PM  [facility] at [facility] [address] [place], [place] [address] Phone # [phone] Fax # [phone]

## 2023-10-01 ENCOUNTER — Ambulatory Visit

## 2023-10-01 DIAGNOSIS — R262 Difficulty in walking, not elsewhere classified: Secondary | ICD-10-CM

## 2023-10-01 DIAGNOSIS — R2681 Unsteadiness on feet: Secondary | ICD-10-CM

## 2023-10-01 DIAGNOSIS — M6281 Muscle weakness (generalized): Secondary | ICD-10-CM

## 2023-10-01 DIAGNOSIS — R29898 Other symptoms and signs involving the musculoskeletal system: Secondary | ICD-10-CM

## 2023-10-01 NOTE — Therapy (Signed)
 OUTPATIENT PHYSICAL THERAPY NEURO TREATMENT   Patient Name: Terry Lyons MRN: [id number] DOB:September 02, 1945, 78 y.o., male Today's Date: 10/01/2023   PCP: [name], MD REFERRING PROVIDER: [name], MD/// [name], MD  END OF SESSION:  PT End of Session - 10/01/23 1019     Visit Number 7    Number of Visits 14    Date for PT Re-Evaluation 10/24/23    [facility]ization Type [facility]    PT Start Time 1017    PT Stop Time 1100    PT Time Calculation (min) 43 min    Activity Tolerance Patient tolerated treatment well   nausea   Behavior During Therapy WFL for tasks assessed/performed            Past Medical History:  Diagnosis Date   Allergy    Arthritis    CHF (congestive heart failure) (HCC)    hospitalized in 2017; was diagnosed with an acute excacerbation.    CKD stage 3a, GFR 45-59 ml/min (HCC)    Colon polyps    adenomatous   Diabetes (HCC)    Diabetes mellitus    GERD (gastroesophageal reflux disease)    Gout    History of basal cell carcinoma    W-S Derm   History of squamous cell carcinoma    Hyperlipidemia    Hypertension    Obesity    Rosacea    Tachycardia    Past Surgical History:  Procedure Laterality Date   colonoscopy with polypectomy     X3   ESOPHAGOGASTRODUODENOSCOPY N/A 03/10/2015   Procedure: ESOPHAGOGASTRODUODENOSCOPY (EGD);  Surgeon: Terry Lyons Buddy, MD;  Location: [facility];  Service: Endoscopy;  Laterality: N/A;   esophagus dilated     X3   EUS N/A 05/20/2015   Procedure: UPPER ENDOSCOPIC ULTRASOUND (EUS) RADIAL;  Surgeon: Terry SQUIBB Cedar, MD;  Location: [facility];  Service: Endoscopy;  Laterality: N/A;   KNEE ARTHROSCOPY     bilaterally   LOOP RECORDER INSERTION N/A 08/13/2023   Procedure: LOOP RECORDER INSERTION;  Surgeon: Terry Chew, MD;  Location: [facility];  Service: Cardiovascular;  Laterality: N/A;   PILONIDAL CYST EXCISION     REPLACEMENT TOTAL KNEE Right    R   SHOULDER SURGERY     right shoulder    TONSILLECTOMY     total ethmoidectomy & sphenoidectomiy Bilateral 05/27/13   also reduction of turbinates   TOTAL KNEE ARTHROPLASTY     RIGHT   TOTAL SHOULDER ARTHROPLASTY Left 10/04/2017   Procedure: LEFT TOTAL SHOULDER ARTHROPLASTY;  Surgeon: Terry Drivers, MD;  Location: [facility];  Service: Orthopedics;  Laterality: Left;   Patient Active Problem List   Diagnosis Date Noted   Class 3 obesity 08/12/2023   Acute CVA (cerebrovascular accident) (HCC) 08/11/2023   CKD stage 3a, GFR 45-59 ml/min (HCC) 08/11/2023   Insomnia 08/11/2023   Acute metabolic encephalopathy 01/17/2021   Encephalopathy 01/16/2021   History of squamous cell carcinoma    Status post total shoulder arthroplasty 10/04/2017   Pain in left knee 06/08/2017   Osteoarthritis of glenohumeral joint 05/21/2017   Esophageal candidiasis (HCC) 03/13/2015   Acute diastolic CHF (congestive heart failure) (HCC) 03/13/2015   Sinus tachycardia 03/10/2015   Hypothyroidism 03/10/2015   Hypoxemia    Uncontrolled diabetes mellitus type 2 without complications 03/07/2015   Chest pain on breathing    Intractable nausea and vomiting 03/06/2015   Abdominal pain 03/06/2015   Severe obesity (BMI >= 40) (HCC) 05/21/2013  Chronic sphenoidal sinusitis 05/21/2013   Hypokalemia 05/21/2013   Sinusitis, chronic 12/19/2012   History of anesthesia reaction 03/06/2012   Testosterone  deficiency 03/06/2012   Type II diabetes mellitus (HCC) 12/26/2010   FATIGUE 04/26/2010   EDEMA 04/26/2010   PAROXYSMAL NOCTURNAL DYSPNEA 04/26/2010   ESOPHAGEAL STRICTURE 07/17/2008   FATTY LIVER DISEASE 07/17/2008   Esophageal dysphagia 07/17/2008   Essential hypertension, benign 07/02/2008   Angioedema 06/08/2008   UNSPECIFIED ANEMIA 05/25/2008   URTICARIA 05/25/2008   HYPONATREMIA 05/03/2008   Disorder resulting from impaired renal function 05/03/2008   Hyperlipidemia 11/05/2007   NONSPEC ELEVATION OF LEVELS OF TRANSAMINASE/LDH 11/05/2007   SKIN CANCER, HX  OF 11/05/2007   GOUT, UNSPECIFIED 02/05/2007   DYSMETABOLIC SYNDROME X 02/05/2007   GERD 02/05/2007    ONSET DATE: June 2025  REFERRING DIAG:  I63.9 (ICD-10-CM) - Acute CVA (cerebrovascular accident) (HCC)    THERAPY DIAG:  Difficulty in walking, not elsewhere classified  Muscle weakness (generalized)  Unsteadiness on feet  Other symptoms and signs involving the musculoskeletal system  Rationale for Evaluation and Treatment: Rehabilitation  SUBJECTIVE:                                                                                                                                                                                             SUBJECTIVE STATEMENT: Left knee is a bit more aggravated today. No dizziness or nausea lately, even with the head shaking activities  Pt accompanied by: self   PERTINENT HISTORY: 78 y.o. M with MO, CKD, hypothyroidism, HTN and DM presented with acute vision changes to OSH. MRI brain there showed scattered subcortical (embolic) infarcts in left frontal and lateral cortex, left BG and right cerebellum. Transferred to [facility] for Neurology consultation. R TKA 15 years ago  PAIN:  Are you having pain? Yes: NPRS scale: 4/10 Pain location: L knee Pain description: pt did not report Aggravating factors: pt did not report Relieving factors: sleeve, tylenol    PRECAUTIONS: Fall  RED FLAGS: None   WEIGHT BEARING RESTRICTIONS: No  FALLS: Has patient fallen in last 6 months? No  LIVING ENVIRONMENT: Lives with: lives with their family Lives in: House/apartment-one-level house Stairs: 4 steps to enter Has following equipment at home: Single point cane, Environmental consultant - 4 wheeled, shower chair, and Grab bars  PLOF: Independent  PATIENT GOALS: improve balance, improve transfers  OBJECTIVE:   TODAY'S TREATMENT: 10/01/23 Activity Comments  5xSTS   TUG test   LE PRE 2x10, 5# -LAQ -seated march -standing hip abd -standing alt toe taps at sink   Static multisensory balance Difficulty with single limb support  Gastroc stretch 2x60  sec slantboard  NU-step LE only level 5 x 5 min        PATIENT EDUCATION: Education details: assessment details, PT POC details, recommend use of RW to support left knee Person educated: Patient and Child(ren) Education method: Explanation Education comprehension: verbalized understanding  HOME EXERCISE PROGRAM: Access Code: [email or web address] URL: [email or web address] Date: 09/12/2023 Prepared by: Gellen April Earnie Starring  Exercises - Corner Balance Feet Together With Eyes Open  - 1 x daily - 7 x weekly - 2 sets - 30 sec hold - Corner Balance Feet Together: Eyes Open With Head Turns  - 1 x daily - 7 x weekly - 2 sets - 10 reps - Seated Active Straight-Leg Raise  - 1 x daily - 7 x weekly - 2 sets - 10 reps - Seated Hip Abduction with Resistance  - 1 x daily - 7 x weekly - 2 sets - 10 reps - Standing Hip Abduction with Counter Support  - 1 x daily - 7 x weekly - 3 sets - 10 reps - Gaze Stability (VOR) x 2 Feet Together With Horizontal Head Turns  - 1 x daily - 7 x weekly - 3 sets - 30 sec hold - Seated Long Arc Quad with Ankle Weight  - 2-3 x weekly - 2-3 sets - 10 reps - Seated Hip Flexion March with Ankle Weights  - 2-3 x weekly - 2-3 sets - 10 reps - Standing Hip Abduction with Ankle Weight  - 2-3 x weekly - 2-3 sets - 10 reps - Standing Toe Taps  - 2-3 x weekly - 2-3 sets - 10 reps   VESTIBULAR ASSESSMENT   GENERAL OBSERVATION: pt wears bifocals   OCULOMOTOR EXAM: Ocular Alignment: Cover/Uncover Test: WNL Cover/Cross Cover Test: WNL   Ocular ROM: No Limitations   Spontaneous Nystagmus: absent   Gaze-Induced Nystagmus: absent   Smooth Pursuits: intact   Saccades: intact   Convergence/Divergence: 16 cm d/t L eye     VESTIBULAR - OCULAR REFLEX:    Slow VOR: Normal and Comment: c/o blurriness    VOR Cancellation: Comment: segmental head movement; difficult to discern if d/t neck  or visual    Head-Impulse Test: HIT Right: covert HIT Left: negative    AUDITORY SCREEN:  Rinne Test WNL B    POSITIONAL TESTING:  Sit>supine: no dizziness  Left Roll Test: negative, pt c/o nausea  C/o nausea and pt requested to sit up after positional test  155/90 mmHg 97% spO2, 65bpm    Sitting head turns to targets 20: no dizziness Sitting head nods to targets 20: no dizziness Sitting horizontal VOR 30: no dizziness; difficulty maintaining consistent amplitude  Sitting vertical VOR 30: no dizziness; c/o some blurriness in the L eye; difficulty maintaining consistent amplitude   PATIENT EDUCATION: Education details: [facility] as he only have [facility]ST approved  Person educated: Patient Education method: Explanation Education comprehension: verbalized understanding    Note: Objective measures were completed at Evaluation unless otherwise noted.  DIAGNOSTIC FINDINGS: MRI brain showed scattered subcortical infarcts in the left frontal, lateral cortex, left basal ganglia and right cerebellum.     Non-invasive angiography showed vertebral artery stenoses without concomitant infarct.  Echocardiogram at OSH showed normal EF, no cardiogenic source.  COGNITION: Overall cognitive status: reports short term memory issues   POSTURE: rounded shoulders and forward head  LOWER EXTREMITY ROM:     Active  Right Eval Left Eval  Hip flexion  Hip extension    Hip abduction    Hip adduction    Hip internal rotation    Hip external rotation    Knee flexion 0 -5  Knee extension    Ankle dorsiflexion 12 12  Ankle plantarflexion    Ankle inversion    Ankle eversion     (Blank rows = not tested)  LOWER EXTREMITY MMT:   BLE 4/5 gross strength to resisted tests w/ discomfort to left knee resistance  BED MOBILITY:  Not tested--reports independence w/ grab bar  TRANSFERS: Sit to stand: Complete Independence  Assistive device utilized:  None     Stand to sit: Modified independence  Assistive device utilized: None     Chair to chair: Modified independence  Assistive device utilized: None       RAMP:  Not tested  CURB:  Findings: SBA-CGA  STAIRS: Findings: Comments: step-to w/ HR and supervision GAIT: Findings: Distance walked: clinic and Comments: LLE antalgic, decreased stride length/speed -Supervision level  FUNCTIONAL TESTS:  5 times sit to stand: 59 sec Timed up and go (TUG): 38 sec w/ cane 10 meter walk test: 29.78 sec = 0.75 ft/sec Berg Balance: 40/56      GOALS: Goals reviewed with patient? Yes  SHORT TERM GOALS: Target date: 09/26/2023    Patient will be independent in HEP to improve functional outcomes Baseline: Goal status: MET  2.  Demo improved BLE strength and reduced risk for falls per time 30 sec 5xSTS test Baseline: 59 sec; 16 sec Goal status: MET  3.  Demo improved mobility and reduced risk for falls by improved time to 25 sec TUG test Baseline: 38 sec w/ cane; 16.87 sec w/ NBQC Goal status: MET  4.  Report/demo improved transfers by ability to transfer from his recliner at home w/out LOB  Baseline: reports difficulty w/ instances of falling backwards  Goal status: NOT MET  LONG TERM GOALS: Target date: 10/24/2023    Demo low risk for falls per score 49/56 Berg Balance Test to improve safety with ADL Baseline: 40/56 Goal status: IN PROGRESS  2.  Demo improved mobility and reduced risk for falls by improved time to 13.5 sec TUG test Baseline: 38 sec w/ cane Goal status: IN PROGRESS  3.  Demo improved BLE strength and reduced risk for falls per time 20 sec 5xSTS test Baseline: 59 sec Goal status: IN PROGRESS  4.  Ambulation over various surfaces w/ curbs w/ least restrictive AD w/ modified independence to improve safety in community Baseline: CGA Goal status: IN PROGRESS  5.  Increase gait speed to 2.0 ft/sec to improve efficiency of community mobility Baseline: 0.75  ft/sec w/ cane Goal status: IN PROGRESS   ASSESSMENT:  CLINICAL IMPRESSION: Improved performance 5xSTS and TUG test from baseline times, but still indicating high risk for falls but significantly improved from initial outset.  Developed LE PRE to perform at home for HEP 2-3 x/wk emphasis on strength development and provided updated HEP materials. Difficulty with static balance with bias to single limb support and unable to tolerate LLE bias at all due to comorbid knee pain. NU-step to end session for improved endurance and tolerance to left knee. Continued sessions to progess POC details to improve mobility and reduce fall risk  OBJECTIVE IMPAIRMENTS: Abnormal gait, decreased activity tolerance, decreased balance, decreased endurance, decreased knowledge of use of DME, difficulty walking, decreased strength, impaired flexibility, and pain.   ACTIVITY LIMITATIONS: carrying, lifting, bending, standing, stairs, transfers, reach over head, and  locomotion level  PARTICIPATION LIMITATIONS: meal prep, cleaning, laundry, interpersonal relationship, driving, shopping, community activity, and yard work  PERSONAL FACTORS: Age, Time since onset of injury/illness/exacerbation, and 3+ comorbidities: PMH are also affecting patient's functional outcome.   REHAB POTENTIAL: Good  CLINICAL DECISION MAKING: Evolving/moderate complexity  EVALUATION COMPLEXITY: Moderate  PLAN:  PT FREQUENCY: 2x/week  PT DURATION: other: 7 weeks  PLANNED INTERVENTIONS: 97750- Physical Performance Testing, 97110-Therapeutic exercises, 97530- Therapeutic activity, 97112- Neuromuscular re-education, 97535- Self Care, [id number]- Manual therapy, [phone]- Gait training, [phone]- Canalith repositioning, [id number]- Aquatic Therapy, [phone]- Electrical stimulation (unattended), and 20560 (1-2 muscles), 20561 (3+ muscles)- Dry Needling  PLAN FOR NEXT SESSION: review HEP   Jonette MARLA Sandifer, PT, DPT 10/01/23 10:20 AM  [facility]  at [facility] [address], [address] [place], [place] [address] Phone # [phone] Fax # [phone]

## 2023-10-04 ENCOUNTER — Ambulatory Visit

## 2023-10-04 DIAGNOSIS — R29898 Other symptoms and signs involving the musculoskeletal system: Secondary | ICD-10-CM

## 2023-10-04 DIAGNOSIS — R262 Difficulty in walking, not elsewhere classified: Secondary | ICD-10-CM | POA: Diagnosis not present

## 2023-10-04 DIAGNOSIS — M6281 Muscle weakness (generalized): Secondary | ICD-10-CM

## 2023-10-04 DIAGNOSIS — R2681 Unsteadiness on feet: Secondary | ICD-10-CM

## 2023-10-04 NOTE — Therapy (Signed)
 OUTPATIENT PHYSICAL THERAPY NEURO TREATMENT   Patient Name: Terry Lyons MRN: [id number] DOB:02-25-1945, 78 y.o., male Today's Date: 10/04/2023   PCP: [name], MD REFERRING PROVIDER: [name], MD/// [name], MD  END OF SESSION:  PT End of Session - 10/04/23 1317     Visit Number 8    Number of Visits 14    Date for PT Re-Evaluation 10/24/23    [facility]ization Type [facility]    PT Start Time 1317    PT Stop Time 1400    PT Time Calculation (min) 43 min    Activity Tolerance Patient tolerated treatment well   nausea   Behavior During Therapy WFL for tasks assessed/performed            Past Medical History:  Diagnosis Date   Allergy    Arthritis    CHF (congestive heart failure) (HCC)    hospitalized in 2017; was diagnosed with an acute excacerbation.    CKD stage 3a, GFR 45-59 ml/min (HCC)    Colon polyps    adenomatous   Diabetes (HCC)    Diabetes mellitus    GERD (gastroesophageal reflux disease)    Gout    History of basal cell carcinoma    W-S Derm   History of squamous cell carcinoma    Hyperlipidemia    Hypertension    Obesity    Rosacea    Tachycardia    Past Surgical History:  Procedure Laterality Date   colonoscopy with polypectomy     X3   ESOPHAGOGASTRODUODENOSCOPY N/A 03/10/2015   Procedure: ESOPHAGOGASTRODUODENOSCOPY (EGD);  Surgeon: [name], MD;  Location: [facility];  Service: Endoscopy;  Laterality: N/A;   esophagus dilated     X3   EUS N/A 05/20/2015   Procedure: UPPER ENDOSCOPIC ULTRASOUND (EUS) RADIAL;  Surgeon: [name], MD;  Location: [facility];  Service: Endoscopy;  Laterality: N/A;   KNEE ARTHROSCOPY     bilaterally   LOOP RECORDER INSERTION N/A 08/13/2023   Procedure: LOOP RECORDER INSERTION;  Surgeon: [name], MD;  Location: [facility] INVASIVE CV LAB;  Service: Cardiovascular;  Laterality: N/A;   PILONIDAL CYST EXCISION     REPLACEMENT TOTAL KNEE Right    R   SHOULDER SURGERY     right shoulder    TONSILLECTOMY     total ethmoidectomy & sphenoidectomiy Bilateral 05/27/13   also reduction of turbinates   TOTAL KNEE ARTHROPLASTY     RIGHT   TOTAL SHOULDER ARTHROPLASTY Left 10/04/2017   Procedure: LEFT TOTAL SHOULDER ARTHROPLASTY;  Surgeon: [name], MD;  Location: [facility];  Service: Orthopedics;  Laterality: Left;   Patient Active Problem List   Diagnosis Date Noted   Class 3 obesity 08/12/2023   Acute CVA (cerebrovascular accident) (HCC) 08/11/2023   CKD stage 3a, GFR 45-59 ml/min (HCC) 08/11/2023   Insomnia 08/11/2023   Acute metabolic encephalopathy 01/17/2021   Encephalopathy 01/16/2021   History of squamous cell carcinoma    Status post total shoulder arthroplasty 10/04/2017   Pain in left knee 06/08/2017   Osteoarthritis of glenohumeral joint 05/21/2017   Esophageal candidiasis (HCC) 03/13/2015   Acute diastolic CHF (congestive heart failure) (HCC) 03/13/2015   Sinus tachycardia 03/10/2015   Hypothyroidism 03/10/2015   Hypoxemia    Uncontrolled diabetes mellitus type 2 without complications 03/07/2015   Chest pain on breathing    Intractable nausea and vomiting 03/06/2015   Abdominal pain 03/06/2015   Severe obesity (BMI >= 40) (HCC) 05/21/2013  Chronic sphenoidal sinusitis 05/21/2013   Hypokalemia 05/21/2013   Sinusitis, chronic 12/19/2012   History of anesthesia reaction 03/06/2012   Testosterone  deficiency 03/06/2012   Type II diabetes mellitus (HCC) 12/26/2010   FATIGUE 04/26/2010   EDEMA 04/26/2010   PAROXYSMAL NOCTURNAL DYSPNEA 04/26/2010   ESOPHAGEAL STRICTURE 07/17/2008   FATTY LIVER DISEASE 07/17/2008   Esophageal dysphagia 07/17/2008   Essential hypertension, benign 07/02/2008   Angioedema 06/08/2008   UNSPECIFIED ANEMIA 05/25/2008   URTICARIA 05/25/2008   HYPONATREMIA 05/03/2008   Disorder resulting from impaired renal function 05/03/2008   Hyperlipidemia 11/05/2007   NONSPEC ELEVATION OF LEVELS OF TRANSAMINASE/LDH 11/05/2007   SKIN CANCER, HX  OF 11/05/2007   GOUT, UNSPECIFIED 02/05/2007   DYSMETABOLIC SYNDROME X 02/05/2007   GERD 02/05/2007    ONSET DATE: June 2025  REFERRING DIAG:  I63.9 (ICD-10-CM) - Acute CVA (cerebrovascular accident) (HCC)    THERAPY DIAG:  Difficulty in walking, not elsewhere classified  Muscle weakness (generalized)  Unsteadiness on feet  Other symptoms and signs involving the musculoskeletal system  Rationale for Evaluation and Treatment: Rehabilitation  SUBJECTIVE:                                                                                                                                                                                             SUBJECTIVE STATEMENT: Doing ok, no new issues  Pt accompanied by: self   PERTINENT HISTORY: 78 y.o. M with MO, CKD, hypothyroidism, HTN and DM presented with acute vision changes to OSH. MRI brain there showed scattered subcortical (embolic) infarcts in left frontal and lateral cortex, left BG and right cerebellum. Transferred to [facility] for Neurology consultation. R TKA 15 years ago  PAIN:  Are you having pain? Yes: NPRS scale: 4/10 Pain location: L knee Pain description: pt did not report Aggravating factors: pt did not report Relieving factors: sleeve, tylenol    PRECAUTIONS: Fall  RED FLAGS: None   WEIGHT BEARING RESTRICTIONS: No  FALLS: Has patient fallen in last 6 months? No  LIVING ENVIRONMENT: Lives with: lives with their family Lives in: House/apartment-one-level house Stairs: 4 steps to enter Has following equipment at home: Single point cane, Environmental consultant - 4 wheeled, shower chair, and Grab bars  PLOF: Independent  PATIENT GOALS: improve balance, improve transfers  OBJECTIVE:   TODAY'S TREATMENT: 10/04/23 Activity Comments  STS on airex 3x5 Elevated seat height  TKE attempted but too painful left knee   LAQ 3x12 5#  Hip add iso 3x12   Seated clamshells 3x12 green  Seated hamstring curls 3x12 green  Standing alt toe  taps x 2 min 5#, 6 box  Sidestepping  x 2 min 5#    TODAY'S TREATMENT: 10/01/23 Activity Comments  5xSTS   TUG test   LE PRE 2x10, 5# -LAQ -seated march -standing hip abd -standing alt toe taps at sink  Static multisensory balance Difficulty with single limb support  Gastroc stretch 2x60 sec slantboard  NU-step LE only level 5 x 5 min        PATIENT EDUCATION: Education details: assessment details, PT POC details, recommend use of RW to support left knee Person educated: Patient and Child(ren) Education method: Explanation Education comprehension: verbalized understanding  HOME EXERCISE PROGRAM: Access Code: [email or web address] URL: [email or web address] Date: 09/12/2023 Prepared by: Gellen April Earnie Starring  Exercises - Corner Balance Feet Together With Eyes Open  - 1 x daily - 7 x weekly - 2 sets - 30 sec hold - Corner Balance Feet Together: Eyes Open With Head Turns  - 1 x daily - 7 x weekly - 2 sets - 10 reps - Seated Active Straight-Leg Raise  - 1 x daily - 7 x weekly - 2 sets - 10 reps - Seated Hip Abduction with Resistance  - 1 x daily - 7 x weekly - 2 sets - 10 reps - Standing Hip Abduction with Counter Support  - 1 x daily - 7 x weekly - 3 sets - 10 reps - Gaze Stability (VOR) x 2 Feet Together With Horizontal Head Turns  - 1 x daily - 7 x weekly - 3 sets - 30 sec hold - Seated Long Arc Quad with Ankle Weight  - 2-3 x weekly - 2-3 sets - 10 reps - Seated Hip Flexion March with Ankle Weights  - 2-3 x weekly - 2-3 sets - 10 reps - Standing Hip Abduction with Ankle Weight  - 2-3 x weekly - 2-3 sets - 10 reps - Standing Toe Taps  - 2-3 x weekly - 2-3 sets - 10 reps   VESTIBULAR ASSESSMENT   GENERAL OBSERVATION: pt wears bifocals   OCULOMOTOR EXAM: Ocular Alignment: Cover/Uncover Test: WNL Cover/Cross Cover Test: WNL   Ocular ROM: No Limitations   Spontaneous Nystagmus: absent   Gaze-Induced Nystagmus: absent   Smooth Pursuits: intact   Saccades:  intact   Convergence/Divergence: 16 cm d/t L eye     VESTIBULAR - OCULAR REFLEX:    Slow VOR: Normal and Comment: c/o blurriness    VOR Cancellation: Comment: segmental head movement; difficult to discern if d/t neck or visual    Head-Impulse Test: HIT Right: covert HIT Left: negative    AUDITORY SCREEN:  Rinne Test WNL B    POSITIONAL TESTING:  Sit>supine: no dizziness  Left Roll Test: negative, pt c/o nausea  C/o nausea and pt requested to sit up after positional test  155/90 mmHg 97% spO2, 65bpm    Sitting head turns to targets 20: no dizziness Sitting head nods to targets 20: no dizziness Sitting horizontal VOR 30: no dizziness; difficulty maintaining consistent amplitude  Sitting vertical VOR 30: no dizziness; c/o some blurriness in the L eye; difficulty maintaining consistent amplitude   PATIENT EDUCATION: Education details: [facility] as he only have [facility]ST approved  Person educated: Patient Education method: Explanation Education comprehension: verbalized understanding    Note: Objective measures were completed at Evaluation unless otherwise noted.  DIAGNOSTIC FINDINGS: MRI brain showed scattered subcortical infarcts in the left frontal, lateral cortex, left basal ganglia and right cerebellum.     Non-invasive angiography showed vertebral artery stenoses without concomitant  infarct.  Echocardiogram at OSH showed normal EF, no cardiogenic source.  COGNITION: Overall cognitive status: reports short term memory issues   POSTURE: rounded shoulders and forward head  LOWER EXTREMITY ROM:     Active  Right Eval Left Eval  Hip flexion    Hip extension    Hip abduction    Hip adduction    Hip internal rotation    Hip external rotation    Knee flexion 0 -5  Knee extension    Ankle dorsiflexion 12 12  Ankle plantarflexion    Ankle inversion    Ankle eversion     (Blank rows = not tested)  LOWER EXTREMITY MMT:   BLE  4/5 gross strength to resisted tests w/ discomfort to left knee resistance  BED MOBILITY:  Not tested--reports independence w/ grab bar  TRANSFERS: Sit to stand: Complete Independence  Assistive device utilized: None     Stand to sit: Modified independence  Assistive device utilized: None     Chair to chair: Modified independence  Assistive device utilized: None       RAMP:  Not tested  CURB:  Findings: SBA-CGA  STAIRS: Findings: Comments: step-to w/ HR and supervision GAIT: Findings: Distance walked: clinic and Comments: LLE antalgic, decreased stride length/speed -Supervision level  FUNCTIONAL TESTS:  5 times sit to stand: 59 sec Timed up and go (TUG): 38 sec w/ cane 10 meter walk test: 29.78 sec = 0.75 ft/sec Berg Balance: 40/56      GOALS: Goals reviewed with patient? Yes  SHORT TERM GOALS: Target date: 09/26/2023    Patient will be independent in HEP to improve functional outcomes Baseline: Goal status: MET  2.  Demo improved BLE strength and reduced risk for falls per time 30 sec 5xSTS test Baseline: 59 sec; 16 sec Goal status: MET  3.  Demo improved mobility and reduced risk for falls by improved time to 25 sec TUG test Baseline: 38 sec w/ cane; 16.87 sec w/ NBQC Goal status: MET  4.  Report/demo improved transfers by ability to transfer from his recliner at home w/out LOB  Baseline: reports difficulty w/ instances of falling backwards  Goal status: NOT MET  LONG TERM GOALS: Target date: 10/24/2023    Demo low risk for falls per score 49/56 Berg Balance Test to improve safety with ADL Baseline: 40/56 Goal status: IN PROGRESS  2.  Demo improved mobility and reduced risk for falls by improved time to 13.5 sec TUG test Baseline: 38 sec w/ cane Goal status: IN PROGRESS  3.  Demo improved BLE strength and reduced risk for falls per time 20 sec 5xSTS test Baseline: 59 sec Goal status: IN PROGRESS  4.  Ambulation over various surfaces w/ curbs w/  least restrictive AD w/ modified independence to improve safety in community Baseline: CGA Goal status: IN PROGRESS  5.  Increase gait speed to 2.0 ft/sec to improve efficiency of community mobility Baseline: 0.75 ft/sec w/ cane Goal status: IN PROGRESS   ASSESSMENT:  CLINICAL IMPRESSION: Progressed BLE open chain strength activities to 5# to match what he recently purchased and seems to be an appropriate challenge and tolerating open chain movements well with some limit to left knee ROM due to ongoing OA pain.  Attempted several standing based movements but left knee pain limiting. His left knee pain/instability remains a limiting factor for more dynamic standing activities  OBJECTIVE IMPAIRMENTS: Abnormal gait, decreased activity tolerance, decreased balance, decreased endurance, decreased knowledge of use of DME, difficulty  walking, decreased strength, impaired flexibility, and pain.   ACTIVITY LIMITATIONS: carrying, lifting, bending, standing, stairs, transfers, reach over head, and locomotion level  PARTICIPATION LIMITATIONS: meal prep, cleaning, laundry, interpersonal relationship, driving, shopping, community activity, and yard work  PERSONAL FACTORS: Age, Time since onset of injury/illness/exacerbation, and 3+ comorbidities: PMH are also affecting patient's functional outcome.   REHAB POTENTIAL: Good  CLINICAL DECISION MAKING: Evolving/moderate complexity  EVALUATION COMPLEXITY: Moderate  PLAN:  PT FREQUENCY: 2x/week  PT DURATION: other: 7 weeks  PLANNED INTERVENTIONS: 97750- Physical Performance Testing, 97110-Therapeutic exercises, 97530- Therapeutic activity, 97112- Neuromuscular re-education, 97535- Self Care, [id number]- Manual therapy, [phone]- Gait training, [phone]- Canalith repositioning, [id number]- Aquatic Therapy, [phone]- Electrical stimulation (unattended), and 20560 (1-2 muscles), 20561 (3+ muscles)- Dry Needling  PLAN FOR NEXT SESSION: review HEP w/ 5# ankle  weights   Jonette MARLA Sandifer, PT, DPT 10/04/23 1:17 PM  [facility] [address] [place], [place] [address] Phone # [phone] Fax # [phone]

## 2023-10-08 ENCOUNTER — Ambulatory Visit

## 2023-10-08 DIAGNOSIS — R262 Difficulty in walking, not elsewhere classified: Secondary | ICD-10-CM | POA: Diagnosis not present

## 2023-10-08 DIAGNOSIS — R2681 Unsteadiness on feet: Secondary | ICD-10-CM

## 2023-10-08 DIAGNOSIS — R29898 Other symptoms and signs involving the musculoskeletal system: Secondary | ICD-10-CM

## 2023-10-08 DIAGNOSIS — M6281 Muscle weakness (generalized): Secondary | ICD-10-CM

## 2023-10-08 NOTE — Therapy (Signed)
 OUTPATIENT PHYSICAL THERAPY NEURO TREATMENT   Patient Name: Terry Lyons MRN: [id number] DOB:Oct 26, 1945, 78 y.o., male Today's Date: 10/08/2023   PCP: [name], MD REFERRING PROVIDER: [name], MD/// [name], MD  END OF SESSION:  PT End of Session - 10/08/23 1022     Visit Number 9    Number of Visits 14    Date for PT Re-Evaluation 10/24/23    [facility]ization Type [facility]    PT Start Time 1022    PT Stop Time 1100    PT Time Calculation (min) 38 min    Activity Tolerance Patient tolerated treatment well   nausea   Behavior During Therapy WFL for tasks assessed/performed            Past Medical History:  Diagnosis Date   Allergy    Arthritis    CHF (congestive heart failure) (HCC)    hospitalized in 2017; was diagnosed with an acute excacerbation.    CKD stage 3a, GFR 45-59 ml/min (HCC)    Colon polyps    adenomatous   Diabetes (HCC)    Diabetes mellitus    GERD (gastroesophageal reflux disease)    Gout    History of basal cell carcinoma    W-S Derm   History of squamous cell carcinoma    Hyperlipidemia    Hypertension    Obesity    Rosacea    Tachycardia    Past Surgical History:  Procedure Laterality Date   colonoscopy with polypectomy     X3   ESOPHAGOGASTRODUODENOSCOPY N/A 03/10/2015   Procedure: ESOPHAGOGASTRODUODENOSCOPY (EGD);  Surgeon: [name], MD;  Location: [facility];  Service: Endoscopy;  Laterality: N/A;   esophagus dilated     X3   EUS N/A 05/20/2015   Procedure: UPPER ENDOSCOPIC ULTRASOUND (EUS) RADIAL;  Surgeon: [name], MD;  Location: [facility];  Service: Endoscopy;  Laterality: N/A;   KNEE ARTHROSCOPY     bilaterally   LOOP RECORDER INSERTION N/A 08/13/2023   Procedure: LOOP RECORDER INSERTION;  Surgeon: [name], MD;  Location: [facility] INVASIVE CV LAB;  Service: Cardiovascular;  Laterality: N/A;   PILONIDAL CYST EXCISION     REPLACEMENT TOTAL KNEE Right    R   SHOULDER SURGERY     right shoulder    TONSILLECTOMY     total ethmoidectomy & sphenoidectomiy Bilateral 05/27/13   also reduction of turbinates   TOTAL KNEE ARTHROPLASTY     RIGHT   TOTAL SHOULDER ARTHROPLASTY Left 10/04/2017   Procedure: LEFT TOTAL SHOULDER ARTHROPLASTY;  Surgeon: [name], MD;  Location: [facility];  Service: Orthopedics;  Laterality: Left;   Patient Active Problem List   Diagnosis Date Noted   Class 3 obesity 08/12/2023   Acute CVA (cerebrovascular accident) (HCC) 08/11/2023   CKD stage 3a, GFR 45-59 ml/min (HCC) 08/11/2023   Insomnia 08/11/2023   Acute metabolic encephalopathy 01/17/2021   Encephalopathy 01/16/2021   History of squamous cell carcinoma    Status post total shoulder arthroplasty 10/04/2017   Pain in left knee 06/08/2017   Osteoarthritis of glenohumeral joint 05/21/2017   Esophageal candidiasis (HCC) 03/13/2015   Acute diastolic CHF (congestive heart failure) (HCC) 03/13/2015   Sinus tachycardia 03/10/2015   Hypothyroidism 03/10/2015   Hypoxemia    Uncontrolled diabetes mellitus type 2 without complications 03/07/2015   Chest pain on breathing    Intractable nausea and vomiting 03/06/2015   Abdominal pain 03/06/2015   Severe obesity (BMI >= 40) (HCC) 05/21/2013  Chronic sphenoidal sinusitis 05/21/2013   Hypokalemia 05/21/2013   Sinusitis, chronic 12/19/2012   History of anesthesia reaction 03/06/2012   Testosterone  deficiency 03/06/2012   Type II diabetes mellitus (HCC) 12/26/2010   FATIGUE 04/26/2010   EDEMA 04/26/2010   PAROXYSMAL NOCTURNAL DYSPNEA 04/26/2010   ESOPHAGEAL STRICTURE 07/17/2008   FATTY LIVER DISEASE 07/17/2008   Esophageal dysphagia 07/17/2008   Essential hypertension, benign 07/02/2008   Angioedema 06/08/2008   UNSPECIFIED ANEMIA 05/25/2008   URTICARIA 05/25/2008   HYPONATREMIA 05/03/2008   Disorder resulting from impaired renal function 05/03/2008   Hyperlipidemia 11/05/2007   NONSPEC ELEVATION OF LEVELS OF TRANSAMINASE/LDH 11/05/2007   SKIN CANCER, HX  OF 11/05/2007   GOUT, UNSPECIFIED 02/05/2007   DYSMETABOLIC SYNDROME X 02/05/2007   GERD 02/05/2007    ONSET DATE: June 2025  REFERRING DIAG:  I63.9 (ICD-10-CM) - Acute CVA (cerebrovascular accident) (HCC)    THERAPY DIAG:  Difficulty in walking, not elsewhere classified  Muscle weakness (generalized)  Unsteadiness on feet  Other symptoms and signs involving the musculoskeletal system  Rationale for Evaluation and Treatment: Rehabilitation  SUBJECTIVE:                                                                                                                                                                                             SUBJECTIVE STATEMENT: Doing ok, no new issues. The left knee is the main limiting factor  Pt accompanied by: self   PERTINENT HISTORY: 78 y.o. M with MO, CKD, hypothyroidism, HTN and DM presented with acute vision changes to OSH. MRI brain there showed scattered subcortical (embolic) infarcts in left frontal and lateral cortex, left BG and right cerebellum. Transferred to [facility] for Neurology consultation. R TKA 15 years ago  PAIN:  Are you having pain? Yes: NPRS scale: 4/10 Pain location: L knee Pain description: pt did not report Aggravating factors: pt did not report Relieving factors: sleeve, tylenol    PRECAUTIONS: Fall  RED FLAGS: None   WEIGHT BEARING RESTRICTIONS: No  FALLS: Has patient fallen in last 6 months? No  LIVING ENVIRONMENT: Lives with: lives with their family Lives in: House/apartment-one-level house Stairs: 4 steps to enter Has following equipment at home: Single point cane, Environmental consultant - 4 wheeled, shower chair, and Grab bars  PLOF: Independent  PATIENT GOALS: improve balance, improve transfers  OBJECTIVE:   TODAY'S TREATMENT: 10/08/23 Activity Comments  LAQ 3x10 5#   Sidestep x 60 sec, 5#   Alt stair taps x 60 sec, 5#, 6   Static multisensory balance  Good tolerance w/ normal-mild sway against challenges.  Eyes closed   SLR 3x10 Manual assist  Supine hip abd 3x10 Manual resist  Hooklying hip abd manual resist 3x10      TODAY'S TREATMENT: 10/04/23 Activity Comments  STS on airex 3x5 Elevated seat height  TKE attempted but too painful left knee   LAQ 3x12 5#  Hip add iso 3x12   Seated clamshells 3x12 green  Seated hamstring curls 3x12 green  Standing alt toe taps x 2 min 5#, 6 box  Sidestepping x 2 min 5#        PATIENT EDUCATION: Education details: assessment details, PT POC details, recommend use of RW to support left knee Person educated: Patient and Child(ren) Education method: Explanation Education comprehension: verbalized understanding  HOME EXERCISE PROGRAM: Access Code: [email or web address] URL: [email or web address] Date: 09/12/2023 Prepared by: Gellen April Earnie Starring  Exercises - Corner Balance Feet Together With Eyes Open  - 1 x daily - 7 x weekly - 2 sets - 30 sec hold - Corner Balance Feet Together: Eyes Open With Head Turns  - 1 x daily - 7 x weekly - 2 sets - 10 reps - Seated Active Straight-Leg Raise  - 1 x daily - 7 x weekly - 2 sets - 10 reps - Seated Hip Abduction with Resistance  - 1 x daily - 7 x weekly - 2 sets - 10 reps - Standing Hip Abduction with Counter Support  - 1 x daily - 7 x weekly - 3 sets - 10 reps - Gaze Stability (VOR) x 2 Feet Together With Horizontal Head Turns  - 1 x daily - 7 x weekly - 3 sets - 30 sec hold - Seated Long Arc Quad with Ankle Weight  - 2-3 x weekly - 2-3 sets - 10 reps - Seated Hip Flexion March with Ankle Weights  - 2-3 x weekly - 2-3 sets - 10 reps - Standing Hip Abduction with Ankle Weight  - 2-3 x weekly - 2-3 sets - 10 reps - Standing Toe Taps  - 2-3 x weekly - 2-3 sets - 10 reps   VESTIBULAR ASSESSMENT   GENERAL OBSERVATION: pt wears bifocals   OCULOMOTOR EXAM: Ocular Alignment: Cover/Uncover Test: WNL Cover/Cross Cover Test: WNL   Ocular ROM: No Limitations   Spontaneous Nystagmus:  absent   Gaze-Induced Nystagmus: absent   Smooth Pursuits: intact   Saccades: intact   Convergence/Divergence: 16 cm d/t L eye     VESTIBULAR - OCULAR REFLEX:    Slow VOR: Normal and Comment: c/o blurriness    VOR Cancellation: Comment: segmental head movement; difficult to discern if d/t neck or visual    Head-Impulse Test: HIT Right: covert HIT Left: negative    AUDITORY SCREEN:  Rinne Test WNL B    POSITIONAL TESTING:  Sit>supine: no dizziness  Left Roll Test: negative, pt c/o nausea  C/o nausea and pt requested to sit up after positional test  155/90 mmHg 97% spO2, 65bpm    Sitting head turns to targets 20: no dizziness Sitting head nods to targets 20: no dizziness Sitting horizontal VOR 30: no dizziness; difficulty maintaining consistent amplitude  Sitting vertical VOR 30: no dizziness; c/o some blurriness in the L eye; difficulty maintaining consistent amplitude   PATIENT EDUCATION: Education details: [facility] as he only have [facility] approved  Person educated: Patient Education method: Explanation Education comprehension: verbalized understanding    Note: Objective measures were completed at Evaluation unless otherwise noted.  DIAGNOSTIC FINDINGS: MRI brain showed scattered subcortical infarcts in the left frontal, lateral cortex,  left basal ganglia and right cerebellum.     Non-invasive angiography showed vertebral artery stenoses without concomitant infarct.  Echocardiogram at OSH showed normal EF, no cardiogenic source.  COGNITION: Overall cognitive status: reports short term memory issues   POSTURE: rounded shoulders and forward head  LOWER EXTREMITY ROM:     Active  Right Eval Left Eval  Hip flexion    Hip extension    Hip abduction    Hip adduction    Hip internal rotation    Hip external rotation    Knee flexion 0 -5  Knee extension    Ankle dorsiflexion 12 12  Ankle plantarflexion    Ankle inversion     Ankle eversion     (Blank rows = not tested)  LOWER EXTREMITY MMT:   BLE 4/5 gross strength to resisted tests w/ discomfort to left knee resistance  BED MOBILITY:  Not tested--reports independence w/ grab bar  TRANSFERS: Sit to stand: Complete Independence  Assistive device utilized: None     Stand to sit: Modified independence  Assistive device utilized: None     Chair to chair: Modified independence  Assistive device utilized: None       RAMP:  Not tested  CURB:  Findings: SBA-CGA  STAIRS: Findings: Comments: step-to w/ HR and supervision GAIT: Findings: Distance walked: clinic and Comments: LLE antalgic, decreased stride length/speed -Supervision level  FUNCTIONAL TESTS:  5 times sit to stand: 59 sec Timed up and go (TUG): 38 sec w/ cane 10 meter walk test: 29.78 sec = 0.75 ft/sec Berg Balance: 40/56      GOALS: Goals reviewed with patient? Yes  SHORT TERM GOALS: Target date: 09/26/2023    Patient will be independent in HEP to improve functional outcomes Baseline: Goal status: MET  2.  Demo improved BLE strength and reduced risk for falls per time 30 sec 5xSTS test Baseline: 59 sec; 16 sec Goal status: MET  3.  Demo improved mobility and reduced risk for falls by improved time to 25 sec TUG test Baseline: 38 sec w/ cane; 16.87 sec w/ NBQC Goal status: MET  4.  Report/demo improved transfers by ability to transfer from his recliner at home w/out LOB  Baseline: reports difficulty w/ instances of falling backwards  Goal status: NOT MET  LONG TERM GOALS: Target date: 10/24/2023    Demo low risk for falls per score 49/56 Berg Balance Test to improve safety with ADL Baseline: 40/56 Goal status: IN PROGRESS  2.  Demo improved mobility and reduced risk for falls by improved time to 13.5 sec TUG test Baseline: 38 sec w/ cane Goal status: IN PROGRESS  3.  Demo improved BLE strength and reduced risk for falls per time 20 sec 5xSTS test Baseline: 59  sec Goal status: IN PROGRESS  4.  Ambulation over various surfaces w/ curbs w/ least restrictive AD w/ modified independence to improve safety in community Baseline: CGA Goal status: IN PROGRESS  5.  Increase gait speed to 2.0 ft/sec to improve efficiency of community mobility Baseline: 0.75 ft/sec w/ cane Goal status: IN PROGRESS   ASSESSMENT:  CLINICAL IMPRESSION: Demo great balance/tolerance to static multisensory balance without LOB and minimal sway with increased demands. LE strength to improve activity tolerance and LLE stability.  Left knee pain is main limiting factor for his balance and mobility  OBJECTIVE IMPAIRMENTS: Abnormal gait, decreased activity tolerance, decreased balance, decreased endurance, decreased knowledge of use of DME, difficulty walking, decreased strength, impaired flexibility, and pain.  ACTIVITY LIMITATIONS: carrying, lifting, bending, standing, stairs, transfers, reach over head, and locomotion level  PARTICIPATION LIMITATIONS: meal prep, cleaning, laundry, interpersonal relationship, driving, shopping, community activity, and yard work  PERSONAL FACTORS: Age, Time since onset of injury/illness/exacerbation, and 3+ comorbidities: PMH are also affecting patient's functional outcome.   REHAB POTENTIAL: Good  CLINICAL DECISION MAKING: Evolving/moderate complexity  EVALUATION COMPLEXITY: Moderate  PLAN:  PT FREQUENCY: 2x/week  PT DURATION: other: 7 weeks  PLANNED INTERVENTIONS: 97750- Physical Performance Testing, 97110-Therapeutic exercises, 97530- Therapeutic activity, 97112- Neuromuscular re-education, 97535- Self Care, [id number]- Manual therapy, [phone]- Gait training, [phone]- Canalith repositioning, [id number]- Aquatic Therapy, [phone]- Electrical stimulation (unattended), and 20560 (1-2 muscles), 20561 (3+ muscles)- Dry Needling  PLAN FOR NEXT SESSION: review HEP w/ 5# ankle weights   Jonette MARLA Sandifer, PT, DPT 10/08/23 10:25 AM  [facility] [address] [place], [place] [address] Phone # [phone] Fax # [phone]

## 2023-10-10 ENCOUNTER — Ambulatory Visit

## 2023-10-15 ENCOUNTER — Ambulatory Visit (INDEPENDENT_AMBULATORY_CARE_PROVIDER_SITE_OTHER)

## 2023-10-15 DIAGNOSIS — I639 Cerebral infarction, unspecified: Secondary | ICD-10-CM

## 2023-10-16 ENCOUNTER — Encounter: Payer: Self-pay | Admitting: Physical Therapy

## 2023-10-16 ENCOUNTER — Ambulatory Visit: Attending: Family Medicine | Admitting: Physical Therapy

## 2023-10-16 DIAGNOSIS — M6281 Muscle weakness (generalized): Secondary | ICD-10-CM | POA: Insufficient documentation

## 2023-10-16 DIAGNOSIS — R2681 Unsteadiness on feet: Secondary | ICD-10-CM | POA: Diagnosis present

## 2023-10-16 NOTE — Therapy (Signed)
 OUTPATIENT PHYSICAL THERAPY NEURO TREATMENT/PROGRESS NOTE   Patient Name: Terry Lyons MRN: [id number] DOB:1945/09/05, 78 y.o., male Today's Date: 10/16/2023   PCP: [name], MD REFERRING PROVIDER: [name], MD/// [name], MD  Progress Note Reporting Period 09/05/2023 to 10/16/2023  See note below for Objective Data and Assessment of Progress/Goals.     END OF SESSION:  PT End of Session - 10/16/23 1022     Visit Number 10    Number of Visits 14    Date for PT Re-Evaluation 10/24/23    [facility]ization Type [facility]    Authorization Time Period 08/20/2023-12/18/2023    Authorization - Visit Number 10    Authorization - Number of Visits 15    Progress Note Due on Visit 10    PT Start Time 1023    PT Stop Time 1043    PT Time Calculation (min) 20 min    Equipment Utilized During Treatment Gait belt    Activity Tolerance Patient limited by pain    Behavior During Therapy WFL for tasks assessed/performed             Past Medical History:  Diagnosis Date   Allergy    Arthritis    CHF (congestive heart failure) (HCC)    hospitalized in 2017; was diagnosed with an acute excacerbation.    CKD stage 3a, GFR 45-59 ml/min (HCC)    Colon polyps    adenomatous   Diabetes (HCC)    Diabetes mellitus    GERD (gastroesophageal reflux disease)    Gout    History of basal cell carcinoma    W-S Derm   History of squamous cell carcinoma    Hyperlipidemia    Hypertension    Obesity    Rosacea    Tachycardia    Past Surgical History:  Procedure Laterality Date   colonoscopy with polypectomy     X3   ESOPHAGOGASTRODUODENOSCOPY N/A 03/10/2015   Procedure: ESOPHAGOGASTRODUODENOSCOPY (EGD);  Surgeon: [name], MD;  Location: [facility];  Service: Endoscopy;  Laterality: N/A;   esophagus dilated     X3   EUS N/A 05/20/2015   Procedure: UPPER ENDOSCOPIC ULTRASOUND (EUS) RADIAL;  Surgeon: [name], MD;  Location: [facility];  Service: Endoscopy;   Laterality: N/A;   KNEE ARTHROSCOPY     bilaterally   LOOP RECORDER INSERTION N/A 08/13/2023   Procedure: LOOP RECORDER INSERTION;  Surgeon: [name], MD;  Location: [facility] INVASIVE CV LAB;  Service: Cardiovascular;  Laterality: N/A;   PILONIDAL CYST EXCISION     REPLACEMENT TOTAL KNEE Right    R   SHOULDER SURGERY     right shoulder   TONSILLECTOMY     total ethmoidectomy & sphenoidectomiy Bilateral 05/27/13   also reduction of turbinates   TOTAL KNEE ARTHROPLASTY     RIGHT   TOTAL SHOULDER ARTHROPLASTY Left 10/04/2017   Procedure: LEFT TOTAL SHOULDER ARTHROPLASTY;  Surgeon: [name], MD;  Location: [facility];  Service: Orthopedics;  Laterality: Left;   Patient Active Problem List   Diagnosis Date Noted   Class 3 obesity 08/12/2023   Acute CVA (cerebrovascular accident) (HCC) 08/11/2023   CKD stage 3a, GFR 45-59 ml/min (HCC) 08/11/2023   Insomnia 08/11/2023   Acute metabolic encephalopathy 01/17/2021   Encephalopathy 01/16/2021   History of squamous cell carcinoma    Status post total shoulder arthroplasty 10/04/2017   Pain in left knee 06/08/2017   Osteoarthritis of glenohumeral joint 05/21/2017   Esophageal candidiasis (HCC)  03/13/2015   Acute diastolic CHF (congestive heart failure) (HCC) 03/13/2015   Sinus tachycardia 03/10/2015   Hypothyroidism 03/10/2015   Hypoxemia    Uncontrolled diabetes mellitus type 2 without complications 03/07/2015   Chest pain on breathing    Intractable nausea and vomiting 03/06/2015   Abdominal pain 03/06/2015   Severe obesity (BMI >= 40) (HCC) 05/21/2013   Chronic sphenoidal sinusitis 05/21/2013   Hypokalemia 05/21/2013   Sinusitis, chronic 12/19/2012   History of anesthesia reaction 03/06/2012   Testosterone  deficiency 03/06/2012   Type II diabetes mellitus (HCC) 12/26/2010   FATIGUE 04/26/2010   EDEMA 04/26/2010   PAROXYSMAL NOCTURNAL DYSPNEA 04/26/2010   ESOPHAGEAL STRICTURE 07/17/2008   FATTY LIVER DISEASE 07/17/2008    Esophageal dysphagia 07/17/2008   Essential hypertension, benign 07/02/2008   Angioedema 06/08/2008   UNSPECIFIED ANEMIA 05/25/2008   URTICARIA 05/25/2008   HYPONATREMIA 05/03/2008   Disorder resulting from impaired renal function 05/03/2008   Hyperlipidemia 11/05/2007   NONSPEC ELEVATION OF LEVELS OF TRANSAMINASE/LDH 11/05/2007   SKIN CANCER, HX OF 11/05/2007   GOUT, UNSPECIFIED 02/05/2007   DYSMETABOLIC SYNDROME X 02/05/2007   GERD 02/05/2007    ONSET DATE: June 2025  REFERRING DIAG:  I63.9 (ICD-10-CM) - Acute CVA (cerebrovascular accident) (HCC)    THERAPY DIAG:  Muscle weakness (generalized)  Unsteadiness on feet  Rationale for Evaluation and Treatment: Rehabilitation  SUBJECTIVE:                                                                                                                                                                                             SUBJECTIVE STATEMENT: The pain is so back in my L knee.  It's been very bad the past two weeks; typically the shots help, but not this time.  I think I want to take a break from therapy because of the knee pain.  Pt accompanied by: self   PERTINENT HISTORY: [name] with MO, CKD, hypothyroidism, HTN and DM presented with acute vision changes to OSH. MRI brain there showed scattered subcortical (embolic) infarcts in left frontal and lateral cortex, left BG and right cerebellum. Transferred to [facility] for Neurology consultation. R TKA 15 years ago  PAIN:  Are you having pain? Yes: NPRS scale: 5-6/10 Pain location: L knee Pain description: pt did not report Aggravating factors: pt did not report Relieving factors: sleeve, tylenol    PRECAUTIONS: Fall  RED FLAGS: None   WEIGHT BEARING RESTRICTIONS: No  FALLS: Has patient fallen in last 6 months? No  LIVING ENVIRONMENT: Lives with: lives with their family Lives in: House/apartment-one-level house Stairs: 4 steps to enter Has following equipment  at home:  Single point cane, Walker - 4 wheeled, shower chair, and Grab bars  PLOF: Independent  PATIENT GOALS: improve balance, improve transfers  OBJECTIVE:    TODAY'S TREATMENT: 10/16/2023 Activity Comments  FTSTS  27.53 sec with BUE support, LLE is forward  TUG:  33 sec with cane Improved from 38 sec at eval  63M walk:  18.56 (1.77 ft/sec) Cane, antalgic gait pattern               PATIENT EDUCATION: Education details: Progress towards goals (limited due to pain); discussed fall prevention/safety with gait (use of quad cane or RW as needed due to pain).  Discussed holding PT due to pt's pain (per request) and return in approx 1 month to try to finish POC Person educated: Patient and Child(ren) Education method: Explanation Education comprehension: verbalized understanding  HOME EXERCISE PROGRAM: Access Code: [email or web address] URL: [email or web address] Date: 09/12/2023 Prepared by: Gellen April Earnie Starring  Exercises - Corner Balance Feet Together With Eyes Open  - 1 x daily - 7 x weekly - 2 sets - 30 sec hold - Corner Balance Feet Together: Eyes Open With Head Turns  - 1 x daily - 7 x weekly - 2 sets - 10 reps - Seated Active Straight-Leg Raise  - 1 x daily - 7 x weekly - 2 sets - 10 reps - Seated Hip Abduction with Resistance  - 1 x daily - 7 x weekly - 2 sets - 10 reps - Standing Hip Abduction with Counter Support  - 1 x daily - 7 x weekly - 3 sets - 10 reps - Gaze Stability (VOR) x 2 Feet Together With Horizontal Head Turns  - 1 x daily - 7 x weekly - 3 sets - 30 sec hold - Seated Long Arc Quad with Ankle Weight  - 2-3 x weekly - 2-3 sets - 10 reps - Seated Hip Flexion March with Ankle Weights  - 2-3 x weekly - 2-3 sets - 10 reps - Standing Hip Abduction with Ankle Weight  - 2-3 x weekly - 2-3 sets - 10 reps - Standing Toe Taps  - 2-3 x weekly - 2-3 sets - 10 reps   VESTIBULAR ASSESSMENT   GENERAL OBSERVATION: pt wears bifocals   OCULOMOTOR EXAM: Ocular Alignment:  Cover/Uncover Test: WNL Cover/Cross Cover Test: WNL   Ocular ROM: No Limitations   Spontaneous Nystagmus: absent   Gaze-Induced Nystagmus: absent   Smooth Pursuits: intact   Saccades: intact   Convergence/Divergence: 16 cm d/t L eye     VESTIBULAR - OCULAR REFLEX:    Slow VOR: Normal and Comment: c/o blurriness    VOR Cancellation: Comment: segmental head movement; difficult to discern if d/t neck or visual    Head-Impulse Test: HIT Right: covert HIT Left: negative    AUDITORY SCREEN:  Rinne Test WNL B    POSITIONAL TESTING:  Sit>supine: no dizziness  Left Roll Test: negative, pt c/o nausea  C/o nausea and pt requested to sit up after positional test  155/90 mmHg 97% spO2, 65bpm    Sitting head turns to targets 20: no dizziness Sitting head nods to targets 20: no dizziness Sitting horizontal VOR 30: no dizziness; difficulty maintaining consistent amplitude  Sitting vertical VOR 30: no dizziness; c/o some blurriness in the L eye; difficulty maintaining consistent amplitude   PATIENT EDUCATION: Education details: [facility] as he only have [facility]ST approved  Person educated: Patient Education method: Explanation Education comprehension:  verbalized understanding    Note: Objective measures were completed at Evaluation unless otherwise noted.  DIAGNOSTIC FINDINGS: MRI brain showed scattered subcortical infarcts in the left frontal, lateral cortex, left basal ganglia and right cerebellum.     Non-invasive angiography showed vertebral artery stenoses without concomitant infarct.  Echocardiogram at OSH showed normal EF, no cardiogenic source.  COGNITION: Overall cognitive status: reports short term memory issues   POSTURE: rounded shoulders and forward head  LOWER EXTREMITY ROM:     Active  Right Eval Left Eval  Hip flexion    Hip extension    Hip abduction    Hip adduction    Hip internal rotation    Hip external rotation     Knee flexion 0 -5  Knee extension    Ankle dorsiflexion 12 12  Ankle plantarflexion    Ankle inversion    Ankle eversion     (Blank rows = not tested)  LOWER EXTREMITY MMT:   BLE 4/5 gross strength to resisted tests w/ discomfort to left knee resistance  BED MOBILITY:  Not tested--reports independence w/ grab bar  TRANSFERS: Sit to stand: Complete Independence  Assistive device utilized: None     Stand to sit: Modified independence  Assistive device utilized: None     Chair to chair: Modified independence  Assistive device utilized: None       RAMP:  Not tested  CURB:  Findings: SBA-CGA  STAIRS: Findings: Comments: step-to w/ HR and supervision GAIT: Findings: Distance walked: clinic and Comments: LLE antalgic, decreased stride length/speed -Supervision level  FUNCTIONAL TESTS:  5 times sit to stand: 59 sec Timed up and go (TUG): 38 sec w/ cane 10 meter walk test: 29.78 sec = 0.75 ft/sec Berg Balance: 40/56      GOALS: Goals reviewed with patient? Yes  SHORT TERM GOALS: Target date: 09/26/2023    Patient will be independent in HEP to improve functional outcomes Baseline: Goal status: MET  2.  Demo improved BLE strength and reduced risk for falls per time 30 sec 5xSTS test Baseline: 59 sec; 16 sec Goal status: MET  3.  Demo improved mobility and reduced risk for falls by improved time to 25 sec TUG test Baseline: 38 sec w/ cane; 16.87 sec w/ NBQC Goal status: MET  4.  Report/demo improved transfers by ability to transfer from his recliner at home w/out LOB  Baseline: reports difficulty w/ instances of falling backwards  Goal status: NOT MET  LONG TERM GOALS: Target date: 10/24/2023    Demo low risk for falls per score 49/56 Berg Balance Test to improve safety with ADL Baseline: 40/56 Goal status: IN PROGRESS  2.  Demo improved mobility and reduced risk for falls by improved time to 13.5 sec TUG test Baseline: 38 sec w/ cane> 33.9 sec with cane  10/16/2023 Goal status: PARTIALLY MET, 10/16/2023  3.  Demo improved BLE strength and reduced risk for falls per time 20 sec 5xSTS test Baseline: 59 sec>27.53 sec 10/16/2023 Goal status: PARTIALLY MET, 10/16/2023  4.  Ambulation over various surfaces w/ curbs w/ least restrictive AD w/ modified independence to improve safety in community Baseline: CGA Goal status: IN PROGRESS  5.  Increase gait speed to 2.0 ft/sec to improve efficiency of community mobility Baseline: 0.75 ft/sec w/ cane>1.77 ft/sec cane Goal status: IN PROGRESS, 10/26/2023   ASSESSMENT:  CLINICAL IMPRESSION: Pt presents today with reports of continued L knee pain and swelling, limiting his HEP performance.  He requests to try to  hold PT visits at this time, due to knee pain (when he has had this pain in the past, extended periods of rest seem to help and he has orthopedic visit next week).  Assessed objective measures for 10th Visit PN, and pt has improved gait velocity, TUG, and FTSTS scores from eval (they are slowed from STG check).  Plans are to place pt on hold for 4-6 weeks and to return early October (recert needed at that time), to work towards completion of POC for improved balance, gait and functional mobility.  OBJECTIVE IMPAIRMENTS: Abnormal gait, decreased activity tolerance, decreased balance, decreased endurance, decreased knowledge of use of DME, difficulty walking, decreased strength, impaired flexibility, and pain.   ACTIVITY LIMITATIONS: carrying, lifting, bending, standing, stairs, transfers, reach over head, and locomotion level  PARTICIPATION LIMITATIONS: meal prep, cleaning, laundry, interpersonal relationship, driving, shopping, community activity, and yard work  PERSONAL FACTORS: Age, Time since onset of injury/illness/exacerbation, and 3+ comorbidities: PMH are also affecting patient's functional outcome.   REHAB POTENTIAL: Good  CLINICAL DECISION MAKING: Evolving/moderate complexity  EVALUATION  COMPLEXITY: Moderate  PLAN:  PT FREQUENCY: 2x/week  PT DURATION: other: 7 weeks  PLANNED INTERVENTIONS: 97750- Physical Performance Testing, 97110-Therapeutic exercises, 97530- Therapeutic activity, 97112- Neuromuscular re-education, 97535- Self Care, [id number]- Manual therapy, [phone]- Gait training, [phone]- Canalith repositioning, [id number]- Aquatic Therapy, [phone]- Electrical stimulation (unattended), and 20560 (1-2 muscles), 20561 (3+ muscles)- Dry Needling  PLAN FOR NEXT SESSION: Hold on PT for 4-6 weeks to allow time for knee pain to improve; pt to return early October (will need recert to complete POC-he has 5 visits left in [place])   Miner, PT 10/16/23 10:47 AM Phone: [phone] Fax: [phone]  [facility] [address] [place], [place] [address] Phone # [phone] Fax # [phone]

## 2023-10-17 ENCOUNTER — Ambulatory Visit: Payer: Self-pay | Admitting: Cardiology

## 2023-10-17 LAB — CUP PACEART REMOTE DEVICE CHECK
Date Time Interrogation Session: 20250901100415
Implantable Pulse Generator Implant Date: 20250630

## 2023-10-18 ENCOUNTER — Ambulatory Visit: Admitting: Physical Therapy

## 2023-10-22 ENCOUNTER — Ambulatory Visit: Admitting: Physical Therapy

## 2023-10-23 NOTE — Progress Notes (Signed)
 Remote Loop Recorder Transmission

## 2023-10-24 ENCOUNTER — Ambulatory Visit: Admitting: Physical Therapy

## 2023-10-29 ENCOUNTER — Encounter: Payer: Self-pay | Admitting: Diagnostic Neuroimaging

## 2023-10-29 ENCOUNTER — Ambulatory Visit (INDEPENDENT_AMBULATORY_CARE_PROVIDER_SITE_OTHER): Admitting: Diagnostic Neuroimaging

## 2023-10-29 ENCOUNTER — Telehealth: Payer: Self-pay | Admitting: Diagnostic Neuroimaging

## 2023-10-29 VITALS — BP 130/72 | HR 62 | Ht 68.0 in | Wt 264.0 lb

## 2023-10-29 DIAGNOSIS — I63411 Cerebral infarction due to embolism of right middle cerebral artery: Secondary | ICD-10-CM

## 2023-10-29 DIAGNOSIS — I63111 Cerebral infarction due to embolism of right vertebral artery: Secondary | ICD-10-CM

## 2023-10-29 NOTE — Progress Notes (Signed)
 [facility]  PATIENT: Terry Lyons DOB: December 21, 1945  REFERRING CLINICIAN: [name],* HISTORY FROM: patient  REASON FOR VISIT: new consult   HISTORICAL  CHIEF COMPLAINT:  Chief Complaint  Patient presents with   HSP FU     Rm 6 with daughter  Pt is well, reports residual word finding difficulty and imbalance, overall he is stable. No other concerns.     HISTORY OF PRESENT ILLNESS:   78 year old male here for evaluation of stroke Hospital follow-up.  08/10/2023 patient had onset of double vision, word finding difficulty, nausea symptoms.  Initially went to [facility], had stroke workup and then transferred to [facility] for further evaluation and treatment.  Was found to have multiple embolic infarcts.  Has had loop recorder placed.  Completed aspirin  and Plavix  and now on Plavix  alone.  Tolerating medications.  Symptoms are improving.  He lives at home taking care of his wife who has Parkinson's and dementia.  Daughter and other family are nearby to help out.   REVIEW OF SYSTEMS: Full 14 system review of systems performed and negative with exception of: As per HPI.  ALLERGIES: Allergies  Allergen Reactions   Sulfa Antibiotics     UNSPECIFIED REACTION    Bupropion Hcl Other (See Comments)    REACTION: insomnia   Codeine Nausea Only   Escitalopram Oxalate Other (See Comments)    REACTION: insomnia   Sertraline Hcl Other (See Comments)    REACTION: insomnia    HOME MEDICATIONS: Outpatient Medications Prior to Visit  Medication Sig Dispense Refill   Acetaminophen  500 MG coapsule Take 1 capsule by mouth 4 (four) times daily.     allopurinol  (ZYLOPRIM ) 300 MG tablet Take 300 mg by mouth daily. (Patient taking differently: Take 300 mg by mouth daily.)     carvedilol  (COREG ) 25 MG tablet Take 1 tablet (25 mg total) by mouth 2 (two) times daily with a meal. 180 tablet 3   cetirizine  (ZYRTEC ) 10 MG tablet Take 10 mg by mouth  daily.     clopidogrel  (PLAVIX ) 75 MG tablet Take 1 tablet (75 mg total) by mouth daily. 90 tablet 3   famotidine  (PEPCID ) 20 MG tablet Take 40 mg by mouth at bedtime.     gabapentin  (NEURONTIN ) 300 MG capsule Take 300 mg by mouth 2 (two) times daily as needed (neuropathic pain). (Patient taking differently: Take 300 mg by mouth at bedtime.)     HYDROcodone -acetaminophen  (NORCO/VICODIN) 5-325 MG tablet Take by mouth as needed.     levothyroxine  (SYNTHROID ) 50 MCG tablet Take 50-100 mcg by mouth daily before breakfast. Takes 100 mcg on Sunday, 50 mcg all other days     melatonin 3 MG TABS tablet Take 3 mg by mouth at bedtime.     Menthol -Methyl Salicylate (THERA-GESIC EX) Apply 1 application topically 4 (four) times daily as needed (knee pain.).      metFORMIN  (GLUCOPHAGE ) 500 MG tablet Take 500 mg by mouth 2 (two) times daily with a meal.     mirtazapine  (REMERON ) 30 MG tablet Take 15 mg by mouth at bedtime.     ondansetron  (ZOFRAN ) 4 MG tablet Take 1 tablet (4 mg total) by mouth every 8 (eight) hours as needed for nausea or vomiting. 30 tablet 0   pantoprazole  (PROTONIX ) 20 MG tablet Take 1 tablet (20 mg total) by mouth daily. 90 tablet 3   pravastatin  (PRAVACHOL ) 20 MG tablet Take 20 mg by mouth at bedtime.  QUEtiapine  (SEROQUEL ) 25 MG tablet Take 12.5 mg by mouth at bedtime.     tamsulosin  (FLOMAX ) 0.4 MG CAPS capsule Take 1 capsule (0.4 mg total) by mouth daily after supper. 30 capsule 0   traZODone  (DESYREL ) 100 MG tablet Take 100 mg by mouth at bedtime.     aspirin  EC 81 MG tablet Take 81 mg by mouth daily. (Patient not taking: Reported on 10/29/2023)     fenofibrate  160 MG tablet Take 1 tablet (160 mg total) by mouth daily. (Patient not taking: Reported on 10/29/2023) 90 tablet 3   potassium chloride  (K-DUR) 10 MEQ tablet Take 10 mEq by mouth daily. (Patient not taking: Reported on 10/29/2023)     No facility-administered medications prior to visit.    PAST MEDICAL HISTORY: Past  Medical History:  Diagnosis Date   Allergy    Arthritis    CHF (congestive heart failure) (HCC)    hospitalized in 2017; was diagnosed with an acute excacerbation.    CKD stage 3a, GFR 45-59 ml/min (HCC)    Colon polyps    adenomatous   Diabetes (HCC)    Diabetes mellitus    GERD (gastroesophageal reflux disease)    Gout    History of basal cell carcinoma    W-S Derm   History of squamous cell carcinoma    Hyperlipidemia    Hypertension    Obesity    Rosacea    Tachycardia     PAST SURGICAL HISTORY: Past Surgical History:  Procedure Laterality Date   colonoscopy with polypectomy     X3   ESOPHAGOGASTRODUODENOSCOPY N/A 03/10/2015   Procedure: ESOPHAGOGASTRODUODENOSCOPY (EGD);  Surgeon: [name], MD;  Location: [facility];  Service: Endoscopy;  Laterality: N/A;   esophagus dilated     X3   EUS N/A 05/20/2015   Procedure: UPPER ENDOSCOPIC ULTRASOUND (EUS) RADIAL;  Surgeon: Toribio SHAUNNA Cedar, MD;  Location: [facility];  Service: Endoscopy;  Laterality: N/A;   KNEE ARTHROSCOPY     bilaterally   LOOP RECORDER INSERTION N/A 08/13/2023   Procedure: LOOP RECORDER INSERTION;  Surgeon: [name], MD;  Location: [facility] INVASIVE CV LAB;  Service: Cardiovascular;  Laterality: N/A;   PILONIDAL CYST EXCISION     REPLACEMENT TOTAL KNEE Right    R   SHOULDER SURGERY     right shoulder   TONSILLECTOMY     total ethmoidectomy & sphenoidectomiy Bilateral 05/27/13   also reduction of turbinates   TOTAL KNEE ARTHROPLASTY     RIGHT   TOTAL SHOULDER ARTHROPLASTY Left 10/04/2017   Procedure: LEFT TOTAL SHOULDER ARTHROPLASTY;  Surgeon: [name], MD;  Location: [facility] OR;  Service: Orthopedics;  Laterality: Left;    FAMILY HISTORY: Family History  Problem Relation Age of Onset   Lung cancer Mother        smoker   Heart disease Father        Rheumatic Heart Disease   Heart attack Brother 22       CBAG   Heart attack Paternal Grandmother 60   Heart attack Paternal Grandfather 26    Lung cancer Maternal Aunt        smoker   Liver disease Neg Hx    Esophageal cancer Neg Hx    Colon cancer Neg Hx     SOCIAL HISTORY: Social History   Socioeconomic History   Marital status: Married    Spouse name: Not on file   Number of children: 2   Years of education: Not on file  Highest education level: Not on file  Occupational History   Occupation: retired  Tobacco Use   Smoking status: Former    Current packs/day: 0.00    Types: Cigarettes    Quit date: 10/17/1973    Years since quitting: 50.0   Smokeless tobacco: Never  Vaping Use   Vaping status: Never Used  Substance and Sexual Activity   Alcohol  use: No    Alcohol /week: 0.0 standard drinks of alcohol    Drug use: No   Sexual activity: Not on file  Other Topics Concern   Not on file  Social History Narrative   Pt's states no medical or surgical changes since previsit or office visit.   Social Drivers of Corporate investment banker Strain: Not on file  Food Insecurity: No Food Insecurity (08/11/2023)   Hunger Vital Sign    Worried About Running Out of Food in the Last Year: Never true    Ran Out of Food in the Last Year: Never true  Transportation Needs: No Transportation Needs (08/11/2023)   PRAPARE - Administrator, Civil Service (Medical): No    Lack of Transportation (Non-Medical): No  Physical Activity: Not on file  Stress: Not on file  Social Connections: Unknown (06/26/2021)   Received from [facility]   Social Network    Social Network: Not on file  Intimate Partner Violence: Not At Risk (08/11/2023)   Humiliation, Afraid, Rape, and Kick questionnaire    Fear of Current or Ex-Partner: No    Emotionally Abused: No    Physically Abused: No    Sexually Abused: No     PHYSICAL EXAM  GENERAL EXAM/CONSTITUTIONAL: Vitals:  Vitals:   10/29/23 1008  BP: 130/72  Pulse: 62  Weight: 264 lb (119.7 kg)  Height: 5' 8 (1.727 m)   Body mass index is 40.14 kg/m. Wt Readings from  Last 3 Encounters:  10/29/23 264 lb (119.7 kg)  01/24/23 266 lb (120.7 kg)  12/20/22 266 lb (120.7 kg)   Patient is in no distress; well developed, nourished and groomed; neck is supple  CARDIOVASCULAR: Examination of carotid arteries is normal; no carotid bruits Regular rate and rhythm, BLOWING SYSTOLIC MURMUR Examination of peripheral vascular system by observation and palpation is normal  EYES: Ophthalmoscopic exam of optic discs and posterior segments is normal; no papilledema or hemorrhages No results found.  MUSCULOSKELETAL: Gait, strength, tone, movements noted in Neurologic exam below  NEUROLOGIC: MENTAL STATUS:      No data to display         awake, alert, oriented to person, place and time recent and remote memory intact normal attention and concentration language fluent, comprehension intact, naming intact fund of knowledge appropriate  CRANIAL NERVE:  2nd - no papilledema on fundoscopic exam 2nd, 3rd, 4th, 6th - pupils equal and reactive to light, visual fields full to confrontation, extraocular muscles intact, no nystagmus 5th - facial sensation symmetric 7th - facial strength symmetric 8th - hearing intact 9th - palate elevates symmetrically, uvula midline 11th - shoulder shrug symmetric 12th - tongue protrusion midline  MOTOR:  normal bulk and tone, full strength in the BUE, BLE; SLIGHT WEAKNESS / INCOORDINATION IN LUE AND LLE  SENSORY:  normal and symmetric to light touch, temperature, vibration; EXCEPT DECR IN BILATERAL ANKLES  COORDINATION:  finger-nose-finger, fine finger movements normal  REFLEXES:  deep tendon reflexes TRACE and symmetric  GAIT/STATION:  narrow based gait; ANTALGIC GAIT     DIAGNOSTIC DATA (LABS, IMAGING, TESTING) -  I reviewed patient records, labs, notes, testing and imaging myself where available.  Lab Results  Component Value Date   WBC 5.5 08/12/2023   HGB 13.3 08/12/2023   HCT 39.4 08/12/2023   MCV 93.6  08/12/2023   PLT 239 08/12/2023      Component Value Date/Time   NA 137 08/12/2023 0717   NA 140 02/20/2020 1213   K 3.7 08/12/2023 0717   CL 105 08/12/2023 0717   CO2 25 08/12/2023 0717   GLUCOSE 112 (H) 08/12/2023 0717   BUN 16 08/12/2023 0717   BUN 13 02/20/2020 1213   CREATININE 1.24 08/12/2023 0717   CALCIUM  8.5 (L) 08/12/2023 0717   PROT 6.3 (L) 08/12/2023 0717   PROT 6.5 02/20/2020 1213   ALBUMIN 3.2 (L) 08/12/2023 0717   ALBUMIN 4.2 02/20/2020 1213   AST 21 08/12/2023 0717   ALT 16 08/12/2023 0717   ALKPHOS 45 08/12/2023 0717   BILITOT 1.1 08/12/2023 0717   BILITOT 0.4 02/20/2020 1213   GFRNONAA 59 (L) 08/12/2023 0717   GFRAA 62 02/20/2020 1213   Lab Results  Component Value Date   CHOL 133 02/20/2020   HDL 47 02/20/2020   LDLCALC 46 02/20/2020   LDLDIRECT 104.7 12/21/2010   TRIG 260 (H) 02/20/2020   CHOLHDL 2.8 02/20/2020   Lab Results  Component Value Date   HGBA1C 5.8 (H) 01/16/2021   Lab Results  Component Value Date   VITAMINB12 227 08/12/2023   Lab Results  Component Value Date   TSH 3.582 08/12/2023    08/10/23 CT head [I reviewed images myself and agree with interpretation. -VRP]  1.  No acute intracranial abnormality.  2.  Microvascular ischemic changes.     ASSESSMENT AND PLAN  78 y.o. year old right handed male here with hypertension, diabetes, hyperlipidemia:   Dx:  1. Cerebrovascular accident (CVA) due to embolism of right middle cerebral artery (HCC)   2. Cerebrovascular accident (CVA) due to embolism of right vertebral artery (HCC)     PLAN:  right frontal infarct and left frontal infarct left basal ganglia and right cerebellum (symptoms --> vision changes, double vision, word finding difficulty) Etiology:  likely cardio embolic given multiple vascular territories involvement Code Stroke CT head No acute abnormality. ASPECTS 10.    CTA head & neck no vascular occlusion but short segment of high-grade stenosis was noted at  the origin of the right vertebral artery and V4 segment MRI  small foci of acute infarction involving right frontal cortex, left frontal cortex and subcortical white matter, left basal ganglia, and right cerebellum  2D Echo EF 60 to 65% with mild aortic stenosis, and mildly enlarged left atrium  LE Venous duplex -no DVT  LDL 58 HgbA1c 5.9 aspirin  81 mg daily prior to admission, then on aspirin  81 mg daily and clopidogrel  75 mg daily for  3 weeks and now plavix  75mg  alone Continue monitoring implanted loop recorder (rule out atrial fibrillation) Continue home exercises for PT; follow-up with orthopedics regarding left knee pain  Hypertension Continue coreg    Hyperlipidemia LDL 58, goal < 70 Continue pravastatin     Diabetes type II Controlled HgbA1c 5.8, goal < 7.0 Continue metformin   Return for pending if symptoms worsen or fail to improve, return to PCP.  I reviewed images, labs, notes, records myself. I summarized findings and reviewed with patient, for this high risk condition (embolic stroke) requiring high complexity decision making.    EDUARD FABIENE HANLON, MD 10/29/2023, 11:00 AM Certified in Neurology,  Neurophysiology and Neuroimaging  [facility] [address] [place], [place] [address] [phone]

## 2023-10-29 NOTE — Telephone Encounter (Signed)
 Patient's daugher Chrislynn called to informed patient will arrive for appointment at 9:50 am

## 2023-10-29 NOTE — Patient Instructions (Signed)
-  continue current medications

## 2023-11-07 NOTE — Progress Notes (Signed)
 Remote Loop Recorder Transmission

## 2023-11-15 ENCOUNTER — Ambulatory Visit (INDEPENDENT_AMBULATORY_CARE_PROVIDER_SITE_OTHER)

## 2023-11-15 DIAGNOSIS — I639 Cerebral infarction, unspecified: Secondary | ICD-10-CM

## 2023-11-15 LAB — CUP PACEART REMOTE DEVICE CHECK
Date Time Interrogation Session: 20251002100313
Implantable Pulse Generator Implant Date: 20250630

## 2023-11-16 NOTE — Progress Notes (Signed)
 Remote Loop Recorder Transmission

## 2023-11-18 ENCOUNTER — Ambulatory Visit: Payer: Self-pay | Admitting: Cardiology

## 2023-11-20 ENCOUNTER — Ambulatory Visit: Attending: Family Medicine | Admitting: Physical Therapy

## 2023-11-20 ENCOUNTER — Encounter: Payer: Self-pay | Admitting: Physical Therapy

## 2023-11-20 DIAGNOSIS — R29898 Other symptoms and signs involving the musculoskeletal system: Secondary | ICD-10-CM | POA: Diagnosis present

## 2023-11-20 DIAGNOSIS — R2681 Unsteadiness on feet: Secondary | ICD-10-CM | POA: Insufficient documentation

## 2023-11-20 DIAGNOSIS — M6281 Muscle weakness (generalized): Secondary | ICD-10-CM | POA: Insufficient documentation

## 2023-11-20 DIAGNOSIS — R262 Difficulty in walking, not elsewhere classified: Secondary | ICD-10-CM | POA: Diagnosis present

## 2023-11-20 NOTE — Patient Instructions (Signed)
Aquatic Therapy: What to Expect!  Where:  [facility] [address] [place], [place] [address] [phone]           How to Prepare:  If you require assistance with dressing, with transportation (ie: wheel chair), or toileting, a caregiver must attend the entire session with you (unless your primary therapists feels this is not necessary).   If there is thunder during your appointment, you will be asked to leave pool area. You have the option to finish your session in the physical therapy area near the gym. Masks in the pool area are optional. Your face will remain dry during your session, so you are welcome to keep your mask on, if desired. You will be spaced at least 6 feet from other aquatic patients.  Please bring your own swim towel to dry off with.   There are Men's and Women's locker rooms with showers, as well as gender neutral bathrooms in the pool area.  Please arrive IN YOUR SUIT and a few minutes prior to your appointment - this helps to avoid delays in starting your session. Head to the pool and await your appointment on the bench on the pool deck. Please make sure to attend to any toileting needs prior to entering the pool. Once on the pool deck your therapist will ask you to sign the Patient  Consent and Assignment of Benefits form. Your therapist may take your blood pressure prior to, during and after your session if indicated. We usually try and create a home exercise program based on activities we do in the pool. Some patients do not want to or do not have the ability to participate in an aquatic home program - this is not a barrier in any way to you participating in aquatic therapy as part of your current therapy plan!  Appointments:  All sessions are 45 minutes  About the pool: Entering the pool: Your therapist will assist you if needed; there are two ways to enter the pool - stairs or a mechanical lift. Your therapist will determine  the most appropriate way for you. Water temperature is usually around 91-95.  There is a lap pool with a temperature around 84 There may be other therapists and patients in the pool at the same time.   Contact Info:            To cancel appointment, please call [facility] at [phone] If you are running late, please call [facility] at [phone]                   

## 2023-11-20 NOTE — Therapy (Signed)
 OUTPATIENT PHYSICAL THERAPY NEURO TREATMENT/RECERT   Patient Name: Terry Lyons MRN: [id number] DOB:[birth year], 78 y.o., male Today's Date: 11/20/2023   PCP: [name], MD REFERRING PROVIDER: [name], MD/// [name], MD    END OF SESSION:  PT End of Session - 11/20/23 1108     Visit Number 11    Number of Visits 14    Date for Recertification  12/18/23    [facility]ization Type [facility]    Authorization Time Period 08/20/2023-12/18/2023    Authorization - Visit Number 11    Authorization - Number of Visits 15    Progress Note Due on Visit 10    PT Start Time 1105    PT Stop Time 1145    PT Time Calculation (min) 40 min    Equipment Utilized During Treatment Gait belt   wearing L knee brace   Activity Tolerance Patient limited by pain    Behavior During Therapy [facility] for tasks assessed/performed              Past Medical History:  Diagnosis Date   Allergy    Arthritis    CHF (congestive heart failure) (HCC)    hospitalized in 2017; was diagnosed with an acute excacerbation.    CKD stage 3a, GFR 45-59 ml/min (HCC)    Colon polyps    adenomatous   Diabetes (HCC)    Diabetes mellitus    GERD (gastroesophageal reflux disease)    Gout    History of basal cell carcinoma    W-S Derm   History of squamous cell carcinoma    Hyperlipidemia    Hypertension    Obesity    Rosacea    Tachycardia    Past Surgical History:  Procedure Laterality Date   colonoscopy with polypectomy     X3   ESOPHAGOGASTRODUODENOSCOPY N/A 03/10/2015   Procedure: ESOPHAGOGASTRODUODENOSCOPY (EGD);  Surgeon: [name], MD;  Location: [facility];  Service: Endoscopy;  Laterality: N/A;   esophagus dilated     X3   EUS N/A 05/20/2015   Procedure: UPPER ENDOSCOPIC ULTRASOUND (EUS) RADIAL;  Surgeon: [name], MD;  Location: [facility];  Service: Endoscopy;  Laterality: N/A;   KNEE ARTHROSCOPY     bilaterally   LOOP RECORDER INSERTION N/A 08/13/2023   Procedure: LOOP  RECORDER INSERTION;  Surgeon: [name], MD;  Location: [facility] INVASIVE CV LAB;  Service: Cardiovascular;  Laterality: N/A;   PILONIDAL CYST EXCISION     REPLACEMENT TOTAL KNEE Right    R   SHOULDER SURGERY     right shoulder   TONSILLECTOMY     total ethmoidectomy & sphenoidectomiy Bilateral 05/27/13   also reduction of turbinates   TOTAL KNEE ARTHROPLASTY     RIGHT   TOTAL SHOULDER ARTHROPLASTY Left 10/04/2017   Procedure: LEFT TOTAL SHOULDER ARTHROPLASTY;  Surgeon: [name], MD;  Location: [facility] OR;  Service: Orthopedics;  Laterality: Left;   Patient Active Problem List   Diagnosis Date Noted   Class 3 obesity (HCC) 08/12/2023   Acute CVA (cerebrovascular accident) (HCC) 08/11/2023   CKD stage 3a, GFR 45-59 ml/min (HCC) 08/11/2023   Insomnia 08/11/2023   Acute metabolic encephalopathy 01/17/2021   Encephalopathy 01/16/2021   History of squamous cell carcinoma    Status post total shoulder arthroplasty 10/04/2017   Pain in left knee 06/08/2017   Osteoarthritis of glenohumeral joint 05/21/2017   Esophageal candidiasis (HCC) 03/13/2015   Acute diastolic CHF (congestive heart failure) (HCC) 03/13/2015   Sinus  tachycardia 03/10/2015   Hypothyroidism 03/10/2015   Hypoxemia    Uncontrolled diabetes mellitus type 2 without complications 03/07/2015   Chest pain on breathing    Intractable nausea and vomiting 03/06/2015   Abdominal pain 03/06/2015   Severe obesity (BMI >= 40) (HCC) 05/21/2013   Chronic sphenoidal sinusitis 05/21/2013   Hypokalemia 05/21/2013   Sinusitis, chronic 12/19/2012   History of anesthesia reaction 03/06/2012   Testosterone  deficiency 03/06/2012   Type II diabetes mellitus (HCC) 12/26/2010   FATIGUE 04/26/2010   EDEMA 04/26/2010   PAROXYSMAL NOCTURNAL DYSPNEA 04/26/2010   ESOPHAGEAL STRICTURE 07/17/2008   FATTY LIVER DISEASE 07/17/2008   Esophageal dysphagia 07/17/2008   Essential hypertension, benign 07/02/2008   Angioedema 06/08/2008   UNSPECIFIED  ANEMIA 05/25/2008   URTICARIA 05/25/2008   HYPONATREMIA 05/03/2008   Disorder resulting from impaired renal function 05/03/2008   Hyperlipidemia 11/05/2007   NONSPEC ELEVATION OF LEVELS OF TRANSAMINASE/LDH 11/05/2007   SKIN CANCER, HX OF 11/05/2007   GOUT, UNSPECIFIED 02/05/2007   DYSMETABOLIC SYNDROME X 02/05/2007   GERD 02/05/2007    ONSET DATE: June 2025  REFERRING DIAG:  I63.9 (ICD-10-CM) - Acute CVA (cerebrovascular accident) (HCC)    THERAPY DIAG:  Muscle weakness (generalized)  Unsteadiness on feet  Difficulty in walking, not elsewhere classified  Other symptoms and signs involving the musculoskeletal system  Rationale for Evaluation and Treatment: Rehabilitation  SUBJECTIVE:                                                                                                                                                                                             SUBJECTIVE STATEMENT: Still having L knee pain.  Have not had any falls, just some staggering. The knee brace is very helpful. Pt accompanied by: self   PERTINENT HISTORY: 78 y.o. M with MO, CKD, hypothyroidism, HTN and DM presented with acute vision changes to OSH. MRI brain there showed scattered subcortical (embolic) infarcts in left frontal and lateral cortex, left BG and right cerebellum. Transferred to [facility] for Neurology consultation. R TKA 15 years ago  PAIN:  Are you having pain? Yes: NPRS scale: 5-6/10 Pain location: L knee Pain description: pt did not report Aggravating factors: pt did not report Relieving factors: sleeve, tylenol    PRECAUTIONS: Fall  RED FLAGS: None   WEIGHT BEARING RESTRICTIONS: No  FALLS: Has patient fallen in last 6 months? No  LIVING ENVIRONMENT: Lives with: lives with their family Lives in: House/apartment-one-level house Stairs: 4 steps to enter Has following equipment at home: Single point cane, Environmental consultant - 4 wheeled, shower chair, and Grab bars  PLOF:  Independent  PATIENT GOALS:  improve balance, improve transfers  OBJECTIVE:   Reviewed pt's HEP; overall, pt reports he has not done these exercises since last at PT, due to knee pain.  TODAY'S TREATMENT: 11/20/2023 Activity Comments  Review of pt's HEP: Corner balance Romberg + EO head turns/nods Standing hip abduction x 10 (no weight) Alternating step taps  Mild instability  Good form with standing balance ex  Seated LAQ x 10 Seated march x 10 Seated hip abduction Gentle L knee flexion/extension EOM not weight 5# BLE, then no weight LLE   Standing hip extension x 10 reps             HOME EXERCISE PROGRAM: Access Code: [email or web address] URL: [email or web address] Date: 11/20/2023 Prepared by: [facility] - Outpatient  Rehab - [facility]  Exercises - Corner Balance Feet Together: Eyes Open With Head Turns  - 1 x daily - 5 x weekly - 2 sets - 10 reps - Seated Hip Abduction with Resistance  - 1 x daily - 2-3 x weekly - 2 sets - 10 reps - Standing Hip Abduction with Counter Support  - 1 x daily - 5 x weekly - 3 sets - 10 reps - Seated Long Arc Quad with Ankle Weight  - 2-3 x weekly - 2-3 sets - 10 reps - Seated Hip Flexion March with Ankle Weights  - 2-3 x weekly - 2-3 sets - 10 reps - Standing Toe Taps  - 2-3 x weekly - 2-3 sets - 10 reps - Standing Hip Extension with Counter Support  - 1 x daily - 5 x weekly - 3 sets - 10 reps  TREATMENT: 10/16/2023 Activity Comments  FTSTS  27.53 sec with BUE support, LLE is forward  TUG:  33 sec with cane Improved from 38 sec at eval  33M walk:  18.56 (1.77 ft/sec) Cane, antalgic gait pattern               PATIENT EDUCATION: Education details: Discussed aquatic therapy and logistics/benefits/plan for this; pt ultimately decides he would rather focus on in-clinic sessions.  He doesn't think he will be able to get away from wife (who has Alzheimer's) long enough for aquatic sessions.  Discussed POC, recert today and  consolidated/updated HEP.   (reprinted full updated HEP).  Discussed importance of consistent HEP performance  Person educated: Patient  Education method: Explanation Education comprehension: verbalized understanding    VESTIBULAR ASSESSMENT   GENERAL OBSERVATION: pt wears bifocals   OCULOMOTOR EXAM: Ocular Alignment: Cover/Uncover Test: WNL Cover/Cross Cover Test: WNL   Ocular ROM: No Limitations   Spontaneous Nystagmus: absent   Gaze-Induced Nystagmus: absent   Smooth Pursuits: intact   Saccades: intact   Convergence/Divergence: 16 cm d/t L eye     VESTIBULAR - OCULAR REFLEX:    Slow VOR: Normal and Comment: c/o blurriness    VOR Cancellation: Comment: segmental head movement; difficult to discern if d/t neck or visual    Head-Impulse Test: HIT Right: covert HIT Left: negative    AUDITORY SCREEN:  Rinne Test WNL B    POSITIONAL TESTING:  Sit>supine: no dizziness  Left Roll Test: negative, pt c/o nausea  C/o nausea and pt requested to sit up after positional test  155/90 mmHg 97% spO2, 65bpm    Sitting head turns to targets 20: no dizziness Sitting head nods to targets 20: no dizziness Sitting horizontal VOR 30: no dizziness; difficulty maintaining consistent amplitude  Sitting vertical VOR 30: no dizziness; c/o some blurriness in the L eye; difficulty  maintaining consistent amplitude   PATIENT EDUCATION: Education details: [facility] as he only have [facility] approved  Person educated: Patient Education method: Explanation Education comprehension: verbalized understanding    Note: Objective measures were completed at Evaluation unless otherwise noted.  DIAGNOSTIC FINDINGS: MRI brain showed scattered subcortical infarcts in the left frontal, lateral cortex, left basal ganglia and right cerebellum.     Non-invasive angiography showed vertebral artery stenoses without concomitant infarct.  Echocardiogram at OSH showed normal  EF, no cardiogenic source.  COGNITION: Overall cognitive status: reports short term memory issues   POSTURE: rounded shoulders and forward head  LOWER EXTREMITY ROM:     Active  Right Eval Left Eval  Hip flexion    Hip extension    Hip abduction    Hip adduction    Hip internal rotation    Hip external rotation    Knee flexion 0 -5  Knee extension    Ankle dorsiflexion 12 12  Ankle plantarflexion    Ankle inversion    Ankle eversion     (Blank rows = not tested)  LOWER EXTREMITY MMT:   BLE 4/5 gross strength to resisted tests w/ discomfort to left knee resistance  BED MOBILITY:  Not tested--reports independence w/ grab bar  TRANSFERS: Sit to stand: Complete Independence  Assistive device utilized: None     Stand to sit: Modified independence  Assistive device utilized: None     Chair to chair: Modified independence  Assistive device utilized: None       RAMP:  Not tested  CURB:  Findings: SBA-CGA  STAIRS: Findings: Comments: step-to w/ HR and supervision GAIT: Findings: Distance walked: clinic and Comments: LLE antalgic, decreased stride length/speed -Supervision level  FUNCTIONAL TESTS:  5 times sit to stand: 59 sec Timed up and go (TUG): 38 sec w/ cane 10 meter walk test: 29.78 sec = 0.75 ft/sec Berg Balance: 40/56    [facility] PT Assessment - 11/20/23 1138       Standardized Balance Assessment   Standardized Balance Assessment Berg Balance Test      Berg Balance Test   Sit to Stand Able to stand without using hands and stabilize independently    Standing Unsupported Able to stand safely 2 minutes    Sitting with Back Unsupported but Feet Supported on Floor or Stool Able to sit safely and securely 2 minutes    Stand to Sit Sits safely with minimal use of hands    Transfers Able to transfer safely, minor use of hands    Standing Unsupported with Eyes Closed Able to stand 10 seconds safely    Standing Unsupported with Feet Together Able to place feet  together independently and stand 1 minute safely    From Standing, Reach Forward with Outstretched Arm Can reach forward >12 cm safely (5)    From Standing Position, Pick up Object from Floor Unable to try/needs assist to keep balance    From Standing Position, Turn to Look Behind Over each Shoulder Turn sideways only but maintains balance    Turn 360 Degrees Able to turn 360 degrees safely but slowly    Standing Unsupported, Alternately Place Feet on Step/Stool Able to stand independently and complete 8 steps >20 seconds    Standing Unsupported, One Foot in Front Able to take small step independently and hold 30 seconds    Standing on One Leg Tries to lift leg/unable to hold 3 seconds but remains standing independently  Total Score 41           GOALS: Goals reviewed with patient? Yes  SHORT TERM GOALS: Target date: 09/26/2023    Patient will be independent in HEP to improve functional outcomes Baseline: Goal status: MET  2.  Demo improved BLE strength and reduced risk for falls per time 30 sec 5xSTS test Baseline: 59 sec; 16 sec Goal status: MET  3.  Demo improved mobility and reduced risk for falls by improved time to 25 sec TUG test Baseline: 38 sec w/ cane; 16.87 sec w/ NBQC Goal status: MET  4.  Report/demo improved transfers by ability to transfer from his recliner at home w/out LOB  Baseline: reports difficulty w/ instances of falling backwards  Goal status: NOT MET  LONG TERM GOALS: Target date: 10/24/2023>UPDATED 12/18/2023    Demo low risk for falls per score 49/56 Berg Balance Test to improve safety with ADL Baseline: 40/56>41/56 89/03/7972 Goal status: IN PROGRESS, 11/20/2023  2.  Demo improved mobility and reduced risk for falls by improved time to 13.5 sec TUG test Baseline: 38 sec w/ cane> 33.9 sec with cane 10/16/2023 Goal status: PARTIALLY MET, 10/16/2023  3.  Demo improved BLE strength and reduced risk for falls per time 20 sec 5xSTS test Baseline: 59  sec>27.53 sec 10/16/2023 Goal status: PARTIALLY MET, 10/16/2023  4.  Ambulation over various surfaces w/ curbs w/ least restrictive AD w/ modified independence to improve safety in community Baseline: CGA Goal status: IN PROGRESS  5.  Increase gait speed to 2.0 ft/sec to improve efficiency of community mobility Baseline: 0.75 ft/sec w/ cane>1.77 ft/sec cane Goal status: IN PROGRESS, 10/26/2023   ASSESSMENT:  CLINICAL IMPRESSION: Pt presents today wearing L knee brace and stating that knee pain is better with knee brace on; he does report he hasn't done HEP since last PT session.  He is curious about aquatic therapy; however, he ultimately decides against it, worried that it will take too much time away from wife (with Alzheimer's).  He has not yet met LTGS (likely knee pain and being away from therapy x 1 month contributing), and PT updated/consolidated HEP this visit to help with pt's compliance to address strength and balance.  He is agreeable to trying additional therapy 1x/wk for 4 more visits, to address strength and balance towards goals for  decreased fall risk and improved overall functional mobility.  Recert completed today.  OBJECTIVE IMPAIRMENTS: Abnormal gait, decreased activity tolerance, decreased balance, decreased endurance, decreased knowledge of use of DME, difficulty walking, decreased strength, impaired flexibility, and pain.   ACTIVITY LIMITATIONS: carrying, lifting, bending, standing, stairs, transfers, reach over head, and locomotion level  PARTICIPATION LIMITATIONS: meal prep, cleaning, laundry, interpersonal relationship, driving, shopping, community activity, and yard work  PERSONAL FACTORS: Age, Time since onset of injury/illness/exacerbation, and 3+ comorbidities: PMH are also affecting patient's functional outcome.   REHAB POTENTIAL: Good  CLINICAL DECISION MAKING: Evolving/moderate complexity  EVALUATION COMPLEXITY: Moderate  PLAN:  PT FREQUENCY: 1x/week  PT  DURATION: 4 weeks including 11/20/2023 visit  PLANNED INTERVENTIONS: 97750- Physical Performance Testing, 97110-Therapeutic exercises, 97530- Therapeutic activity, 97112- Neuromuscular re-education, 97535- Self Care, [id number]- Manual therapy, [id number]- Gait training, [id number]- Canalith repositioning, [id number]- Aquatic Therapy, [phone]- Electrical stimulation (unattended), and 20560 (1-2 muscles), 20561 (3+ muscles)- Dry Needling  PLAN FOR NEXT SESSION: Updates to HEP to address strength and balance, working towards LTGs.  Greig Anon, PT 11/20/23 3:11 PM Phone: [phone] Fax: [phone]  [facility] [address], [address] [place], [place] [address] Phone # [phone] Fax # [phone]

## 2023-12-03 NOTE — Therapy (Incomplete)
 OUTPATIENT PHYSICAL THERAPY NEURO TREATMENT/RECERT   Patient Name: Terry Lyons MRN: [id number] DOB:[birth year], 78 y.o., male Today's Date: 12/03/2023   PCP: [name], MD REFERRING PROVIDER: [name], MD/// [name], MD    END OF SESSION:        Past Medical History:  Diagnosis Date   Allergy    Arthritis    CHF (congestive heart failure) (HCC)    hospitalized in 2017; was diagnosed with an acute excacerbation.    CKD stage 3a, GFR 45-59 ml/min (HCC)    Colon polyps    adenomatous   Diabetes (HCC)    Diabetes mellitus    GERD (gastroesophageal reflux disease)    Gout    History of basal cell carcinoma    W-S Derm   History of squamous cell carcinoma    Hyperlipidemia    Hypertension    Obesity    Rosacea    Tachycardia    Past Surgical History:  Procedure Laterality Date   colonoscopy with polypectomy     X3   ESOPHAGOGASTRODUODENOSCOPY N/A 03/10/2015   Procedure: ESOPHAGOGASTRODUODENOSCOPY (EGD);  Surgeon: [name], MD;  Location: [facility];  Service: Endoscopy;  Laterality: N/A;   esophagus dilated     X3   EUS N/A 05/20/2015   Procedure: UPPER ENDOSCOPIC ULTRASOUND (EUS) RADIAL;  Surgeon: [name], MD;  Location: [facility];  Service: Endoscopy;  Laterality: N/A;   KNEE ARTHROSCOPY     bilaterally   LOOP RECORDER INSERTION N/A 08/13/2023   Procedure: LOOP RECORDER INSERTION;  Surgeon: [name], MD;  Location: [facility];  Service: Cardiovascular;  Laterality: N/A;   PILONIDAL CYST EXCISION     REPLACEMENT TOTAL KNEE Right    R   SHOULDER SURGERY     right shoulder   TONSILLECTOMY     total ethmoidectomy & sphenoidectomiy Bilateral 05/27/13   also reduction of turbinates   TOTAL KNEE ARTHROPLASTY     RIGHT   TOTAL SHOULDER ARTHROPLASTY Left 10/04/2017   Procedure: LEFT TOTAL SHOULDER ARTHROPLASTY;  Surgeon: [name], MD;  Location: [facility];  Service: Orthopedics;  Laterality: Left;   Patient  Active Problem List   Diagnosis Date Noted   Class 3 obesity (HCC) 08/12/2023   Acute CVA (cerebrovascular accident) (HCC) 08/11/2023   CKD stage 3a, GFR 45-59 ml/min (HCC) 08/11/2023   Insomnia 08/11/2023   Acute metabolic encephalopathy 01/17/2021   Encephalopathy 01/16/2021   History of squamous cell carcinoma    Status post total shoulder arthroplasty 10/04/2017   Pain in left knee 06/08/2017   Osteoarthritis of glenohumeral joint 05/21/2017   Esophageal candidiasis (HCC) 03/13/2015   Acute diastolic CHF (congestive heart failure) (HCC) 03/13/2015   Sinus tachycardia 03/10/2015   Hypothyroidism 03/10/2015   Hypoxemia    Uncontrolled diabetes mellitus type 2 without complications 03/07/2015   Chest pain on breathing    Intractable nausea and vomiting 03/06/2015   Abdominal pain 03/06/2015   Severe obesity (BMI >= 40) (HCC) 05/21/2013   Chronic sphenoidal sinusitis 05/21/2013   Hypokalemia 05/21/2013   Sinusitis, chronic 12/19/2012   History of anesthesia reaction 03/06/2012   Testosterone  deficiency 03/06/2012   Type II diabetes mellitus (HCC) 12/26/2010   FATIGUE 04/26/2010   EDEMA 04/26/2010   PAROXYSMAL NOCTURNAL DYSPNEA 04/26/2010   ESOPHAGEAL STRICTURE 07/17/2008   FATTY LIVER DISEASE 07/17/2008   Esophageal dysphagia 07/17/2008   Essential hypertension, benign 07/02/2008   Angioedema 06/08/2008   UNSPECIFIED ANEMIA 05/25/2008   URTICARIA  05/25/2008   HYPONATREMIA 05/03/2008   Disorder resulting from impaired renal function 05/03/2008   Hyperlipidemia 11/05/2007   NONSPEC ELEVATION OF LEVELS OF TRANSAMINASE/LDH 11/05/2007   SKIN CANCER, HX OF 11/05/2007   GOUT, UNSPECIFIED 02/05/2007   DYSMETABOLIC SYNDROME X 02/05/2007   GERD 02/05/2007    ONSET DATE: June 2025  REFERRING DIAG:  I63.9 (ICD-10-CM) - Acute CVA (cerebrovascular accident) (HCC)    THERAPY DIAG:  No diagnosis found.  Rationale for Evaluation and Treatment: Rehabilitation  SUBJECTIVE:                                                                                                                                                                                              SUBJECTIVE STATEMENT: Still having L knee pain.  Have not had any falls, just some staggering. The knee brace is very helpful. Pt accompanied by: self   PERTINENT HISTORY: 78 y.o. M with MO, CKD, hypothyroidism, HTN and DM presented with acute vision changes to OSH. MRI brain there showed scattered subcortical (embolic) infarcts in left frontal and lateral cortex, left BG and right cerebellum. Transferred to [facility] for Neurology consultation. R TKA 15 years ago  PAIN:  Are you having pain? Yes: NPRS scale: 5-6/10 Pain location: L knee Pain description: pt did not report Aggravating factors: pt did not report Relieving factors: sleeve, tylenol    PRECAUTIONS: Fall  RED FLAGS: None   WEIGHT BEARING RESTRICTIONS: No  FALLS: Has patient fallen in last 6 months? No  LIVING ENVIRONMENT: Lives with: lives with their family Lives in: House/apartment-one-level house Stairs: 4 steps to enter Has following equipment at home: Single point cane, Environmental consultant - 4 wheeled, shower chair, and Grab bars  PLOF: Independent  PATIENT GOALS: improve balance, improve transfers  OBJECTIVE:     TODAY'S TREATMENT: 12/04/23 Activity Comments                        Reviewed pt's HEP; overall, pt reports he has not done these exercises since last at PT, due to knee pain.  TODAY'S TREATMENT: 11/20/2023 Activity Comments  Review of pt's HEP: Corner balance Romberg + EO head turns/nods Standing hip abduction x 10 (no weight) Alternating step taps  Mild instability  Good form with standing balance ex  Seated LAQ x 10 Seated march x 10 Seated hip abduction Gentle L knee flexion/extension EOM not weight 5# BLE, then no weight LLE   Standing hip extension x 10 reps             HOME EXERCISE PROGRAM: Access  Code: [email or web address] URL: [email or web address] Date: 11/20/2023  Prepared by: [facility] - Outpatient  Rehab - [facility]  Exercises - Corner Balance Feet Together: Eyes Open With Head Turns  - 1 x daily - 5 x weekly - 2 sets - 10 reps - Seated Hip Abduction with Resistance  - 1 x daily - 2-3 x weekly - 2 sets - 10 reps - Standing Hip Abduction with Counter Support  - 1 x daily - 5 x weekly - 3 sets - 10 reps - Seated Long Arc Quad with Ankle Weight  - 2-3 x weekly - 2-3 sets - 10 reps - Seated Hip Flexion March with Ankle Weights  - 2-3 x weekly - 2-3 sets - 10 reps - Standing Toe Taps  - 2-3 x weekly - 2-3 sets - 10 reps - Standing Hip Extension with Counter Support  - 1 x daily - 5 x weekly - 3 sets - 10 reps  TREATMENT: 10/16/2023 Activity Comments  FTSTS  27.53 sec with BUE support, LLE is forward  TUG:  33 sec with cane Improved from 38 sec at eval  67M walk:  18.56 (1.77 ft/sec) Cane, antalgic gait pattern               PATIENT EDUCATION: Education details: Discussed aquatic therapy and logistics/benefits/plan for this; pt ultimately decides he would rather focus on in-clinic sessions.  He doesn't think he will be able to get away from wife (who has Alzheimer's) long enough for aquatic sessions.  Discussed POC, recert today and consolidated/updated HEP.   (reprinted full updated HEP).  Discussed importance of consistent HEP performance  Person educated: Patient  Education method: Explanation Education comprehension: verbalized understanding    VESTIBULAR ASSESSMENT   GENERAL OBSERVATION: pt wears bifocals   OCULOMOTOR EXAM: Ocular Alignment: Cover/Uncover Test: WNL Cover/Cross Cover Test: WNL   Ocular ROM: No Limitations   Spontaneous Nystagmus: absent   Gaze-Induced Nystagmus: absent   Smooth Pursuits: intact   Saccades: intact   Convergence/Divergence: 16 cm d/t L eye     VESTIBULAR - OCULAR REFLEX:    Slow VOR: Normal and Comment: c/o blurriness     VOR Cancellation: Comment: segmental head movement; difficult to discern if d/t neck or visual    Head-Impulse Test: HIT Right: covert HIT Left: negative    AUDITORY SCREEN:  Rinne Test WNL B    POSITIONAL TESTING:  Sit>supine: no dizziness  Left Roll Test: negative, pt c/o nausea  C/o nausea and pt requested to sit up after positional test  155/90 mmHg 97% spO2, 65bpm    Sitting head turns to targets 20: no dizziness Sitting head nods to targets 20: no dizziness Sitting horizontal VOR 30: no dizziness; difficulty maintaining consistent amplitude  Sitting vertical VOR 30: no dizziness; c/o some blurriness in the L eye; difficulty maintaining consistent amplitude   PATIENT EDUCATION: Education details: [facility] as [facility] only have [facility] approved  Person educated: Patient Education method: Explanation Education comprehension: verbalized understanding    Note: Objective measures were completed at Evaluation unless otherwise noted.  DIAGNOSTIC FINDINGS: MRI brain showed scattered subcortical infarcts in the left frontal, lateral cortex, left basal ganglia and right cerebellum.     Non-invasive angiography showed vertebral artery stenoses without concomitant infarct.  Echocardiogram at OSH showed normal EF, no cardiogenic source.  COGNITION: Overall cognitive status: reports short term memory issues   POSTURE: rounded shoulders and forward head  LOWER EXTREMITY ROM:     Active  Right  Eval Left Eval  Hip flexion    Hip extension    Hip abduction    Hip adduction    Hip internal rotation    Hip external rotation    Knee flexion 0 -5  Knee extension    Ankle dorsiflexion 12 12  Ankle plantarflexion    Ankle inversion    Ankle eversion     (Blank rows = not tested)  LOWER EXTREMITY MMT:   BLE 4/5 gross strength to resisted tests w/ discomfort to left knee resistance  BED MOBILITY:  Not tested--reports independence w/  grab bar  TRANSFERS: Sit to stand: Complete Independence  Assistive device utilized: None     Stand to sit: Modified independence  Assistive device utilized: None     Chair to chair: Modified independence  Assistive device utilized: None       RAMP:  Not tested  CURB:  Findings: SBA-CGA  STAIRS: Findings: Comments: step-to w/ HR and supervision GAIT: Findings: Distance walked: clinic and Comments: LLE antalgic, decreased stride length/speed -Supervision level  FUNCTIONAL TESTS:  5 times sit to stand: 59 sec Timed up and go (TUG): 38 sec w/ cane 10 meter walk test: 29.78 sec = 0.75 ft/sec Berg Balance: 40/56       GOALS: Goals reviewed with patient? Yes  SHORT TERM GOALS: Target date: 09/26/2023    Patient will be independent in HEP to improve functional outcomes Baseline: Goal status: MET  2.  Demo improved BLE strength and reduced risk for falls per time 30 sec 5xSTS test Baseline: 59 sec; 16 sec Goal status: MET  3.  Demo improved mobility and reduced risk for falls by improved time to 25 sec TUG test Baseline: 38 sec w/ cane; 16.87 sec w/ NBQC Goal status: MET  4.  Report/demo improved transfers by ability to transfer from his recliner at home w/out LOB  Baseline: reports difficulty w/ instances of falling backwards  Goal status: NOT MET  LONG TERM GOALS: Target date: 10/24/2023>UPDATED 12/18/2023    Demo low risk for falls per score 49/56 Berg Balance Test to improve safety with ADL Baseline: 40/56>41/56 89/03/7972 Goal status: IN PROGRESS, 11/20/2023  2.  Demo improved mobility and reduced risk for falls by improved time to 13.5 sec TUG test Baseline: 38 sec w/ cane> 33.9 sec with cane 10/16/2023 Goal status: PARTIALLY MET, 10/16/2023  3.  Demo improved BLE strength and reduced risk for falls per time 20 sec 5xSTS test Baseline: 59 sec>27.53 sec 10/16/2023 Goal status: PARTIALLY MET, 10/16/2023  4.  Ambulation over various surfaces w/ curbs w/ least  restrictive AD w/ modified independence to improve safety in community Baseline: CGA Goal status: IN PROGRESS  5.  Increase gait speed to 2.0 ft/sec to improve efficiency of community mobility Baseline: 0.75 ft/sec w/ cane>1.77 ft/sec cane Goal status: IN PROGRESS, 10/26/2023   ASSESSMENT:  CLINICAL IMPRESSION: Pt presents today wearing L knee brace and stating that knee pain is better with knee brace on; he does report he hasn't done HEP since last PT session.  He is curious about aquatic therapy; however, he ultimately decides against it, worried that it will take too much time away from wife (with Alzheimer's).  He has not yet met LTGS (likely knee pain and being away from therapy x 1 month contributing), and PT updated/consolidated HEP this visit to help with pt's compliance to address strength and balance.  He is agreeable to trying additional therapy 1x/wk for 4 more visits, to address strength  and balance towards goals for  decreased fall risk and improved overall functional mobility.  Recert completed today.  OBJECTIVE IMPAIRMENTS: Abnormal gait, decreased activity tolerance, decreased balance, decreased endurance, decreased knowledge of use of DME, difficulty walking, decreased strength, impaired flexibility, and pain.   ACTIVITY LIMITATIONS: carrying, lifting, bending, standing, stairs, transfers, reach over head, and locomotion level  PARTICIPATION LIMITATIONS: meal prep, cleaning, laundry, interpersonal relationship, driving, shopping, community activity, and yard work  PERSONAL FACTORS: Age, Time since onset of injury/illness/exacerbation, and 3+ comorbidities: PMH are also affecting patient's functional outcome.   REHAB POTENTIAL: Good  CLINICAL DECISION MAKING: Evolving/moderate complexity  EVALUATION COMPLEXITY: Moderate  PLAN:  PT FREQUENCY: 1x/week  PT DURATION: 4 weeks including 11/20/2023 visit  PLANNED INTERVENTIONS: 97750- Physical Performance Testing,  97110-Therapeutic exercises, 97530- Therapeutic activity, 97112- Neuromuscular re-education, 97535- Self Care, [id number]- Manual therapy, [id number]- Gait training, [phone]- Canalith repositioning, [id number]- Aquatic Therapy, [phone]- Electrical stimulation (unattended), and [id number] (1-2 muscles), [id number] (3+ muscles)- Dry Needling  PLAN FOR NEXT SESSION: Updates to HEP to address strength and balance, working towards LTGs.

## 2023-12-04 ENCOUNTER — Ambulatory Visit

## 2023-12-04 DIAGNOSIS — R29898 Other symptoms and signs involving the musculoskeletal system: Secondary | ICD-10-CM

## 2023-12-04 DIAGNOSIS — R262 Difficulty in walking, not elsewhere classified: Secondary | ICD-10-CM

## 2023-12-04 DIAGNOSIS — R2681 Unsteadiness on feet: Secondary | ICD-10-CM

## 2023-12-04 DIAGNOSIS — M6281 Muscle weakness (generalized): Secondary | ICD-10-CM

## 2023-12-04 NOTE — Therapy (Signed)
 OUTPATIENT PHYSICAL THERAPY NEURO TREATMENT   Patient Name: Terry Lyons MRN: [id number] DOB:1945-06-08, 78 y.o., male Today's Date: 12/04/2023   PCP: [name], MD REFERRING PROVIDER: [name], MD/// [name], MD    END OF SESSION:  PT End of Session - 12/04/23 1100     Visit Number 12    Number of Visits 14    Date for Recertification  12/18/23    [facility]ization Type [facility]    Authorization Time Period 08/20/2023-12/18/2023    Authorization - Visit Number 12    Authorization - Number of Visits 15    Progress Note Due on Visit 10    PT Start Time 1100    PT Stop Time 1145    PT Time Calculation (min) 45 min    Equipment Utilized During Treatment Gait belt   wearing L knee brace   Activity Tolerance Patient limited by pain    Behavior During Therapy [facility] for tasks assessed/performed              Past Medical History:  Diagnosis Date   Allergy    Arthritis    CHF (congestive heart failure) (HCC)    hospitalized in 2017; was diagnosed with an acute excacerbation.    CKD stage 3a, GFR 45-59 ml/min (HCC)    Colon polyps    adenomatous   Diabetes (HCC)    Diabetes mellitus    GERD (gastroesophageal reflux disease)    Gout    History of basal cell carcinoma    W-S Derm   History of squamous cell carcinoma    Hyperlipidemia    Hypertension    Obesity    Rosacea    Tachycardia    Past Surgical History:  Procedure Laterality Date   colonoscopy with polypectomy     X3   ESOPHAGOGASTRODUODENOSCOPY N/A 03/10/2015   Procedure: ESOPHAGOGASTRODUODENOSCOPY (EGD);  Surgeon: [name], MD;  Location: [facility];  Service: Endoscopy;  Laterality: N/A;   esophagus dilated     X3   EUS N/A 05/20/2015   Procedure: UPPER ENDOSCOPIC ULTRASOUND (EUS) RADIAL;  Surgeon: [name], MD;  Location: [facility];  Service: Endoscopy;  Laterality: N/A;   KNEE ARTHROSCOPY     bilaterally   LOOP RECORDER INSERTION N/A 08/13/2023   Procedure: LOOP RECORDER  INSERTION;  Surgeon: [name], MD;  Location: [facility] INVASIVE CV LAB;  Service: Cardiovascular;  Laterality: N/A;   PILONIDAL CYST EXCISION     REPLACEMENT TOTAL KNEE Right    R   SHOULDER SURGERY     right shoulder   TONSILLECTOMY     total ethmoidectomy & sphenoidectomiy Bilateral 05/27/13   also reduction of turbinates   TOTAL KNEE ARTHROPLASTY     RIGHT   TOTAL SHOULDER ARTHROPLASTY Left 10/04/2017   Procedure: LEFT TOTAL SHOULDER ARTHROPLASTY;  Surgeon: [name], MD;  Location: [facility] OR;  Service: Orthopedics;  Laterality: Left;   Patient Active Problem List   Diagnosis Date Noted   Class 3 obesity (HCC) 08/12/2023   Acute CVA (cerebrovascular accident) (HCC) 08/11/2023   CKD stage 3a, GFR 45-59 ml/min (HCC) 08/11/2023   Insomnia 08/11/2023   Acute metabolic encephalopathy 01/17/2021   Encephalopathy 01/16/2021   History of squamous cell carcinoma    Status post total shoulder arthroplasty 10/04/2017   Pain in left knee 06/08/2017   Osteoarthritis of glenohumeral joint 05/21/2017   Esophageal candidiasis (HCC) 03/13/2015   Acute diastolic CHF (congestive heart failure) (HCC) 03/13/2015   Sinus  tachycardia 03/10/2015   Hypothyroidism 03/10/2015   Hypoxemia    Uncontrolled diabetes mellitus type 2 without complications 03/07/2015   Chest pain on breathing    Intractable nausea and vomiting 03/06/2015   Abdominal pain 03/06/2015   Severe obesity (BMI >= 40) (HCC) 05/21/2013   Chronic sphenoidal sinusitis 05/21/2013   Hypokalemia 05/21/2013   Sinusitis, chronic 12/19/2012   History of anesthesia reaction 03/06/2012   Testosterone  deficiency 03/06/2012   Type II diabetes mellitus (HCC) 12/26/2010   FATIGUE 04/26/2010   EDEMA 04/26/2010   PAROXYSMAL NOCTURNAL DYSPNEA 04/26/2010   ESOPHAGEAL STRICTURE 07/17/2008   FATTY LIVER DISEASE 07/17/2008   Esophageal dysphagia 07/17/2008   Essential hypertension, benign 07/02/2008   Angioedema 06/08/2008   UNSPECIFIED ANEMIA  05/25/2008   URTICARIA 05/25/2008   HYPONATREMIA 05/03/2008   Disorder resulting from impaired renal function 05/03/2008   Hyperlipidemia 11/05/2007   NONSPEC ELEVATION OF LEVELS OF TRANSAMINASE/LDH 11/05/2007   SKIN CANCER, HX OF 11/05/2007   GOUT, UNSPECIFIED 02/05/2007   DYSMETABOLIC SYNDROME X 02/05/2007   GERD 02/05/2007    ONSET DATE: June 2025  REFERRING DIAG:  I63.9 (ICD-10-CM) - Acute CVA (cerebrovascular accident) (HCC)    THERAPY DIAG:  Muscle weakness (generalized)  Unsteadiness on feet  Difficulty in walking, not elsewhere classified  Other symptoms and signs involving the musculoskeletal system  Rationale for Evaluation and Treatment: Rehabilitation  SUBJECTIVE:                                                                                                                                                                                             SUBJECTIVE STATEMENT: Doing ok. Intend to start going to [facility] and will try water exercise. Dizziness is largely gone, maybe 2x within 10 day period.  Pt accompanied by: self   PERTINENT HISTORY: 78 y.o. M with MO, CKD, hypothyroidism, HTN and DM presented with acute vision changes to OSH. MRI brain there showed scattered subcortical (embolic) infarcts in left frontal and lateral cortex, left BG and right cerebellum. Transferred to [facility] for Neurology consultation. R TKA 15 years ago  PAIN:  Are you having pain? Yes: NPRS scale: 4/10 Pain location: L knee Pain description: pt did not report Aggravating factors: pt did not report Relieving factors: sleeve, tylenol    PRECAUTIONS: Fall  RED FLAGS: None   WEIGHT BEARING RESTRICTIONS: No  FALLS: Has patient fallen in last 6 months? No  LIVING ENVIRONMENT: Lives with: lives with their family Lives in: House/apartment-one-level house Stairs: 4 steps to enter Has following equipment at home: Single point cane, Environmental consultant - 4 wheeled, shower chair, and Grab  bars  PLOF: Independent  PATIENT GOALS: improve balance, improve transfers  OBJECTIVE:   TODAY'S TREATMENT: 12/04/23 Activity Comments  Forwards/backwards  sidestep X 2 min  Static balance -semi-tandem -multisensory on foam -attempt w/ pad and dynadisc--too painful left knee  Dynamic balance -4-square step w/ canes 2x60 sec -4-square w/ obstacles -forwads and lateral over 6 hurdles              Reviewed pt's HEP; overall, pt reports he has not done these exercises since last at PT, due to knee pain.  TODAY'S TREATMENT: 11/20/2023 Activity Comments  Review of pt's HEP: Corner balance Romberg + EO head turns/nods Standing hip abduction x 10 (no weight) Alternating step taps  Mild instability  Good form with standing balance ex  Seated LAQ x 10 Seated march x 10 Seated hip abduction Gentle L knee flexion/extension EOM not weight 5# BLE, then no weight LLE   Standing hip extension x 10 reps             HOME EXERCISE PROGRAM: Access Code: [email or web address] URL: [email or web address] Date: 11/20/2023 Prepared by: [facility] - Outpatient  Rehab - [facility]  Exercises - Corner Balance Feet Together: Eyes Open With Head Turns  - 1 x daily - 5 x weekly - 2 sets - 10 reps - Seated Hip Abduction with Resistance  - 1 x daily - 2-3 x weekly - 2 sets - 10 reps - Standing Hip Abduction with Counter Support  - 1 x daily - 5 x weekly - 3 sets - 10 reps - Seated Long Arc Quad with Ankle Weight  - 2-3 x weekly - 2-3 sets - 10 reps - Seated Hip Flexion March with Ankle Weights  - 2-3 x weekly - 2-3 sets - 10 reps - Standing Toe Taps  - 2-3 x weekly - 2-3 sets - 10 reps - Standing Hip Extension with Counter Support  - 1 x daily - 5 x weekly - 3 sets - 10 reps  TREATMENT: 10/16/2023 Activity Comments  FTSTS  27.53 sec with BUE support, LLE is forward  TUG:  33 sec with cane Improved from 38 sec at eval  47M walk:  18.56 (1.77 ft/sec) Cane, antalgic gait pattern                PATIENT EDUCATION: Education details: Discussed aquatic therapy and logistics/benefits/plan for this; pt ultimately decides he would rather focus on in-clinic sessions.  He doesn't think he will be able to get away from wife (who has Alzheimer's) long enough for aquatic sessions.  Discussed POC, recert today and consolidated/updated HEP.   (reprinted full updated HEP).  Discussed importance of consistent HEP performance  Person educated: Patient  Education method: Explanation Education comprehension: verbalized understanding    VESTIBULAR ASSESSMENT   GENERAL OBSERVATION: pt wears bifocals   OCULOMOTOR EXAM: Ocular Alignment: Cover/Uncover Test: WNL Cover/Cross Cover Test: WNL   Ocular ROM: No Limitations   Spontaneous Nystagmus: absent   Gaze-Induced Nystagmus: absent   Smooth Pursuits: intact   Saccades: intact   Convergence/Divergence: 16 cm d/t L eye     VESTIBULAR - OCULAR REFLEX:    Slow VOR: Normal and Comment: c/o blurriness    VOR Cancellation: Comment: segmental head movement; difficult to discern if d/t neck or visual    Head-Impulse Test: HIT Right: covert HIT Left: negative    AUDITORY SCREEN:  Rinne Test WNL B    POSITIONAL TESTING:  Sit>supine: no dizziness  Left Roll Test: negative, pt c/o  nausea  C/o nausea and pt requested to sit up after positional test  155/90 mmHg 97% spO2, 65bpm    Sitting head turns to targets 20: no dizziness Sitting head nods to targets 20: no dizziness Sitting horizontal VOR 30: no dizziness; difficulty maintaining consistent amplitude  Sitting vertical VOR 30: no dizziness; c/o some blurriness in the L eye; difficulty maintaining consistent amplitude   PATIENT EDUCATION: Education details: [facility] as [facility] only have [facility] approved  Person educated: Patient Education method: Explanation Education comprehension: verbalized understanding    Note: Objective measures were  completed at Evaluation unless otherwise noted.  DIAGNOSTIC FINDINGS: MRI brain showed scattered subcortical infarcts in the left frontal, lateral cortex, left basal ganglia and right cerebellum.     Non-invasive angiography showed vertebral artery stenoses without concomitant infarct.  Echocardiogram at OSH showed normal EF, no cardiogenic source.  COGNITION: Overall cognitive status: reports short term memory issues   POSTURE: rounded shoulders and forward head  LOWER EXTREMITY ROM:     Active  Right Eval Left Eval  Hip flexion    Hip extension    Hip abduction    Hip adduction    Hip internal rotation    Hip external rotation    Knee flexion 0 -5  Knee extension    Ankle dorsiflexion 12 12  Ankle plantarflexion    Ankle inversion    Ankle eversion     (Blank rows = not tested)  LOWER EXTREMITY MMT:   BLE 4/5 gross strength to resisted tests w/ discomfort to left knee resistance  BED MOBILITY:  Not tested--reports independence w/ grab bar  TRANSFERS: Sit to stand: Complete Independence  Assistive device utilized: None     Stand to sit: Modified independence  Assistive device utilized: None     Chair to chair: Modified independence  Assistive device utilized: None       RAMP:  Not tested  CURB:  Findings: SBA-CGA  STAIRS: Findings: Comments: step-to w/ HR and supervision GAIT: Findings: Distance walked: clinic and Comments: LLE antalgic, decreased stride length/speed -Supervision level  FUNCTIONAL TESTS:  5 times sit to stand: 59 sec Timed up and go (TUG): 38 sec w/ cane 10 meter walk test: 29.78 sec = 0.75 ft/sec Berg Balance: 40/56       GOALS: Goals reviewed with patient? Yes  SHORT TERM GOALS: Target date: 09/26/2023    Patient will be independent in HEP to improve functional outcomes Baseline: Goal status: MET  2.  Demo improved BLE strength and reduced risk for falls per time 30 sec 5xSTS test Baseline: 59 sec; 16 sec Goal status:  MET  3.  Demo improved mobility and reduced risk for falls by improved time to 25 sec TUG test Baseline: 38 sec w/ cane; 16.87 sec w/ NBQC Goal status: MET  4.  Report/demo improved transfers by ability to transfer from his recliner at home w/out LOB  Baseline: reports difficulty w/ instances of falling backwards  Goal status: NOT MET  LONG TERM GOALS: Target date: 10/24/2023>UPDATED 12/18/2023    Demo low risk for falls per score 49/56 Berg Balance Test to improve safety with ADL Baseline: 40/56>41/56 89/03/7972 Goal status: IN PROGRESS, 11/20/2023  2.  Demo improved mobility and reduced risk for falls by improved time to 13.5 sec TUG test Baseline: 38 sec w/ cane> 33.9 sec with cane 10/16/2023 Goal status: PARTIALLY MET, 10/16/2023  3.  Demo improved BLE strength and reduced risk  for falls per time 20 sec 5xSTS test Baseline: 59 sec>27.53 sec 10/16/2023 Goal status: PARTIALLY MET, 10/16/2023  4.  Ambulation over various surfaces w/ curbs w/ least restrictive AD w/ modified independence to improve safety in community Baseline: CGA Goal status: IN PROGRESS  5.  Increase gait speed to 2.0 ft/sec to improve efficiency of community mobility Baseline: 0.75 ft/sec w/ cane>1.77 ft/sec cane Goal status: IN PROGRESS, 10/26/2023   ASSESSMENT:  CLINICAL IMPRESSION: Focus on static and dynamic balance to improve single limb support and negotiation of obstacles w/ cues in left quad activation/contraction when placed in LLE single limb support to improve knee stability w/ improved control and tolerance as evidenced by ability to reduce need for circumduction around obstacles and decrease in c/o knee grinding/instability w/ demands. Pt has been using an off-the-shelf hinged knee brace to good effect with some reduction in pain but still experiencing instability/locking/catching and would likely benefit from a more custom option for stabilizing knee to improve safety with transfers and gait OBJECTIVE  IMPAIRMENTS: Abnormal gait, decreased activity tolerance, decreased balance, decreased endurance, decreased knowledge of use of DME, difficulty walking, decreased strength, impaired flexibility, and pain.   ACTIVITY LIMITATIONS: carrying, lifting, bending, standing, stairs, transfers, reach over head, and locomotion level  PARTICIPATION LIMITATIONS: meal prep, cleaning, laundry, interpersonal relationship, driving, shopping, community activity, and yard work  PERSONAL FACTORS: Age, Time since onset of injury/illness/exacerbation, and 3+ comorbidities: PMH are also affecting patient's functional outcome.   REHAB POTENTIAL: Good  CLINICAL DECISION MAKING: Evolving/moderate complexity  EVALUATION COMPLEXITY: Moderate  PLAN:  PT FREQUENCY: 1x/week  PT DURATION: 4 weeks including 11/20/2023 visit  PLANNED INTERVENTIONS: 97750- Physical Performance Testing, 97110-Therapeutic exercises, 97530- Therapeutic activity, 97112- Neuromuscular re-education, 97535- Self Care, [id number]- Manual therapy, [id number]- Gait training, [phone]- Canalith repositioning, [id number]- Aquatic Therapy, [phone]- Electrical stimulation (unattended), and 20560 (1-2 muscles), 20561 (3+ muscles)- Dry Needling  PLAN FOR NEXT SESSION: Updates to HEP to address strength and balance, working towards LTGs.  11:49 AM, 12/04/23 M. Kelly Devere Brem, PT, DPT Physical Therapist- [place] Office Number: [phone]

## 2023-12-11 ENCOUNTER — Ambulatory Visit

## 2023-12-14 ENCOUNTER — Ambulatory Visit

## 2023-12-17 ENCOUNTER — Encounter

## 2023-12-17 ENCOUNTER — Ambulatory Visit

## 2023-12-17 DIAGNOSIS — I639 Cerebral infarction, unspecified: Secondary | ICD-10-CM | POA: Diagnosis not present

## 2023-12-17 LAB — CUP PACEART REMOTE DEVICE CHECK
Date Time Interrogation Session: 20251102235552
Implantable Pulse Generator Implant Date: 20250630

## 2023-12-18 ENCOUNTER — Ambulatory Visit: Attending: Family Medicine

## 2023-12-19 ENCOUNTER — Ambulatory Visit: Payer: Self-pay | Admitting: Cardiology

## 2023-12-20 NOTE — Progress Notes (Signed)
 Remote Loop Recorder Transmission

## 2024-01-17 ENCOUNTER — Encounter

## 2024-01-17 ENCOUNTER — Ambulatory Visit

## 2024-01-19 ENCOUNTER — Ambulatory Visit: Attending: Cardiology

## 2024-01-21 LAB — CUP PACEART REMOTE DEVICE CHECK
Date Time Interrogation Session: 20251205233025
Implantable Pulse Generator Implant Date: 20250630

## 2024-01-23 NOTE — Progress Notes (Signed)
 Remote Loop Recorder Transmission

## 2024-02-01 ENCOUNTER — Ambulatory Visit: Payer: Self-pay | Admitting: Cardiology

## 2024-02-17 ENCOUNTER — Ambulatory Visit

## 2024-02-18 ENCOUNTER — Encounter

## 2024-02-19 ENCOUNTER — Ambulatory Visit

## 2024-02-19 DIAGNOSIS — I639 Cerebral infarction, unspecified: Secondary | ICD-10-CM

## 2024-02-19 LAB — CUP PACEART REMOTE DEVICE CHECK
Date Time Interrogation Session: 20260105234145
Implantable Pulse Generator Implant Date: 20250630

## 2024-02-21 NOTE — Progress Notes (Signed)
 Remote Loop Recorder Transmission

## 2024-02-23 ENCOUNTER — Ambulatory Visit: Payer: Self-pay | Admitting: Cardiology

## 2024-03-19 ENCOUNTER — Ambulatory Visit

## 2024-03-21 ENCOUNTER — Ambulatory Visit

## 2024-03-21 LAB — CUP PACEART REMOTE DEVICE CHECK
Date Time Interrogation Session: 20260205234008
Implantable Pulse Generator Implant Date: 20250630

## 2024-04-19 ENCOUNTER — Ambulatory Visit

## 2024-04-21 ENCOUNTER — Ambulatory Visit
